# Patient Record
Sex: Male | Born: 1955
Health system: Southern US, Community
[De-identification: ages and names within clinical notes are randomized; demographics above are authoritative.]

## PROBLEM LIST (undated history)

## (undated) DIAGNOSIS — D573 Sickle-cell trait: Secondary | ICD-10-CM

## (undated) DIAGNOSIS — E7800 Pure hypercholesterolemia, unspecified: Secondary | ICD-10-CM

## (undated) DIAGNOSIS — C760 Malignant neoplasm of head, face and neck: Secondary | ICD-10-CM

## (undated) DIAGNOSIS — M21169 Varus deformity, not elsewhere classified, unspecified knee: Secondary | ICD-10-CM

## (undated) DIAGNOSIS — M1710 Unilateral primary osteoarthritis, unspecified knee: Secondary | ICD-10-CM

## (undated) DIAGNOSIS — Z720 Tobacco use: Secondary | ICD-10-CM

## (undated) DIAGNOSIS — I10 Essential (primary) hypertension: Secondary | ICD-10-CM

## (undated) DIAGNOSIS — M179 Osteoarthritis of knee, unspecified: Secondary | ICD-10-CM

## (undated) DIAGNOSIS — M1990 Unspecified osteoarthritis, unspecified site: Secondary | ICD-10-CM

## (undated) DIAGNOSIS — K625 Hemorrhage of anus and rectum: Secondary | ICD-10-CM

## (undated) DIAGNOSIS — Z923 Personal history of irradiation: Secondary | ICD-10-CM

## (undated) DIAGNOSIS — T7840XA Allergy, unspecified, initial encounter: Secondary | ICD-10-CM

## (undated) DIAGNOSIS — J449 Chronic obstructive pulmonary disease, unspecified: Secondary | ICD-10-CM

## (undated) DIAGNOSIS — K635 Polyp of colon: Secondary | ICD-10-CM

## (undated) DIAGNOSIS — K219 Gastro-esophageal reflux disease without esophagitis: Secondary | ICD-10-CM

## (undated) DIAGNOSIS — K621 Rectal polyp: Secondary | ICD-10-CM

## (undated) DIAGNOSIS — R05 Cough: Secondary | ICD-10-CM

## (undated) DIAGNOSIS — J329 Chronic sinusitis, unspecified: Secondary | ICD-10-CM

## (undated) DIAGNOSIS — R053 Chronic cough: Secondary | ICD-10-CM

## (undated) HISTORY — DX: Rectal polyp: K62.1

## (undated) HISTORY — DX: Cough: R05

## (undated) HISTORY — DX: Gastro-esophageal reflux disease without esophagitis: K21.9

## (undated) HISTORY — DX: Unilateral primary osteoarthritis, unspecified knee: M17.10

## (undated) HISTORY — DX: Polyp of colon: K63.5

## (undated) HISTORY — DX: Chronic obstructive pulmonary disease, unspecified: J44.9

## (undated) HISTORY — PX: COLONOSCOPY W/ BIOPSIES AND POLYPECTOMY: SHX1376

## (undated) HISTORY — DX: Allergy, unspecified, initial encounter: T78.40XA

## (undated) HISTORY — DX: Varus deformity, not elsewhere classified, unspecified knee: M21.169

## (undated) HISTORY — DX: Unspecified osteoarthritis, unspecified site: M19.90

## (undated) HISTORY — DX: Hemorrhage of anus and rectum: K62.5

## (undated) HISTORY — DX: Osteoarthritis of knee, unspecified: M17.9

## (undated) HISTORY — DX: Chronic cough: R05.3

## (undated) HISTORY — PX: MULTIPLE TOOTH EXTRACTIONS: SHX2053

## (undated) HISTORY — DX: Essential (primary) hypertension: I10

## (undated) HISTORY — DX: Tobacco use: Z72.0

---

## 2003-02-08 ENCOUNTER — Emergency Department (HOSPITAL_COMMUNITY): Admission: EM | Admit: 2003-02-08 | Discharge: 2003-02-08 | Payer: Self-pay | Admitting: Emergency Medicine

## 2003-03-31 ENCOUNTER — Ambulatory Visit (HOSPITAL_COMMUNITY): Admission: RE | Admit: 2003-03-31 | Discharge: 2003-03-31 | Payer: Self-pay | Admitting: Internal Medicine

## 2003-03-31 ENCOUNTER — Encounter: Admission: RE | Admit: 2003-03-31 | Discharge: 2003-03-31 | Payer: Self-pay | Admitting: Internal Medicine

## 2003-04-14 ENCOUNTER — Encounter: Admission: RE | Admit: 2003-04-14 | Discharge: 2003-04-14 | Payer: Self-pay | Admitting: Internal Medicine

## 2003-06-12 ENCOUNTER — Encounter: Admission: RE | Admit: 2003-06-12 | Discharge: 2003-06-12 | Payer: Self-pay | Admitting: Internal Medicine

## 2003-06-12 ENCOUNTER — Ambulatory Visit (HOSPITAL_COMMUNITY): Admission: RE | Admit: 2003-06-12 | Discharge: 2003-06-12 | Payer: Self-pay | Admitting: Internal Medicine

## 2003-06-19 ENCOUNTER — Encounter: Admission: RE | Admit: 2003-06-19 | Discharge: 2003-06-19 | Payer: Self-pay | Admitting: Internal Medicine

## 2005-06-23 ENCOUNTER — Emergency Department (HOSPITAL_COMMUNITY): Admission: EM | Admit: 2005-06-23 | Discharge: 2005-06-23 | Payer: Self-pay | Admitting: Emergency Medicine

## 2005-08-08 ENCOUNTER — Ambulatory Visit: Payer: Self-pay | Admitting: Internal Medicine

## 2005-08-14 ENCOUNTER — Ambulatory Visit: Admission: RE | Admit: 2005-08-14 | Discharge: 2005-08-14 | Payer: Self-pay | Admitting: Hospitalist

## 2005-08-14 ENCOUNTER — Ambulatory Visit: Payer: Self-pay | Admitting: Internal Medicine

## 2005-08-28 ENCOUNTER — Ambulatory Visit: Payer: Self-pay | Admitting: Internal Medicine

## 2005-12-28 LAB — CONVERTED CEMR LAB
HDL: 27 mg/dL
LDL Cholesterol: 124 mg/dL

## 2006-01-11 DIAGNOSIS — K219 Gastro-esophageal reflux disease without esophagitis: Secondary | ICD-10-CM

## 2006-01-11 DIAGNOSIS — IMO0002 Reserved for concepts with insufficient information to code with codable children: Secondary | ICD-10-CM | POA: Insufficient documentation

## 2006-01-11 DIAGNOSIS — M1710 Unilateral primary osteoarthritis, unspecified knee: Secondary | ICD-10-CM

## 2006-01-11 DIAGNOSIS — I10 Essential (primary) hypertension: Secondary | ICD-10-CM | POA: Insufficient documentation

## 2006-01-12 ENCOUNTER — Ambulatory Visit: Payer: Self-pay | Admitting: Internal Medicine

## 2006-01-19 DIAGNOSIS — J309 Allergic rhinitis, unspecified: Secondary | ICD-10-CM

## 2006-01-19 DIAGNOSIS — J4489 Other specified chronic obstructive pulmonary disease: Secondary | ICD-10-CM | POA: Insufficient documentation

## 2006-01-19 DIAGNOSIS — J449 Chronic obstructive pulmonary disease, unspecified: Secondary | ICD-10-CM

## 2006-01-19 DIAGNOSIS — K625 Hemorrhage of anus and rectum: Secondary | ICD-10-CM

## 2006-02-01 LAB — HM COLONOSCOPY

## 2006-03-14 ENCOUNTER — Encounter (INDEPENDENT_AMBULATORY_CARE_PROVIDER_SITE_OTHER): Payer: Self-pay | Admitting: Internal Medicine

## 2006-03-14 ENCOUNTER — Ambulatory Visit: Payer: Self-pay | Admitting: Internal Medicine

## 2006-03-14 DIAGNOSIS — K2990 Gastroduodenitis, unspecified, without bleeding: Secondary | ICD-10-CM

## 2006-03-14 DIAGNOSIS — D126 Benign neoplasm of colon, unspecified: Secondary | ICD-10-CM

## 2006-03-14 DIAGNOSIS — K2970 Gastritis, unspecified, without bleeding: Secondary | ICD-10-CM | POA: Insufficient documentation

## 2006-03-14 DIAGNOSIS — R0789 Other chest pain: Secondary | ICD-10-CM

## 2006-03-14 DIAGNOSIS — K089 Disorder of teeth and supporting structures, unspecified: Secondary | ICD-10-CM | POA: Insufficient documentation

## 2006-03-14 LAB — CONVERTED CEMR LAB
ALT: 12 units/L (ref 0–53)
AST: 17 units/L (ref 0–37)
Albumin: 4.7 g/dL (ref 3.5–5.2)
Alkaline Phosphatase: 67 units/L (ref 39–117)
BUN: 9 mg/dL (ref 6–23)
CO2: 25 meq/L (ref 19–32)
Calcium: 9.5 mg/dL (ref 8.4–10.5)
Chloride: 101 meq/L (ref 96–112)
Creatinine, Ser: 0.89 mg/dL (ref 0.40–1.50)
Glucose, Bld: 89 mg/dL (ref 70–99)
Lipase: 23 units/L (ref 0–75)
Potassium: 3.6 meq/L (ref 3.5–5.3)
Sodium: 141 meq/L (ref 135–145)
Total Bilirubin: 0.7 mg/dL (ref 0.3–1.2)
Total Protein: 7.8 g/dL (ref 6.0–8.3)

## 2006-03-21 ENCOUNTER — Ambulatory Visit: Payer: Self-pay | Admitting: Internal Medicine

## 2006-03-21 ENCOUNTER — Encounter (INDEPENDENT_AMBULATORY_CARE_PROVIDER_SITE_OTHER): Payer: Self-pay | Admitting: *Deleted

## 2006-03-21 LAB — CONVERTED CEMR LAB
BUN: 9 mg/dL (ref 6–23)
CO2: 26 meq/L (ref 19–32)
Calcium: 9.4 mg/dL (ref 8.4–10.5)
Chloride: 99 meq/L (ref 96–112)
Creatinine, Ser: 0.84 mg/dL (ref 0.40–1.50)
Glucose, Bld: 107 mg/dL — ABNORMAL HIGH (ref 70–99)
Potassium: 3.8 meq/L (ref 3.5–5.3)
Sodium: 140 meq/L (ref 135–145)

## 2006-03-27 ENCOUNTER — Encounter (INDEPENDENT_AMBULATORY_CARE_PROVIDER_SITE_OTHER): Payer: Self-pay | Admitting: Internal Medicine

## 2006-05-07 ENCOUNTER — Ambulatory Visit: Payer: Self-pay | Admitting: Internal Medicine

## 2006-05-07 DIAGNOSIS — F17200 Nicotine dependence, unspecified, uncomplicated: Secondary | ICD-10-CM | POA: Insufficient documentation

## 2006-07-31 ENCOUNTER — Encounter (INDEPENDENT_AMBULATORY_CARE_PROVIDER_SITE_OTHER): Payer: Self-pay | Admitting: Internal Medicine

## 2006-08-27 ENCOUNTER — Ambulatory Visit: Payer: Self-pay | Admitting: Hospitalist

## 2006-08-27 ENCOUNTER — Encounter (INDEPENDENT_AMBULATORY_CARE_PROVIDER_SITE_OTHER): Payer: Self-pay | Admitting: Internal Medicine

## 2006-08-27 LAB — CONVERTED CEMR LAB
BUN: 10 mg/dL (ref 6–23)
CO2: 24 meq/L (ref 19–32)
Calcium: 9.7 mg/dL (ref 8.4–10.5)
Chloride: 102 meq/L (ref 96–112)
Creatinine, Ser: 0.91 mg/dL (ref 0.40–1.50)
Glucose, Bld: 85 mg/dL (ref 70–99)
Potassium: 4.2 meq/L (ref 3.5–5.3)
Sodium: 139 meq/L (ref 135–145)

## 2006-11-29 ENCOUNTER — Ambulatory Visit: Payer: Self-pay | Admitting: Hospitalist

## 2006-11-29 ENCOUNTER — Encounter (INDEPENDENT_AMBULATORY_CARE_PROVIDER_SITE_OTHER): Payer: Self-pay | Admitting: Internal Medicine

## 2006-12-03 LAB — CONVERTED CEMR LAB
BUN: 7 mg/dL (ref 6–23)
CO2: 23 meq/L (ref 19–32)
Calcium: 9.2 mg/dL (ref 8.4–10.5)
Chloride: 106 meq/L (ref 96–112)
Creatinine, Ser: 0.83 mg/dL (ref 0.40–1.50)
Glucose, Bld: 98 mg/dL (ref 70–99)
HCT: 38.5 % — ABNORMAL LOW (ref 39.0–52.0)
Hemoglobin: 12.9 g/dL — ABNORMAL LOW (ref 13.0–17.0)
MCHC: 33.5 g/dL (ref 30.0–36.0)
MCV: 83.7 fL (ref 78.0–100.0)
Platelets: 282 10*3/uL (ref 150–400)
Potassium: 4.1 meq/L (ref 3.5–5.3)
RBC: 4.6 M/uL (ref 4.22–5.81)
RDW: 16.2 % — ABNORMAL HIGH (ref 11.5–14.0)
Sodium: 142 meq/L (ref 135–145)
WBC: 5.2 10*3/uL (ref 4.0–10.5)

## 2006-12-21 ENCOUNTER — Encounter (INDEPENDENT_AMBULATORY_CARE_PROVIDER_SITE_OTHER): Payer: Self-pay | Admitting: Internal Medicine

## 2006-12-21 ENCOUNTER — Ambulatory Visit: Payer: Self-pay | Admitting: *Deleted

## 2006-12-21 LAB — CONVERTED CEMR LAB
BUN: 11 mg/dL (ref 6–23)
CO2: 26 meq/L (ref 19–32)
Calcium: 10.1 mg/dL (ref 8.4–10.5)
Chloride: 104 meq/L (ref 96–112)
Creatinine, Ser: 0.97 mg/dL (ref 0.40–1.50)
Glucose, Bld: 96 mg/dL (ref 70–99)
Potassium: 3.9 meq/L (ref 3.5–5.3)
Sodium: 143 meq/L (ref 135–145)

## 2007-03-15 ENCOUNTER — Ambulatory Visit: Payer: Self-pay | Admitting: Infectious Disease

## 2007-03-15 ENCOUNTER — Encounter (INDEPENDENT_AMBULATORY_CARE_PROVIDER_SITE_OTHER): Payer: Self-pay | Admitting: Internal Medicine

## 2007-03-18 LAB — CONVERTED CEMR LAB
Creatinine, Urine: 105.6 mg/dL
Microalb Creat Ratio: 4.5 mg/g (ref 0.0–30.0)
Microalb, Ur: 0.48 mg/dL (ref 0.00–1.89)

## 2007-03-19 ENCOUNTER — Telehealth: Payer: Self-pay | Admitting: Licensed Clinical Social Worker

## 2007-05-10 ENCOUNTER — Ambulatory Visit: Payer: Self-pay | Admitting: Hospitalist

## 2007-05-10 ENCOUNTER — Encounter (INDEPENDENT_AMBULATORY_CARE_PROVIDER_SITE_OTHER): Payer: Self-pay | Admitting: Internal Medicine

## 2007-05-13 ENCOUNTER — Encounter: Payer: Self-pay | Admitting: Licensed Clinical Social Worker

## 2007-05-28 ENCOUNTER — Encounter (INDEPENDENT_AMBULATORY_CARE_PROVIDER_SITE_OTHER): Payer: Self-pay | Admitting: Internal Medicine

## 2007-05-28 ENCOUNTER — Ambulatory Visit: Payer: Self-pay | Admitting: Internal Medicine

## 2007-05-28 LAB — CONVERTED CEMR LAB
BUN: 9 mg/dL (ref 6–23)
CO2: 31 meq/L (ref 19–32)
Calcium: 9.6 mg/dL (ref 8.4–10.5)
Chloride: 99 meq/L (ref 96–112)
Creatinine, Ser: 0.91 mg/dL (ref 0.40–1.50)
Glucose, Bld: 89 mg/dL (ref 70–99)
Potassium: 3.8 meq/L (ref 3.5–5.3)
Sodium: 138 meq/L (ref 135–145)

## 2007-06-20 ENCOUNTER — Ambulatory Visit: Payer: Self-pay | Admitting: Infectious Disease

## 2008-11-03 ENCOUNTER — Ambulatory Visit: Payer: Self-pay | Admitting: Internal Medicine

## 2008-11-03 ENCOUNTER — Encounter: Payer: Self-pay | Admitting: Internal Medicine

## 2008-11-03 DIAGNOSIS — L02828 Furuncle of other sites: Secondary | ICD-10-CM

## 2008-11-03 DIAGNOSIS — L02838 Carbuncle of other sites: Secondary | ICD-10-CM

## 2008-11-03 LAB — CONVERTED CEMR LAB
BUN: 8 mg/dL (ref 6–23)
CO2: 27 meq/L (ref 19–32)
Calcium: 9.5 mg/dL (ref 8.4–10.5)
Chloride: 100 meq/L (ref 96–112)
Cholesterol: 200 mg/dL (ref 0–200)
Creatinine, Ser: 1.07 mg/dL (ref 0.40–1.50)
Glucose, Bld: 90 mg/dL (ref 70–99)
HDL: 68 mg/dL (ref >39)
LDL Cholesterol: 116 mg/dL — ABNORMAL HIGH (ref 0–99)
Potassium: 3.6 meq/L (ref 3.5–5.3)
Sodium: 139 meq/L (ref 135–145)
Total CHOL/HDL Ratio: 2.9
Triglycerides: 81 mg/dL (ref <150)
VLDL: 16 mg/dL (ref 0–40)

## 2008-11-30 ENCOUNTER — Telehealth (INDEPENDENT_AMBULATORY_CARE_PROVIDER_SITE_OTHER): Payer: Self-pay | Admitting: *Deleted

## 2009-06-02 ENCOUNTER — Ambulatory Visit: Payer: Self-pay | Admitting: Internal Medicine

## 2009-06-02 LAB — CONVERTED CEMR LAB

## 2009-09-02 ENCOUNTER — Telehealth: Payer: Self-pay | Admitting: Internal Medicine

## 2009-12-24 ENCOUNTER — Ambulatory Visit: Payer: Self-pay | Admitting: Internal Medicine

## 2009-12-24 DIAGNOSIS — M1990 Unspecified osteoarthritis, unspecified site: Secondary | ICD-10-CM | POA: Insufficient documentation

## 2010-02-24 LAB — CONVERTED CEMR LAB
BUN: 12 mg/dL (ref 6–23)
CO2: 27 meq/L (ref 19–32)
Calcium: 9.4 mg/dL (ref 8.4–10.5)
Chloride: 99 meq/L (ref 96–112)
Cholesterol: 240 mg/dL — ABNORMAL HIGH (ref 0–200)
Creatinine, Ser: 1.12 mg/dL (ref 0.40–1.50)
Glucose, Bld: 91 mg/dL (ref 70–99)
HDL: 61 mg/dL (ref >39)
LDL Cholesterol: 147 mg/dL — ABNORMAL HIGH (ref 0–99)
Potassium: 4 meq/L (ref 3.5–5.3)
Sodium: 138 meq/L (ref 135–145)
Total CHOL/HDL Ratio: 3.9
Triglycerides: 161 mg/dL — ABNORMAL HIGH (ref <150)
VLDL: 32 mg/dL (ref 0–40)

## 2010-02-25 ENCOUNTER — Ambulatory Visit
Admission: RE | Admit: 2010-02-25 | Discharge: 2010-02-25 | Payer: Self-pay | Source: Home / Self Care | Attending: Internal Medicine | Admitting: Internal Medicine

## 2010-03-01 ENCOUNTER — Telehealth: Payer: Self-pay | Admitting: *Deleted

## 2010-03-15 NOTE — Assessment & Plan Note (Signed)
Summary: est-ck/fu/meds/cfb   Vital Signs:  Patient profile:   55 year old male Height:      63.25 inches Weight:      180.3 pounds BMI:     31.80 Temp:     97.2 degrees F oral Pulse rate:   88 / minute BP sitting:   129 / 88  (right arm)  Vitals Entered By: Filomena Jungling NT II (December 24, 2009 9:39 AM) CC: FOLLOWUPVISIT Is Patient Diabetic? No Pain Assessment Patient in pain? yes     Location: bilaterleg pain Intensity: 9-10 Type: aching Nutritional Status BMI of > 30 = obese  Have you ever been in a relationship where you felt threatened, hurt or afraid?No   Does patient need assistance? Functional Status Self care Ambulation Impaired:Risk for fall Comments WALKS WITH CANE   Primary Care Provider:  Jackson Latino MD  CC:  FOLLOWUPVISIT.  History of Present Illness: Pt is a 55 y/o with PMH of Hypertension, OA, GERD comes in for regular f/u. He has bilater knee pain, 5/10, worse in his right knee, bracing helps. But tylenol seems not very helpful. No diarrhea or dysuria, melena, hematochezia.  Current smoker, about 1 pack per week, no recent drink or drug use.   Preventive Screening-Counseling & Management  Alcohol-Tobacco     Alcohol drinks/day: <1     Alcohol type: beer     Smoking Status: current     Smoking Cessation Counseling: yes     Packs/Day: 1pk/wk     Year Started: 1976  Caffeine-Diet-Exercise     Does Patient Exercise: no  Problems Prior to Update: 1)  Carbuncle and Furuncle of Other Specified Sites  (ICD-680.8) 2)  Preventive Health Care  (ICD-V70.0) 3)  Inadequate Material Resources  (ICD-V60.2) 4)  Tobacco Abuse  (ICD-305.1) 5)  Abdominal Tenderness  (ICD-789.60) 6)  Chest Pain, Non-cardiac  (ICD-786.59) 7)  Dental Disorder  (ICD-525.9) 8)  Colonic Polyps  (ICD-211.3) 9)  Gastritis  (ICD-535.50) 10)  Rectal Bleeding  (ICD-569.3) 11)  Dysphagia, Unspecified  (ICD-787.20) 12)  COPD  (ICD-496) 13)  Allergic Rhinitis  (ICD-477.9) 14)   Degenerative Joint Disease, Knees, Bilateral  (ICD-715.96) 15)  Cough, Chronic  (ICD-786.2) 16)  Hypertension  (ICD-401.9) 17)  Gerd  (ICD-530.81)  Medications Prior to Update: 1)  Hydrochlorothiazide 25 Mg Tabs (Hydrochlorothiazide) .... Take 1 Tablet By Mouth Once A Day 2)  Omeprazole 20 Mg  Tbec (Omeprazole) .... Take 1 Tablet By Mouth Once A Day 3)  Proair Hfa 108 (90 Base) Mcg/act  Aers (Albuterol Sulfate) .... Take 1-2 Puffs Every 4-6 Hours As Needed For Shortness of Breath 4)  Claritin-D 24 Hour 10-240 Mg  Tb24 (Loratadine-Pseudoephedrine) .... Take 1 Tablet By Mouth Once A Day 5)  Lisinopril 20 Mg Tabs (Lisinopril) .... Take 1 Tablet By Mouth Once A Day  Current Medications (verified): 1)  Hydrochlorothiazide 25 Mg Tabs (Hydrochlorothiazide) .... Take 1 Tablet By Mouth Once A Day 2)  Omeprazole 20 Mg  Tbec (Omeprazole) .... Take 1 Tablet By Mouth Once A Day 3)  Proair Hfa 108 (90 Base) Mcg/act  Aers (Albuterol Sulfate) .... Take 1-2 Puffs Every 4-6 Hours As Needed For Shortness of Breath 4)  Claritin-D 24 Hour 10-240 Mg  Tb24 (Loratadine-Pseudoephedrine) .... Take 1 Tablet By Mouth Once A Day 5)  Lisinopril 20 Mg Tabs (Lisinopril) .... Take 1 Tablet By Mouth Once A Day  Allergies (verified): No Known Drug Allergies  Past History:  Past Medical History: Last updated: 03/15/2007  GERD Hypertension Chronic cough DJD- of knees bilaterally Allergic rhinitis Rectal bleeding - colonoscopy in 12/07 COPD Dysphagia -Normal EGD except for mild gastritis in 12/07 rectal polyp 12/07 with path showing it to be benign, though with a question that it was an incomplete biopsy.  Next scheduled colonoscopy 2010.  Social History: Reviewed history from 05/10/2007 and no changes required. Current Smoker - trying to quit Alcohol use-yes Drug use-no Occupation: unemployed, currently awaiting disability (3/09)  Review of Systems  The patient denies fever, chest pain, syncope, dyspnea on  exertion, peripheral edema, prolonged cough, abdominal pain, melena, and hematochezia.    Physical Exam  General:  alert, well-developed, well-nourished, well-hydrated, and overweight-appearing.   Nose:  no nasal discharge.   Mouth:  pharynx pink and moist.   Neck:  supple.   Lungs:  normal respiratory effort, normal breath sounds, no crackles, and no wheezes.   Heart:  normal rate, no murmur, and no JVD.   Abdomen:  soft, non-tender, normal bowel sounds, and no distention.   Msk:  normal ROM, no joint swelling, and no joint warmth. Both knee slighly tenderness, particularly  right knee.  Pulses:  2+ Extremities:  No edema.  Neurologic:  alert & oriented X3, cranial nerves II-XII intact, strength normal in all extremities, and gait normal.     Impression & Recommendations:  Problem # 1:  OSTEOARTHRITIS (ICD-715.90) Assessment Improved His knee pain is better with bracing and tylenol not very helpful. So will try low dose of naproxen.  Also advise exercise and heating/icy pad.   His updated medication list for this problem includes:    Naproxen 250 Mg Tab (Naproxen) .Marland Kitchen... Take one (1) tablet by mouth three (3) times a day with food as needed for your knee pain  Problem # 2:  HYPERTENSION (ICD-401.9) Assessment: Improved BP at goal and continue current meds and refill his lisinopril.  His updated medication list for this problem includes:    Hydrochlorothiazide 25 Mg Tabs (Hydrochlorothiazide) .Marland Kitchen... Take 1 tablet by mouth once a day    Lisinopril 20 Mg Tabs (Lisinopril) .Marland Kitchen... Take 1 tablet by mouth once a day  BP today: 129/88 Prior BP: 138/88 (06/02/2009)  Labs Reviewed: K+: 3.6 (11/03/2008) Creat: : 1.07 (11/03/2008)   Chol: 200 (11/03/2008)   HDL: 68 (11/03/2008)   LDL: 116 (11/03/2008)   TG: 81 (11/03/2008)  Problem # 3:  TOBACCO ABUSE (ICD-305.1) Assessment: Comment Only  Encouraged smoking cessation and discussed different methods for smoking cessation. He  would like  to cut gradually.   Problem # 4:  COPD (ICD-496) Assessment: Unchanged No SOB and use albuterol occasionally. No flare.  His updated medication list for this problem includes:    Proair Hfa 108 (90 Base) Mcg/act Aers (Albuterol sulfate) .Marland Kitchen... Take 1-2 puffs every 4-6 hours as needed for shortness of breath  Vaccines Reviewed: Flu Vax: Fluvax Non-MCR (12/21/2006)  Complete Medication List: 1)  Hydrochlorothiazide 25 Mg Tabs (Hydrochlorothiazide) .... Take 1 tablet by mouth once a day 2)  Omeprazole 20 Mg Tbec (Omeprazole) .... Take 1 tablet by mouth once a day 3)  Proair Hfa 108 (90 Base) Mcg/act Aers (Albuterol sulfate) .... Take 1-2 puffs every 4-6 hours as needed for shortness of breath 4)  Claritin-d 24 Hour 10-240 Mg Tb24 (Loratadine-pseudoephedrine) .... Take 1 tablet by mouth once a day 5)  Lisinopril 20 Mg Tabs (Lisinopril) .... Take 1 tablet by mouth once a day 6)  Naproxen 250 Mg Tab (Naproxen) .... Take one (1) tablet  by mouth three (3) times a day with food as needed for your knee pain  Other Orders: Influenza Vaccine MCR (16109)  Patient Instructions: 1)  Please schedule a follow-up appointment in 3-4 months. 2)  Tobacco is very bad for your health and your loved ones! You Should stop smoking!. 3)  Stop Smoking Tips: Choose a Quit date. Cut down before the Quit date. decide what you will do as a substitute when you feel the urge to smoke(gum,toothpick,exercise). 4)  It is important that you exercise regularly at least 20 minutes 5 times a week. If you develop chest pain, have severe difficulty breathing, or feel very tired , stop exercising immediately and seek medical attention. Prescriptions: LISINOPRIL 20 MG TABS (LISINOPRIL) Take 1 tablet by mouth once a day  #30 Tablet x 5   Entered and Authorized by:   Jackson Latino MD   Signed by:   Jackson Latino MD on 12/24/2009   Method used:   [facility] to        [facility]  [address] [phone]* (retail)       [address]       [place], [place]  [id number]       Ph: [phone] or [phone]       Fax: [phone]   RxID:   [id number] NAPROXEN 250 MG TAB (NAPROXEN) Take one (1) tablet by mouth three (3) times a day with food as needed for your knee pain  #60 x 2   Entered and Authorized by:   Jackson Latino MD   Signed by:   Jackson Latino MD on 12/24/2009   Method used:   [facility]        [facility]  [address] [phone]* (retail)       [address]       [place], [place]  [id number]       Ph: [phone] or [phone]       Fax: [phone]   RxID:   [id number]    Orders Added: 1)  Est. Patient Level III [51761] 2)  Influenza Vaccine MCR [00025]   Immunizations Administered:  Influenza Vaccine # 1:    Vaccine Type: Fluvax MCR    Site: right deltoid    Mfr: GlaxoSmithKline    Dose: 0.5 ml    Route: IM    Given by: Angelina Ok RN    Exp. Date: 08/13/2010    Lot #: [id number]    VIS given: 09/07/09 version given December 24, 2009.  Flu Vaccine Consent Questions:    Do you have a history of severe allergic reactions to this vaccine? no    Any prior history of allergic reactions to egg and/or gelatin? no    Do you have a sensitivity to the preservative Thimersol? no    Do you have a past history of Guillan-Barre Syndrome? no    Do you currently have an acute febrile illness? no    Have you ever had a severe reaction to latex? no    Vaccine information given and explained to patient? yes   Immunizations Administered:  Influenza Vaccine # 1:    Vaccine Type: Fluvax MCR    Site: right deltoid    Mfr: GlaxoSmithKline    Dose: 0.5 ml    Route: IM    Given by: Angelina Ok RN    Exp. Date: 08/13/2010    Lot #:  [id number]    VIS given: 09/07/09 version given December 24, 2009.  Prevention & Chronic Care Immunizations   Influenza vaccine: Fluvax MCR  (12/24/2009)   Influenza vaccine deferral: Not  indicated  (06/02/2009)    Tetanus booster: Not documented   Td booster deferral: Not indicated  (06/02/2009)    Pneumococcal vaccine: Not documented  Colorectal Screening   Hemoccult: Not documented   Hemoccult action/deferral: Refused  (12/24/2009)    Colonoscopy: Adenomatous Polyp  (02/01/2006)   Colonoscopy due: 02/02/2011  Other Screening   PSA: Not documented   PSA action/deferral: Discussed-PSA declined  (06/02/2009)   Smoking status: current  (12/24/2009)   Smoking cessation counseling: yes  (12/24/2009)  Lipids   Total Cholesterol: 200  (11/03/2008)   Lipid panel action/deferral: Lipid Panel ordered   LDL: 116  (11/03/2008)   LDL Direct: Not documented   HDL: 68  (11/03/2008)   Triglycerides: 81  (11/03/2008)  Hypertension   Last Blood Pressure: 129 / 88  (12/24/2009)   Serum creatinine: 1.07  (11/03/2008)   BMP action: Ordered   Serum potassium 3.6  (11/03/2008)    Hypertension flowsheet reviewed?: Yes   Progress toward BP goal: Improved  Self-Management Support :   Personal Goals (by the next clinic visit) :      Personal blood pressure goal: 140/90  (11/03/2008)   Patient will work on the following items until the next clinic visit to reach self-care goals:     Medications and monitoring: take my medicines every day  (12/24/2009)     Eating: eat more vegetables, use fresh or frozen vegetables, eat foods that are low in salt, eat baked foods instead of fried foods  (12/24/2009)     Activity: take a 30 minute walk every day  (12/24/2009)    Hypertension self-management support: Education handout  (12/24/2009)   Hypertension education handout printed   Nursing Instructions: Give Flu vaccine today

## 2010-03-15 NOTE — Progress Notes (Signed)
Summary: med refill/gp  Phone Note Refill Request Message from:  Fax from Pharmacy on September 02, 2009 10:14 AM  Refills Requested: Medication #1:  HYDROCHLOROTHIAZIDE 25 MG TABS Take 1 tablet by mouth once a day   Dosage confirmed as above?Dosage Confirmed   Last Refilled: 05/31/2009 Last appt. 06/02/09.   Method Requested: Electronic Initial call taken by: Chinita Pester RN,  September 02, 2009 10:14 AM  Follow-up for Phone Call        Rx completed in Dr. Tiajuana Amass Follow-up by: Jackson Latino MD,  September 02, 2009 6:19 PM    Prescriptions: HYDROCHLOROTHIAZIDE 25 MG TABS (HYDROCHLOROTHIAZIDE) Take 1 tablet by mouth once a day  #90 Tablet x 2   Entered and Authorized by:   Jackson Latino MD   Signed by:   Jackson Latino MD on 09/02/2009   Method used:   [facility]  [address] [phone]* (retail)       [address]       [place], [place]  [id number]       Ph: [phone] or [phone]       Fax: [phone]   RxID:   [id number]

## 2010-03-15 NOTE — Assessment & Plan Note (Signed)
Summary: out of meds(yang)/cfb   Vital Signs:  Patient profile:   55 year old Antonio [name] Height:      63.25 inches (160.66 cm) Weight:      180.4 pounds (82 kg) BMI:     31.82 Temp:     98.6 degrees F (37.00 degrees C) oral Pulse rate:   96 / minute BP sitting:   138 / 88  (right arm)  Vitals Entered By: Stanton Kidney Ditzler RN (June 02, 2009 1:32 PM) Is Patient Diabetic? No Pain Assessment Patient in pain? yes     Location: legs Intensity: 7 Type: sharp Onset of pain  long time Nutritional Status BMI of > 30 = obese Nutritional Status Detail appetite good  Have you ever been in a relationship where you felt threatened, hurt or afraid?denies   Does patient need assistance? Functional Status Self care Ambulation Impaired:Risk for fall Comments Uses a cane - wears braces on knees. Refills on meds and inhaler.   Primary Care Provider:  Jackson Latino MD   History of Present Illness: 55 yo Antonio [name] with PMH outlined below presents to [facility] for regular follow up appointment. He has no concerns at the time. No recent sicknesses or hospitalizaitons. No episodes of chest pain, SOB, palpitations. No specific abdominal or urinary concerns. No recent changes in appetite, weight, sleep patterns, mood. Needs refill on his meds.   Depression History:      The patient denies a depressed mood most of the day and a diminished interest in his usual daily activities.  The patient denies significant weight loss, significant weight gain, insomnia, hypersomnia, psychomotor agitation, psychomotor retardation, fatigue (loss of energy), feelings of worthlessness (guilt), impaired concentration (indecisiveness), and recurrent thoughts of death or suicide.        The patient denies that he feels like life is not worth living, denies that he wishes that he were dead, and denies that he has thought about ending his life.         Preventive Screening-Counseling & Management  Alcohol-Tobacco     Alcohol  drinks/day: <1     Alcohol type: beer     Smoking Status: current     Smoking Cessation Counseling: yes     Packs/Day: 1pk/wk     Year Started: 1975  Caffeine-Diet-Exercise     Does Patient Exercise: no  Problems Prior to Update: 1)  Carbuncle and Furuncle of Other Specified Sites  (ICD-680.8) 2)  Preventive Health Care  (ICD-V70.0) 3)  Inadequate Material Resources  (ICD-V60.2) 4)  Tobacco Abuse  (ICD-305.1) 5)  Abdominal Tenderness  (ICD-789.60) 6)  Chest Pain, Non-cardiac  (ICD-786.59) 7)  Dental Disorder  (ICD-525.9) 8)  Colonic Polyps  (ICD-211.3) 9)  Gastritis  (ICD-535.50) 10)  Rectal Bleeding  (ICD-569.3) 11)  Dysphagia, Unspecified  (ICD-787.20) 12)  COPD  (ICD-496) 13)  Allergic Rhinitis  (ICD-477.9) 14)  Degenerative Joint Disease, Knees, Bilateral  (ICD-715.96) 15)  Cough, Chronic  (ICD-786.2) 16)  Hypertension  (ICD-401.9) 17)  Gerd  (ICD-530.81)  Medications Prior to Update: 1)  Hydrochlorothiazide 25 Mg Tabs (Hydrochlorothiazide) .... Take 1 Tablet By Mouth Once A Day 2)  Omeprazole 20 Mg  Tbec (Omeprazole) .... Take 1 Tablet By Mouth Once A Day 3)  Tylenol  Tabs (Acetaminophen Tabs) .... Use As Directed For Knee Pain 4)  Proair Hfa 108 (90 Base) Mcg/act  Aers (Albuterol Sulfate) .... Take 1-2 Puffs Every 4-6 Hours As Needed For Shortness of Breath 5)  Claritin-D 24 Hour 10-240 Mg  Tb24 (Loratadine-Pseudoephedrine) .... Take 1 Tablet By Mouth Once A Day 6)  Lisinopril 10 Mg  Tabs (Lisinopril) .... Take 1 Tablet By Mouth Once A Day 7)  Bactrim Ds 800-160 Mg Tabs (Sulfamethoxazole-Trimethoprim) .... Take 1 Tablet By Mouth Two Times A Day For 7 Days  Current Medications (verified): 1)  Hydrochlorothiazide 25 Mg Tabs (Hydrochlorothiazide) .... Take 1 Tablet By Mouth Once A Day 2)  Omeprazole 20 Mg  Tbec (Omeprazole) .... Take 1 Tablet By Mouth Once A Day 3)  Proair Hfa 108 (90 Base) Mcg/act  Aers (Albuterol Sulfate) .... Take 1-2 Puffs Every 4-6 Hours As Needed For  Shortness of Breath 4)  Claritin-D 24 Hour 10-240 Mg  Tb24 (Loratadine-Pseudoephedrine) .... Take 1 Tablet By Mouth Once A Day 5)  Lisinopril 10 Mg  Tabs (Lisinopril) .... Take 1 Tablet By Mouth Once A Day  Allergies (verified): No Known Drug Allergies  Past History:  Past Medical History: Last updated: 03/15/2007 GERD Hypertension Chronic cough DJD- of knees bilaterally Allergic rhinitis Rectal bleeding - colonoscopy in 12/07 COPD Dysphagia -Normal EGD except for mild gastritis in 12/07 rectal polyp 12/07 with path showing it to be benign, though with a question that it was an incomplete biopsy.  Next scheduled colonoscopy 2010.  Social History: Last updated: 05/10/2007 Current Smoker - trying to quit Alcohol use-yes Drug use-no Occupation: unemployed, currently awaiting disability (3/09)  Risk Factors: Alcohol Use: <1 (06/02/2009) Exercise: no (06/02/2009)  Risk Factors: Smoking Status: current (06/02/2009) Packs/Day: 1pk/wk (06/02/2009)  Social History: Reviewed history from 05/10/2007 and no changes required. Current Smoker - trying to quit Alcohol use-yes Drug use-no Occupation: unemployed, currently awaiting disability (3/09)  Review of Systems       per HPI   Physical Exam  General:  Well-developed,well-nourished,in no acute distress; alert,appropriate and cooperative throughout examination Lungs:  Normal respiratory effort, chest expands symmetrically. Lungs are clear to auscultation, no crackles or wheezes. Heart:  Normal rate and regular rhythm. S1 and S2 normal without gallop, murmur, click, rub or other extra sounds. Abdomen:  Bowel sounds positive,abdomen soft and non-tender without masses, organomegaly or hernias noted. Neurologic:  No cranial nerve deficits noted. Station and gait are normal. Plantar reflexes are down-going bilaterally. DTRs are symmetrical throughout. Sensory, motor and coordinative functions appear intact. Psych:  Cognition and  judgment appear intact. Alert and cooperative with normal attention span and concentration. No apparent delusions, illusions, hallucinations   Impression & Recommendations:  Problem # 1:  TOBACCO ABUSE (ICD-305.1)  Encouraged smoking cessation and discussed different methods for smoking cessation.   Problem # 2:  HYPERTENSION (ICD-401.9) Still somewhat above the goal and will increase the dose on lisinopril from 10 mg to 20 mg once daily. His updated medication list for this problem includes:    Hydrochlorothiazide 25 Mg Tabs (Hydrochlorothiazide) .Marland Kitchen... Take 1 tablet by mouth once a day    Lisinopril 20 Mg Tabs (Lisinopril) .Marland Kitchen... Take 1 tablet by mouth once a day  BP today: 138/88 Prior BP: 135/78 (11/03/2008)  Labs Reviewed: K+: 3.6 (11/03/2008) Creat: : 1.07 (11/03/2008)   Chol: 200 (11/03/2008)   HDL: 68 (11/03/2008)   LDL: 116 (11/03/2008)   TG: 81 (11/03/2008)  Problem # 3:  GERD (ICD-530.81) Continue the same regimen since well controlled.  His updated medication list for this problem includes:    Omeprazole 20 Mg Tbec (Omeprazole) .Marland Kitchen... Take 1 tablet by mouth once a day  EGD: negative (01/24/2006)  Labs Reviewed: Hgb: 12.9 (11/29/2006)   Hct: 38.5 (  11/29/2006)  Problem # 4:  COPD (ICD-496) Xont inh as perscribed and follow up.  His updated medication list for this problem includes:    Proair Hfa 108 (90 Base) Mcg/act Aers (Albuterol sulfate) .Marland Kitchen... Take 1-2 puffs every 4-6 hours as needed for shortness of breath  Vaccines Reviewed: Flu Vax: Fluvax Non-MCR (12/21/2006)  Complete Medication List: 1)  Hydrochlorothiazide 25 Mg Tabs (Hydrochlorothiazide) .... Take 1 tablet by mouth once a day 2)  Omeprazole 20 Mg Tbec (Omeprazole) .... Take 1 tablet by mouth once a day 3)  Proair Hfa 108 (90 Base) Mcg/act Aers (Albuterol sulfate) .... Take 1-2 puffs every 4-6 hours as needed for shortness of breath 4)  Claritin-d 24 Hour 10-240 Mg Tb24 (Loratadine-pseudoephedrine) ....  Take 1 tablet by mouth once a day 5)  Lisinopril 20 Mg Tabs (Lisinopril) .... Take 1 tablet by mouth once a day  Patient Instructions: 1)  Please schedule a follow-up appointment in 6 months. 2)  Please check your blood pressure regularly, if it is >170 please call clinic at [phone] 3)  Cigarette smoking is estimated to be responsible for over five million premature deaths worldwide, making it the leading preventable cause of death. The most important causes of smoking-related mortality include atherosclerotic cardiovascular disease, lung cancer, and chronic obstructive pulmonary disease (COPD).  4)  Tobacco is very bad for your health and your loved ones! You Should stop smoking!. 5)  Stop Smoking Tips: Choose a Quit date. Cut down before the Quit date. decide what you will do as a substitute when you feel the urge to smoke(gum,toothpick,exercise). Prescriptions: CLARITIN-D 24 HOUR 10-240 MG  TB24 (LORATADINE-PSEUDOEPHEDRINE) Take 1 tablet by mouth once a day  #31 x 2   Entered and Authorized by:   Mliss Sax MD   Signed by:   Mliss Sax MD on 06/02/2009   Method used:   Electronically to        [facility]  [address] [phone]* (retail)       [address]       [place], [place]  [id number]       Ph: [phone] or [phone]       Fax: [phone]   RxID:   [id number] PROAIR HFA 108 (90 BASE) MCG/ACT  AERS (ALBUTEROL SULFATE) take 1-2 puffs every 4-6 hours as needed for shortness of breath  #1 inhaler x 1   Entered and Authorized by:   Mliss Sax MD   Signed by:   Mliss Sax MD on 06/02/2009   Method used:   [facility]        [facility]  [address] [phone]* (retail)       [address]       [place], [place]  [id number]       Ph: [phone] or [phone]       Fax: [phone]   RxID:   [id number] OMEPRAZOLE 20 MG  TBEC (OMEPRAZOLE) Take 1 tablet by mouth once a day  #31 x 1   Entered and Authorized by:    Mliss Sax MD   Signed by:   Mliss Sax MD on 06/02/2009   Method used:   [facility]        [facility]  [address] [phone]* (retail)       [address]       [place], [place]  [address]       Ph: [phone] or [phone]       Fax: [phone]   RxID:   [id number] HYDROCHLOROTHIAZIDE 25 MG TABS (HYDROCHLOROTHIAZIDE) Take 1 tablet by mouth once a day  #90 Tablet x 2   Entered and Authorized by:   Mliss Sax MD   Signed by:   Mliss Sax MD on 06/02/2009   Method used:   [facility]        [facility] [phone]* (retail)       [address]       [place], [place]  [address]       Ph: [phone] or [phone]       Fax: [phone]   RxID:   [id number] LISINOPRIL 20 MG TABS (LISINOPRIL) Take 1 tablet by mouth once a day  #30 x 3   Entered and Authorized by:   Mliss Sax MD   Signed by:   Mliss Sax MD on 06/02/2009   Method used:   [facility]        [facility] [address] [phone]* (retail)       [address]       [place], [place]  [id number]       Ph: [phone] or [phone]       Fax: [phone]   RxID:   [id number]    Prevention & Chronic Care Immunizations   Influenza vaccine: Fluvax Non-MCR  (12/21/2006)   Influenza vaccine deferral: Not indicated  (06/02/2009)    Tetanus booster: Not documented   Td booster deferral: Not indicated  (06/02/2009)    Pneumococcal vaccine: Not documented  Colorectal Screening   Hemoccult: Not documented   Hemoccult action/deferral: Deferred  (06/02/2009)    Colonoscopy: Adenomatous Polyp  (02/01/2006)   Colonoscopy due: 02/02/2011  Other Screening   PSA: Not documented   PSA action/deferral: Discussed-PSA declined  (06/02/2009)   Smoking status: current  (06/02/2009)   Smoking cessation counseling: yes  (06/02/2009)  Lipids   Total Cholesterol: 200  (11/03/2008)   Lipid panel  action/deferral: Lipid Panel ordered   LDL: 116  (11/03/2008)   LDL Direct: Not documented   HDL: 68  (11/03/2008)   Triglycerides: 81  (11/03/2008)  Hypertension   Last Blood Pressure: 138 / 88  (06/02/2009)   Serum creatinine: 1.07  (11/03/2008)   BMP action: Ordered   Serum potassium 3.6  (11/03/2008)    Hypertension flowsheet reviewed?: Yes   Progress toward BP goal: Unchanged  Self-Management Support :   Personal Goals (by the next clinic visit) :      Personal blood pressure goal: 140/90  (11/03/2008)   Patient will work on the following items until the next clinic visit to reach self-care goals:     Medications and monitoring: take my medicines every day  (06/02/2009)     Eating: drink diet soda or water instead of juice or soda, eat more vegetables, use fresh or frozen vegetables, eat foods that are low in salt, eat fruit for snacks and desserts  (06/02/2009)     Activity: take a 30 minute walk every day, park at the far end of the parking lot  (06/02/2009)    Hypertension self-management support: Written self-care plan, Education handout, Resources for patients handout  (06/02/2009)   Hypertension self-care plan printed.   Hypertension education handout printed  Resource handout printed.

## 2010-03-17 NOTE — Assessment & Plan Note (Signed)
Summary: PATIENT LEG HURTS/SB.   Vital Signs:  Patient profile:   55 year old male Height:      63.25 inches (160.66 cm) Weight:      181.7 pounds (81.95 kg) BMI:     31.80 Temp:     97.3 degrees F (36.28 degrees C) oral Pulse rate:   58 / minute BP sitting:   132 / 84  (right arm) Cuff size:   regular  Vitals Entered By: Theotis Barrio NT II (February 24, 2010 8:41 AM) CC: LEFT LEG PAIN FOR ABOUT 2-3 WEEKS. PAIN STARTED AT HIP.  /  NO OTHER CONCERNS AT THIS TIME. Is Patient Diabetic? No Pain Assessment Patient in pain? yes     Location: LEFT LEG Intensity:          10 Type:      HEAVY Onset of pain  ABOUT 2-3 WEEKS AGO Nutritional Status BMI of > 30 = obese  Have you ever been in a relationship where you felt threatened, hurt or afraid?No   Does patient need assistance? Functional Status Self care Ambulation Normal   Primary Care Provider:  Jackson Latino MD  CC:  LEFT LEG PAIN FOR ABOUT 2-3 WEEKS. PAIN STARTED AT HIP.  /  NO OTHER CONCERNS AT THIS TIME.Marland Kitchen  History of Present Illness: presents with left LE pain that started 2 weeks ago -"just woke up with it." Pain strated at the greater trochanter area and then expanded down lateral aspect of LE down to his ankle. 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 he denies any new trauma, no fever, chillls or waking up at night. Pain is worse while being seated and "so bad that can hardly walk." Describes as sharp, intermittent 10/10 in intensity without any weakness, paresthesias. Denies having similar Sx in the past.  Preventive Screening-Counseling & Management  Alcohol-Tobacco     Alcohol drinks/day: <1     Alcohol type: beer     Smoking Status: current     Smoking Cessation Counseling: yes     Packs/Day: 1pk/wk     Year Started: 1975  Caffeine-Diet-Exercise     Does Patient Exercise: no  Current Problems (verified): 1)  Osteoarthritis  (ICD-715.90) 2)  Carbuncle and Furuncle  of Other Specified Sites  (ICD-680.8) 3)  Preventive Health Care  (ICD-V70.0) 4)  Inadequate Material Resources  (ICD-V60.2) 5)  Tobacco Abuse  (ICD-305.1) 6)  Chest Pain, Non-cardiac  (ICD-786.59) 7)  Dental Disorder  (ICD-525.9) 8)  Colonic Polyps  (ICD-211.3) 9)  Gastritis  (ICD-535.50) 10)  Rectal Bleeding  (ICD-569.3) 11)  COPD  (ICD-496) 12)  Allergic Rhinitis  (ICD-477.9) 13)  Degenerative Joint Disease, Knees, Bilateral  (ICD-715.96) 14)  Hypertension  (ICD-401.9) 15)  Gerd  (ICD-530.81)  Current Medications (verified): 1)  Hydrochlorothiazide 25 Mg Tabs (Hydrochlorothiazide) .... Take 1 Tablet By Mouth Once A Day 2)  Omeprazole 20 Mg  Tbec (Omeprazole) .... Take 1 Tablet By Mouth Once A Day 3)  Proair Hfa 108 (90 Base) Mcg/act  Aers (Albuterol Sulfate) .... Take 1-2 Puffs Every 4-6 Hours As Needed For Shortness of Breath 4)  Claritin-D 24 Hour 10-240 Mg  Tb24 (Loratadine-Pseudoephedrine) .... Take 1 Tablet By Mouth Once A Day 5)  Lisinopril 20 Mg Tabs (Lisinopril) .... Take 1 Tablet By Mouth Once A Day 6)  Naproxen 250 Mg Tab (Naproxen) .... Take One (1) Tablet By Mouth Three (3) Times A Day With Food As Needed For Your Knee Pain  Allergies (verified): No Known Drug Allergies  Past History:  Past Medical History: Last updated: 03/15/2007 GERD Hypertension Chronic cough DJD- of knees bilaterally Allergic rhinitis Rectal bleeding - colonoscopy in 12/07 COPD Dysphagia -Normal EGD except for mild gastritis in 12/07 rectal polyp 12/07 with path showing it to be benign, though with a question that it was an incomplete biopsy.  Next scheduled colonoscopy 2010.  Social History: Last updated: 05/10/2007 Current Smoker - trying to quit Alcohol use-yes Drug use-no Occupation: unemployed, currently awaiting disability (3/09)  Risk Factors: Alcohol Use: <1 (02/24/2010) Exercise: no (02/24/2010)  Risk Factors: Smoking Status: current (02/24/2010) Packs/Day: 1pk/wk  (02/24/2010)  Physical Exam  General:  alert, well-developed, well-nourished, well-hydrated, and overweight-appearing.   Head:  normocephalic.   Eyes:  vision grossly intact, pupils equal, pupils round, and pupils reactive to light.   Mouth:  pharynx pink and moist.   Neck:  supple.   Lungs:  normal respiratory effort, normal breath sounds, no crackles, and no wheezes.   Heart:  normal rate, no murmur, and no JVD.   Abdomen:  soft, non-tender, normal bowel sounds, and no distention.   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 Point TTP at left SI joint with induces radiculopathy long lateral aspect of left LE down to his ankle. Pulses:  2+ Extremities:  No edema.  Neurologic:  alert & oriented X3, cranial nerves II-XII intact, strength normal in all extremities,gait is abnormal due to congenital deformity of both LE's. Skin:  no rashes and no suspicious lesions.   Psych:  Cognition and judgment appear intact. Alert and cooperative with normal attention span and concentration. 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 Discussed with dr. Aundria Rud. will refer to Orthopedic specilaist --mostl likely would need nerve conduction study/EMG and MRI of L-S spines. Tramadol for pain for now.will follow up on ortho recs. Orders: Orthopedic Referral (Ortho)  Problem # 2:  HYPERTENSION (ICD-401.9) Assessment: Comment Only controlled. No change in therapy. His updated medication list for this problem includes:    Hydrochlorothiazide 25 Mg Tabs (Hydrochlorothiazide) .Marland Kitchen... Take 1 tablet by mouth once a day    Lisinopril 20 Mg Tabs (Lisinopril) .Marland Kitchen... Take 1 tablet by mouth once a day  Orders: T-Basic Metabolic  Panel ([id number])  BP today: 132/84 Prior BP: 129/88 (12/24/2009)  Labs Reviewed: K+: 3.6 (11/03/2008) Creat: : 1.07 (11/03/2008)   Chol: 200 (11/03/2008)   HDL: 68 (11/03/2008)   LDL: 116 (11/03/2008)   TG: 81 (11/03/2008)  Complete Medication List: 1)  Hydrochlorothiazide 25 Mg Tabs (Hydrochlorothiazide) .... Take 1 tablet by mouth once a day 2)  Omeprazole 20 Mg Tbec (Omeprazole) .... Take 1 tablet by mouth once a day 3)  Proair Hfa 108 (90 Base) Mcg/act Aers (Albuterol sulfate) .... Take 1-2 puffs every 4-6 hours as needed for shortness of breath 4)  Claritin-d 24 Hour 10-240 Mg Tb24 (Loratadine-pseudoephedrine) .... Take 1 tablet by mouth once a day 5)  Lisinopril 20 Mg Tabs (Lisinopril) .... Take 1 tablet by mouth once a day 6)  Naproxen 250 Mg Tab (Naproxen) .... Take one (1) tablet by mouth three (3) times a day with food as needed for your knee pain 7)  Tramadol Hcl 50 Mg Tabs (Tramadol hcl) .... Take one to  two tablets by mouth q 6 hours as needed for pain  Other Orders: T-Lipid Profile ([id number])  Patient Instructions: 1)  Please, follow up with a referral to a specialist. 2)  Please, pick up your new prescription at the pharmacy. 3)  Please, call if medication is not efficient or you have any other questions. 4)  Please, folllow up with [place] after you see a specialist. Prescriptions: TRAMADOL HCL 50 MG TABS (TRAMADOL HCL) Take one to two tablets by mouth q 6 hours as needed for pain  #120 x 3   Entered and Authorized by:   Deatra Robinson MD   Signed by:   Deatra Robinson MD on 02/24/2010   Method used:   [facility]        [facility] [phone]* (retail)       [address]       [place], [place]  [address]       Ph: [phone] or [phone]       Fax: [phone]   RxID:   [id number]   Handout requested.    Orders Added: 1)  T-Lipid Profile [[id number]] 2)  T-Basic Metabolic Panel [[id number]] 3)  Est.  Patient Level III [60109] 4)  Orthopedic Referral [Ortho]   Process Orders Check Orders Results:     [facility]: Check successful Tests Sent for requisitioning (February 24, 2010 9:58 AM):     02/24/2010: [facility] -- T-Lipid Profile [id number] (signed)     02/24/2010: [facility] -- T-Basic Metabolic Panel [id number] (signed)     Prevention & Chronic Care Immunizations   Influenza vaccine: Fluvax MCR  (12/24/2009)   Influenza vaccine deferral: Not indicated  (06/02/2009)   Influenza vaccine due: 10/15/2011    Tetanus booster: Not documented   Td booster deferral: Not indicated  (06/02/2009)   Tetanus booster due: 02/25/2020    Pneumococcal vaccine: Not documented  Colorectal Screening   Hemoccult: Not documented   Hemoccult action/deferral: Refused  (12/24/2009)   Hemoccult due: 02/25/2011    Colonoscopy: Adenomatous Polyp  (02/01/2006)   Colonoscopy action/deferral: Not indicated  (02/24/2010)   Colonoscopy due: 02/02/2011  Other Screening   PSA: Not documented   PSA action/deferral: Discussed-PSA declined  (06/02/2009)   Smoking status: current  (02/24/2010)   Smoking cessation counseling: yes  (02/24/2010)  Lipids   Total Cholesterol: 200  (11/03/2008)   Lipid panel action/deferral: Lipid Panel ordered   LDL: 116  (11/03/2008)   LDL Direct: Not documented   HDL: 68  (11/03/2008)   Triglycerides: 81  (11/03/2008)   Lipid panel due: 02/25/2011  Hypertension   Last Blood Pressure: 132 / 84  (02/24/2010)   Serum creatinine: 1.07  (11/03/2008)   BMP action: Ordered   Serum potassium 3.6  (11/03/2008)   Basic metabolic panel due: 02/25/2011    Hypertension flowsheet reviewed?: Yes   Progress toward BP goal: Unchanged    Stage of readiness to change (hypertension management): Maintenance  Self-Management Support :   Personal Goals (by the next clinic visit) :      Personal blood pressure goal:  140/90  (11/03/2008)   Patient will work on the following items until the next clinic visit to reach self-care goals:     Medications and monitoring: take my medicines every day  (02/24/2010)     Eating: eat more vegetables, eat foods that are low in salt, eat baked foods instead of fried foods  (  02/24/2010)     Activity: take a 30 minute walk every day  (02/24/2010)    Hypertension self-management support: Resources for patients handout  (02/24/2010)    Self-management comments: WALK SHORT DISTANCES      Resource handout printed.   Nursing Instructions: Give tetanus booster today    Appended Document: PATIENT LEG HURTS/SB.   Immunizations Administered:  Tetanus Vaccine:    Vaccine Type: Tdap    Site: right deltoid    Mfr: [employer]oSmith[employer]    Dose: 0.5 ml    Route: IM    Given by: Angelina Ok RN    Exp. Date: 12/03/2011    Lot #: [id number]    VIS given: 01/01/08 version given February 24, 2010.

## 2010-03-17 NOTE — Progress Notes (Signed)
  Phone Note Outgoing Call   Call placed by: Theotis Barrio NT II,  March 01, 2010 1:27 PM Call placed to: Patient Details for Reason: [facility] Summary of Call: LEFT VOICE MESSAGE FOR PATIENT / [facility]- JANUARY 27, 012 @  3:00PM (2:45PM) / [address] / LOCATED ABOUT [facility].  APPT LETTER ALSO MAILED TO THE PATIENT.

## 2010-04-20 ENCOUNTER — Ambulatory Visit (INDEPENDENT_AMBULATORY_CARE_PROVIDER_SITE_OTHER): Payer: Medicare Other | Admitting: Internal Medicine

## 2010-04-20 ENCOUNTER — Encounter: Payer: Self-pay | Admitting: Internal Medicine

## 2010-04-20 DIAGNOSIS — I10 Essential (primary) hypertension: Secondary | ICD-10-CM

## 2010-04-20 DIAGNOSIS — K219 Gastro-esophageal reflux disease without esophagitis: Secondary | ICD-10-CM

## 2010-04-20 DIAGNOSIS — M1710 Unilateral primary osteoarthritis, unspecified knee: Secondary | ICD-10-CM

## 2010-04-20 MED ORDER — LISINOPRIL 20 MG PO TABS: 20.0000 mg | ORAL_TABLET | Freq: Every day | ORAL | Status: DC

## 2010-04-20 MED ORDER — HYDROCHLOROTHIAZIDE 25 MG PO TABS: 25.0000 mg | ORAL_TABLET | Freq: Every day | ORAL | Status: DC

## 2010-04-20 MED ORDER — OMEPRAZOLE 20 MG PO CPDR: 20.0000 mg | DELAYED_RELEASE_CAPSULE | Freq: Every day | ORAL | Status: DC

## 2010-04-20 NOTE — Patient Instructions (Signed)
Please try to cut your smoking further and set up a date to quit. Smoking is bad to your health.

## 2010-04-20 NOTE — Assessment & Plan Note (Signed)
He has acid reflux and has not taken PPI for 1 year. Will refill omeprazole for him.

## 2010-04-20 NOTE — Assessment & Plan Note (Signed)
Lab Results  Component Value Date   NA 138 02/24/2010   K 4.0 02/24/2010   CL 99 02/24/2010   CO2 27 02/24/2010   BUN 12 02/24/2010   CREATININE 1.12 02/24/2010    BP Readings from Last 3 Encounters:  04/20/10 124/86  02/24/10 132/84  12/24/09 129/88    Assessment:  BP well controlled and will continue current medications. Refill his meds for him.

## 2010-04-20 NOTE — Assessment & Plan Note (Signed)
Brace and tramadol , tylenol helped to control his knee pain a lot and he continued exercise and does not want to have knee replacement at this moment.

## 2010-04-20 NOTE — Progress Notes (Signed)
  Subjective:    Patient ID: Antonio [name], male    DOB: [birth year], 55 y.o.   MRN: [id number]  HPI Pt is a 55 y/o with PMH of HTN, OA, GERD comes in for regular f/u and meds refill. He still has acid reflux and has not taken omeprazole for a year. His knee pain has been well controlled with tramadol and tylenol and brace. Usually has albuterol inhaler twice  A month as needed.  No SOB, CP, abdominal pain, melena, dysuria.  Current smoker, has cut to 2 cigs per day,  no recent drink or drug use.     Review of Systems  Constitutional: Negative for fever and chills.  HENT: Negative for congestion and sneezing.   Respiratory: Negative for cough, chest tightness, wheezing and stridor.   Cardiovascular: Negative for chest pain and palpitations.  Gastrointestinal: Negative for vomiting, abdominal pain, abdominal distention and anal bleeding.  Genitourinary: Negative for dysuria.  Musculoskeletal: Positive for arthralgias.  Neurological: Negative for syncope and headaches.       Objective:   Physical Exam  Constitutional: He is oriented to person, place, and time. He appears well-developed and well-nourished.  HENT:  Head: Normocephalic and atraumatic.  Eyes: Conjunctivae and EOM are normal. Pupils are equal, round, and reactive to light.  Neck: Normal range of motion. Neck supple. No thyromegaly present.  Cardiovascular: Normal rate, regular rhythm, normal heart sounds and intact distal pulses.   Pulmonary/Chest: Effort normal and breath sounds normal. He has no wheezes. He exhibits no tenderness.  Abdominal: Soft. Bowel sounds are normal. He exhibits no distension. There is no rebound and no guarding.  Musculoskeletal: Normal range of motion. He exhibits tenderness.       Right knee: tenderness found.       Left knee: tenderness found.  Lymphadenopathy:    He has no cervical adenopathy.  Neurological: He is alert and oriented to person, place, and time. He has normal reflexes.            Assessment & Plan:

## 2010-04-21 ENCOUNTER — Other Ambulatory Visit: Payer: Self-pay | Admitting: Internal Medicine

## 2010-04-21 DIAGNOSIS — I10 Essential (primary) hypertension: Secondary | ICD-10-CM

## 2010-05-20 ENCOUNTER — Other Ambulatory Visit: Payer: Self-pay | Admitting: Internal Medicine

## 2010-07-15 ENCOUNTER — Encounter: Payer: Self-pay | Admitting: Internal Medicine

## 2010-10-20 ENCOUNTER — Other Ambulatory Visit: Payer: Self-pay | Admitting: Internal Medicine

## 2010-11-01 ENCOUNTER — Encounter: Payer: Self-pay | Admitting: Internal Medicine

## 2010-11-01 ENCOUNTER — Ambulatory Visit (INDEPENDENT_AMBULATORY_CARE_PROVIDER_SITE_OTHER): Payer: Medicare Other | Admitting: Internal Medicine

## 2010-11-01 VITALS — BP 135/85 | HR 61 | Temp 98.8°F | Ht 64.0 in | Wt 184.3 lb

## 2010-11-01 DIAGNOSIS — Z23 Encounter for immunization: Secondary | ICD-10-CM

## 2010-11-01 DIAGNOSIS — F17200 Nicotine dependence, unspecified, uncomplicated: Secondary | ICD-10-CM

## 2010-11-01 DIAGNOSIS — Z0000 Encounter for general adult medical examination without abnormal findings: Secondary | ICD-10-CM

## 2010-11-01 DIAGNOSIS — J449 Chronic obstructive pulmonary disease, unspecified: Secondary | ICD-10-CM

## 2010-11-01 DIAGNOSIS — I10 Essential (primary) hypertension: Secondary | ICD-10-CM

## 2010-11-01 MED ORDER — LISINOPRIL 20 MG PO TABS: 20.0000 mg | ORAL_TABLET | Freq: Every day | ORAL | Status: DC

## 2010-11-01 NOTE — Assessment & Plan Note (Signed)
Patient is smoking 1 pack per week, which he believes is pretty good.  He does note that he will try to quit smoking, and does not want to be referred for smoking cessation counseling at this time.  We discussed the risks of smoking and how it effects his heart.    Plan: follow up in 6-7 months.

## 2010-11-01 NOTE — Assessment & Plan Note (Signed)
BP at goal.  Patient does not have any barriers to acquiring medications.  Medications refilled for 90 day supply.  No active issue.  Plan: Continue HCTZ 25mg  daily and Lisinopril 20mg  daily.

## 2010-11-01 NOTE — Assessment & Plan Note (Signed)
Uses albuterol inhaler rarely.  No active issues.  No complaints.  Patient currently still smoker.  Plan: Albuterol as needed Pneumovax today. (no history noted in echart) Will refer to social work because he recently lost medicare D, and will need it reinstated to afford albuterol and tramadol when he needs refills of these medications.

## 2010-11-01 NOTE — Patient Instructions (Signed)
-  Try your best to quit smoking!  It's the best thing you can do for your health!  -Your cholesterol was elevated in January 2012.  At this point, let's try some lifestyle changes...try to exercise for about 30 minutes 3 times per week and change your diet!  Less desserts & salty and fried foods!    -Come see [place] again around April-May to recheck your cholesterol.  -If you have any problems before April-May 2013, feel free to call the clinic at [phone].  -You're doing great with your blood pressure! Keep it up!

## 2010-11-01 NOTE — Progress Notes (Signed)
Subjective:   Patient ID: Antonio [name] male   DOB: 1955-10-29 55 y.o.   MRN: [id number]  HPI: [name] is a 55 y.o. man with no complaints currently.  He comes to the clinic today for antihypertensive medication refills and to receive a flu vaccine.  He does note that he takes tramadol for his leg/knee pain only when he needs it (2/2 bowed legs- varus deformity), but does not require a refill for that today.  No significant issues regarding COPD - rarely uses albuterol inhaler.  Used inhaler today for the first time in weeks.  Currently denies SOB/CP/cough/sputum production.    Past Medical History  Diagnosis Date  . GERD (gastroesophageal reflux disease)   . COPD (chronic obstructive pulmonary disease)   . Hypertension   . Tobacco abuse   . Arthritis   . Colonic polyp   . Chronic cough   . 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 Allergy     allergic rhinitis  . Rectal bleeding     colonoscopy in 01/2006  . Dysphagia     normal EGD except for mild gastritis in 01/2006  . Rectal polyp     01/2006 with path showing it to be benign, though with a question that it was incomplete biopsy, next scheduled coloscopy 2010   Current Outpatient Prescriptions  Medication Sig Dispense Refill  . albuterol (PROAIR HFA) 108 (90 BASE) MCG/ACT inhaler Inhale 1-2 puffs into the lungs every 4 (four) hours as needed. Or every 6 hours as needed for shortness of breath      . hydrochlorothiazide 25 MG tablet TAKE 1 TABLET BY MOUTH ONCE A DAY  90 tablet  2  . lisinopril (PRINIVIL,ZESTRIL) 20 MG tablet Take 1 tablet (20 mg total) by mouth daily.  90 tablet  3  . naproxen (NAPROSYN) 250 MG tablet Take 250 mg by mouth 3 (three) times daily with meals. As needed for your knee pain       . omeprazole (PRILOSEC) 20 MG capsule Take 1 capsule (20 mg total) by mouth daily.  30 capsule  5  . traMADol (ULTRAM) 50 MG tablet Take 50-100 mg by mouth every 6 (six) hours as needed.  For pain       . loratadine-pseudoephedrine (CLARITIN-D 24-HOUR) 10-240 MG per 24 hr tablet Take 1 tablet by mouth daily.         Family History  Problem Relation Age of Onset  . Cancer Mother   . Hypertension Father   . Diabetes Father    History   Social History  . Marital Status: Single    Spouse Name: N/A    Number of Children: N/A  . Years of Education: N/A   Social History Main Topics  . Smoking status: Current Everyday Smoker -- 0.2 packs/day for 3 years    Types: Cigarettes  . Smokeless tobacco: None  . Alcohol Use: 2.4 oz/week    4 Glasses of wine per week  . Drug Use: No  . Sexually Active: None   Social History Narrative   Currently awaiting disability.Cell# [phone]   Review of Systems: Constitutional: Denies fever, chills, diaphoresis, appetite change and fatigue.  HEENT: Denies photophobia, eye pain, redness, hearing loss, ear pain, congestion, sore throat, rhinorrhea, sneezing, mouth sores, trouble swallowing, neck pain, neck stiffness and tinnitus.   Respiratory: Denies SOB, cough, chest tightness,  and wheezing.   Cardiovascular: Denies chest pain, palpitations and leg swelling.  Gastrointestinal: Denies  nausea, vomiting, abdominal pain, diarrhea, constipation, blood in stool and abdominal distention.  Genitourinary: Denies dysuria, urgency, frequency, hematuria, flank pain and difficulty urinating.  Musculoskeletal: Denies myalgias, back pain, joint swelling, no complaints regarding varus deformity or problem with gait (uses brace for support)  Skin: Denies pallor, rash and wound.  Neurological: Denies dizziness, seizures, syncope, weakness, light-headedness, numbness and headaches.   Objective:  Physical Exam: Filed Vitals:   11/01/10 1449  BP: 135/85  Pulse: 61  Temp: 98.8 F (37.1 C)  TempSrc: Oral  Height: 5\' 4"  (1.626 m)  Weight: 184 lb 4.8 oz (83.598 kg)   Constitutional: Vital signs reviewed.  Patient is a well-developed and well-nourished  man in no acute distress and cooperative with exam. Alert and oriented x3.  Mouth: no erythema or exudates, MMM Eyes: PERRL, EOMI, conjunctivae normal, No scleral icterus.  Neck: Supple, Trachea midline normal ROM Cardiovascular: RRR, S1 normal, S2 normal, no MRG, pulses symmetric and intact bilaterally Pulmonary/Chest: CTAB, no wheezes, rales, or rhonchi Abdominal: Soft. Non-tender, non-distended, bowel sounds are normal, no masses, organomegaly, or guarding present.  GU: no CVA tenderness Musculoskeletal: bilateral lower extremity varus deformity of the knee; No erythema or stiffness Neurological: A&O x3, Strenght is normal and symmetric bilaterally, cranial nerve II-XII are grossly intact, no focal motor deficit, sensory intact to light touch bilaterally.  Skin: Warm, dry and intact. No rash, cyanosis, or clubbing.  Psychiatric: Normal mood and affect. speech and behavior is normal. Judgment and thought content normal. Cognition and memory are normal.   Assessment & Plan:  Case and care discussed with Dr. Rogelia Boga.  Please see problem oriented charting for further details. Patient to return in 6-7 months to recheck cholesterol and follow progress of smoking cessation.

## 2010-11-21 ENCOUNTER — Other Ambulatory Visit: Payer: Self-pay | Admitting: Internal Medicine

## 2010-11-24 ENCOUNTER — Telehealth: Payer: Self-pay | Admitting: Licensed Clinical Social Worker

## 2010-11-25 ENCOUNTER — Telehealth: Payer: Self-pay | Admitting: Licensed Clinical Social Worker

## 2010-11-25 NOTE — Telephone Encounter (Signed)
Spoke w/ Dorene Sorrow this morning and gave him information to access [employer]/ [employer] so he could enroll in Medicare D program.  His income is 1,400 from Disability so he misses the cutoff for full subsidy.  In the meantime I also told him he could go to [employer] for med assist until he gets his Medicare D in place.  Sending him information in the mail for both programs.

## 2010-12-15 NOTE — Telephone Encounter (Signed)
See phone note of 10/12.

## 2010-12-23 ENCOUNTER — Other Ambulatory Visit: Payer: Self-pay | Admitting: *Deleted

## 2010-12-23 MED ORDER — TRAMADOL HCL 50 MG PO TABS: 50.0000 mg | ORAL_TABLET | Freq: Four times a day (QID) | ORAL | Status: DC | PRN

## 2011-03-28 ENCOUNTER — Other Ambulatory Visit: Payer: Self-pay | Admitting: *Deleted

## 2011-03-28 MED ORDER — TRAMADOL HCL 50 MG PO TABS: 50.0000 mg | ORAL_TABLET | Freq: Four times a day (QID) | ORAL | Status: DC | PRN

## 2011-05-24 ENCOUNTER — Other Ambulatory Visit: Payer: Self-pay | Admitting: *Deleted

## 2011-05-24 MED ORDER — HYDROCHLOROTHIAZIDE 25 MG PO TABS: 25.0000 mg | ORAL_TABLET | Freq: Every day | ORAL | Status: DC

## 2011-06-16 ENCOUNTER — Ambulatory Visit (INDEPENDENT_AMBULATORY_CARE_PROVIDER_SITE_OTHER): Payer: Medicare Other | Admitting: Internal Medicine

## 2011-06-16 ENCOUNTER — Encounter: Payer: Self-pay | Admitting: Internal Medicine

## 2011-06-16 VITALS — BP 134/74 | HR 102 | Temp 98.4°F | Ht 64.0 in | Wt 178.0 lb

## 2011-06-16 DIAGNOSIS — K219 Gastro-esophageal reflux disease without esophagitis: Secondary | ICD-10-CM

## 2011-06-16 DIAGNOSIS — M1710 Unilateral primary osteoarthritis, unspecified knee: Secondary | ICD-10-CM

## 2011-06-16 DIAGNOSIS — J309 Allergic rhinitis, unspecified: Secondary | ICD-10-CM

## 2011-06-16 DIAGNOSIS — I10 Essential (primary) hypertension: Secondary | ICD-10-CM

## 2011-06-16 DIAGNOSIS — J449 Chronic obstructive pulmonary disease, unspecified: Secondary | ICD-10-CM

## 2011-06-16 DIAGNOSIS — J4489 Other specified chronic obstructive pulmonary disease: Secondary | ICD-10-CM

## 2011-06-16 DIAGNOSIS — IMO0002 Reserved for concepts with insufficient information to code with codable children: Secondary | ICD-10-CM

## 2011-06-16 DIAGNOSIS — M21169 Varus deformity, not elsewhere classified, unspecified knee: Secondary | ICD-10-CM | POA: Insufficient documentation

## 2011-06-16 LAB — COMPREHENSIVE METABOLIC PANEL
ALT: 18 U/L (ref 0–53)
AST: 19 U/L (ref 0–37)
Albumin: 4.6 g/dL (ref 3.5–5.2)
Alkaline Phosphatase: 63 U/L (ref 39–117)
BUN: 16 mg/dL (ref 6–23)
CO2: 27 mEq/L (ref 19–32)
Calcium: 9.8 mg/dL (ref 8.4–10.5)
Chloride: 99 mEq/L (ref 96–112)
Creat: 1.07 mg/dL (ref 0.50–1.35)
Glucose, Bld: 93 mg/dL (ref 70–99)
Potassium: 4.2 mEq/L (ref 3.5–5.3)
Sodium: 138 mEq/L (ref 135–145)
Total Bilirubin: 0.5 mg/dL (ref 0.3–1.2)
Total Protein: 7.2 g/dL (ref 6.0–8.3)

## 2011-06-16 LAB — LIPID PANEL
Cholesterol: 218 mg/dL — ABNORMAL HIGH (ref 0–200)
HDL: 58 mg/dL (ref >39)
LDL Cholesterol: 137 mg/dL — ABNORMAL HIGH (ref 0–99)
Total CHOL/HDL Ratio: 3.8 Ratio
Triglycerides: 116 mg/dL (ref <150)
VLDL: 23 mg/dL (ref 0–40)

## 2011-06-16 MED ORDER — LORATADINE 10 MG PO TABS: 10.0000 mg | ORAL_TABLET | Freq: Every day | ORAL | Status: DC

## 2011-06-16 MED ORDER — HYDROCHLOROTHIAZIDE 25 MG PO TABS: 25.0000 mg | ORAL_TABLET | Freq: Every day | ORAL | Status: DC

## 2011-06-16 MED ORDER — FLUTICASONE PROPIONATE 50 MCG/ACT NA SUSP: 2.0000 | Freq: Every day | NASAL | Status: DC

## 2011-06-16 MED ORDER — TRAMADOL HCL 50 MG PO TABS: 50.0000 mg | ORAL_TABLET | Freq: Four times a day (QID) | ORAL | Status: DC | PRN

## 2011-06-16 MED ORDER — LISINOPRIL 20 MG PO TABS: 20.0000 mg | ORAL_TABLET | Freq: Every day | ORAL | Status: DC

## 2011-06-16 NOTE — Assessment & Plan Note (Addendum)
Lab Results  Component Value Date   NA 138 02/24/2010   K 4.0 02/24/2010   CL 99 02/24/2010   CO2 27 02/24/2010   BUN 12 02/24/2010   CREATININE 1.12 02/24/2010    BP Readings from Last 3 Encounters:  06/16/11 134/74  11/01/10 135/85  04/20/10 124/86    Assessment: Hypertension control:  controlled  Progress toward goals:  at goal Barriers to meeting goals:  no barriers identified  Plan: Hypertension treatment:  continue current medications, cmet today to monitor renal function Check cholesterol

## 2011-06-16 NOTE — Assessment & Plan Note (Signed)
Patient has congenital valgus deformity. Tramadol manages his pain well. He also uses bilateral braces.  Refill tramadol

## 2011-06-16 NOTE — Patient Instructions (Signed)
-  Please continue hydrochlorothiazide and lisinopril as instructed.  -Please stop taking Claritin D. Start using Flonase nasal spray daily.  If your allergies persist you may also add regular Claritin (loratadine) daily.  -Try to stop using the medication for acid reflux. If your heartburn persists he may restart it.  -Year tramadol has been refilled.

## 2011-06-16 NOTE — Assessment & Plan Note (Signed)
Trial off of Prilosec.  Patient agreeable.

## 2011-06-16 NOTE — Progress Notes (Signed)
Subjective:   Patient ID: Antonio [name] male   DOB: [birth year] 56 y.o.   MRN: [id number]  HPI: [name] is a 56 y.o. male with past medical history significant for congenital varus deformity, hypertension, tobacco abuse and GERD. He presents today for routine followup.  He currently has no new complaints. He does complain of chronic bilateral lower extremity pain which is well controlled with tramadol.  He describes pain as intermittent and sharp, with occasional shooting sensation.  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  He has been seen by orthopedics in the past.   Again as at his last visit, he has no significant issues regarding his COPD. He has not used his albuterol inhaler in about a year.  He does note recent rhinorrhea d/t seasonal allergies. He has tried using Claritin-D with minimal relief.  His father recently passed away but he feels that he is coping with this well, and does not feel any symptoms of depression.   Current Outpatient Prescriptions  Medication Sig Dispense Refill  . hydrochlorothiazide (HYDRODIURIL) 25 MG tablet Take 1 tablet (25 mg total) by mouth daily.  90 tablet  2  . lisinopril (PRINIVIL,ZESTRIL) 20 MG tablet Take 1 tablet (20 mg total) by mouth daily.  90 tablet  3  . traMADol (ULTRAM) 50 MG tablet Take 1-2 tablets (50-100 mg total) by mouth every 6 (six) hours as needed. For pain  30 tablet  6  . DISCONTD: hydrochlorothiazide (HYDRODIURIL) 25 MG tablet Take 1 tablet (25 mg total) by mouth daily.  30 tablet  2  . DISCONTD: lisinopril (PRINIVIL,ZESTRIL) 20 MG tablet Take 1 tablet (20 mg total) by mouth daily.  90 tablet  3  . albuterol (PROAIR HFA) 108 (90 BASE) MCG/ACT inhaler Inhale 1-2 puffs into the lungs every 4 (four) hours as needed. Or every 6 hours as needed for shortness of breath      . fluticasone (FLONASE) 50 MCG/ACT nasal spray Place 2 sprays into the nose daily.  16 g  3  . loratadine (CLARITIN) 10 MG tablet Take 1 tablet  (10 mg total) by mouth daily.  30 tablet  2    Review of Systems: Constitutional: Denies fever, chills, diaphoresis, appetite change and fatigue.  HEENT: Denies photophobia, eye pain, redness, hearing loss, ear pain, sore throat, sneezing, mouth sores, trouble swallowing, neck pain, neck stiffness and tinnitus.   Respiratory: Denies SOB, DOE, cough, chest tightness,  and wheezing.   Cardiovascular: Denies chest pain, palpitations and leg swelling.  Gastrointestinal: Denies nausea, vomiting, abdominal pain, diarrhea, constipation, blood in stool and abdominal distention.  Genitourinary: Denies dysuria, urgency, frequency, hematuria, flank pain and difficulty urinating.  Musculoskeletal: Denies myalgias, back pain, joint swelling; no complaints regarding varus deformity, no difficulty with gait (uses brace and walking cane) Skin: Denies pallor, rash and wound.  Neurological: Denies dizziness, seizures, syncope, weakness, light-headedness, numbness and headaches.  Psychiatric/Behavioral: Denies suicidal ideation, mood changes, confusion, nervousness, sleep disturbance and agitation  Objective:  Physical Exam: Filed Vitals:   06/16/11 1506  BP: 134/74  Pulse: 102  Temp: 98.4 F (36.9 C)  TempSrc: Oral  Height: 5\' 4"  (1.626 m)  Weight: 178 lb (80.74 kg)   Constitutional: Vital signs reviewed.  Patient is a well-developed and well-nourished man in no acute distress and cooperative with exam.  Mouth: no erythema or exudates, MMM, good dentition Eyes: PERRL, EOMI, conjunctivae normal, No scleral icterus.  Neck: No thyromegaly Cardiovascular:mild tachycardia, S1 normal, S2 normal, no MRG, pulses  symmetric and intact bilaterally Pulmonary/Chest: CTAB, no wheezes, rales, or rhonchi Abdominal: Soft. Non-tender, non-distended, bowel sounds are normal, no masses, organomegaly, or guarding present.  Musculoskeletal: b/l lower extremity varus deformity of the knee no erythema or  stiffness Hematology: no cervical, inginal, or axillary adenopathy.  Neurological: A&O x3, Strength is normal and symmetric bilaterally, cranial nerve II-XII are grossly intact, no focal motor deficit, sensory intact to light touch bilaterally.  Psychiatric: Normal mood and affect. speech and behavior is normal. Judgment and thought content normal. Cognition and memory are normal.   Assessment & Plan:   Case and care discussed with Dr. Rogelia Boga. Patient to return in 6 months for hypertension followup. Please see problem or an attorney for further detail.

## 2011-06-16 NOTE — Assessment & Plan Note (Signed)
Asked that patient discontinue Claritin-D in setting of hypertension.  Plan- Start Flonase daily, may add loratadine if allergies persist

## 2012-01-17 ENCOUNTER — Encounter: Payer: Self-pay | Admitting: Internal Medicine

## 2012-01-17 ENCOUNTER — Ambulatory Visit (INDEPENDENT_AMBULATORY_CARE_PROVIDER_SITE_OTHER): Payer: Medicare Other | Admitting: Internal Medicine

## 2012-01-17 VITALS — BP 129/81 | HR 89 | Temp 98.6°F | Ht 60.0 in | Wt 171.3 lb

## 2012-01-17 DIAGNOSIS — J309 Allergic rhinitis, unspecified: Secondary | ICD-10-CM

## 2012-01-17 DIAGNOSIS — Z131 Encounter for screening for diabetes mellitus: Secondary | ICD-10-CM

## 2012-01-17 DIAGNOSIS — F17200 Nicotine dependence, unspecified, uncomplicated: Secondary | ICD-10-CM

## 2012-01-17 DIAGNOSIS — I10 Essential (primary) hypertension: Secondary | ICD-10-CM

## 2012-01-17 LAB — CBC
HCT: 46.7 % (ref 39.0–52.0)
MCH: 30.7 pg (ref 26.0–34.0)
MCV: 87.9 fL (ref 78.0–100.0)
Platelets: 313 10*3/uL (ref 150–400)
RBC: 5.31 MIL/uL (ref 4.22–5.81)
RDW: 14.7 % (ref 11.5–15.5)
WBC: 6.9 10*3/uL (ref 4.0–10.5)

## 2012-01-17 LAB — COMPREHENSIVE METABOLIC PANEL
ALT: 18 U/L (ref 0–53)
BUN: 7 mg/dL (ref 6–23)
CO2: 30 mEq/L (ref 19–32)
Calcium: 9.5 mg/dL (ref 8.4–10.5)
Chloride: 97 mEq/L (ref 96–112)
Creat: 0.99 mg/dL (ref 0.50–1.35)
Total Bilirubin: 0.7 mg/dL (ref 0.3–1.2)

## 2012-01-17 NOTE — Patient Instructions (Signed)
Smoking Cessation Quitting smoking is important to your health and has many advantages. However, it is not always easy to quit since nicotine is a very addictive drug. Often times, people try 3 times or more before being able to quit. This document explains the best ways for you to prepare to quit smoking. Quitting takes hard work and a lot of effort, but you can do it. ADVANTAGES OF QUITTING SMOKING  You will live longer, feel better, and live better.  Your body will feel the impact of quitting smoking almost immediately.  Within 20 minutes, blood pressure decreases. Your pulse returns to its normal level.  After 8 hours, carbon monoxide levels in the blood return to normal. Your oxygen level increases.  After 24 hours, the chance of having a heart attack starts to decrease. Your breath, hair, and body stop smelling like smoke.  After 48 hours, damaged nerve endings begin to recover. Your sense of taste and smell improve.  After 72 hours, the body is virtually free of nicotine. Your bronchial tubes relax and breathing becomes easier.  After 2 to 12 weeks, lungs can hold more air. Exercise becomes easier and circulation improves.  The risk of having a heart attack, stroke, cancer, or lung disease is greatly reduced.  After 1 year, the risk of coronary heart disease is cut in half.  After 5 years, the risk of stroke falls to the same as a nonsmoker.  After 10 years, the risk of lung cancer is cut in half and the risk of other cancers decreases significantly.  After 15 years, the risk of coronary heart disease drops, usually to the level of a nonsmoker.  If you are pregnant, quitting smoking will improve your chances of having a healthy baby.  The people you live with, especially any children, will be healthier.  You will have extra money to spend on things other than cigarettes. QUESTIONS TO THINK ABOUT BEFORE ATTEMPTING TO QUIT You may want to talk about your answers with your  caregiver.  Why do you want to quit?  If you tried to quit in the past, what helped and what did not?  What will be the most difficult situations for you after you quit? How will you plan to handle them?  Who can help you through the tough times? Your family? Friends? A caregiver?  What pleasures do you get from smoking? What ways can you still get pleasure if you quit? Here are some questions to ask your caregiver:  How can you help me to be successful at quitting?  What medicine do you think would be best for me and how should I take it?  What should I do if I need more help?  What is smoking withdrawal like? How can I get information on withdrawal? GET READY  Set a quit date.  Change your environment by getting rid of all cigarettes, ashtrays, matches, and lighters in your home, car, or work. Do not let people smoke in your home.  Review your past attempts to quit. Think about what worked and what did not. GET SUPPORT AND ENCOURAGEMENT You have a better chance of being successful if you have help. You can get support in many ways.  Tell your family, friends, and co-workers that you are going to quit and need their support. Ask them not to smoke around you.  Get individual, group, or telephone counseling and support. Programs are available at local hospitals and health centers. Call your local health department for   information about programs in your area.  Spiritual beliefs and practices may help some smokers quit.  Download a "quit meter" on your computer to keep track of quit statistics, such as how long you have gone without smoking, cigarettes not smoked, and money saved.  Get a self-help book about quitting smoking and staying off of tobacco. LEARN NEW SKILLS AND BEHAVIORS  Distract yourself from urges to smoke. Talk to someone, go for a walk, or occupy your time with a task.  Change your normal routine. Take a different route to work. Drink tea instead of coffee.  Eat breakfast in a different place.  Reduce your stress. Take a hot bath, exercise, or read a book.  Plan something enjoyable to do every day. Reward yourself for not smoking.  Explore interactive web-based programs that specialize in helping you quit. GET MEDICINE AND USE IT CORRECTLY Medicines can help you stop smoking and decrease the urge to smoke. Combining medicine with the above behavioral methods and support can greatly increase your chances of successfully quitting smoking.  Nicotine replacement therapy helps deliver nicotine to your body without the negative effects and risks of smoking. Nicotine replacement therapy includes nicotine gum, lozenges, inhalers, nasal sprays, and skin patches. Some may be available over-the-counter and others require a prescription.  Antidepressant medicine helps people abstain from smoking, but how this works is unknown. This medicine is available by prescription.  Nicotinic receptor partial agonist medicine simulates the effect of nicotine in your brain. This medicine is available by prescription. Ask your caregiver for advice about which medicines to use and how to use them based on your health history. Your caregiver will tell you what side effects to look out for if you choose to be on a medicine or therapy. Carefully read the information on the package. Do not use any other product containing nicotine while using a nicotine replacement product.  RELAPSE OR DIFFICULT SITUATIONS Most relapses occur within the first 3 months after quitting. Do not be discouraged if you start smoking again. Remember, most people try several times before finally quitting. You may have symptoms of withdrawal because your body is used to nicotine. You may crave cigarettes, be irritable, feel very hungry, cough often, get headaches, or have difficulty concentrating. The withdrawal symptoms are only temporary. They are strongest when you first quit, but they will go away within  10 14 days. To reduce the chances of relapse, try to:  Avoid drinking alcohol. Drinking lowers your chances of successfully quitting.  Reduce the amount of caffeine you consume. Once you quit smoking, the amount of caffeine in your body increases and can give you symptoms, such as a rapid heartbeat, sweating, and anxiety.  Avoid smokers because they can make you want to smoke.  Do not let weight gain distract you. Many smokers will gain weight when they quit, usually less than 10 pounds. Eat a healthy diet and stay active. You can always lose the weight gained after you quit.  Find ways to improve your mood other than smoking. FOR MORE INFORMATION  www.smokefree.gov  Document Released: 01/24/2001 Document Revised: 08/01/2011 Document Reviewed: 05/11/2011 [employer] Patient Information 2013 [employer].  

## 2012-01-17 NOTE — Assessment & Plan Note (Addendum)
  Assessment:  Progress toward smoking cessation:   continues to smoke  Barriers to progress toward smoking cessation:   personal   Plan:  Instruction/counseling given:  I counseled patient on the dangers of tobacco use and advised patient to stop smoking.  Educational resources provided:  [employer]) brochure

## 2012-01-17 NOTE — Assessment & Plan Note (Addendum)
BP Readings from Last 3 Encounters:  01/17/12 129/81  06/16/11 134/74  11/01/10 135/85    Sodium  Date Value Range Status  01/17/2012 138  135 - 145 mEq/L Final     Potassium  Date Value Range Status  01/17/2012 4.3  3.5 - 5.3 mEq/L Final     Creat  Date Value Range Status  01/17/2012 0.99  0.50 - 1.35 mg/dL Final    Assessment:  Blood pressure control:  controlled  Progress toward BP goal:   at goal  Plan:  Medications:  continue current medications  Educational resources provided: brochure  Self management tools provided:    Framingham risk score is 16% (<20%), so will recheck lipid panel in 1 year and re-evaluate.

## 2012-01-17 NOTE — Progress Notes (Signed)
Subjective:   Patient ID: Antonio [name] male   DOB: 1955-08-23 56 y.o.   MRN: [id number]  HPI: Antonio [name] is a 56 y.o. male with past medical history significant for congenital varus deformity, hypertension, tobacco abuse and GERD. He presents today for routine followup. He again currently has no new complaints. He reports that his allergies have improved since taking flonase and loratadine.  He notes persisten chronic bilateral lower extremity pain which is well controlled with tramadol (1-2 tabs/day)  Since our last visit, he has tried to lose weight.  He stopped eating a lot a night, eating sausage/eggs/grits, all in moderation, not a lot of bread  No polyuria/dipsia/phagia.  Past Medical History  Diagnosis Date  . GERD (gastroesophageal reflux disease)   . COPD (chronic obstructive pulmonary disease)   . Hypertension   . Tobacco abuse   . Arthritis   . Colonic polyp   . Chronic cough   . 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 Allergy     allergic rhinitis  . Rectal bleeding     colonoscopy in 01/2006  . Dysphagia     normal EGD except for mild gastritis in 01/2006  . Rectal polyp     01/2006 with path showing it to be benign, though with a question that it was incomplete biopsy, next scheduled coloscopy 2010  . Varus deformity of knee     bilateral, congenital   Current Outpatient Prescriptions  Medication Sig Dispense Refill  . albuterol (PROAIR HFA) 108 (90 BASE) MCG/ACT inhaler Inhale 1-2 puffs into the lungs every 4 (four) hours as needed. Or every 6 hours as needed for shortness of breath      . fluticasone (FLONASE) 50 MCG/ACT nasal spray Place 2 sprays into the nose daily.  16 g  3  . hydrochlorothiazide (HYDRODIURIL) 25 MG tablet Take 1 tablet (25 mg total) by mouth daily.  90 tablet  2  . lisinopril (PRINIVIL,ZESTRIL) 20 MG tablet Take 1 tablet (20 mg total) by mouth daily.  90 tablet  3  . loratadine (CLARITIN) 10 MG tablet Take  1 tablet (10 mg total) by mouth daily.  30 tablet  2  . traMADol (ULTRAM) 50 MG tablet Take 1-2 tablets (50-100 mg total) by mouth every 6 (six) hours as needed. For pain  30 tablet  6   Family History  Problem Relation Age of Onset  . Cancer Mother   . Hypertension Father   . Diabetes Father    History   Social History  . Marital Status: Single    Spouse Name: N/A    Number of Children: N/A  . Years of Education: N/A   Social History Main Topics  . Smoking status: Current Every Day Smoker -- 0.2 packs/day for 3 years    Types: Cigarettes  . Smokeless tobacco: None  . Alcohol Use: 2.4 oz/week    4 Glasses of wine per week  . Drug Use: No  . Sexually Active: None   Other Topics Concern  . None   Social History Narrative   Currently awaiting disability.Cell# [phone]   Review of Systems: Constitutional: Denies fever, chills, diaphoresis, appetite change and fatigue.  HEENT: Denies photophobia, eye pain, redness, hearing loss, ear pain, congestion, sore throat, rhinorrhea, sneezing, mouth sores, trouble swallowing, neck pain, neck stiffness and tinnitus.   Respiratory: Denies SOB, DOE, cough, chest tightness,  and wheezing.   Cardiovascular: Denies chest pain, palpitations and leg swelling.  Gastrointestinal: Denies nausea, vomiting, abdominal pain, diarrhea, constipation, blood in stool and abdominal distention.  Genitourinary: Denies dysuria, urgency, frequency, hematuria, flank pain and difficulty urinating.  Musculoskeletal: Denies myalgias, back pain, joint swelling, arthralgias and gait problem.  Skin: Denies pallor, rash and wound.  Neurological: Denies dizziness, seizures, syncope, weakness, light-headedness, numbness and headaches.  Psychiatric/Behavioral: Denies suicidal ideation, mood changes, confusion, nervousness, sleep disturbance and agitation  Objective:  Physical Exam: Filed Vitals:   01/17/12 1314  BP: 129/81  Pulse: 89  Temp: 98.6 F (37 C)   TempSrc: Oral  Height: 5' (1.524 m)  Weight: 171 lb 4.8 oz (77.701 kg)  SpO2: 97%  PF: 4 L/min   Constitutional: Vital signs reviewed.  Patient is a well-developed and well-nourished man in no acute distress and cooperative with exam.  Head: Normocephalic and atraumatic Mouth: no erythema or exudates, MMM Eyes: PERRL, EOMI, conjunctivae normal, No scleral icterus.  Cardiovascular: RRR, S1 normal, S2 normal, no MRG, pulses symmetric and intact bilaterally Pulmonary/Chest: CTAB, no wheezes, rales, or rhonchi Abdominal: Soft. Non-tender, non-distended, bowel sounds are normal, no masses, organomegaly, or guarding present.  Musculoskeletal: b/l lower extremity varus deformity of the knee no erythema or stiffness Neurological: A&O x3 Skin: Warm, dry and intact. No rash, cyanosis, or clubbing.  Psychiatric: Normal mood and affect. speech and behavior is normal. Judgment and thought content normal. Cognition and memory are normal.   Assessment & Plan:  Patient to return in 12 months for routine follow up, sooner if needed. Case and care discussed with Dr. Meredith Pel. Please see problem oriented charting for further details.

## 2012-01-18 NOTE — Assessment & Plan Note (Signed)
Continue loratadine and flonase.

## 2012-02-16 ENCOUNTER — Other Ambulatory Visit: Payer: Self-pay | Admitting: Internal Medicine

## 2012-04-17 ENCOUNTER — Other Ambulatory Visit: Payer: Self-pay | Admitting: Internal Medicine

## 2012-05-21 ENCOUNTER — Other Ambulatory Visit: Payer: Self-pay | Admitting: Internal Medicine

## 2012-06-19 ENCOUNTER — Other Ambulatory Visit: Payer: Self-pay | Admitting: Internal Medicine

## 2012-06-25 ENCOUNTER — Other Ambulatory Visit: Payer: Self-pay | Admitting: *Deleted

## 2012-06-25 MED ORDER — HYDROCHLOROTHIAZIDE 25 MG PO TABS: ORAL_TABLET | ORAL | Status: DC

## 2012-06-25 NOTE — Telephone Encounter (Signed)
Pt called and informed of refill. 

## 2012-07-19 ENCOUNTER — Other Ambulatory Visit: Payer: Self-pay | Admitting: Internal Medicine

## 2012-08-09 ENCOUNTER — Telehealth: Payer: Self-pay | Admitting: Licensed Clinical Social Worker

## 2012-08-09 ENCOUNTER — Encounter: Payer: Self-pay | Admitting: Licensed Clinical Social Worker

## 2012-08-09 NOTE — Telephone Encounter (Signed)
CSW received request for [facility] service for Antonio [name].  CSW placed call to Antonio [name] to determine needs and eligibility.  Pt states he was aware of request, but does not have Medicaid and does not want to pay privately.  CSW informed Antonio [name] is the only payor source at this time that covers PCS.  Pt is aware he is ineligible under Medicare.  Pt denies add'l needs at this time.

## 2012-10-16 ENCOUNTER — Encounter: Payer: Self-pay | Admitting: Internal Medicine

## 2012-10-18 ENCOUNTER — Other Ambulatory Visit: Payer: Self-pay | Admitting: *Deleted

## 2012-10-18 MED ORDER — HYDROCHLOROTHIAZIDE 25 MG PO TABS: ORAL_TABLET | ORAL | Status: DC

## 2012-10-18 MED ORDER — TRAMADOL HCL 50 MG PO TABS: ORAL_TABLET | ORAL | Status: DC

## 2012-10-22 NOTE — Telephone Encounter (Signed)
Tramadol rx called to [facility]; pt informed.

## 2013-01-21 ENCOUNTER — Other Ambulatory Visit: Payer: Self-pay | Admitting: *Deleted

## 2013-01-21 MED ORDER — LISINOPRIL 20 MG PO TABS: ORAL_TABLET | ORAL | Status: DC

## 2013-02-04 ENCOUNTER — Encounter: Payer: Self-pay | Admitting: Internal Medicine

## 2013-02-04 ENCOUNTER — Encounter: Payer: Self-pay | Admitting: Licensed Clinical Social Worker

## 2013-02-04 ENCOUNTER — Ambulatory Visit (INDEPENDENT_AMBULATORY_CARE_PROVIDER_SITE_OTHER): Payer: Medicare Other | Admitting: Internal Medicine

## 2013-02-04 VITALS — BP 139/87 | HR 74 | Temp 97.4°F | Ht 63.0 in | Wt 179.8 lb

## 2013-02-04 DIAGNOSIS — Z23 Encounter for immunization: Secondary | ICD-10-CM

## 2013-02-04 DIAGNOSIS — F17200 Nicotine dependence, unspecified, uncomplicated: Secondary | ICD-10-CM

## 2013-02-04 DIAGNOSIS — D126 Benign neoplasm of colon, unspecified: Secondary | ICD-10-CM

## 2013-02-04 DIAGNOSIS — I10 Essential (primary) hypertension: Secondary | ICD-10-CM

## 2013-02-04 DIAGNOSIS — E785 Hyperlipidemia, unspecified: Secondary | ICD-10-CM

## 2013-02-04 DIAGNOSIS — Q682 Congenital deformity of knee: Secondary | ICD-10-CM

## 2013-02-04 DIAGNOSIS — M21169 Varus deformity, not elsewhere classified, unspecified knee: Secondary | ICD-10-CM

## 2013-02-04 LAB — LIPID PANEL
HDL: 61 mg/dL (ref >39)
LDL Cholesterol: 155 mg/dL — ABNORMAL HIGH (ref 0–99)
Total CHOL/HDL Ratio: 4 Ratio
VLDL: 27 mg/dL (ref 0–40)

## 2013-02-04 LAB — COMPREHENSIVE METABOLIC PANEL
ALT: 22 U/L (ref 0–53)
Alkaline Phosphatase: 69 U/L (ref 39–117)
Sodium: 137 mEq/L (ref 135–145)
Total Bilirubin: 0.6 mg/dL (ref 0.3–1.2)
Total Protein: 7.3 g/dL (ref 6.0–8.3)

## 2013-02-04 MED ORDER — HYDROCHLOROTHIAZIDE 25 MG PO TABS: ORAL_TABLET | ORAL | Status: DC

## 2013-02-04 MED ORDER — LORATADINE 10 MG PO TABS: ORAL_TABLET | ORAL | Status: DC

## 2013-02-04 MED ORDER — LISINOPRIL 20 MG PO TABS: ORAL_TABLET | ORAL | Status: DC

## 2013-02-04 NOTE — Assessment & Plan Note (Signed)
Pain well managed with 1-2 tramadol/day; compliant with knee braces
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
## 2013-02-04 NOTE — Assessment & Plan Note (Signed)
  Assessment: Progress toward smoking cessation:  smoking the same amount Barriers to progress toward smoking cessation:  lack of understanding of harms of tobacco;lack of motivation to quit  Plan: Instruction/counseling given:  I counseled patient on the dangers of tobacco use, advised patient to stop smoking, and reviewed strategies to maximize success. Educational resources provided:  [facility] (1-800-QUIT-NOW) brochure Medications to assist with smoking cessation:  None Patient agreed to the following self-care plans for smoking cessation: go to the [employer] (www.quitlinenc.com);call [facility] (1-800-QUIT-NOW)

## 2013-02-04 NOTE — Patient Instructions (Signed)
General Instructions:  -Keep up the great work with your blood pressure medications, all your refills should be 3 months at a time now  -Dr. Luan Moore nurse will call you to schedule you colonoscopy  -You will receive your flu shot today  -If you have reflux type symptoms (that funny cough feeling when you lay down), you may try tums  -We are going to check your cholesterol, kidney and liver function today  Please be sure to bring all of your medications with you to every visit.  Should you have any new or worsening symptoms, please be sure to call the clinic at [phone].   Treatment Goals:  Goals (1 Years of Data) as of 02/04/13   None      Progress Toward Treatment Goals:  Treatment Goal 02/04/2013  Blood pressure at goal  Stop smoking smoking the same amount    Self Care Goals & Plans:  Self Care Goal 02/04/2013  Manage my medications take my medicines as prescribed; bring my medications to every visit; refill my medications on time  Monitor my health -  Eat healthy foods eat foods that are low in salt  Stop smoking go to the [employer] ([employer]); call [facility] (1-800-QUIT-NOW)

## 2013-02-04 NOTE — Assessment & Plan Note (Signed)
BP Readings from Last 3 Encounters:  02/04/13 139/87  01/17/12 129/81  06/16/11 134/74    Lab Results  Component Value Date   NA 138 01/17/2012   K 4.3 01/17/2012   CREATININE 0.99 01/17/2012    Assessment: Blood pressure control: controlled Progress toward BP goal:  at goal  Plan: Medications:  continue current medications - hctz 25, lisinopril 20 (3 month refills filled today) Other plans: cmet today

## 2013-02-04 NOTE — Progress Notes (Signed)
Subjective:   Patient ID: Antonio [name] male   DOB: [birth year] 57 y.o.   MRN: [id number]  HPI: Antonio [name] is a 57 y.o. man with history of congenital varus deformity, hypertension, tobacco abuse and GERD. He presents today for routine followup. He again currently has no new complaints. He reports that his allergies have improved since taking loratadine, not taking flonase. He notes persisten chronic bilateral lower extremity pain which is well controlled with tramadol (1-2 tabs/day)  Due for flu shot- will give today.  Walks to the store once in a while, 30 min there and back.  Review of Systems: Constitutional: Denies fever, chills, diaphoresis, appetite change and fatigue.  HEENT: Denies photophobia, eye pain, redness, hearing loss, ear pain, congestion, sore throat, rhinorrhea, sneezing, mouth sores, trouble swallowing, neck pain, neck stiffness and tinnitus.  Respiratory: Denies SOB, DOE, chest tightness, and wheezing. occ cough with clear sputum Cardiovascular: Denies chest pain, palpitations and leg swelling.  Gastrointestinal: Denies nausea, vomiting, abdominal pain, diarrhea,blood in stool and abdominal distention. Occ constipation Genitourinary: Denies dysuria, urgency, frequency, hematuria, flank pain and difficulty urinating.  Skin: Denies pallor, rash and wound.  Neurological: Denies dizziness, seizures, syncope, weakness, lightheadedness, numbness and headaches.    Past Medical History  Diagnosis Date  . GERD (gastroesophageal reflux disease)   . COPD (chronic obstructive pulmonary disease)   . Hypertension   . Tobacco abuse   . Arthritis   . Colonic polyp   . Chronic cough   . 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 Allergy     allergic rhinitis  . Rectal bleeding     colonoscopy in 01/2006  . Dysphagia     normal EGD except for mild gastritis in 01/2006  . Rectal polyp     01/2006 with path showing it to be benign, though with  a question that it was incomplete biopsy, next scheduled coloscopy 2010  . Varus deformity of knee     bilateral, congenital   Current Outpatient Prescriptions  Medication Sig Dispense Refill  . albuterol (PROAIR HFA) 108 (90 BASE) MCG/ACT inhaler Inhale 1-2 puffs into the lungs every 4 (four) hours as needed. Or every 6 hours as needed for shortness of breath      . fluticasone (FLONASE) 50 MCG/ACT nasal spray Place 2 sprays into the nose daily.  16 g  3  . hydrochlorothiazide (HYDRODIURIL) 25 MG tablet TAKE ONE TABLET BY MOUTH EVERY DAY  30 tablet  3  . lisinopril (PRINIVIL,ZESTRIL) 20 MG tablet TAKE ONE TABLET BY MOUTH EVERY DAY  90 tablet  3  . loratadine (CLARITIN) 10 MG tablet TAKE ONE TABLET BY MOUTH EVERY DAY  30 tablet  5  . traMADol (ULTRAM) 50 MG tablet TAKE ONE TO TWO TABLETS BY MOUTH EVERY 6 HOURS AS NEEDED FOR PAIN  30 tablet  5   No current facility-administered medications for this visit.   Family History  Problem Relation Age of Onset  . Cancer Mother   . Hypertension Father   . Diabetes Father    History   Social History  . Marital Status: Single    Spouse Name: N/A    Number of Children: N/A  . Years of Education: N/A   Social History Main Topics  . Smoking status: Current Every Day Smoker -- 0.20 packs/day for 3 years    Types: Cigarettes  . Smokeless tobacco: None  . Alcohol Use: 2.4 oz/week    4  Glasses of wine per week  . Drug Use: No  . Sexual Activity: None   Other Topics Concern  . None   Social History Narrative   Currently awaiting disability.   Cell# [phone]    Objective:  Physical Exam: Filed Vitals:   02/04/13 1033  BP: 139/87  Pulse: 74  Temp: 97.4 F (36.3 C)  TempSrc: Oral  Height: 5\' 3"  (1.6 m)  Weight: 179 lb 12.8 oz (81.557 kg)  SpO2: 100%   Constitutional: Vital signs reviewed. Patient is a well-developed and well-nourished man in no acute distress and cooperative with exam.  Head: Normocephalic and atraumatic  Mouth:  no erythema or exudates, MMM, top & bottom dentures in place Eyes: PERRL, EOMI, conjunctivae normal, No scleral icterus.  Cardiovascular: RRR, S1 normal, S2 normal, no MRG, pulses symmetric and intact bilaterally  Pulmonary/Chest: CTAB, no wheezes, rales, or rhonchi  Abdominal: Soft. Non-tender, non-distended, bowel sounds are normal, no masses, organomegaly, or guarding present.  Musculoskeletal: b/l lower extremity varus deformity of the knee no erythema or stiffness, b/l knee braces in place Neurological: A&O x3  Skin: Warm, dry and intact. No rash, cyanosis, or clubbing.  Psychiatric: Normal mood and affect. speech and behavior is normal. Judgment and thought content normal. Cognition and memory are normal.    Assessment & Plan:  Case and care discussed with Dr. Kem Kays.  Please see problem oriented charting for further details. Patient to return in 1 year for routine f/u

## 2013-02-04 NOTE — Assessment & Plan Note (Signed)
Colonoscopy in 2007 with tubular adenoma. Repeat was suggested for 5 years. Dr. Luan Moore office contacted for referral - RN to call pt with appt

## 2013-02-10 NOTE — Progress Notes (Signed)
Case discussed with Dr. Sharda at time of visit.  We reviewed the resident's history and exam and pertinent patient test results.  I agree with the assessment, diagnosis, and plan of care documented in the resident's note. 

## 2013-02-17 ENCOUNTER — Telehealth: Payer: Self-pay | Admitting: *Deleted

## 2013-02-17 ENCOUNTER — Encounter: Payer: Self-pay | Admitting: *Deleted

## 2013-02-17 NOTE — Telephone Encounter (Signed)
SPOKE WITH OFFICE REGARDING A GI FOLLOW UP APPOINTMENT. OFFICE STATES THAT THEY HAVE BEEN TRYING TO CONTACT PATIENT SINCE DEC. 2014. ALSO TRIED TO CONTACT HIM AGAIN TODAY, AGAIN WITH SUCCESS.  AFTER SPEAKING WITH THE OFFICE , I TRIED TO CONTACT PATIENT. NUMBERS NO GOOD / CELL PHONE VOICE MAIL NOT SET UP .  LETTER MAILED TO THE PATIENT. /  NOTE: PATIENT CALLED WHILE WRITING THIS LETTER. PATIENT SAYS TO STILL MAIL THE LETTER.   Aleea Hendry NTII   2:09PM  [phone]

## 2013-02-20 DIAGNOSIS — E785 Hyperlipidemia, unspecified: Secondary | ICD-10-CM | POA: Insufficient documentation

## 2013-02-20 MED ORDER — PRAVASTATIN SODIUM 40 MG PO TABS: 40.0000 mg | ORAL_TABLET | Freq: Every evening | ORAL | Status: DC

## 2013-02-20 NOTE — Assessment & Plan Note (Signed)
Patient's total cholesterol is high, with a 10 year risk of 19%. With LDL of 155 and 2+ risk factors (tobacco, HTN, age), will start therapy with pravastatin 40mg  qHS.  Discussed with patient on 02/20/13 at 11am. LFTs wnl. Educated regarding monitoring for GI sx and myalgias.  Repeat lipid panel in 3 months.

## 2013-02-20 NOTE — Addendum Note (Signed)
Addended by: Othella Boyer on: 02/20/2013 11:17 AM   Modules accepted: Orders

## 2013-04-25 ENCOUNTER — Other Ambulatory Visit: Payer: Self-pay | Admitting: *Deleted

## 2013-04-25 MED ORDER — TRAMADOL HCL 50 MG PO TABS: ORAL_TABLET | ORAL | Status: DC

## 2013-04-25 NOTE — Telephone Encounter (Signed)
Rx called in 

## 2013-04-25 NOTE — Telephone Encounter (Signed)
Pt is out of meds

## 2013-05-28 ENCOUNTER — Telehealth: Payer: Self-pay | Admitting: *Deleted

## 2013-05-28 NOTE — Telephone Encounter (Signed)
Per chilon, pt called c/o no response to requests for refills of tramadol. Reviewed records, 3/13 tramadol #30 w/ 5 refills, checked w/ [facility], pharmacist states that the script was called in but had no refills, gave her the script #30 w/ 3 refills, and 30 must last 1 month

## 2013-06-02 ENCOUNTER — Other Ambulatory Visit (INDEPENDENT_AMBULATORY_CARE_PROVIDER_SITE_OTHER): Payer: Medicare Other

## 2013-06-02 DIAGNOSIS — E785 Hyperlipidemia, unspecified: Secondary | ICD-10-CM

## 2013-06-02 LAB — LIPID PANEL
Cholesterol: 173 mg/dL (ref 0–200)
HDL: 55 mg/dL (ref >39)
LDL Cholesterol: 96 mg/dL (ref 0–99)
Total CHOL/HDL Ratio: 3.1 Ratio
Triglycerides: 109 mg/dL (ref <150)
VLDL: 22 mg/dL (ref 0–40)

## 2013-06-18 ENCOUNTER — Encounter: Payer: Self-pay | Admitting: Internal Medicine

## 2013-09-26 ENCOUNTER — Other Ambulatory Visit: Payer: Self-pay | Admitting: *Deleted

## 2013-09-26 MED ORDER — TRAMADOL HCL 50 MG PO TABS: ORAL_TABLET | ORAL | Status: DC

## 2013-09-26 NOTE — Telephone Encounter (Signed)
Rx called in 

## 2013-10-24 ENCOUNTER — Other Ambulatory Visit: Payer: Self-pay | Admitting: Internal Medicine

## 2013-10-24 NOTE — Telephone Encounter (Signed)
Rx called in 

## 2014-01-19 ENCOUNTER — Encounter: Payer: Self-pay | Admitting: *Deleted

## 2014-02-19 ENCOUNTER — Other Ambulatory Visit: Payer: Self-pay | Admitting: *Deleted

## 2014-02-19 DIAGNOSIS — I10 Essential (primary) hypertension: Secondary | ICD-10-CM

## 2014-02-19 MED ORDER — HYDROCHLOROTHIAZIDE 25 MG PO TABS: ORAL_TABLET | ORAL | Status: DC

## 2014-02-19 MED ORDER — LISINOPRIL 20 MG PO TABS: ORAL_TABLET | ORAL | Status: DC

## 2014-03-25 ENCOUNTER — Encounter: Payer: Medicare Other | Admitting: Internal Medicine

## 2014-03-30 ENCOUNTER — Encounter: Payer: Medicare Other | Admitting: Internal Medicine

## 2014-04-14 ENCOUNTER — Encounter: Payer: Self-pay | Admitting: Internal Medicine

## 2014-04-14 ENCOUNTER — Ambulatory Visit (INDEPENDENT_AMBULATORY_CARE_PROVIDER_SITE_OTHER): Payer: Medicare Other | Admitting: Internal Medicine

## 2014-04-14 VITALS — BP 141/82 | HR 81 | Temp 98.0°F | Ht 63.5 in | Wt 173.7 lb

## 2014-04-14 DIAGNOSIS — F17210 Nicotine dependence, cigarettes, uncomplicated: Secondary | ICD-10-CM

## 2014-04-14 DIAGNOSIS — J302 Other seasonal allergic rhinitis: Secondary | ICD-10-CM

## 2014-04-14 DIAGNOSIS — F17200 Nicotine dependence, unspecified, uncomplicated: Secondary | ICD-10-CM

## 2014-04-14 DIAGNOSIS — I10 Essential (primary) hypertension: Secondary | ICD-10-CM

## 2014-04-14 MED ORDER — HYDROCHLOROTHIAZIDE 25 MG PO TABS: ORAL_TABLET | ORAL | Status: DC

## 2014-04-14 MED ORDER — LORATADINE 10 MG PO TABS: ORAL_TABLET | ORAL | Status: DC

## 2014-04-14 NOTE — Assessment & Plan Note (Signed)
  Assessment: Progress toward smoking cessation:  smoking the same amount Barriers to progress toward smoking cessation:    Comments: He says he smokes 2-3 cigarettes a day. He says he does not want to discuss smoking cessation; talking about quitting makes him want to smoke more  Plan: Instruction/counseling given:  I counseled patient on the dangers of tobacco use, advised patient to stop smoking, and reviewed strategies to maximize success. Educational resources provided:  [employer] account manager) brochure Self management tools provided:    Medications to assist with smoking cessation:  None Patient agreed to the following self-care plans for smoking cessation:    Other plans: cont to monitor

## 2014-04-14 NOTE — Assessment & Plan Note (Signed)
BP Readings from Last 3 Encounters:  04/14/14 158/77  02/04/13 139/87  01/17/12 129/81    Lab Results  Component Value Date   NA 137 02/04/2013   K 4.1 02/04/2013   CREATININE 0.86 02/04/2013    Assessment: Blood pressure control: mildly elevated Progress toward BP goal:  deteriorated Comments: BP 158/77 then 141/82 on repeat. This is on HCTZ 25 mg daily and lisinopril 20 mg daily. He says he would not want to make any changes or go up on his medicines.  Plan: Medications:  continue current medications Educational resources provided: brochure, handout, video Self management tools provided:   Other plans: He will RTC in 6 months for re-evaluation

## 2014-04-14 NOTE — Patient Instructions (Signed)
It was a pleasure to see you today. We made no changes to your medicines. Please return to clinic or seek medical attention if you have any new or worsening chest pain, trouble breathing, or other worrisome medical condition. We look forward to seeing you again in 6 months.  Lottie Mussel, MD  General Instructions:   Please try to bring all your medicines next time. This will help [place] keep you safe from mistakes.   Progress Toward Treatment Goals:  Treatment Goal 04/14/2014  Blood pressure deteriorated  Stop smoking smoking the same amount    Self Care Goals & Plans:  Self Care Goal 04/14/2014  Manage my medications take my medicines as prescribed; bring my medications to every visit; refill my medications on time; follow the sick day instructions if I am sick  Monitor my health -  Eat healthy foods eat more vegetables; eat fruit for snacks and desserts; eat baked foods instead of fried foods; eat foods that are low in salt; eat smaller portions  Be physically active find an activity I enjoy  Stop smoking -    No flowsheet data found.   Care Management & Community Referrals:  Referral 02/04/2013  Referrals made to community resources transportation

## 2014-04-14 NOTE — Progress Notes (Signed)
   Subjective:    Patient ID: Antonio [name], male    DOB: 27-Jun-1955, 59 y.o.   MRN: [id number]  HPI  Mr Laskowski is a 59 year old man with congenital varus deformity, HTN, COPD, GERD, tobacco abuse here for routine check-up. Please see problem-based charting assessment and plan note for further details of medical issues addressed at today's visit.   Review of Systems  Constitutional: Negative for fever, chills and diaphoresis.  Respiratory: Negative for cough and shortness of breath.   Cardiovascular: Negative for chest pain.  Gastrointestinal: Negative for nausea, vomiting, abdominal pain, diarrhea and constipation.  Neurological: Negative for weakness, light-headedness, numbness and headaches.       Objective:   Physical Exam  Constitutional: He is oriented to person, place, and time. He appears well-developed and well-nourished. No distress.  HENT:  Head: Normocephalic and atraumatic.  Mouth/Throat: Oropharynx is clear and moist.  Eyes: EOM are normal. Pupils are equal, round, and reactive to light.  Cardiovascular: Normal rate, regular rhythm, normal heart sounds and intact distal pulses.  Exam reveals no gallop and no friction rub.   No murmur heard. Pulmonary/Chest: Effort normal and breath sounds normal. No respiratory distress. He has no wheezes.  Abdominal: Soft. Bowel sounds are normal. He exhibits no distension. There is no tenderness.  Musculoskeletal:  Varus deformity in LE evident on standing  Neurological: He is alert and oriented to person, place, and time.  Skin: He is not diaphoretic.  Vitals reviewed.         Assessment & Plan:

## 2014-04-14 NOTE — Progress Notes (Signed)
Internal Medicine Clinic Attending  Case discussed with Dr. Rothman at the time of the visit.  We reviewed the resident's history and exam and pertinent patient test results.  I agree with the assessment, diagnosis, and plan of care documented in the resident's note. 

## 2014-04-29 ENCOUNTER — Other Ambulatory Visit: Payer: Self-pay | Admitting: *Deleted

## 2014-04-30 MED ORDER — TRAMADOL HCL 50 MG PO TABS: 50.0000 mg | ORAL_TABLET | Freq: Four times a day (QID) | ORAL | Status: DC | PRN

## 2014-04-30 NOTE — Telephone Encounter (Signed)
Rx called in to pharmacy. 

## 2014-06-10 ENCOUNTER — Encounter: Payer: Self-pay | Admitting: *Deleted

## 2014-06-11 ENCOUNTER — Other Ambulatory Visit: Payer: Self-pay | Admitting: Internal Medicine

## 2014-08-19 ENCOUNTER — Encounter: Payer: Self-pay | Admitting: Internal Medicine

## 2014-08-19 ENCOUNTER — Ambulatory Visit (INDEPENDENT_AMBULATORY_CARE_PROVIDER_SITE_OTHER): Payer: Medicare Other | Admitting: Internal Medicine

## 2014-08-19 VITALS — BP 130/70 | HR 80 | Temp 98.0°F | Ht 63.5 in | Wt 174.7 lb

## 2014-08-19 DIAGNOSIS — F17200 Nicotine dependence, unspecified, uncomplicated: Secondary | ICD-10-CM

## 2014-08-19 DIAGNOSIS — F17210 Nicotine dependence, cigarettes, uncomplicated: Secondary | ICD-10-CM

## 2014-08-19 DIAGNOSIS — M170 Bilateral primary osteoarthritis of knee: Secondary | ICD-10-CM | POA: Diagnosis not present

## 2014-08-19 DIAGNOSIS — E785 Hyperlipidemia, unspecified: Secondary | ICD-10-CM | POA: Diagnosis present

## 2014-08-19 DIAGNOSIS — I10 Essential (primary) hypertension: Secondary | ICD-10-CM | POA: Diagnosis not present

## 2014-08-19 NOTE — Assessment & Plan Note (Signed)
He says that the Velcro straps on his knee braces are wearing out and he needs replacements. Told him to speak with the store he got the braces from and to give them our number and let us know what needs to be ordered and replaced.

## 2014-08-19 NOTE — Progress Notes (Signed)
Internal Medicine Clinic Attending  I saw and evaluated the patient.  I personally confirmed the key portions of the history and exam documented by Dr. Maryellen Pile and I reviewed pertinent patient test results.  The assessment, diagnosis, and plan were formulated together and I agree with the documentation in the resident's note.  59 year old male with hypertension to follow up with new PCP. He has no complaints. We will repeat his blood pressure measurement to reassure. We reviewed his health maintenance and medication history. We will do blood work for lipid levels and reassess need for statin. Madilyn Fireman MD MPH 08/19/2014 2:14 PM

## 2014-08-19 NOTE — Assessment & Plan Note (Signed)
BP Readings from Last 3 Encounters:  08/19/14 130/70  04/14/14 141/82  02/04/13 139/87    Lab Results  Component Value Date   NA 137 02/04/2013   K 4.1 02/04/2013   CREATININE 0.86 02/04/2013    Assessment: Blood pressure control:  good control Progress toward BP goal:   at goal Comments: BP 149/74 initially then 130/70 after sitting and resting for several minutes. He does not check his BP at home. This is on HCTZ 25 mg daily and lisinopril 20 mg daily. He is at goal and will continue these medications.   Plan: Medications:  continue current medications Educational resources provided:   Self management tools provided:   Other plans: He will RTC in 3 months for re-evaluation

## 2014-08-19 NOTE — Assessment & Plan Note (Addendum)
Patient was started on pravastatin 40 mg qHS in January of 2015 for high total cholesterol and elevated LDL. He says he was told to continue the medication for 1 year and then stop. Repeat Lipid panel on 06/02/13 showed improvement in total cholesterol and LDL. There is no documentation indicating that Antonio [name] was told to discontinue taking pravastatin. Ordered a lipid panel today to assess his need to be restarted on a statin.  Lipid panel unremarkable. No need to re-start statin at this time.

## 2014-08-19 NOTE — Progress Notes (Signed)
Patient ID: Antonio [name], male   DOB: 1955/08/25, 59 y.o.   MRN: [id number]   Subjective:   Patient ID: Antonio [name] male   DOB: [birth year] 59 y.o.   MRN: [id number]  HPI: Antonio [name] is a 59 y.o. man with congenital varus deformity, HTN, COPD with tobacco use here today for routine check-up with new PCP. He has no complaints. Please see problem-based charting assessment and plan note for further details of medical issues addressed at today's visit.    Past Medical History  Diagnosis Date  . GERD (gastroesophageal reflux disease)   . COPD (chronic obstructive pulmonary disease)   . Hypertension   . Tobacco abuse   . Arthritis   . Colonic polyp   . Chronic cough   . 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 Allergy     allergic rhinitis  . Rectal bleeding     colonoscopy in 01/2006  . Dysphagia     normal EGD except for mild gastritis in 01/2006  . Rectal polyp     01/2006 with path showing it to be benign, though with a question that it was incomplete biopsy, next scheduled coloscopy 2010  . Varus deformity of knee     bilateral, congenital   Current Outpatient Prescriptions  Medication Sig Dispense Refill  . hydrochlorothiazide (HYDRODIURIL) 25 MG tablet TAKE ONE TABLET BY MOUTH EVERY DAY 90 tablet 1  . lisinopril (PRINIVIL,ZESTRIL) 20 MG tablet TAKE ONE TABLET BY MOUTH ONCE DAILY 30 tablet 2  . loratadine (CLARITIN) 10 MG tablet TAKE ONE TABLET BY MOUTH EVERY DAY 90 tablet 1  . pravastatin (PRAVACHOL) 40 MG tablet Take 1 tablet (40 mg total) by mouth every evening. 30 tablet 11  . traMADol (ULTRAM) 50 MG tablet Take 1-2 tablets (50-100 mg total) by mouth every 6 (six) hours as needed. for pain 120 tablet 2   No current facility-administered medications for this visit.   Family History  Problem Relation Age of Onset  . Cancer Mother   . Hypertension Father   . Diabetes Father    History   Social History  . Marital Status: Single   Spouse Name: N/A  . Number of Children: N/A  . Years of Education: N/A   Social History Main Topics  . Smoking status: Current Every Day Smoker -- 0.20 packs/day for 3 years    Types: Cigarettes  . Smokeless tobacco: Not on file  . Alcohol Use: 2.4 oz/week    4 Glasses of wine per week  . Drug Use: No  . Sexual Activity: Not on file   Other Topics Concern  . None   Social History Narrative   Currently awaiting disability.   Cell# [phone]   Review of Systems: Review of Systems  Constitutional: Negative for fever and chills.  Eyes: Negative for blurred vision.  Respiratory: Negative for cough, shortness of breath and wheezing.   Cardiovascular: Negative for chest pain.  Gastrointestinal: Negative for nausea, vomiting, diarrhea and constipation.  Neurological: Negative for headaches.   Objective:  Physical Exam: Filed Vitals:   08/19/14 1334 08/19/14 1417  BP: 149/74 130/70  Pulse: 85 80  Temp: 98 F (36.7 C)   TempSrc: Oral   Height: 5' 3.5" (1.613 m)   Weight: 79.243 kg (174 lb 11.2 oz)   SpO2: 100%    General: Well-developed, well nourished black male in no acute distress Cardiac: RRR, no rubs, murmurs or gallops Pulm: clear to auscultation bilaterally,  moving normal volumes of air Abd: soft, nontender, nondistended, BS present Ext: warm and well perfused, no pedal edema Neuro: alert and oriented X3 MSK: Varus deformity in bilateral LE   Assessment & Plan:  Please refer to problem based charting.

## 2014-08-19 NOTE — Patient Instructions (Addendum)
Thank you for coming in today Mr. Cid.  Please continue to take your medications and remember to bring them with you to your next visit. We will get in touch with you with the result of your Cholesterol test and let you know if you will need to go back on a Cholesterol medication. If you get in touch with where you got your knee brace and have them contact us to let us know what needs to be replaced, we would be happy to take care of that for you.   It was a pleasure meeting you today.

## 2014-08-19 NOTE — Assessment & Plan Note (Signed)
  Assessment: Progress toward smoking cessation:   smoking less Barriers to progress toward smoking cessation:   personal Comments: He says that he smokes 1-2 cigarettes a day and is quitting at his own pace. He is not interested in discussing quitting or any help quitting.  Plan: Instruction/counseling given:  I counseled patient on the dangers of tobacco use, advised patient to stop smoking, and reviewed strategies to maximize success. Educational resources provided:    Self management tools provided:    Medications to assist with smoking cessation:  None Patient agreed to the following self-care plans for smoking cessation:    Other plans: continue to monitor

## 2014-08-20 LAB — LIPID PANEL
CHOLESTEROL: 183 mg/dL (ref 0–200)
HDL: 58 mg/dL (ref >=40)
LDL Cholesterol: 109 mg/dL — ABNORMAL HIGH (ref 0–99)
TRIGLYCERIDES: 79 mg/dL (ref <150)
Total CHOL/HDL Ratio: 3.2 Ratio
VLDL: 16 mg/dL (ref 0–40)

## 2014-09-14 ENCOUNTER — Other Ambulatory Visit: Payer: Self-pay | Admitting: Internal Medicine

## 2014-09-14 NOTE — Telephone Encounter (Signed)
Pls sch appt in Oct Dr [name]

## 2014-10-02 ENCOUNTER — Encounter: Payer: Self-pay | Admitting: *Deleted

## 2014-10-14 ENCOUNTER — Other Ambulatory Visit: Payer: Self-pay | Admitting: Internal Medicine

## 2014-11-17 ENCOUNTER — Encounter: Payer: Medicare Other | Admitting: Internal Medicine

## 2014-11-23 ENCOUNTER — Other Ambulatory Visit: Payer: Self-pay | Admitting: Internal Medicine

## 2014-11-24 ENCOUNTER — Encounter: Payer: Self-pay | Admitting: Internal Medicine

## 2014-11-24 ENCOUNTER — Ambulatory Visit (INDEPENDENT_AMBULATORY_CARE_PROVIDER_SITE_OTHER): Payer: Medicare Other | Admitting: Internal Medicine

## 2014-11-24 VITALS — BP 136/81 | HR 91 | Temp 98.3°F | Ht 63.5 in | Wt 177.3 lb

## 2014-11-24 DIAGNOSIS — F17200 Nicotine dependence, unspecified, uncomplicated: Secondary | ICD-10-CM

## 2014-11-24 DIAGNOSIS — I10 Essential (primary) hypertension: Secondary | ICD-10-CM

## 2014-11-24 DIAGNOSIS — M179 Osteoarthritis of knee, unspecified: Secondary | ICD-10-CM

## 2014-11-24 DIAGNOSIS — Z23 Encounter for immunization: Secondary | ICD-10-CM | POA: Diagnosis not present

## 2014-11-24 DIAGNOSIS — Z0000 Encounter for general adult medical examination without abnormal findings: Secondary | ICD-10-CM | POA: Diagnosis not present

## 2014-11-24 DIAGNOSIS — E785 Hyperlipidemia, unspecified: Secondary | ICD-10-CM | POA: Diagnosis present

## 2014-11-24 DIAGNOSIS — M1710 Unilateral primary osteoarthritis, unspecified knee: Secondary | ICD-10-CM

## 2014-11-24 DIAGNOSIS — IMO0002 Reserved for concepts with insufficient information to code with codable children: Secondary | ICD-10-CM

## 2014-11-24 MED ORDER — TRAMADOL HCL 50 MG PO TABS: 50.0000 mg | ORAL_TABLET | Freq: Four times a day (QID) | ORAL | Status: DC | PRN

## 2014-11-24 MED ORDER — PRAVASTATIN SODIUM 40 MG PO TABS: 40.0000 mg | ORAL_TABLET | Freq: Every evening | ORAL | Status: DC

## 2014-11-24 NOTE — Patient Instructions (Signed)
Thank you for coming in today  -I am starting you back on the Pravastatin today. I have sent the prescription into your Pharmacy.  -We will check some blood work today -Return to clinic in 3 months

## 2014-11-24 NOTE — Progress Notes (Signed)
Patient ID: Antonio [name], male   DOB: 17-Jun-1955, 59 y.o.   MRN: [id number]   Subjective:   Patient ID: Antonio [name] male   DOB: Mar 19, 1955 59 y.o.   MRN: [id number]  HPI: [name] is a 59 y.o. male with a PMH listed below here today for follow up of his HTN and HLD.   For details of today's visit and the status of his chronic medical problems please refer to assessment and plan.   Past Medical History  Diagnosis Date  . GERD (gastroesophageal reflux disease)   . COPD (chronic obstructive pulmonary disease) ([place])   . Hypertension   . Tobacco abuse   . Arthritis   . Colonic polyp   . Chronic cough   . 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 Allergy     allergic rhinitis  . Rectal bleeding     colonoscopy in 01/2006  . Dysphagia     normal EGD except for mild gastritis in 01/2006  . Rectal polyp     01/2006 with path showing it to be benign, though with a question that it was incomplete biopsy, next scheduled coloscopy 2010  . Varus deformity of knee     bilateral, congenital   Current Outpatient Prescriptions  Medication Sig Dispense Refill  . hydrochlorothiazide (HYDRODIURIL) 25 MG tablet TAKE ONE TABLET BY MOUTH ONCE DAILY 90 tablet 0  . lisinopril (PRINIVIL,ZESTRIL) 20 MG tablet TAKE ONE TABLET BY MOUTH ONCE DAILY 30 tablet 11  . loratadine (CLARITIN) 10 MG tablet TAKE ONE TABLET BY MOUTH ONCE DAILY 90 tablet 0  . pravastatin (PRAVACHOL) 40 MG tablet Take 1 tablet (40 mg total) by mouth every evening. 90 tablet 3  . traMADol (ULTRAM) 50 MG tablet Take 1-2 tablets (50-100 mg total) by mouth every 6 (six) hours as needed. for pain 120 tablet 2   No current facility-administered medications for this visit.   Family History  Problem Relation Age of Onset  . Cancer Mother   . Hypertension Father   . Diabetes Father    Social History   Social History  . Marital Status: Single    Spouse Name: N/A  . Number of Children: N/A  .  Years of Education: N/A   Social History Main Topics  . Smoking status: Current Every Day Smoker -- 0.20 packs/day for 3 years    Types: Cigarettes  . Smokeless tobacco: None  . Alcohol Use: 2.4 oz/week    4 Glasses of wine per week  . Drug Use: No  . Sexual Activity: Not Asked   Other Topics Concern  . None   Social History Narrative   Currently awaiting disability.   Cell# [phone]   Review of Systems: Review of Systems  Constitutional: Negative for fever and chills.  Respiratory: Negative for shortness of breath.   Cardiovascular: Negative for chest pain.  Gastrointestinal: Negative for nausea, vomiting, abdominal pain and diarrhea.  Genitourinary: Negative for dysuria.  Neurological: Negative for dizziness and headaches.   Objective:  Physical Exam: Filed Vitals:   11/24/14 1344  BP: 136/81  Pulse: 91  Temp: 98.3 F (36.8 C)  TempSrc: Oral  Height: 5' 3.5" (1.613 m)  Weight: 177 lb 4.8 oz (80.423 kg)  SpO2: 100%   PHYSICAL EXAM General: Well-developed, well nourished black male in no acute distress Cardiac: RRR, no rubs, murmurs or gallops Pulm: clear to auscultation bilaterally, moving normal volumes of air Abd: soft, nontender, nondistended, BS present  Ext: warm and well perfused, no pedal edema Neuro: alert and oriented X3 MSK: Varus deformity in bilateral LE  Assessment & Plan:   Case discussed with Dr.Narendra. Please refer to Problem based carting for further details of today's visit.

## 2014-11-24 NOTE — Assessment & Plan Note (Addendum)
ASCVD 10 year risk 19.7% with a lifetime risk of 69%. Recommend moderate to high intensity statin.  Will start pravastatin 40 mg daily today.  LFTs RTC in 3 months for recheck

## 2014-11-25 ENCOUNTER — Other Ambulatory Visit: Payer: Self-pay | Admitting: Internal Medicine

## 2014-11-25 LAB — HEPATIC FUNCTION PANEL
ALBUMIN: 4.8 g/dL (ref 3.5–5.5)
ALK PHOS: 69 IU/L (ref 39–117)
ALT: 22 IU/L (ref 0–44)
AST: 24 IU/L (ref 0–40)
BILIRUBIN TOTAL: 0.5 mg/dL (ref 0.0–1.2)
BILIRUBIN, DIRECT: 0.14 mg/dL (ref 0.00–0.40)
TOTAL PROTEIN: 7.2 g/dL (ref 6.0–8.5)

## 2014-11-25 NOTE — Telephone Encounter (Signed)
Pt states meds are not at the pharmacy, please call pt back.

## 2014-11-25 NOTE — Telephone Encounter (Signed)
rx called into pharmacy

## 2014-11-25 NOTE — Telephone Encounter (Signed)
Pravastatin was ready for pick up, called tramadol in to [facility]

## 2014-12-01 DIAGNOSIS — Z7189 Other specified counseling: Secondary | ICD-10-CM | POA: Insufficient documentation

## 2014-12-01 NOTE — Assessment & Plan Note (Signed)
Patient received flu vaccine today. 

## 2014-12-01 NOTE — Assessment & Plan Note (Signed)
  Assessment: Progress toward smoking cessation:   none Barriers to progress toward smoking cessation:    motivation Comments: Patient reports he is smoking 2-3 cigarettes a day. Continues to report he can quit on his own and is not interested in any help quitting.   Plan: Instruction/counseling given:  I counseled patient on the dangers of tobacco use, advised patient to stop smoking, and reviewed strategies to maximize success. Educational resources provided:    Self management tools provided:    Medications to assist with smoking cessation:  None Patient agreed to the following self-care plans for smoking cessation:    Other plans: Continue to encourage quitting

## 2014-12-01 NOTE — Assessment & Plan Note (Signed)
Patient reports pain in well controlled on tramadol which improves his mobility and quality of life. Will refill tramadol prescription today.

## 2014-12-01 NOTE — Assessment & Plan Note (Signed)
BP Readings from Last 3 Encounters:  11/24/14 136/81  08/19/14 130/70  04/14/14 141/82    Lab Results  Component Value Date   NA 137 02/04/2013   K 4.1 02/04/2013   CREATININE 0.86 02/04/2013    Assessment: Blood pressure control:  good control Progress toward BP goal:   at goal Comments: Patient is currently on hctz 25 mg daily and lisinopril 20 mg daily.  Plan: Medications:  continue current medications Educational resources provided:   Self management tools provided:   Other plans: RTC in 3 months

## 2014-12-02 NOTE — Progress Notes (Signed)
Internal Medicine Clinic Attending  I saw and evaluated the patient.  I personally confirmed the key portions of the history and exam documented by Dr. Boswell and I reviewed pertinent patient test results.  The assessment, diagnosis, and plan were formulated together and I agree with the documentation in the resident's note. 

## 2015-01-13 ENCOUNTER — Other Ambulatory Visit: Payer: Self-pay | Admitting: Internal Medicine

## 2015-02-24 ENCOUNTER — Ambulatory Visit (INDEPENDENT_AMBULATORY_CARE_PROVIDER_SITE_OTHER): Payer: Medicare Other | Admitting: Internal Medicine

## 2015-02-24 ENCOUNTER — Encounter: Payer: Self-pay | Admitting: Internal Medicine

## 2015-02-24 VITALS — BP 145/75 | HR 90 | Temp 98.5°F | Ht 63.5 in | Wt 179.8 lb

## 2015-02-24 DIAGNOSIS — Z0000 Encounter for general adult medical examination without abnormal findings: Secondary | ICD-10-CM

## 2015-02-24 DIAGNOSIS — I10 Essential (primary) hypertension: Secondary | ICD-10-CM

## 2015-02-24 DIAGNOSIS — M25552 Pain in left hip: Secondary | ICD-10-CM

## 2015-02-24 DIAGNOSIS — E785 Hyperlipidemia, unspecified: Secondary | ICD-10-CM | POA: Diagnosis not present

## 2015-02-24 DIAGNOSIS — M5430 Sciatica, unspecified side: Secondary | ICD-10-CM

## 2015-02-24 MED ORDER — GABAPENTIN 300 MG PO CAPS: 300.0000 mg | ORAL_CAPSULE | Freq: Three times a day (TID) | ORAL | Status: DC

## 2015-02-24 NOTE — Patient Instructions (Signed)
Thank you for coming in today  -I am starting you on a new medication called gabapentin today. You can take it up to 3 times a day. If you have any problems please let us know.  -I am checking some blood work today, I will call you if there are any problems -I would like to see you back in 6 months for follow up

## 2015-02-25 LAB — LIPID PANEL
CHOLESTEROL TOTAL: 161 mg/dL (ref 100–199)
Chol/HDL Ratio: 2.7 ratio units (ref 0.0–5.0)
HDL: 60 mg/dL (ref >39)
LDL CALC: 84 mg/dL (ref 0–99)
TRIGLYCERIDES: 85 mg/dL (ref 0–149)
VLDL Cholesterol Cal: 17 mg/dL (ref 5–40)

## 2015-02-25 LAB — HIV ANTIBODY (ROUTINE TESTING W REFLEX): HIV Screen 4th Generation wRfx: NONREACTIVE

## 2015-02-25 LAB — HEPATITIS C ANTIBODY: Hep C Virus Ab: <0.1 s/co ratio (ref 0.0–0.9)

## 2015-02-25 NOTE — Assessment & Plan Note (Signed)
BP Readings from Last 3 Encounters:  02/24/15 145/75  11/24/14 136/81  08/19/14 130/70    Lab Results  Component Value Date   NA 137 02/04/2013   K 4.1 02/04/2013   CREATININE 0.86 02/04/2013   Assessment: Patient's blood pressure mildly elevated today at 145/75. He is currently on HCTZ 25 mg daily and Lisinopril 20 mg daily. Reports compliance with his medications. Pain reports significant pain in his left leg today that may be contributing to the mild elevation.   Plan: Will continue current medications today and continue to monitor.

## 2015-02-25 NOTE — Progress Notes (Signed)
Patient ID: Antonio [name], male   DOB: 23-Jan-1956, 60 y.o.   MRN: [id number]   Subjective:   Patient ID: Antonio [name] male   DOB: May 16, 1955 60 y.o.   MRN: [id number]  HPI: [name] is a 60 y.o. male with a past medical history listed below here today for follow up of his HTN and HLD.   For details of today's visit and the status of his chronic medical issues please refer to the assessment and plan.  Past Medical History  Diagnosis Date  . GERD (gastroesophageal reflux disease)   . COPD (chronic obstructive pulmonary disease) ([place])   . Hypertension   . Tobacco abuse   . Arthritis   . Colonic polyp   . Chronic cough   . 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 Allergy     allergic rhinitis  . Rectal bleeding     colonoscopy in 01/2006  . Dysphagia     normal EGD except for mild gastritis in 01/2006  . Rectal polyp     01/2006 with path showing it to be benign, though with a question that it was incomplete biopsy, next scheduled coloscopy 2010  . Varus deformity of knee     bilateral, congenital   Current Outpatient Prescriptions  Medication Sig Dispense Refill  . EQ LORATADINE 10 MG tablet TAKE ONE TABLET BY MOUTH ONCE DAILY 90 tablet 0  . gabapentin (NEURONTIN) 300 MG capsule Take 1 capsule (300 mg total) by mouth 3 (three) times daily. 90 capsule 0  . hydrochlorothiazide (HYDRODIURIL) 25 MG tablet TAKE ONE TABLET BY MOUTH ONCE DAILY 90 tablet 0  . lisinopril (PRINIVIL,ZESTRIL) 20 MG tablet TAKE ONE TABLET BY MOUTH ONCE DAILY 30 tablet 11  . pravastatin (PRAVACHOL) 40 MG tablet Take 1 tablet (40 mg total) by mouth every evening. 90 tablet 3  . traMADol (ULTRAM) 50 MG tablet Take 1-2 tablets (50-100 mg total) by mouth every 6 (six) hours as needed. for pain 120 tablet 2   No current facility-administered medications for this visit.   Family History  Problem Relation Age of Onset  . Cancer Mother   . Hypertension Father   . Diabetes  Father    Social History   Social History  . Marital Status: Single    Spouse Name: N/A  . Number of Children: N/A  . Years of Education: N/A   Social History Main Topics  . Smoking status: Current Every Day Smoker -- 0.20 packs/day for 3 years    Types: Cigarettes  . Smokeless tobacco: None  . Alcohol Use: 2.4 oz/week    4 Glasses of wine per week  . Drug Use: No  . Sexual Activity: Not Asked   Other Topics Concern  . None   Social History Narrative   Currently awaiting disability.   Cell# [phone]   Review of Systems: Review of Systems  Eyes: Negative for blurred vision.  Gastrointestinal: Negative for nausea, vomiting and diarrhea.  Musculoskeletal: Negative for myalgias and falls.  Neurological: Negative for headaches.   Objective:  Physical Exam: Filed Vitals:   02/24/15 1419  BP: 145/75  Pulse: 90  Temp: 98.5 F (36.9 C)  TempSrc: Oral  Height: 5' 3.5" (1.613 m)  Weight: 179 lb 12.8 oz (81.557 kg)  SpO2: 98%   PHYSICAL EXAM General: Well-developed, well nourished black male in no acute distress Cardiac: RRR, no rubs, murmurs or gallops Pulm: clear to auscultation bilaterally, moving normal volumes of  air Abd: soft, nontender, nondistended, BS present MSK: Varus deformity in bilateral LE  Assessment & Plan:   Case discussed with Dr. Eppie [name]. Please refer to Problem based charting for further details of today's visit.

## 2015-02-25 NOTE — Assessment & Plan Note (Signed)
Patient reports compliance with the Pravastatin started at last visit. Currently on Pravastatin 40 mg daily. LDL previously 183, reduction to 161 on lipid panel today. Not at 30-50% reduction goal for moderate intensity statin. Denies any side effects, no myalgias or diarrhea.   Plan  Continue Pravastatin 40 mg daily Lipid panel today with 12% reduction in LDL

## 2015-02-25 NOTE — Assessment & Plan Note (Signed)
Patient reports left hip pain that radiates down his left leg. Denies any recent falls or trauma. Reports the pain as sharp, originating in his left hip with occasional sharp stabbing pain down his left leg. Reports this has been on ongoing issue for the past several years but has gotten worse over the last several weeks. He reports sitting on hard chairs worsens his pain. He has tried tramadol (on for osteoarthritic pain) with no relief. Denies any alarm symptoms, no paresthesias, weakness, loss of bowel or bladder function. No point tenderness on exam. Reports he has been told in the past that he has a pinched nerve in his lumbar area. No imaging in our records.   Plan  Likely sciatic pain in nature. Given his significant varus deformity it is likely he has some degree of narrowing in his lumbar region. Will give Gabapentin 300 mg tid today. Consider imaging/nerve conduction study in the future if no improvement in symptoms.

## 2015-02-25 NOTE — Assessment & Plan Note (Signed)
HCV negative HIV non-reactive

## 2015-03-02 NOTE — Progress Notes (Signed)
Case discussed with Dr. Boswell at the time of the visit. We reviewed the resident's history and exam and pertinent patient test results. I agree with the assessment, diagnosis, and plan of care documented in the resident's note. 

## 2015-03-27 ENCOUNTER — Other Ambulatory Visit: Payer: Self-pay | Admitting: Internal Medicine

## 2015-03-29 ENCOUNTER — Telehealth: Payer: Self-pay | Admitting: Internal Medicine

## 2015-03-29 NOTE — Telephone Encounter (Signed)
THE PAIN ISN'T BETTER AFTER TAKING GABAPENTIN, 300 MG

## 2015-04-02 NOTE — Telephone Encounter (Signed)
Advise patient to increase gabapentin to 600 mg TID. Move up appointment with Dr [name] or another resident to be seen in 2 weeks.

## 2015-04-05 NOTE — Telephone Encounter (Signed)
Have tried to call pt twice, 1 time fri and today lm for rtc

## 2015-04-06 NOTE — Telephone Encounter (Signed)
Phone has now been disconnected

## 2015-04-09 ENCOUNTER — Other Ambulatory Visit: Payer: Self-pay | Admitting: Internal Medicine

## 2015-04-19 ENCOUNTER — Other Ambulatory Visit: Payer: Self-pay | Admitting: Internal Medicine

## 2015-04-19 MED ORDER — TRAMADOL HCL 50 MG PO TABS: 50.0000 mg | ORAL_TABLET | Freq: Four times a day (QID) | ORAL | Status: DC | PRN

## 2015-04-19 NOTE — Telephone Encounter (Signed)
Please have patient schedule an appointment to address gabapentin and pain. Thanks.

## 2015-04-19 NOTE — Telephone Encounter (Signed)
Last visit 1/11 Last filled at pharm 1/16 Last uds ?

## 2015-04-19 NOTE — Telephone Encounter (Signed)
Patient requesting a  Refill on Tramadol.

## 2015-04-20 NOTE — Telephone Encounter (Signed)
Called to pharm, sent charsetta a note for appt

## 2015-04-27 NOTE — Telephone Encounter (Signed)
Called patient this am to inform him that he needs to be seen before July 2017 to address his pain.  No answer, but message was left asking to please give me a call back.  Dr. [name] has no available appt slots so pt is scheduled to see Dr. Ronnald Ramp on 05-13-15 @ 1:15 pm.    I will also send him an appt card in the mail.

## 2015-05-13 ENCOUNTER — Encounter: Payer: Self-pay | Admitting: Internal Medicine

## 2015-05-13 ENCOUNTER — Ambulatory Visit (INDEPENDENT_AMBULATORY_CARE_PROVIDER_SITE_OTHER): Payer: Medicare Other | Admitting: Internal Medicine

## 2015-05-13 VITALS — BP 137/66 | HR 75 | Temp 98.0°F | Ht 63.5 in | Wt 175.6 lb

## 2015-05-13 DIAGNOSIS — M5430 Sciatica, unspecified side: Secondary | ICD-10-CM

## 2015-05-13 DIAGNOSIS — M5432 Sciatica, left side: Secondary | ICD-10-CM | POA: Diagnosis not present

## 2015-05-13 MED ORDER — CYCLOBENZAPRINE HCL 10 MG PO TABS: 10.0000 mg | ORAL_TABLET | Freq: Every day | ORAL | Status: DC

## 2015-05-13 MED ORDER — GABAPENTIN 300 MG PO CAPS: 300.0000 mg | ORAL_CAPSULE | Freq: Three times a day (TID) | ORAL | Status: DC

## 2015-05-13 NOTE — Patient Instructions (Signed)
1. Please make a follow up appointment for 3 months unless you feel like you need to come back sooner.   2. Please take all medications as previously prescribed with the following changes:  Continue Neurontin 300 mg three times daily  Use Flexeril 10 mg once at bed time ONLY AS NEEDED for muscle spasm type pain.   3. If you have worsening of your symptoms or new symptoms arise, please call the clinic ([phone]), or go to the ER immediately if symptoms are severe.  Sciatica Sciatica is pain, weakness, numbness, or tingling along the path of the sciatic nerve. The nerve starts in the lower back and runs down the back of each leg. The nerve controls the muscles in the lower leg and in the back of the knee, while also providing sensation to the back of the thigh, lower leg, and the sole of your foot. Sciatica is a symptom of another medical condition. For instance, nerve damage or certain conditions, such as a herniated disk or bone spur on the spine, pinch or put pressure on the sciatic nerve. This causes the pain, weakness, or other sensations normally associated with sciatica. Generally, sciatica only affects one side of the body. CAUSES   Herniated or slipped disc.  Degenerative disk disease.  A pain disorder involving the narrow muscle in the buttocks (piriformis syndrome).  Pelvic injury or fracture.  Pregnancy.  Tumor (rare). SYMPTOMS  Symptoms can vary from mild to very severe. The symptoms usually travel from the low back to the buttocks and down the back of the leg. Symptoms can include:  Mild tingling or dull aches in the lower back, leg, or hip.  Numbness in the back of the calf or sole of the foot.  Burning sensations in the lower back, leg, or hip.  Sharp pains in the lower back, leg, or hip.  Leg weakness.  Severe back pain inhibiting movement. These symptoms may get worse with coughing, sneezing, laughing, or prolonged sitting or standing. Also, being overweight may  worsen symptoms. DIAGNOSIS  Your caregiver will perform a physical exam to look for common symptoms of sciatica. He or she may ask you to do certain movements or activities that would trigger sciatic nerve pain. Other tests may be performed to find the cause of the sciatica. These may include:  Blood tests.  X-rays.  Imaging tests, such as an MRI or CT scan. TREATMENT  Treatment is directed at the cause of the sciatic pain. Sometimes, treatment is not necessary and the pain and discomfort goes away on its own. If treatment is needed, your caregiver may suggest:  Over-the-counter medicines to relieve pain.  Prescription medicines, such as anti-inflammatory medicine, muscle relaxants, or narcotics.  Applying heat or ice to the painful area.  Steroid injections to lessen pain, irritation, and inflammation around the nerve.  Reducing activity during periods of pain.  Exercising and stretching to strengthen your abdomen and improve flexibility of your spine. Your caregiver may suggest losing weight if the extra weight makes the back pain worse.  Physical therapy.  Surgery to eliminate what is pressing or pinching the nerve, such as a bone spur or part of a herniated disk. HOME CARE INSTRUCTIONS   Only take over-the-counter or prescription medicines for pain or discomfort as directed by your caregiver.  Apply ice to the affected area for 20 minutes, 3-4 times a day for the first 48-72 hours. Then try heat in the same way.  Exercise, stretch, or perform your usual  activities if these do not aggravate your pain.  Attend physical therapy sessions as directed by your caregiver.  Keep all follow-up appointments as directed by your caregiver.  Do not wear high heels or shoes that do not provide proper support.  Check your mattress to see if it is too soft. A firm mattress may lessen your pain and discomfort. SEEK IMMEDIATE MEDICAL CARE IF:   You lose control of your bowel or bladder  (incontinence).  You have increasing weakness in the lower back, pelvis, buttocks, or legs.  You have redness or swelling of your back.  You have a burning sensation when you urinate.  You have pain that gets worse when you lie down or awakens you at night.  Your pain is worse than you have experienced in the past.  Your pain is lasting longer than 4 weeks.  You are suddenly losing weight without reason. MAKE SURE YOU:  Understand these instructions.  Will watch your condition.  Will get help right away if you are not doing well or get worse.   This information is not intended to replace advice given to you by your health care provider. Make sure you discuss any questions you have with your health care provider.   Document Released: 01/24/2001 Document Revised: 10/21/2014 Document Reviewed: 06/11/2011 Elsevier Interactive Patient Education [employer].

## 2015-05-13 NOTE — Progress Notes (Signed)
Subjective:   Patient ID: Antonio [name] male   DOB: 07/06/1955 60 y.o.   MRN: [id number]  HPI: [name] is a 60 y.o. male w/ PMHx of HTN, COPD, GERD, DJD, and tobacco abuse, presents to the clinic today for a follow-up visit regarding left leg sciatica. Patient was seen a few months ago for this and was treated with Neurontin 300 ng tid for the pain and he says this helped some, but did not get rid of the pain completely. He did not take this medication for any longer than one month because he thought he was only supposed to do a month trial. Today, he appears quite comfortable, says his pain is manageable. Still describes a burning and shooting pain from the pelvis and hip, down the side of his leg. Sometimes he feels like there is a tightness in the left thigh as well. Feels that this pain sometimes bothers him at night. No red flag symptoms such as saddle anesthesia, incontinence, or weakness in the LE's. Patient has bilateral knee OA and varus deformities in both legs, walks with a cane. Takes Tramadol for his knee pain. Says this also helps for the left leg.    Past Medical History  Diagnosis Date  . GERD (gastroesophageal reflux disease)   . COPD (chronic obstructive pulmonary disease) ([place])   . Hypertension   . Tobacco abuse   . Arthritis   . Colonic polyp   . Chronic cough   . 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 Allergy     allergic rhinitis  . Rectal bleeding     colonoscopy in 01/2006  . Dysphagia     normal EGD except for mild gastritis in 01/2006  . Rectal polyp     01/2006 with path showing it to be benign, though with a question that it was incomplete biopsy, next scheduled coloscopy 2010  . Varus deformity of knee     bilateral, congenital   Current Outpatient Prescriptions  Medication Sig Dispense Refill  . gabapentin (NEURONTIN) 300 MG capsule TAKE ONE CAPSULE BY MOUTH THREE TIMES DAILY 90 capsule 3  . hydrochlorothiazide  (HYDRODIURIL) 25 MG tablet TAKE ONE TABLET BY MOUTH ONCE DAILY 90 tablet 0  . lisinopril (PRINIVIL,ZESTRIL) 20 MG tablet TAKE ONE TABLET BY MOUTH ONCE DAILY 30 tablet 11  . loratadine (CLARITIN) 10 MG tablet TAKE ONE TABLET BY MOUTH ONCE DAILY 90 tablet 0  . pravastatin (PRAVACHOL) 40 MG tablet Take 1 tablet (40 mg total) by mouth every evening. 90 tablet 3  . traMADol (ULTRAM) 50 MG tablet Take 1-2 tablets (50-100 mg total) by mouth every 6 (six) hours as needed. for pain 120 tablet 0   No current facility-administered medications for this visit.    Review of Systems: General: Denies fever, chills, diaphoresis, appetite change and fatigue.  Respiratory: Denies SOB, DOE, cough, and wheezing.   Cardiovascular: Denies chest pain and palpitations.  Gastrointestinal: Denies nausea, vomiting, abdominal pain, and diarrhea.  Genitourinary: Denies dysuria, increased frequency, and flank pain. Endocrine: Denies hot or cold intolerance, polyuria, and polydipsia. Musculoskeletal: Positive for left leg pain. Denies back pain, joint swelling, and gait problem.  Skin: Denies pallor, rash and wounds.  Neurological: Denies dizziness, seizures, syncope, weakness, lightheadedness, numbness and headaches.  Psychiatric/Behavioral: Denies mood changes, and sleep disturbances.  Objective:   Physical Exam: Filed Vitals:   05/13/15 1337  BP: 137/66  Pulse: 75  Temp: 98 F (36.7 C)  TempSrc: Oral  Height: 5' 3.5" (1.613 m)  Weight: 175 lb 9.6 oz (79.652 kg)  SpO2: 100%    General: AA male, alert, cooperative, NAD. HEENT: PERRL, EOMI. Moist mucus membranes Neck: Full range of motion without pain, supple, no lymphadenopathy or carotid bruits Lungs: Clear to ascultation bilaterally, normal work of respiration, no wheezes, rales, rhonchi Heart: RRR, no murmurs, gallops, or rubs Abdomen: Soft, non-tender, non-distended, BS + Extremities: No cyanosis, clubbing, or edema. Tenderness to palpation over left  gluteal region. Mildly positive straight leg raise on the left. No limitations with ROM.  Neurologic: Alert & oriented X3, cranial nerves II-XII intact, strength grossly intact, sensation intact to light touch   Assessment & Plan:   Please see problem based assessment and plan.

## 2015-05-14 NOTE — Assessment & Plan Note (Signed)
Still with the same type of pain, says the neurontin helped somewhat when he was using it. Stopped because he thought he was only supposed to try it for 1 month. No red flag symptoms. Also describes some muscle spasm type pain in the left thigh. Exam with mildly positive straight leg raise on the left. Point tenderness over the left gluteal region.  -Restart Neurontin 300 mg tid. May need an increased dose -Start Flexeril 10 mg qhs for muscle spasm -RTC in 3 months. If continued discomfort, may benefit from referral for injection

## 2015-05-18 NOTE — Progress Notes (Signed)
Internal Medicine Clinic Attending  Case discussed with Dr. Jones at the time of the visit.  We reviewed the resident's history and exam and pertinent patient test results.  I agree with the assessment, diagnosis, and plan of care documented in the resident's note.  

## 2015-06-14 ENCOUNTER — Other Ambulatory Visit: Payer: Self-pay | Admitting: Internal Medicine

## 2015-06-14 NOTE — Telephone Encounter (Signed)
Last appointment 05/13/2015.

## 2015-06-15 NOTE — Telephone Encounter (Signed)
Attempted to contact patient, but no answer.

## 2015-06-16 ENCOUNTER — Other Ambulatory Visit: Payer: Self-pay | Admitting: Internal Medicine

## 2015-06-16 NOTE — Telephone Encounter (Signed)
Called into [facility] on [address].

## 2015-06-16 NOTE — Telephone Encounter (Signed)
Attempted to contact patient, no answer.

## 2015-07-15 ENCOUNTER — Other Ambulatory Visit: Payer: Self-pay | Admitting: Internal Medicine

## 2015-07-19 NOTE — Telephone Encounter (Signed)
Pt requesting hydrochlorothiazide and loratadine to be filled @ [facility].

## 2015-07-20 NOTE — Telephone Encounter (Signed)
Patient upset that his medication has not been filled. Says he is out of his blood pressure medicine. Thanks!

## 2015-07-20 NOTE — Telephone Encounter (Signed)
We apologize for the delay. Please let Antonio [name] know that I have refilled both meds.

## 2015-07-20 NOTE — Telephone Encounter (Signed)
Called patient to notify prescriptions were sent.

## 2015-08-13 ENCOUNTER — Other Ambulatory Visit: Payer: Self-pay

## 2015-08-13 ENCOUNTER — Other Ambulatory Visit: Payer: Self-pay | Admitting: *Deleted

## 2015-08-13 NOTE — Telephone Encounter (Signed)
Pt states tramadol is not at the pharmacy. Please call pt back.

## 2015-08-14 MED ORDER — TRAMADOL HCL 50 MG PO TABS: 50.0000 mg | ORAL_TABLET | Freq: Four times a day (QID) | ORAL | Status: DC | PRN

## 2015-08-16 NOTE — Telephone Encounter (Signed)
Called to pharm 

## 2015-08-16 NOTE — Telephone Encounter (Signed)
Clarified with patient that the Tramadol was sent to the pharmacy and should be ready for pick up

## 2015-08-25 ENCOUNTER — Encounter: Payer: Self-pay | Admitting: Internal Medicine

## 2015-08-25 ENCOUNTER — Ambulatory Visit (INDEPENDENT_AMBULATORY_CARE_PROVIDER_SITE_OTHER): Payer: Medicare Other | Admitting: Internal Medicine

## 2015-08-25 VITALS — BP 127/71 | HR 89 | Temp 98.1°F | Ht 64.2 in | Wt 175.5 lb

## 2015-08-25 DIAGNOSIS — M5432 Sciatica, left side: Secondary | ICD-10-CM | POA: Diagnosis not present

## 2015-08-25 DIAGNOSIS — F17200 Nicotine dependence, unspecified, uncomplicated: Secondary | ICD-10-CM

## 2015-08-25 DIAGNOSIS — M170 Bilateral primary osteoarthritis of knee: Secondary | ICD-10-CM

## 2015-08-25 DIAGNOSIS — IMO0002 Reserved for concepts with insufficient information to code with codable children: Secondary | ICD-10-CM

## 2015-08-25 DIAGNOSIS — Z79891 Long term (current) use of opiate analgesic: Secondary | ICD-10-CM | POA: Diagnosis not present

## 2015-08-25 DIAGNOSIS — I10 Essential (primary) hypertension: Secondary | ICD-10-CM | POA: Diagnosis not present

## 2015-08-25 DIAGNOSIS — M5430 Sciatica, unspecified side: Secondary | ICD-10-CM

## 2015-08-25 DIAGNOSIS — M1710 Unilateral primary osteoarthritis, unspecified knee: Secondary | ICD-10-CM

## 2015-08-25 DIAGNOSIS — Z79899 Other long term (current) drug therapy: Secondary | ICD-10-CM

## 2015-08-25 DIAGNOSIS — F17210 Nicotine dependence, cigarettes, uncomplicated: Secondary | ICD-10-CM | POA: Diagnosis not present

## 2015-08-25 DIAGNOSIS — F1410 Cocaine abuse, uncomplicated: Secondary | ICD-10-CM

## 2015-08-25 MED ORDER — CYCLOBENZAPRINE HCL 10 MG PO TABS: 10.0000 mg | ORAL_TABLET | Freq: Every day | ORAL | Status: DC

## 2015-08-25 MED ORDER — GABAPENTIN 300 MG PO CAPS: 300.0000 mg | ORAL_CAPSULE | Freq: Three times a day (TID) | ORAL | Status: DC

## 2015-08-25 NOTE — Assessment & Plan Note (Signed)
  Assessment: Continues to smoke 2-3 cigarettes a day. No desire to quit at this time.  Plan: Continue to encourage cessation.

## 2015-08-25 NOTE — Assessment & Plan Note (Addendum)
Patient currently on tramadol for chronic knee pain. Pain well controlled. Last took a pill yesterday.   -Check UDS today -Continue Tramadol 50 mg q6hr prn  ADDENDUM UDS positive for cocaine. Will need to address at next visit and prior to any opiate refills.

## 2015-08-25 NOTE — Assessment & Plan Note (Signed)
BP Readings from Last 3 Encounters:  08/25/15 127/71  05/13/15 137/66  02/24/15 145/75    Lab Results  Component Value Date   NA 137 02/04/2013   K 4.1 02/04/2013   CREATININE 0.86 02/04/2013    Assessment: BP is 127/71 today. Denies any headaches or vision changes. Reports compliance with Lisinopril 20 mg daily and HCTZ 25 mg daily. Does not check his BP at home.   Plan: Continue current medications Check BMET today

## 2015-08-25 NOTE — Patient Instructions (Signed)
Antonio [name],   Thank you for coming in today.  Your blood pressure looks great today, great job. I am checking some routine blood work on you today, I will call if you there are any problems.  I would like to see you back in 6 months for follow up.

## 2015-08-25 NOTE — Progress Notes (Signed)
Internal Medicine Clinic Attending  Case discussed with Dr. Boswell at the time of the visit.  We reviewed the resident's history and exam and pertinent patient test results.  I agree with the assessment, diagnosis, and plan of care documented in the resident's note.  

## 2015-08-25 NOTE — Progress Notes (Signed)
   CC: Medication refills/HTN/Sciatic Pain  HPI:  [name] is a 60 y.o. male with a past medical history listed below here today for follow up of his HTN and sciatic pain.   For details of today's visit and the status of his chronic medical issues please refer to the assessment and plan.   Past Medical History  Diagnosis Date  . GERD (gastroesophageal reflux disease)   . COPD (chronic obstructive pulmonary disease) ([place])   . Hypertension   . Tobacco abuse   . Arthritis   . Colonic polyp   . Chronic cough   . 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 Allergy     allergic rhinitis  . Rectal bleeding     colonoscopy in 01/2006  . Dysphagia     normal EGD except for mild gastritis in 01/2006  . Rectal polyp     01/2006 with path showing it to be benign, though with a question that it was incomplete biopsy, next scheduled coloscopy 2010  . Varus deformity of knee     bilateral, congenital    Review of Systems:  Review of Systems  Eyes: Negative for blurred vision and double vision.  Respiratory: Negative for shortness of breath.   Musculoskeletal: Positive for joint pain. Negative for falls.  Neurological: Negative for dizziness and headaches.   Physical Exam:  Filed Vitals:   08/25/15 1416  BP: 127/71  Pulse: 89  Temp: 98.1 F (36.7 C)  TempSrc: Oral  Height: 5' 4.2" (1.631 m)  Weight: 175 lb 8 oz (79.606 kg)  SpO2: 100%   General: AA male, alert, cooperative, NAD. Lungs: Clear to ascultation bilaterally, normal work of respiration, no wheezes, rales, rhonchi Heart: RRR, no murmurs, gallops, or rubs Abdomen: Soft, non-tender, non-distended, BS + Extremities: No cyanosis, clubbing, or edema. Tenderness to palpation over left gluteal region. No limitations with ROM.  Neurologic: Alert & oriented X3, cranial nerves II-XII intact, strength grossly intact, sensation intact to light touch  Assessment & Plan:   See encounters tab for  problem based medical decision making.  Patient discussed with Dr. Angelia Mould

## 2015-08-25 NOTE — Assessment & Plan Note (Signed)
Patient reports pain is much improved now that he is back on the Neurontin. Still has occasional shooting pain (unable to describe quality) down his lateral left leg. No red flag symptoms. Reports using the Flexeril 2-3 times a week.   -Continue flexeril 10 mg qhs for muscle spasm -Continue gabapentin 300 mg tid  -BMET today

## 2015-08-26 LAB — BMP8+ANION GAP
ANION GAP: 17 mmol/L (ref 10.0–18.0)
BUN/Creatinine Ratio: 7 — ABNORMAL LOW (ref 9–20)
BUN: 7 mg/dL (ref 6–24)
CALCIUM: 9.5 mg/dL (ref 8.7–10.2)
CO2: 26 mmol/L (ref 18–29)
CREATININE: 1.02 mg/dL (ref 0.76–1.27)
Chloride: 98 mmol/L (ref 96–106)
GFR, EST AFRICAN AMERICAN: 93 mL/min/{1.73_m2} (ref >59)
GFR, EST NON AFRICAN AMERICAN: 80 mL/min/{1.73_m2} (ref >59)
Glucose: 95 mg/dL (ref 65–99)
Potassium: 3.9 mmol/L (ref 3.5–5.2)
Sodium: 141 mmol/L (ref 134–144)

## 2015-09-03 LAB — TOXASSURE SELECT,+ANTIDEPR,UR: PDF: 0

## 2015-09-13 ENCOUNTER — Other Ambulatory Visit: Payer: Self-pay | Admitting: Internal Medicine

## 2015-09-17 ENCOUNTER — Other Ambulatory Visit: Payer: Self-pay | Admitting: *Deleted

## 2015-09-17 DIAGNOSIS — E785 Hyperlipidemia, unspecified: Secondary | ICD-10-CM

## 2015-09-17 MED ORDER — PRAVASTATIN SODIUM 40 MG PO TABS: 40.0000 mg | ORAL_TABLET | Freq: Every evening | ORAL | 3 refills | Status: DC

## 2016-01-11 ENCOUNTER — Other Ambulatory Visit: Payer: Self-pay | Admitting: Internal Medicine

## 2016-02-08 ENCOUNTER — Other Ambulatory Visit: Payer: Self-pay | Admitting: Internal Medicine

## 2016-02-10 ENCOUNTER — Telehealth: Payer: Self-pay

## 2016-02-10 ENCOUNTER — Other Ambulatory Visit: Payer: Self-pay | Admitting: *Deleted

## 2016-02-10 NOTE — Telephone Encounter (Signed)
traMADol (ULTRAM) 50 MG tablet, refill request @ [facility] on [address]. Please needs to speak with a nurse.

## 2016-02-10 NOTE — Telephone Encounter (Signed)
Spoke w/ pt and sent request to pcp

## 2016-02-13 NOTE — Telephone Encounter (Signed)
Patient had inappropriate UDS at last visit. Needs an appointment.

## 2016-02-15 NOTE — Telephone Encounter (Signed)
Call from pt checking on the status of his tramadol refill-pt informed that refill has been denied at this time and he will need to see MD.  Pt states he already has an appt scheduled for 02/23/16 with pcp-would like to know if he can get enough to last to appt. Will send to pcp at pt's request, please advise.Regenia Skeeter, Darlene Cassady1/2/201810:23 AM

## 2016-02-15 NOTE — Telephone Encounter (Signed)
He needs to be seen first. I am in acute care clinic this month and he can be seen by me then if needed.

## 2016-02-16 NOTE — Telephone Encounter (Signed)
Per dr [name] called Antonio [name] and offered an appt for tomorrow in [facility], informing him that dr boswell will be unable to refill tramadol until he sees Antonio [name], Antonio [name] states he will wait until his appt next week even though he is in pain, again he is offered an appt and refused once again. Call ended

## 2016-02-22 NOTE — Progress Notes (Signed)
CC: OA  HPI:  Antonio [name] is a 61 y.o. male with a past medical history listed below here today for follow up of his leg pain.   HTN - BP today is 138/68. Currently on HCTZ 25 mg daily and Lisinopril 20 mg daily. Reports compliance. Denies any headaches, vision changes, chest pain, palpations, nausea/vomiting, dizziness/lightheadedness. Last BMET 08/2015 with no abnormalities.   Sciatic Leg Pain - Currently on gabapentin 300 mg tid and flexeril 10 mg qhs for muscle spasms. Well controlled on the current medications.  Osteoarthritis - Has been on Tramadol 50 mg q6hr prn I the past for chronic knee pain. Reports pain well controlled on regimen. At previous visit 08/2015, UDS was positive for cocaine. Review of [place] narcotic database shows he received Tramadol 50 mg #120 on 8/29 and again on 10/27. Requested refill last week and told that he would be need to be seen in clinic first. He declined appointment with me last week and said he would wait until today to be seen. Appears that he is going through 120 pills in 2 months, roughly 2 pill a day. Today he reports that he does take occasional cocaine. Says that he does not have the money for it but will use it if his friends have it an offer it. Only takes it when his pain is uncontrolled. Says that he takes 2 Tramadol a day, one in the am and one in the pm. Says that it helps with the pain. Reports never taking more than 2 pills in a day. Taking it scheduled instead of prn. States he ran out of his Rx at the end of December. Reports using cocaine a week ago.  HLD - Currently on Pravastin 40 mg daily. Only with 12% reduction in LDL noted after starting therapy in 11/2014.    Smoking - Reports smoking 2-3 cigarettes a day. Reports trying to quit but has not had any change in his smoking since last visit. Stopping on his own. No desire for help quitting. Spent 4 minutes discussing smoking cessation.  HCM - Has not had a flu shot this year. Will give  today.  Due for Colonoscopy in 2020.  Past Medical History:  Diagnosis Date  . Allergy    allergic rhinitis  . Arthritis   . Chronic cough   . Colonic polyp   . COPD (chronic obstructive pulmonary disease) ([place])   . 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 Dysphagia    normal EGD except for mild gastritis in 01/2006  . GERD (gastroesophageal reflux disease)   . Hypertension   . Rectal bleeding    colonoscopy in 01/2006  . Rectal polyp    01/2006 with path showing it to be benign, though with a question that it was incomplete biopsy, next scheduled coloscopy 2010  . Tobacco abuse   . Varus deformity of knee    bilateral, congenital    Review of Systems:   Negative except as noted in HPI  Physical Exam:  Vitals:   02/23/16 1322  BP: 138/68  Pulse: 89  Temp: 97.9 F (36.6 C)  TempSrc: Oral  SpO2: 100%  Weight: 177 lb 9.6 oz (80.6 kg)  Height: 5' 4.2" (1.631 m)   General: AA male, alert, cooperative, NAD. Lungs: Clear to ascultation bilaterally, normal work of respiration, no wheezes, rales, rhonchi Heart: RRR, no murmurs, gallops, or rubs Abdomen: Soft, non-tender, non-distended, BS + Extremities: No cyanosis, clubbing, or edema. No  limitations with ROM.   Assessment & Plan:   See Encounters Tab for problem based charting.  Patient discussed with Dr. [name]

## 2016-02-23 ENCOUNTER — Ambulatory Visit (INDEPENDENT_AMBULATORY_CARE_PROVIDER_SITE_OTHER): Payer: Medicare Other | Admitting: Internal Medicine

## 2016-02-23 ENCOUNTER — Encounter: Payer: Self-pay | Admitting: Internal Medicine

## 2016-02-23 VITALS — BP 138/68 | HR 89 | Temp 97.9°F | Ht 64.2 in | Wt 177.6 lb

## 2016-02-23 DIAGNOSIS — IMO0002 Reserved for concepts with insufficient information to code with codable children: Secondary | ICD-10-CM

## 2016-02-23 DIAGNOSIS — I10 Essential (primary) hypertension: Secondary | ICD-10-CM | POA: Diagnosis not present

## 2016-02-23 DIAGNOSIS — M5430 Sciatica, unspecified side: Secondary | ICD-10-CM | POA: Diagnosis not present

## 2016-02-23 DIAGNOSIS — M1710 Unilateral primary osteoarthritis, unspecified knee: Secondary | ICD-10-CM

## 2016-02-23 DIAGNOSIS — M170 Bilateral primary osteoarthritis of knee: Secondary | ICD-10-CM | POA: Diagnosis not present

## 2016-02-23 DIAGNOSIS — E785 Hyperlipidemia, unspecified: Secondary | ICD-10-CM | POA: Diagnosis not present

## 2016-02-23 DIAGNOSIS — F17200 Nicotine dependence, unspecified, uncomplicated: Secondary | ICD-10-CM

## 2016-02-23 DIAGNOSIS — F17210 Nicotine dependence, cigarettes, uncomplicated: Secondary | ICD-10-CM

## 2016-02-23 DIAGNOSIS — Z79899 Other long term (current) drug therapy: Secondary | ICD-10-CM

## 2016-02-23 DIAGNOSIS — Z79891 Long term (current) use of opiate analgesic: Secondary | ICD-10-CM

## 2016-02-23 DIAGNOSIS — Z0000 Encounter for general adult medical examination without abnormal findings: Secondary | ICD-10-CM

## 2016-02-23 DIAGNOSIS — M62838 Other muscle spasm: Secondary | ICD-10-CM

## 2016-02-23 MED ORDER — DICLOFENAC SODIUM 1 % TD GEL: 2.0000 g | Freq: Four times a day (QID) | TRANSDERMAL | Status: DC

## 2016-02-23 NOTE — Patient Instructions (Addendum)
Antonio [name],  I will be unable to provided you with the Tramadol prescription. I am going to give you a prescription for Voltaren gel today. You can apply it to your knees four times a day.   I would like to see you back in a month for follow up.

## 2016-02-24 LAB — LIPID PANEL
CHOL/HDL RATIO: 2.8 ratio (ref 0.0–5.0)
Cholesterol, Total: 181 mg/dL (ref 100–199)
HDL: 65 mg/dL (ref >39)
LDL Calculated: 94 mg/dL (ref 0–99)
Triglycerides: 111 mg/dL (ref 0–149)
VLDL CHOLESTEROL CAL: 22 mg/dL (ref 5–40)

## 2016-02-25 ENCOUNTER — Encounter: Payer: Self-pay | Admitting: Internal Medicine

## 2016-02-25 NOTE — Assessment & Plan Note (Signed)
Currently on Pravastin 40 mg daily. Only with 12% reduction in LDL noted after starting therapy in 11/2014.   A/P Will recheck today. Consider high intensity statin if no further improvement.  ASCVD 13.6%.  No further improvement in LDL on pravastatin, will increase to high intensity statin at next visit.

## 2016-02-25 NOTE — Assessment & Plan Note (Signed)
Reports smoking 2-3 cigarettes a day. Reports trying to quit but has not had any change in his smoking since last visit. Stopping on his own. No desire for help quitting. Spent 4 minutes discussing smoking cessation.

## 2016-02-25 NOTE — Assessment & Plan Note (Signed)
Has been on Tramadol 50 mg q6hr prn I the past for chronic knee pain. Reports pain well controlled on regimen. At previous visit 08/2015, UDS was positive for cocaine. Review of [place] narcotic database shows he received Tramadol 50 mg #120 on 8/29 and again on 10/27. Requested refill last week and told that he would be need to be seen in clinic first. He declined appointment with me last week and said he would wait until today to be seen. Appears that he is going through 120 pills in 2 months, roughly 2 pill a day. Today he reports that he does take occasional cocaine. Says that he does not have the money for it but will use it if his friends have it an offer it. Only takes it when his pain is uncontrolled. Says that he takes 2 Tramadol a day, one in the am and one in the pm. Says that it helps with the pain. Reports never taking more than 2 pills in a day. Taking it scheduled instead of prn. States he ran out of his Rx at the end of December. Reports using cocaine a week ago.  Plan UDS today Discussed that I did not feel that Tramadol was appropriate for his knee pain at this time - he became visibly upset and commented on it several times Will try voltaren gel prn

## 2016-02-25 NOTE — Assessment & Plan Note (Signed)
BP Readings from Last 3 Encounters:  02/23/16 138/68  08/25/15 127/71  05/13/15 137/66    Lab Results  Component Value Date   NA 141 08/25/2015   K 3.9 08/25/2015   CREATININE 1.02 08/25/2015    Assessment: BP today is 138/68. Currently on HCTZ 25 mg daily and Lisinopril 20 mg daily. Reports compliance. Denies any headaches, vision changes, chest pain, palpations, nausea/vomiting, dizziness/lightheadedness. Last BMET 08/2015 with no abnormalities.   Plan: Continue current medications

## 2016-02-25 NOTE — Assessment & Plan Note (Signed)
Has not had a flu shot this year. Will give today.  Due for Colonoscopy in 2020.

## 2016-02-25 NOTE — Assessment & Plan Note (Signed)
Currently on gabapentin 300 mg tid and flexeril 10 mg qhs for muscle spasms. Well controlled on the current medications.  Plan Continue current medications

## 2016-02-29 NOTE — Progress Notes (Signed)
Internal Medicine Clinic Attending  Case discussed with Dr. Boswell at the time of the visit.  We reviewed the resident's history and exam and pertinent patient test results.  I agree with the assessment, diagnosis, and plan of care documented in the resident's note.  

## 2016-03-01 LAB — TOXASSURE SELECT,+ANTIDEPR,UR

## 2016-03-21 ENCOUNTER — Encounter: Payer: Self-pay | Admitting: Internal Medicine

## 2016-03-21 ENCOUNTER — Ambulatory Visit (INDEPENDENT_AMBULATORY_CARE_PROVIDER_SITE_OTHER): Payer: Medicare Other | Admitting: Internal Medicine

## 2016-03-21 VITALS — BP 141/74 | HR 93 | Temp 98.2°F | Ht 64.2 in | Wt 178.4 lb

## 2016-03-21 DIAGNOSIS — E785 Hyperlipidemia, unspecified: Secondary | ICD-10-CM

## 2016-03-21 DIAGNOSIS — M21161 Varus deformity, not elsewhere classified, right knee: Secondary | ICD-10-CM

## 2016-03-21 DIAGNOSIS — M21162 Varus deformity, not elsewhere classified, left knee: Secondary | ICD-10-CM

## 2016-03-21 DIAGNOSIS — J309 Allergic rhinitis, unspecified: Secondary | ICD-10-CM

## 2016-03-21 DIAGNOSIS — IMO0002 Reserved for concepts with insufficient information to code with codable children: Secondary | ICD-10-CM

## 2016-03-21 DIAGNOSIS — F1411 Cocaine abuse, in remission: Secondary | ICD-10-CM

## 2016-03-21 DIAGNOSIS — M5430 Sciatica, unspecified side: Secondary | ICD-10-CM | POA: Diagnosis not present

## 2016-03-21 DIAGNOSIS — Z79899 Other long term (current) drug therapy: Secondary | ICD-10-CM

## 2016-03-21 DIAGNOSIS — Z79891 Long term (current) use of opiate analgesic: Secondary | ICD-10-CM | POA: Diagnosis not present

## 2016-03-21 DIAGNOSIS — I10 Essential (primary) hypertension: Secondary | ICD-10-CM

## 2016-03-21 DIAGNOSIS — M21169 Varus deformity, not elsewhere classified, unspecified knee: Secondary | ICD-10-CM

## 2016-03-21 DIAGNOSIS — M1710 Unilateral primary osteoarthritis, unspecified knee: Secondary | ICD-10-CM

## 2016-03-21 DIAGNOSIS — Z23 Encounter for immunization: Secondary | ICD-10-CM

## 2016-03-21 DIAGNOSIS — Z0000 Encounter for general adult medical examination without abnormal findings: Secondary | ICD-10-CM

## 2016-03-21 MED ORDER — DICLOFENAC SODIUM 1 % TD GEL: 2.0000 g | Freq: Four times a day (QID) | TRANSDERMAL | 2 refills | Status: DC

## 2016-03-21 MED ORDER — ATORVASTATIN CALCIUM 40 MG PO TABS: 40.0000 mg | ORAL_TABLET | Freq: Every day | ORAL | 2 refills | Status: DC

## 2016-03-21 MED ORDER — GABAPENTIN 300 MG PO CAPS: 300.0000 mg | ORAL_CAPSULE | Freq: Three times a day (TID) | ORAL | 3 refills | Status: DC

## 2016-03-21 NOTE — Progress Notes (Signed)
   CC: Knee pain  HPI:  Mr.Bearett Veronica is a 61 y.o. male with a past medical history listed below here today for follow up of his chronic pain.   For details of today's visit and the status of his chronic medical issues please refer to the assessment and plan.   Past Medical History:  Diagnosis Date  . Allergy    allergic rhinitis  . Arthritis   . Chronic cough   . Colonic polyp   . COPD (chronic obstructive pulmonary disease) ([place])   . 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 Dysphagia    normal EGD except for mild gastritis in 01/2006  . GERD (gastroesophageal reflux disease)   . Hypertension   . Rectal bleeding    colonoscopy in 01/2006  . Rectal polyp    01/2006 with path showing it to be benign, though with a question that it was incomplete biopsy, next scheduled coloscopy 2010  . Tobacco abuse   . Varus deformity of knee    bilateral, congenital    Review of Systems:   See HPI  Physical Exam:  Vitals:   03/21/16 1454  BP: (!) 141/74  Pulse: 93  Temp: 98.2 F (36.8 C)  TempSrc: Oral  SpO2: 99%  Weight: 178 lb 6.4 oz (80.9 kg)  Height: 5' 4.2" (1.631 m)   Physical Exam  Constitutional: He is well-developed, well-nourished, and in no distress. No distress.  HENT:  Head: Normocephalic and atraumatic.  Cardiovascular: Normal rate and regular rhythm.   Pulmonary/Chest: Effort normal and breath sounds normal.  Musculoskeletal:  Varus deformity in bilateral LE with braces in place     Assessment & Plan:   See Encounters Tab for problem based charting.  Patient discussed with Dr. [name]

## 2016-03-21 NOTE — Patient Instructions (Addendum)
Antonio [name],   I am sending in a prescription for the Voltaren gel again today. You can apply it to your knees four times a day as needed for pain. You can also take Tylenol as needed for pain but do not take more than 4000 mg (4 grams) in a day as more than this can damage your liver.   I would like to get you set up with the physical therapists to work with them and try to help your pain.  Your cholesterol is still elevated so I am going to stop your pravastatin and start you on a higher potency cholesterol medication called atorvastatin (lipitor) 40 mg daily.  I would like to see you back in 6-8 weeks for re-check.

## 2016-03-21 NOTE — Assessment & Plan Note (Signed)
Currently on pravastatin 40 mg daily. No improvement in LDL on moderate intensity statin.  Assessment: Uncontrolled HLD  Plan: Will d/c pravastatin and change to atorvastatin 40 mg daily Recheck lipid panel in 3 months

## 2016-03-22 NOTE — Assessment & Plan Note (Signed)
Refill gabapentin today

## 2016-03-22 NOTE — Assessment & Plan Note (Signed)
Flu shot today 

## 2016-03-22 NOTE — Assessment & Plan Note (Signed)
BP Readings from Last 3 Encounters:  03/21/16 (!) 141/74  02/23/16 138/68  08/25/15 127/71    Lab Results  Component Value Date   NA 141 08/25/2015   K 3.9 08/25/2015   CREATININE 1.02 08/25/2015   BP mildly elevated at 141/74 today. Currently on HCTZ 25 mg daily and Lisinopril 10 mg daily. Denies any symptoms today.   Assessment: Mildy worsened HTN  Plan: Continue current medications

## 2016-03-22 NOTE — Assessment & Plan Note (Signed)
Currently on loratadine and flonase. Symptoms well controlled. Request refill on loratadine.   Assessment: allergic rhinitis  Plan: Continue current medications

## 2016-03-22 NOTE — Assessment & Plan Note (Signed)
Reports he has continued pain in his bilateral knees with his R>L. Has not been taking anything currently for the pain. Did not fill prescription for Tramadol. Reports he has never tried any conservative measures. UDS at last visit was still positive for cocaine. Reports he has not used cocaine since our last visit.   Assessment: Chronic osteoarthritis  Plan:  UDS today Re-send Rx for Voltaren gel  Refer to PT  Tylenol prn

## 2016-03-22 NOTE — Assessment & Plan Note (Signed)
Referral to PT today

## 2016-03-27 LAB — TOXASSURE SELECT,+ANTIDEPR,UR

## 2016-03-29 NOTE — Progress Notes (Signed)
Internal Medicine Clinic Attending  Case discussed with Dr. Boswell at the time of the visit.  We reviewed the resident's history and exam and pertinent patient test results.  I agree with the assessment, diagnosis, and plan of care documented in the resident's note.  

## 2016-03-31 ENCOUNTER — Telehealth: Payer: Self-pay | Admitting: *Deleted

## 2016-03-31 NOTE — Telephone Encounter (Signed)
Prior Authorization information was sent to CoverMyMeds.  PA obtained 03/31/2016 thru 02/12/2017.  Suzie Portela Erling Conte was informed.  Sander Nephew, RN 03/31/2016 10:57 AM.

## 2016-04-05 ENCOUNTER — Encounter: Payer: Self-pay | Admitting: Physical Therapy

## 2016-04-05 ENCOUNTER — Ambulatory Visit: Payer: Medicare Other | Attending: Internal Medicine | Admitting: Physical Therapy

## 2016-04-05 DIAGNOSIS — G8929 Other chronic pain: Secondary | ICD-10-CM

## 2016-04-05 DIAGNOSIS — M25562 Pain in left knee: Secondary | ICD-10-CM | POA: Insufficient documentation

## 2016-04-05 DIAGNOSIS — R262 Difficulty in walking, not elsewhere classified: Secondary | ICD-10-CM | POA: Diagnosis not present

## 2016-04-05 DIAGNOSIS — M25561 Pain in right knee: Secondary | ICD-10-CM | POA: Diagnosis not present

## 2016-04-05 NOTE — Therapy (Signed)
[facility] [place], [place], [address] Phone: [phone]   Fax:  [phone]  Physical Therapy Evaluation  Patient Details  Name: Antonio [name] MRN: [id number] Date of Birth: Jan 04, 1956 Referring Provider: Maryellen Pile  Encounter Date: 04/05/2016      PT End of Session - 04/05/16 1343    Visit Number 1   Date for PT Re-Evaluation 06/03/16   PT Start Time 1314   PT Stop Time 1405   PT Time Calculation (min) 51 min   Activity Tolerance Patient tolerated treatment well   Behavior During Therapy [facility] for tasks assessed/performed      Past Medical History:  Diagnosis Date  . Allergy    allergic rhinitis  . Arthritis   . Chronic cough   . Colonic polyp   . COPD (chronic obstructive pulmonary disease) ([place])   . 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 Dysphagia    normal EGD except for mild gastritis in 01/2006  . GERD (gastroesophageal reflux disease)   . Hypertension   . Rectal bleeding    colonoscopy in 01/2006  . Rectal polyp    01/2006 with path showing it to be benign, though with a question that it was incomplete biopsy, next scheduled coloscopy 2010  . Tobacco abuse   . Varus deformity of knee    bilateral, congenital    History reviewed. No pertinent surgical history.  There were no vitals filed for this visit.       Subjective Assessment - 04/05/16 1318    Subjective Patient reports that he has had bilateral knee pain due to significant varus deformities of the knees over many years.  He reports that he was forced to retire in 2004 and was put on disability.  He reports pain and difficulty walking over the last few years is getting worse.  He has braces for both knees that he reports helps but feels like they are about 60 years old.   Limitations Standing;Walking   Patient Stated Goals have less pain and be able to walk better   Currently in Pain? Yes   Pain Score 5    Pain Location Knee   Pain Orientation Right;Left   Pain Descriptors / Indicators Sharp   Pain Type Chronic pain   Pain Onset More than a month ago   Pain Frequency Constant   Aggravating Factors  worse with walking and standing pain can be up to 8-9/10   Pain Relieving Factors get off the feet the pain can be 0/10   Effect of Pain on Daily Activities hard to walk and stand            [facility] PT Assessment - 04/05/16 0001      Assessment   Medical Diagnosis OA bilateral knees   Referring Provider Maryellen Pile   Onset Date/Surgical Date 01/04/16   Prior Therapy no     Precautions   Precautions None     Balance Screen   Has the patient fallen in the past 6 months No   Has the patient had a decrease in activity level because of a fear of falling?  No   Is the patient reluctant to leave their home because of a fear of falling?  No     Home Environment   Additional Comments single level, reports he does some yardwork     Prior Function   Level of Independence Independent with  community mobility with device   Vocation On disability   Leisure no exercies     Posture/Postural Control   Posture Comments very significant varus of the knees, when he has the brace on the knees it really controls the varus but it still bows, he reports that he could not put on the left brace due to pain in the left hip and buttock, the braces are about 60 years old     ROM / Strength   AROM / PROM / Strength AROM;Strength     AROM   Overall AROM Comments AROM of the knees 0-95 degrees flexion with some mild pain     Strength   Overall Strength Comments 4+/5 with some mild pain     Palpation   Palpation comment he is non tender, there is crepitus palpable with AROM     Ambulation/Gait   Gait Comments gait is with a stick in the right hand, has brace on the right knee today, slow with significant varus deformity of the knees     Standardized Balance Assessment   Standardized  Balance Assessment Timed Up and Go Test     Timed Up and Go Test   Normal TUG (seconds) 16                   [facility] Adult PT Treatment/Exercise - 04/05/16 0001      Modalities   Modalities Electrical Stimulation;Moist Heat     Moist Heat Therapy   Number Minutes Moist Heat 15 Minutes   Moist Heat Location Hip     Electrical Stimulation   Electrical Stimulation Location left buttock and hip area   Electrical Stimulation Action IFC   Electrical Stimulation Parameters sitting   Electrical Stimulation Goals Pain                PT Education - 04/05/16 1343    Education provided Yes   Education Details piriformis stretch, VMO QS and SAQ   Person(s) Educated Patient   Methods Explanation;Demonstration;Verbal cues;Handout   Comprehension Verbalized understanding;Returned demonstration          PT Short Term Goals - 04/05/16 1349      PT SHORT TERM GOAL #1   Title make referral for orthotist   Time 2   Period Weeks   Status Achieved           PT Long Term Goals - 04/05/16 1349      PT LONG TERM GOAL #1   Title report pain in the hip and leg decreased 25%   Time 6   Period Weeks   Status New               Plan - 04/05/16 1344    Clinical Impression Statement Patient presents with bilateral knee pain, he has some left hip, back and buttock pain coming from the back reports a pinched nerve.  The knees have a very severe varus deformity with any weightbearing, he has braces that help but his current braces are about 60 years old.  He does not have access to gym equipment.     Rehab Potential Fair   PT Frequency 1x / week   PT Duration 6 weeks   PT Treatment/Interventions ADLs/Self Care Home Management;Cryotherapy;Electrical Stimulation;Iontophoresis '4mg'$ /ml Dexamethasone;Moist Heat;Ultrasound;Therapeutic activities;Therapeutic exercise;Balance training;Patient/family education;Manual techniques   PT Next Visit Plan I feel that the best option  is for him to get new braces to help with varus prevention since his current braces are 10+  years old.  I feel that we coulde help the pain from the pinched nerve as well asn help with some general fitness   Consulted and Agree with Plan of Care Patient      Patient will benefit from skilled therapeutic intervention in order to improve the following deficits and impairments:  Abnormal gait, Decreased range of motion, Difficulty walking, Impaired flexibility, Postural dysfunction, Pain  Visit Diagnosis: Chronic pain of left knee - Plan: PT plan of care cert/re-cert  Chronic pain of right knee - Plan: PT plan of care cert/re-cert  Difficulty in walking, not elsewhere classified - Plan: PT plan of care cert/re-cert      G-Codes - 22/41/14 1350    Functional Assessment Tool Used (Outpatient Only) PT discretion   Functional Limitation Mobility: Walking and moving around   Mobility: Walking and Moving Around Current Status (Y4314) At least 40 percent but less than 60 percent impaired, limited or restricted   Mobility: Walking and Moving Around Goal Status (C7670) At least 40 percent but less than 60 percent impaired, limited or restricted       Problem List Patient Active Problem List   Diagnosis Date Noted  . Sciatic leg pain 02/24/2015  . Preventative health care 12/01/2014  . Hyperlipidemia 02/20/2013  . Varus deformity of knee 06/16/2011  . TOBACCO ABUSE 05/07/2006  . COLONIC POLYPS 03/14/2006  . Allergic rhinitis 01/19/2006  . COPD 01/19/2006  . Essential hypertension 01/11/2006  . Osteoarthrosis involving lower leg 01/11/2006    Sumner Boast., PT 04/05/2016, 1:53 PM  [place] [address] [place], [place], [address] Phone: [phone]   Fax:  [phone]  Name: Antonio [name] MRN: [id number] Date of Birth: 12-17-1955

## 2016-04-06 ENCOUNTER — Telehealth: Payer: Self-pay | Admitting: Physical Therapy

## 2016-04-06 NOTE — Telephone Encounter (Signed)
Left message to call to schedule PT eval on 2/7 & 03/27/16. No return call

## 2016-05-15 ENCOUNTER — Other Ambulatory Visit: Payer: Self-pay | Admitting: Internal Medicine

## 2016-05-17 ENCOUNTER — Observation Stay (HOSPITAL_COMMUNITY): Payer: Medicare Other

## 2016-05-17 ENCOUNTER — Encounter (HOSPITAL_COMMUNITY): Payer: Self-pay | Admitting: General Practice

## 2016-05-17 ENCOUNTER — Encounter: Payer: Self-pay | Admitting: Internal Medicine

## 2016-05-17 ENCOUNTER — Ambulatory Visit (INDEPENDENT_AMBULATORY_CARE_PROVIDER_SITE_OTHER): Payer: Medicare Other | Admitting: Internal Medicine

## 2016-05-17 ENCOUNTER — Inpatient Hospital Stay (HOSPITAL_COMMUNITY)
Admission: AD | Admit: 2016-05-17 | Discharge: 2016-05-19 | DRG: 147 | Disposition: A | Discharge status: Home / Self Care | Payer: Medicare Other | Source: Ambulatory Visit | Attending: Student in an Organized Health Care Education/Training Program | Admitting: Student in an Organized Health Care Education/Training Program

## 2016-05-17 ENCOUNTER — Encounter (INDEPENDENT_AMBULATORY_CARE_PROVIDER_SITE_OTHER): Payer: Self-pay

## 2016-05-17 DIAGNOSIS — Q741 Congenital malformation of knee: Secondary | ICD-10-CM

## 2016-05-17 DIAGNOSIS — I10 Essential (primary) hypertension: Secondary | ICD-10-CM | POA: Diagnosis not present

## 2016-05-17 DIAGNOSIS — R1310 Dysphagia, unspecified: Secondary | ICD-10-CM | POA: Diagnosis not present

## 2016-05-17 DIAGNOSIS — M79606 Pain in leg, unspecified: Secondary | ICD-10-CM | POA: Diagnosis not present

## 2016-05-17 DIAGNOSIS — F17200 Nicotine dependence, unspecified, uncomplicated: Secondary | ICD-10-CM | POA: Diagnosis present

## 2016-05-17 DIAGNOSIS — C770 Secondary and unspecified malignant neoplasm of lymph nodes of head, face and neck: Secondary | ICD-10-CM | POA: Diagnosis present

## 2016-05-17 DIAGNOSIS — G8929 Other chronic pain: Secondary | ICD-10-CM | POA: Diagnosis present

## 2016-05-17 DIAGNOSIS — D573 Sickle-cell trait: Secondary | ICD-10-CM | POA: Diagnosis present

## 2016-05-17 DIAGNOSIS — F17210 Nicotine dependence, cigarettes, uncomplicated: Secondary | ICD-10-CM | POA: Diagnosis not present

## 2016-05-17 DIAGNOSIS — C329 Malignant neoplasm of larynx, unspecified: Secondary | ICD-10-CM

## 2016-05-17 DIAGNOSIS — Z8249 Family history of ischemic heart disease and other diseases of the circulatory system: Secondary | ICD-10-CM

## 2016-05-17 DIAGNOSIS — E785 Hyperlipidemia, unspecified: Secondary | ICD-10-CM | POA: Diagnosis present

## 2016-05-17 DIAGNOSIS — J449 Chronic obstructive pulmonary disease, unspecified: Secondary | ICD-10-CM | POA: Diagnosis present

## 2016-05-17 DIAGNOSIS — E7800 Pure hypercholesterolemia, unspecified: Secondary | ICD-10-CM | POA: Diagnosis not present

## 2016-05-17 DIAGNOSIS — R000 Tachycardia, unspecified: Secondary | ICD-10-CM | POA: Diagnosis not present

## 2016-05-17 DIAGNOSIS — C321 Malignant neoplasm of supraglottis: Secondary | ICD-10-CM | POA: Diagnosis not present

## 2016-05-17 DIAGNOSIS — F1010 Alcohol abuse, uncomplicated: Secondary | ICD-10-CM | POA: Diagnosis not present

## 2016-05-17 DIAGNOSIS — R220 Localized swelling, mass and lump, head: Secondary | ICD-10-CM

## 2016-05-17 DIAGNOSIS — R221 Localized swelling, mass and lump, neck: Secondary | ICD-10-CM | POA: Diagnosis present

## 2016-05-17 DIAGNOSIS — R591 Generalized enlarged lymph nodes: Secondary | ICD-10-CM | POA: Diagnosis not present

## 2016-05-17 HISTORY — DX: Malignant neoplasm of head, face and neck: C76.0

## 2016-05-17 HISTORY — DX: Sickle-cell trait: D57.3

## 2016-05-17 HISTORY — DX: Pure hypercholesterolemia, unspecified: E78.00

## 2016-05-17 HISTORY — DX: Chronic sinusitis, unspecified: J32.9

## 2016-05-17 LAB — COMPREHENSIVE METABOLIC PANEL
ALBUMIN: 4.4 g/dL (ref 3.5–5.0)
ALT: 16 U/L — AB (ref 17–63)
AST: 26 U/L (ref 15–41)
Alkaline Phosphatase: 72 U/L (ref 38–126)
Anion gap: 10 (ref 5–15)
BUN: 11 mg/dL (ref 6–20)
CHLORIDE: 96 mmol/L — AB (ref 101–111)
CO2: 29 mmol/L (ref 22–32)
CREATININE: 1.06 mg/dL (ref 0.61–1.24)
Calcium: 9.6 mg/dL (ref 8.9–10.3)
GFR calc Af Amer: >60 mL/min (ref >60)
GFR calc non Af Amer: >60 mL/min (ref >60)
GLUCOSE: 94 mg/dL (ref 65–99)
POTASSIUM: 3.7 mmol/L (ref 3.5–5.1)
Sodium: 135 mmol/L (ref 135–145)
Total Bilirubin: 1 mg/dL (ref 0.3–1.2)
Total Protein: 7.5 g/dL (ref 6.5–8.1)

## 2016-05-17 LAB — CBC
HEMATOCRIT: 41.2 % (ref 39.0–52.0)
Hemoglobin: 14.2 g/dL (ref 13.0–17.0)
MCH: 28.3 pg (ref 26.0–34.0)
MCHC: 34.5 g/dL (ref 30.0–36.0)
MCV: 82.2 fL (ref 78.0–100.0)
PLATELETS: 378 10*3/uL (ref 150–400)
RBC: 5.01 MIL/uL (ref 4.22–5.81)
RDW: 14.5 % (ref 11.5–15.5)
WBC: 7.7 10*3/uL (ref 4.0–10.5)

## 2016-05-17 MED ORDER — SODIUM CHLORIDE 0.9% FLUSH
3.0000 mL | Freq: Two times a day (BID) | INTRAVENOUS | Status: DC
  Administered 2016-05-17 – 2016-05-19 (×3): 3 mL via INTRAVENOUS

## 2016-05-17 MED ORDER — OXYMETAZOLINE HCL 0.05 % NA SOLN
1.0000 | Freq: Once | NASAL | Status: AC | PRN
  Administered 2016-05-19: 1 via NASAL
  Filled 2016-05-17: qty 15

## 2016-05-17 MED ORDER — LORATADINE 10 MG PO TABS
10.0000 mg | ORAL_TABLET | Freq: Every day | ORAL | Status: DC
Start: 2016-05-18 — End: 2016-05-19
  Administered 2016-05-18 – 2016-05-19 (×2): 10 mg via ORAL
  Filled 2016-05-17 (×2): qty 1

## 2016-05-17 MED ORDER — CLINDAMYCIN PHOSPHATE 600 MG/50ML IV SOLN
600.0000 mg | Freq: Three times a day (TID) | INTRAVENOUS | Status: DC
  Administered 2016-05-17: 600 mg via INTRAVENOUS
  Filled 2016-05-17 (×3): qty 50

## 2016-05-17 MED ORDER — ATORVASTATIN CALCIUM 40 MG PO TABS
40.0000 mg | ORAL_TABLET | Freq: Every day | ORAL | Status: DC
  Administered 2016-05-18 – 2016-05-19 (×2): 40 mg via ORAL
  Filled 2016-05-17 (×2): qty 1

## 2016-05-17 MED ORDER — ACETAMINOPHEN 325 MG PO TABS
650.0000 mg | ORAL_TABLET | Freq: Four times a day (QID) | ORAL | Status: DC | PRN
  Administered 2016-05-17: 650 mg via ORAL
  Filled 2016-05-17: qty 2

## 2016-05-17 MED ORDER — LISINOPRIL 20 MG PO TABS
20.0000 mg | ORAL_TABLET | Freq: Every day | ORAL | Status: DC
  Administered 2016-05-17 – 2016-05-19 (×3): 20 mg via ORAL
  Filled 2016-05-17 (×3): qty 1

## 2016-05-17 MED ORDER — SENNOSIDES-DOCUSATE SODIUM 8.6-50 MG PO TABS: 1.0000 | ORAL_TABLET | Freq: Every evening | ORAL | Status: DC | PRN

## 2016-05-17 MED ORDER — SILVER NITRATE-POT NITRATE 75-25 % EX MISC
1.0000 | Freq: Once | CUTANEOUS | Status: DC | PRN
  Filled 2016-05-17 (×2): qty 1

## 2016-05-17 MED ORDER — ACETAMINOPHEN 650 MG RE SUPP: 650.0000 mg | Freq: Four times a day (QID) | RECTAL | Status: DC | PRN

## 2016-05-17 MED ORDER — TRIPLE ANTIBIOTIC 3.5-400-5000 EX OINT
1.0000 "application " | TOPICAL_OINTMENT | Freq: Once | CUTANEOUS | Status: DC | PRN
  Filled 2016-05-17: qty 1

## 2016-05-17 MED ORDER — LIDOCAINE-EPINEPHRINE (PF) 1 %-1:200000 IJ SOLN
0.0000 mL | Freq: Once | INTRAMUSCULAR | Status: DC | PRN
  Filled 2016-05-17: qty 30

## 2016-05-17 MED ORDER — LIDOCAINE HCL 4 % EX SOLN
0.0000 mL | Freq: Once | CUTANEOUS | Status: DC | PRN
  Filled 2016-05-17: qty 50

## 2016-05-17 MED ORDER — LIDOCAINE HCL 2 % EX GEL
1.0000 "application " | Freq: Once | CUTANEOUS | Status: DC | PRN
  Filled 2016-05-17: qty 5

## 2016-05-17 MED ORDER — HYDROCHLOROTHIAZIDE 25 MG PO TABS
25.0000 mg | ORAL_TABLET | Freq: Every day | ORAL | Status: DC
  Administered 2016-05-18 – 2016-05-19 (×2): 25 mg via ORAL
  Filled 2016-05-17 (×2): qty 1

## 2016-05-17 MED ORDER — ENOXAPARIN SODIUM 40 MG/0.4ML ~~LOC~~ SOLN
40.0000 mg | SUBCUTANEOUS | Status: DC
  Administered 2016-05-17 – 2016-05-18 (×2): 40 mg via SUBCUTANEOUS
  Filled 2016-05-17 (×2): qty 0.4

## 2016-05-17 MED ORDER — ONDANSETRON HCL 4 MG PO TABS: 4.0000 mg | ORAL_TABLET | Freq: Four times a day (QID) | ORAL | Status: DC | PRN

## 2016-05-17 MED ORDER — ONDANSETRON HCL 4 MG/2ML IJ SOLN: 4.0000 mg | Freq: Four times a day (QID) | INTRAMUSCULAR | Status: DC | PRN

## 2016-05-17 MED ORDER — GABAPENTIN 300 MG PO CAPS
300.0000 mg | ORAL_CAPSULE | Freq: Three times a day (TID) | ORAL | Status: DC
  Administered 2016-05-17 – 2016-05-19 (×5): 300 mg via ORAL
  Filled 2016-05-17 (×6): qty 1

## 2016-05-17 MED ORDER — SODIUM CHLORIDE 0.9 % IV SOLN
INTRAVENOUS | Status: AC
  Administered 2016-05-17: 22:00:00 via INTRAVENOUS

## 2016-05-17 MED ORDER — IOPAMIDOL (ISOVUE-300) INJECTION 61%
INTRAVENOUS | Status: AC
  Administered 2016-05-17: 75 mL
  Filled 2016-05-17: qty 75

## 2016-05-17 NOTE — Consult Note (Signed)
Wilber, Fini 61 y.o., male [id number]     Chief Complaint: LEFT neck swelling  HPI: 61 yo bm,  Gradually progressive LEFT neck "tingling", swelling, and pain.  Sl pain with swallowing.  No breathing issues.  No voice change.  No hx cancer.  WBC 7.7K.  Smokes 1-2 cigs/day.  1 case beer/week.  CT neck shows a probable primary lesion LEFT BOT/lateral pharyngeal wall/supraglottic larynx with multiple matted LEFT neck nodes and necrosis.    PMH: Past Medical History:  Diagnosis Date  . Allergy    allergic rhinitis  . Arthritis    "qwhere" (05/17/2016)  . Chronic cough   . Chronic sinus infection   . Colonic polyp   . COPD (chronic obstructive pulmonary disease) ([place])   . 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 Dysphagia    normal EGD except for mild gastritis in 01/2006  . GERD (gastroesophageal reflux disease)   . High cholesterol   . Hypertension   . Rectal bleeding    colonoscopy in 01/2006  . Rectal polyp    01/2006 with path showing it to be benign, though with a question that it was incomplete biopsy, next scheduled coloscopy 2010  . Sickle cell trait ([place])   . Tobacco abuse   . Varus deformity of knee    bilateral, congenital    Surg Hx: Past Surgical History:  Procedure Laterality Date  . COLONOSCOPY W/ BIOPSIES AND POLYPECTOMY  X 2  . MULTIPLE TOOTH EXTRACTIONS  ~ 2008   "took them all out"    FHx:   Family History  Problem Relation Age of Onset  . Cancer Mother   . Hypertension Father   . Diabetes Father    SocHx:  reports that he has been smoking Cigarettes.  He has a 5.16 pack-year smoking history. He has never used smokeless tobacco. He reports that he drinks about 14.4 oz of alcohol per week . He reports that he uses drugs, including "Crack" cocaine and Marijuana.  ALLERGIES: No Known Allergies  Medications Prior to Admission  Medication Sig Dispense Refill  . atorvastatin (LIPITOR) 40 MG tablet Take 1 tablet (40 mg total) by  mouth daily. 30 tablet 2  . cyclobenzaprine (FLEXERIL) 10 MG tablet Take 1 tablet (10 mg total) by mouth at bedtime. 30 tablet 1  . gabapentin (NEURONTIN) 300 MG capsule Take 1 capsule (300 mg total) by mouth 3 (three) times daily. 90 capsule 3  . hydrochlorothiazide (HYDRODIURIL) 25 MG tablet TAKE ONE TABLET BY MOUTH ONCE DAILY 90 tablet 3  . lisinopril (PRINIVIL,ZESTRIL) 20 MG tablet TAKE ONE TABLET BY MOUTH ONCE DAILY 90 tablet 2  . loratadine (CLARITIN) 10 MG tablet TAKE ONE TABLET BY MOUTH ONCE DAILY 90 tablet 3  . diclofenac sodium (VOLTAREN) 1 % GEL Apply 2 g topically 4 (four) times daily. (Patient not taking: Reported on 05/17/2016) 1 Tube 2    Results for orders placed or performed during the hospital encounter of 05/17/16 (from the past 48 hour(s))  Comprehensive metabolic panel     Status: Abnormal   Collection Time: 05/17/16  4:55 PM  Result Value Ref Range   Sodium 135 135 - 145 mmol/L   Potassium 3.7 3.5 - 5.1 mmol/L   Chloride 96 (L) 101 - 111 mmol/L   CO2 29 22 - 32 mmol/L   Glucose, Bld 94 65 - 99 mg/dL   BUN 11 6 - 20 mg/dL   Creatinine, Ser 1.06 0.61 -  1.24 mg/dL   Calcium 9.6 8.9 - 10.3 mg/dL   Total Protein 7.5 6.5 - 8.1 g/dL   Albumin 4.4 3.5 - 5.0 g/dL   AST 26 15 - 41 U/L   ALT 16 (L) 17 - 63 U/L   Alkaline Phosphatase 72 38 - 126 U/L   Total Bilirubin 1.0 0.3 - 1.2 mg/dL   GFR calc non Af Amer >60 >60 mL/min   GFR calc Af Amer >60 >60 mL/min    Comment: (NOTE) The eGFR has been calculated using the CKD EPI equation. This calculation has not been validated in all clinical situations. eGFR's persistently <60 mL/min signify possible Chronic Kidney Disease.    Anion gap 10 5 - 15  CBC     Status: None   Collection Time: 05/17/16  4:55 PM  Result Value Ref Range   WBC 7.7 4.0 - 10.5 K/uL   RBC 5.01 4.22 - 5.81 MIL/uL   Hemoglobin 14.2 13.0 - 17.0 g/dL   HCT 41.2 39.0 - 52.0 %   MCV 82.2 78.0 - 100.0 fL   MCH 28.3 26.0 - 34.0 pg   MCHC 34.5 30.0 - 36.0  g/dL   RDW 14.5 11.5 - 15.5 %   Platelets 378 150 - 400 K/uL   Ct Soft Tissue Neck W Contrast  Addendum Date: 05/17/2016   ADDENDUM REPORT: 05/17/2016 20:56 ADDENDUM: Acute findings discussed with and reconfirmed by Dr.Ryla Cauthon, ENT on 03/22/621 at 8:54 pm. Acute findings discussed with and reconfirmed by Dr.Shorr on 05/17/2016 at 8:56 pm. Electronically Signed   By: Elon Alas M.D.   On: 05/17/2016 20:56   Result Date: 05/17/2016 CLINICAL DATA:  Facial tingling and LEFT facial swelling since March 13th. Worsening pain. History of chronic sinus infection, tobacco abuse. EXAM: CT NECK WITH CONTRAST TECHNIQUE: Multidetector CT imaging of the neck was performed using the standard protocol following the bolus administration of intravenous contrast. CONTRAST:  75 cc ISOVUE-300 IOPAMIDOL (ISOVUE-300) INJECTION 61% COMPARISON:  CT HEAD February 08, 2003 FINDINGS: PHARYNX AND LARYNX: LEFT hypopharyngeal/ piriform sinus mucosal to submucosal 19 x 19 mm mass invading the LEFT supraglottic fat, LEFT glossopharyngeal fold, LEFT aryepiglottic fold. Moderately narrowed supraglottic airway. No definite strap muscle or thyroid cartilage invasion. Moderately narrowed hypopharynx. SALIVARY GLANDS: Normal. THYROID: Normal. LYMPH NODES: LEFT level IIa 2 level 3 4.7 x 4.6 x 7.6 cm (transverse by AP view 8 cc) nodal conglomeration with central necrosis. Additional small necrotic satellite lymph nodes. Hazy density along the inferior margin of the nodal conglomeration concerning for subcapsular spread of disease. 13 mm round LEFT level level 4 lymph node. VASCULAR: Mild calcific atherosclerosis. Moderate calcific atherosclerosis of the carotid bifurcations. LIMITED INTRACRANIAL: Normal. VISUALIZED ORBITS: Normal. MASTOIDS AND VISUALIZED PARANASAL SINUSES: Well-aerated. SKELETON: Nonacute.  Patient is edentulous. UPPER CHEST: Lung apices are clear. No superior mediastinal lymphadenopathy. OTHER: None. 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 LEFT neck lymphadenopathy including LEFT level IIa to level 3 4.7 x 4.6 x 7.6 cm necrotic nodal conglomeration with suspected extracapsular spread of disease. Moderately narrowed hypopharynx. Electronically Signed: By: Elon Alas M.D. On: 05/17/2016 20:43    ROS:  No SOB.  No weight loss.  Blood pressure (!) 158/75, pulse 97, temperature 99.1 F (37.3 C), temperature source Oral, resp. rate 18, height _0  (1.6 m), weight 78.5 kg (173 lb), SpO2 95 %.  PHYSICAL EXAM: Overall appearance:  Healthy and energetic. Head:  NCAT Ears:  clear Nose:clear Oral Cavity:  Edentulous.   Oral Pharynx/Hypopharynx/Larynx:  OP clear. Neuro:  Grossly intact Neck:  Large firm tender LEFT level II,III,IV mass.  With informed consent, using 5 ml 2% visous xylocaine in both sides of the nose, the flexible laryngoscope was introduced.  NP clear.  OP fleshy with prominent lingual tonsils.  HP/Larynx with thickening of the epiglottis and aryepiglottic fold on the LEFT.  No gross tumor erosion.  Vocal cords mobile.  Airway mildly compromised.    On palpation, firm mass either tonsil/lateral pharyngeal wall/ BOT.    Studies Reviewed:  CT neck with contrast.    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  Plan:  For Ultrasound guided FNA tomorrow for cytology and hopefully P16 testing.  When we have a positive biopsy, then will order a PET scan and get him set up with Medical Oncology and Radiation Oncology.  I will order a Panorex while he is here.  Discussed with pt and family.    [name] 09/16/4619, 9:04 PM

## 2016-05-17 NOTE — Assessment & Plan Note (Signed)
[name] reports that 3-4 weeks ago he started having left ear pain and tingling. Shortly thereafter he developed swelling and pain along his left jaw. The swelling caused him to have issues with swallowing. Reports pain with swallowing both liquids and solids. Report difficulty swallowing solids only reporting sensation of food getting stuck in his throat. Denies any fevers or chills. Reports pain with opening and closing his jaw. Denies any shortness of breath or difficulty breathing.   On exam, his left jaw is warm to touch with hard nodule underneath the left jaw line. Tenderness to palpation. No auricular pain. No fluctuance appreciated. Left ear normal with the exception of some external ear canal erythema.   Assessment: Left sided facial edema concerning for infection  Plan: Concern for parotitis vs cellulitis vs abscess. Unclear source of infection but possible tracking from ear canal though ears normal except for some external canal erythema. Discussed with inpatient team who will admit for observation. Will need facial CT and will start empiric IV clindamycin to cover floral anaerobes.

## 2016-05-17 NOTE — Progress Notes (Signed)
   CC: Facial swelling  HPI:  [name] is a 61 y.o. male with a past medical history listed below here today with complaints of facial swelling.   For details of today's visit and the status of his chronic medical issues please refer to the assessment and plan.  Past Medical History:  Diagnosis Date  . Allergy    allergic rhinitis  . Arthritis   . Chronic cough   . Colonic polyp   . COPD (chronic obstructive pulmonary disease) ([place])   . 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 Dysphagia    normal EGD except for mild gastritis in 01/2006  . GERD (gastroesophageal reflux disease)   . Hypertension   . Rectal bleeding    colonoscopy in 01/2006  . Rectal polyp    01/2006 with path showing it to be benign, though with a question that it was incomplete biopsy, next scheduled coloscopy 2010  . Tobacco abuse   . Varus deformity of knee    bilateral, congenital    Review of Systems:   See HPI  Physical Exam:  Vitals:   05/17/16 1404  BP: (!) 146/69  Pulse: (!) 102  Temp: 98.5 F (36.9 C)  TempSrc: Oral  SpO2: 95%  Weight: 173 lb (78.5 kg)  Height: 5' 3.5" (1.613 m)   Physical Exam  Constitutional: He is well-developed, well-nourished, and in no distress. No distress.  HENT:  Right Ear: Tympanic membrane, external ear and ear canal normal.  Left Ear: Tympanic membrane and ear canal normal.  Left external ear canal with erythema. Significant left facial edema along jaw line. Large immobile firm nodule underneath left jaw. Tender to palpation. Warm to palpation.   Cardiovascular: Regular rhythm and normal heart sounds.  Tachycardia present.   Pulmonary/Chest: Effort normal and breath sounds normal. No respiratory distress. He has no wheezes.  Vitals reviewed.   Assessment & Plan:   See Encounters Tab for problem based charting.  Patient seen with Dr. [name]

## 2016-05-17 NOTE — Progress Notes (Signed)
Received patient as direct admit to room 6N02.  Called physician for orders.

## 2016-05-17 NOTE — H&P (Signed)
Date: 05/17/2016               Patient Name:  Antonio [name] MRN: [id number]  DOB: 1955-10-21 Age / Sex: 61 y.o., male   PCP: Maryellen Pile, MD         Medical Service: Internal Medicine Teaching Service         Attending Physician: Dr. Lalla Brothers    First Contact: Dr. Asencion Partridge Pager: [phone]  Second Contact: Dr. Burgess Estelle Pager: [phone]       After Hours (After 5p/  First Contact Pager: [phone]  weekends / holidays): Second Contact Pager: [phone]   Chief Complaint: Face swelling  History of Present Illness: Antonio [name] is a 61 y.o. gentleman with PMH chronic sinus infections, COPD, osteoarthritis, HTN, GERD, tobacco and alcohol abuse who presents for left sided face and neck swelling. He reports he awoke about 3-4 weeks ago and noticed pain and tingling around his left ear that was mild. Since then he has developed progressive painful swelling below his left ear that is now extending below his left jaw line and down his neck. He feels like the swelling "is draining down" his neck, although he denies noticing any visible drainage. He reports odynophagia and occasional dysphagia of solid foods, and feels like food gets stuck in his throat. He reports a mild productive cough after eating and drinking. He has pain with jaw and neck movement. He had one episodes of pain around his left eye that "felt like a migraine" a few days ago that resolved. Otherwise he denies vision changes, eye pain, otalgia, hearing loss, dental problems (edentulous since '08), trauma, fevers, chills, dyspnea, or headache. He reports he has lost a few pounds due to difficulty with po intake.  He was directly admitted from the IM clinic for emergent evaluation.   Meds:  Current Meds  Medication Sig  . atorvastatin (LIPITOR) 40 MG tablet Take 1 tablet (40 mg total) by mouth daily.  . cyclobenzaprine (FLEXERIL) 10 MG tablet Take 1 tablet (10 mg total) by mouth at bedtime.  . gabapentin  (NEURONTIN) 300 MG capsule Take 1 capsule (300 mg total) by mouth 3 (three) times daily.  . hydrochlorothiazide (HYDRODIURIL) 25 MG tablet TAKE ONE TABLET BY MOUTH ONCE DAILY  . lisinopril (PRINIVIL,ZESTRIL) 20 MG tablet TAKE ONE TABLET BY MOUTH ONCE DAILY  . loratadine (CLARITIN) 10 MG tablet TAKE ONE TABLET BY MOUTH ONCE DAILY   Allergies: Allergies as of 05/17/2016  . (No Known Allergies)   Past Medical History:  Diagnosis Date  . Allergy    allergic rhinitis  . Arthritis   . Chronic cough   . Colonic polyp   . COPD (chronic obstructive pulmonary disease) ([place])   . 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 Dysphagia    normal EGD except for mild gastritis in 01/2006  . GERD (gastroesophageal reflux disease)   . Hypertension   . Rectal bleeding    colonoscopy in 01/2006  . Rectal polyp    01/2006 with path showing it to be benign, though with a question that it was incomplete biopsy, next scheduled coloscopy 2010  . Tobacco abuse   . Varus deformity of knee    bilateral, congenital    Family History:  Family History  Problem Relation Age of Onset  . Cancer Mother   . Hypertension Father   . Diabetes Father   . Diabetes Sister   . CAD Sister  Social History:  Social History   Social History  . Marital status: Single    Spouse name: N/A  . Number of children: N/A  . Years of education: N/A   Occupational History  . Not on file.   Social History Main Topics  . Smoking status: Current Every Day Smoker    Packs/day: 0.20    Years: 3.00    Types: Cigarettes  . Smokeless tobacco: Never Used     Comment: 2-3 per day  . Alcohol use 2.4 oz/week    4 Glasses of wine per week  . Drug use: No  . Sexual activity: Not on file   Other Topics Concern  . Not on file   Social History Narrative   Currently awaiting disability.   Cell# [phone]    Review of Systems: A complete ROS was negative except as per HPI.   Physical Exam: Blood  pressure (!) 158/75, pulse 97, temperature 99.1 F (37.3 C), temperature source Oral, resp. rate 18, height '5\' 3"'$  (1.6 m), weight 173 lb (78.5 kg), SpO2 95 %.  General appearance: Well-developed, well-nourished African American man sitting comfortably in bed, in no distress, pleasant and conversational HENT: Visible facial asymmetry with L facial swelling mass extending from below his ear around his jaw and half way down his neck, tense, immobile, warm, tender to palpation, oropharynx clear but hard to visualize due to soft palate, edentulous with dentures, sinuses nontender, minimal trismus, no palpable distinct lymph nodes, ear canals clear and symmetric Eyes: PERRL, non-icteric Cardiovascular: Regular rate and rhythm, no murmurs, rubs, gallops Respiratory: Clear to auscultation bilaterally, normal work of breathing Abdomen: BS+, soft, non-tender, non-distended Extremities: Normal bulk, no edema, 2+ peripheral pulses Skin: Warm, dry, intact Neuro: Alert and oriented, cranial nerves grossly intact Psych: Positive affect, clear speech, thoughts linear and goal-directed  Ct Soft Tissue Neck W Contrast Result Date: 05/17/2016  IMPRESSION: 19 x 19 mm 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 LEFT neck lymphadenopathy including LEFT level IIa to level 3 4.7 x 4.6 x 7.6 cm necrotic nodal conglomeration with suspected extracapsular spread of disease. Moderately narrowed hypopharynx. Electronically Signed: By: Elon Alas M.D. On: 05/17/2016 20:43     Assessment & Plan by Problem: Antonio [name] is a 61 yo man with PMH chronic sinus infections, COPD, tobacco and alcohol abuse presenting with progressive left neck mass.  Active Problems:   Mass of left side of neck  Painful neck mass, left, progressive growth and pain over 3-4 weeks, he is a smoker/drinker, no fevers or drainage, initial concern for malignancy vs  infection. He has been afebrile, HDS, with no sign of airway compromise. Initially started on IV clindamycin given concern for maxillofacial infection from oral source. However CT neck revealed invasive left supraglottic laryngeal mass consistent with primary SCC with left neck lymphadenopathy including large necrotic nodal conglomeration with suspected extracapsular spread. -- Obtain CBC, CMP -- ENT consulted, appreciate recs -- Scheduled for US-guided biopsy tomorrow with cytology and HPV testing -- Pending biopsy results will proceed with PET, medonc, radonc -- IV Clinda Q8h stopped given imaging findings -- IV fluids tonight -- Tylenol PRN for pain  Hypertension -- continue home Lisinopril 20 mg QD and HCTZ 25 mg QD  HLD -- continue home Lipitor 40 mg QD  Chronic leg pain -- continue home Gabapentin 300 mg TID  FEN/GI: Regular diet, NPO at MN, replete electrolytes as needed  DVT ppx: Lovenox  Code status: Full code  Dispo: Admit patient to Observation with expected length of stay less than 2 midnights.  Signed: Asencion Partridge, MD 05/17/2016, 4:16 PM  Pager: [phone]

## 2016-05-18 ENCOUNTER — Observation Stay (HOSPITAL_COMMUNITY): Payer: Medicare Other

## 2016-05-18 ENCOUNTER — Encounter (HOSPITAL_COMMUNITY): Payer: Self-pay | Admitting: Internal Medicine

## 2016-05-18 DIAGNOSIS — I10 Essential (primary) hypertension: Secondary | ICD-10-CM

## 2016-05-18 DIAGNOSIS — J449 Chronic obstructive pulmonary disease, unspecified: Secondary | ICD-10-CM | POA: Diagnosis not present

## 2016-05-18 DIAGNOSIS — R590 Localized enlarged lymph nodes: Secondary | ICD-10-CM

## 2016-05-18 DIAGNOSIS — R1310 Dysphagia, unspecified: Secondary | ICD-10-CM | POA: Diagnosis not present

## 2016-05-18 DIAGNOSIS — C770 Secondary and unspecified malignant neoplasm of lymph nodes of head, face and neck: Secondary | ICD-10-CM | POA: Diagnosis not present

## 2016-05-18 DIAGNOSIS — G8929 Other chronic pain: Secondary | ICD-10-CM | POA: Diagnosis not present

## 2016-05-18 DIAGNOSIS — F1010 Alcohol abuse, uncomplicated: Secondary | ICD-10-CM | POA: Diagnosis not present

## 2016-05-18 DIAGNOSIS — D573 Sickle-cell trait: Secondary | ICD-10-CM | POA: Diagnosis not present

## 2016-05-18 DIAGNOSIS — Q741 Congenital malformation of knee: Secondary | ICD-10-CM | POA: Diagnosis not present

## 2016-05-18 DIAGNOSIS — C760 Malignant neoplasm of head, face and neck: Secondary | ICD-10-CM

## 2016-05-18 DIAGNOSIS — F17210 Nicotine dependence, cigarettes, uncomplicated: Secondary | ICD-10-CM | POA: Diagnosis not present

## 2016-05-18 DIAGNOSIS — E7800 Pure hypercholesterolemia, unspecified: Secondary | ICD-10-CM | POA: Diagnosis not present

## 2016-05-18 DIAGNOSIS — C329 Malignant neoplasm of larynx, unspecified: Secondary | ICD-10-CM | POA: Diagnosis not present

## 2016-05-18 DIAGNOSIS — M79606 Pain in leg, unspecified: Secondary | ICD-10-CM

## 2016-05-18 DIAGNOSIS — Z79899 Other long term (current) drug therapy: Secondary | ICD-10-CM

## 2016-05-18 DIAGNOSIS — E785 Hyperlipidemia, unspecified: Secondary | ICD-10-CM | POA: Diagnosis not present

## 2016-05-18 DIAGNOSIS — R220 Localized swelling, mass and lump, head: Secondary | ICD-10-CM | POA: Diagnosis present

## 2016-05-18 DIAGNOSIS — Z9889 Other specified postprocedural states: Secondary | ICD-10-CM

## 2016-05-18 DIAGNOSIS — C321 Malignant neoplasm of supraglottis: Secondary | ICD-10-CM | POA: Diagnosis present

## 2016-05-18 DIAGNOSIS — J387 Other diseases of larynx: Secondary | ICD-10-CM

## 2016-05-18 DIAGNOSIS — Z8249 Family history of ischemic heart disease and other diseases of the circulatory system: Secondary | ICD-10-CM | POA: Diagnosis not present

## 2016-05-18 HISTORY — DX: Malignant neoplasm of head, face and neck: C76.0

## 2016-05-18 LAB — BASIC METABOLIC PANEL
ANION GAP: 10 (ref 5–15)
BUN: 11 mg/dL (ref 6–20)
CALCIUM: 9 mg/dL (ref 8.9–10.3)
CO2: 27 mmol/L (ref 22–32)
Chloride: 98 mmol/L — ABNORMAL LOW (ref 101–111)
Creatinine, Ser: 0.93 mg/dL (ref 0.61–1.24)
GFR calc Af Amer: >60 mL/min (ref >60)
GLUCOSE: 85 mg/dL (ref 65–99)
POTASSIUM: 3.9 mmol/L (ref 3.5–5.1)
Sodium: 135 mmol/L (ref 135–145)

## 2016-05-18 LAB — CBC
HEMATOCRIT: 39.1 % (ref 39.0–52.0)
Hemoglobin: 13.3 g/dL (ref 13.0–17.0)
MCH: 28.1 pg (ref 26.0–34.0)
MCHC: 34 g/dL (ref 30.0–36.0)
MCV: 82.5 fL (ref 78.0–100.0)
PLATELETS: 349 10*3/uL (ref 150–400)
RBC: 4.74 MIL/uL (ref 4.22–5.81)
RDW: 14.5 % (ref 11.5–15.5)
WBC: 7.2 10*3/uL (ref 4.0–10.5)

## 2016-05-18 LAB — PROTIME-INR
INR: 1.1
PROTHROMBIN TIME: 14.2 s (ref 11.4–15.2)

## 2016-05-18 LAB — GLUCOSE, CAPILLARY: GLUCOSE-CAPILLARY: 89 mg/dL (ref 65–99)

## 2016-05-18 LAB — HIV ANTIBODY (ROUTINE TESTING W REFLEX): HIV SCREEN 4TH GENERATION: NONREACTIVE

## 2016-05-18 LAB — APTT: APTT: 35 s (ref 24–36)

## 2016-05-18 MED ORDER — LIDOCAINE HCL 1 % IJ SOLN
INTRAMUSCULAR | Status: AC
  Filled 2016-05-18: qty 20

## 2016-05-18 MED ORDER — MIDAZOLAM HCL 2 MG/2ML IJ SOLN
INTRAMUSCULAR | Status: AC | PRN
  Administered 2016-05-18 (×2): 1 mg via INTRAVENOUS

## 2016-05-18 MED ORDER — FENTANYL CITRATE (PF) 100 MCG/2ML IJ SOLN
INTRAMUSCULAR | Status: AC
  Filled 2016-05-18: qty 2

## 2016-05-18 MED ORDER — MIDAZOLAM HCL 2 MG/2ML IJ SOLN
INTRAMUSCULAR | Status: AC
  Filled 2016-05-18: qty 2

## 2016-05-18 MED ORDER — SODIUM CHLORIDE 0.9 % IV SOLN
INTRAVENOUS | Status: AC | PRN
  Administered 2016-05-18: 10 mL/h via INTRAVENOUS

## 2016-05-18 MED ORDER — FENTANYL CITRATE (PF) 100 MCG/2ML IJ SOLN
INTRAMUSCULAR | Status: AC | PRN
  Administered 2016-05-18: 50 ug via INTRAVENOUS
  Administered 2016-05-18: 25 ug via INTRAVENOUS

## 2016-05-18 NOTE — Consult Note (Signed)
Chief Complaint: Patient was seen in consultation today for left neck mass  Referring Physician(s): Dr. [name]  Supervising Physician: Arne Cleveland  Patient Status: [facility] - In-pt  History of Present Illness: Antonio [name] is a 61 y.o. male with past medical history of COPD, DJD, GERD, HTN, and tobacco abuse who presented to [facility] with swollen left neck.   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  LEFT neck lymphadenopathy including LEFT level IIa to level 3 4.7 x 4.6 x 7.6 cm necrotic nodal conglomeration with suspected extracapsular spread of disease.  Moderately narrowed hypopharynx.  IR consulted for left neck mass biopsy at the request of [name].   Patient has been NPO.   Past Medical History:  Diagnosis Date  . Allergy    allergic rhinitis  . Arthritis    "qwhere" (05/17/2016)  . Chronic cough   . Chronic sinus infection   . Colonic polyp   . COPD (chronic obstructive pulmonary disease) ([place])   . 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 Dysphagia    normal EGD except for mild gastritis in 01/2006  . GERD (gastroesophageal reflux disease)   . High cholesterol   . Hypertension   . Rectal bleeding    colonoscopy in 01/2006  . Rectal polyp    01/2006 with path showing it to be benign, though with a question that it was incomplete biopsy, next scheduled coloscopy 2010  . Sickle cell trait ([place])   . Tobacco abuse   . Varus deformity of knee    bilateral, congenital    Past Surgical History:  Procedure Laterality Date  . COLONOSCOPY W/ BIOPSIES AND POLYPECTOMY  X 2  . MULTIPLE TOOTH EXTRACTIONS  ~ 2008   "took them all out"    Allergies: Patient has no known allergies.  Medications: Prior to Admission medications   Medication Sig Start Date End Date Taking? Authorizing Provider  atorvastatin (LIPITOR)  40 MG tablet Take 1 tablet (40 mg total) by mouth daily. 03/21/16 03/21/17 Yes Maryellen Pile, MD  cyclobenzaprine (FLEXERIL) 10 MG tablet Take 1 tablet (10 mg total) by mouth at bedtime. 08/25/15  Yes Maryellen Pile, MD  gabapentin (NEURONTIN) 300 MG capsule Take 1 capsule (300 mg total) by mouth 3 (three) times daily. 03/21/16  Yes Maryellen Pile, MD  hydrochlorothiazide (HYDRODIURIL) 25 MG tablet TAKE ONE TABLET BY MOUTH ONCE DAILY 07/20/15  Yes Axel Filler, MD  lisinopril (PRINIVIL,ZESTRIL) 20 MG tablet TAKE ONE TABLET BY MOUTH ONCE DAILY 05/16/16  Yes Maryellen Pile, MD  loratadine (CLARITIN) 10 MG tablet TAKE ONE TABLET BY MOUTH ONCE DAILY 07/20/15  Yes Axel Filler, MD  diclofenac sodium (VOLTAREN) 1 % GEL Apply 2 g topically 4 (four) times daily. Patient not taking: Reported on 05/17/2016 03/21/16   Maryellen Pile, MD     Family History  Problem Relation Age of Onset  . Cancer Mother   . Hypertension Father   . Diabetes Father   . Diabetes Sister   . CAD Sister     Social History   Social History  . Marital status: Single    Spouse name: N/A  . Number of children: N/A  . Years of education: N/A   Social History Main Topics  . Smoking status: Current Some Day Smoker    Packs/day: 0.12    Years: 43.00  Types: Cigarettes  . Smokeless tobacco: Never Used  . Alcohol use 14.4 oz/week    24 Cans of beer per week  . Drug use: Yes    Types: "Crack" cocaine, Marijuana     Comment: 05/17/2016 "quit marijuana in the 1970s til recently; started smoking to kill pain in my legs/knees; use crack very rarely"  . Sexual activity: Not Currently   Other Topics Concern  . None   Social History Narrative   Currently awaiting disability.   Cell# [phone]    Review of Systems  Constitutional: Negative for fatigue and fever.  HENT: Positive for facial swelling (left side of neck).   Respiratory: Negative for cough and shortness of breath.   Psychiatric/Behavioral: Negative for  behavioral problems and confusion.    Vital Signs: BP 108/74 (BP Location: Right Arm)   Pulse 78   Temp 98.1 F (36.7 C) (Oral)   Resp 18   Ht '5\' 3"'$  (1.6 m)   Wt 173 lb (78.5 kg)   SpO2 97%   BMI 30.65 kg/m   Physical Exam  Constitutional: He is oriented to person, place, and time. He appears well-developed.  Neck: Normal range of motion.  Cardiovascular: Normal rate, regular rhythm and normal heart sounds.   Pulmonary/Chest: Effort normal and breath sounds normal. No respiratory distress.  Lymphadenopathy:    He has cervical adenopathy (palpable mass on left).  Neurological: He is alert and oriented to person, place, and time.  Skin: Skin is warm and dry.  Psychiatric: He has a normal mood and affect. His behavior is normal. Judgment and thought content normal.  Nursing note and vitals reviewed.   Mallampati Score:  MD Evaluation Airway: WNL Heart: WNL Abdomen: WNL Chest/ Lungs: WNL ASA  Classification: 3 Mallampati/Airway Score: Two  Imaging: Ct Soft Tissue Neck W Contrast  Addendum Date: 05/17/2016   ADDENDUM REPORT: 05/17/2016 20:56 ADDENDUM: Acute findings discussed with and reconfirmed by Dr.Wolicki, ENT on 08/19/8240 at 8:54 pm. Acute findings discussed with and reconfirmed by Dr.Shorr on 05/17/2016 at 8:56 pm. Electronically Signed   By: Elon Alas M.D.   On: 05/17/2016 20:56   Result Date: 05/17/2016 CLINICAL DATA:  Facial tingling and LEFT facial swelling since March 13th. Worsening pain. History of chronic sinus infection, tobacco abuse. EXAM: CT NECK WITH CONTRAST TECHNIQUE: Multidetector CT imaging of the neck was performed using the standard protocol following the bolus administration of intravenous contrast. CONTRAST:  75 cc ISOVUE-300 IOPAMIDOL (ISOVUE-300) INJECTION 61% COMPARISON:  CT HEAD February 08, 2003 FINDINGS: PHARYNX AND LARYNX: LEFT hypopharyngeal/ piriform sinus mucosal to submucosal 19 x 19 mm mass invading the LEFT supraglottic fat, LEFT  glossopharyngeal fold, LEFT aryepiglottic fold. Moderately narrowed supraglottic airway. No definite strap muscle or thyroid cartilage invasion. Moderately narrowed hypopharynx. SALIVARY GLANDS: Normal. THYROID: Normal. LYMPH NODES: LEFT level IIa 2 level 3 4.7 x 4.6 x 7.6 cm (transverse by AP view 8 cc) nodal conglomeration with central necrosis. Additional small necrotic satellite lymph nodes. Hazy density along the inferior margin of the nodal conglomeration concerning for subcapsular spread of disease. 13 mm round LEFT level level 4 lymph node. VASCULAR: Mild calcific atherosclerosis. Moderate calcific atherosclerosis of the carotid bifurcations. LIMITED INTRACRANIAL: Normal. VISUALIZED ORBITS: Normal. MASTOIDS AND VISUALIZED PARANASAL SINUSES: Well-aerated. SKELETON: Nonacute.  Patient is edentulous. UPPER CHEST: Lung apices are clear. No superior mediastinal lymphadenopathy. OTHER: None. 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 LEFT neck lymphadenopathy  including LEFT level IIa to level 3 4.7 x 4.6 x 7.6 cm necrotic nodal conglomeration with suspected extracapsular spread of disease. Moderately narrowed hypopharynx. Electronically Signed: By: Elon Alas M.D. On: 05/17/2016 20:43    Labs:  CBC:  Recent Labs  05/17/16 1655 05/18/16 0359  WBC 7.7 7.2  HGB 14.2 13.3  HCT 41.2 39.1  PLT 378 349    COAGS:  Recent Labs  05/18/16 0642  INR 1.10  APTT 35    BMP:  Recent Labs  08/25/15 1445 05/17/16 1655 05/18/16 0359  NA 141 135 135  K 3.9 3.7 3.9  CL 98 96* 98*  CO2 '26 29 27  '$ GLUCOSE 95 94 85  BUN '7 11 11  '$ CALCIUM 9.5 9.6 9.0  CREATININE 1.02 1.06 0.93  GFRNONAA 80 >60 >60  GFRAA 93 >60 >60    LIVER FUNCTION TESTS:  Recent Labs  05/17/16 1655  BILITOT 1.0  AST 26  ALT 16*  ALKPHOS 72  PROT 7.5  ALBUMIN 4.4    TUMOR MARKERS: No results for input(s):  AFPTM, CEA, CA199, CHROMGRNA in the last 8760 hours.  Assessment and Plan: Antonio [name] is a 61 y.o. male with past medical history of COPD, DJD, GERD, HTN, and tobacco abuse who presented to [facility] with swollen left neck. IR consulted for lymph node/mass biopsy at the request of [name]. Case reviewed by Dr. Vernard Gambles who approves patient for procedure.  Hopeful for today if schedule allows.  Patient is NPO.  Thank you for this interesting consult.  I greatly enjoyed meeting Antonio [name] and look forward to participating in their care.  A copy of this report was sent to the requesting provider on this date.  Electronically Signed: Docia Barrier 05/18/2016, 9:59 AM   I spent a total of 40 Minutes    in face to face in clinical consultation, greater than 50% of which was counseling/coordinating care for left neck mass, enlarged lymph node

## 2016-05-18 NOTE — Progress Notes (Signed)
Internal Medicine Attending:   I saw and examined the patient. I reviewed the resident's note and I agree with the resident's findings and plan as documented in the resident's note.  61 year old man admitted with a new 2 x 2 cm laryngeal mass on the left side most likely representing a new malignancy given his risk factors of tobacco and alcohol use. He underwent an ultrasound-guided core biopsy of associated cervical lymphadenopathy today, had no post procedure bleeding. On CT scan he did have mild compression of his airway and does still have choking sensation when lying flat. For this we will keep him in house tonight and follow up with ENT to see if he needs any advanced treatment to be safe for discharge.

## 2016-05-18 NOTE — Procedures (Signed)
[place] core bx L cervical LAN  No complication No blood loss. See complete dictation in [facility].

## 2016-05-18 NOTE — Progress Notes (Signed)
Briefly, given CT images I went to check in on the patient overnight to ensure that he was not having difficulty breathing. When I went to see him he was sleeping comfortably. No reports from nursing overnight that he was having difficulty breathing.

## 2016-05-18 NOTE — Progress Notes (Signed)
Subjective: [name] has no complaints this morning, reports he has no issues with breathing and was able to eat and drink late last night. Seen by ENT and planned for biopsy of his neck mass today. We discussed with him the likely diagnosis of cancer and he seemed somewhat concerned, asked a few questions about next steps, and voiced understanding.   Objective: Vital signs in last 24 hours: Vitals:   05/17/16 1546 05/17/16 2125 05/18/16 0451  BP: (!) 158/75 (!) 146/75 108/74  Pulse: 97 87 78  Resp: '18 18 18  '$ Temp: 99.1 F (37.3 C) 98.7 F (37.1 C) 98.1 F (36.7 C)  TempSrc: Oral Oral Oral  SpO2: 95% 98% 97%  Weight: 173 lb (78.5 kg)    Height: '5\' 3"'$  (1.6 m)      Intake/Output Summary (Last 24 hours) at 05/18/16 1032 Last data filed at 05/18/16 0452  Gross per 24 hour  Intake                0 ml  Output              675 ml  Net             -675 ml    Physical Exam General appearance: WDWN man resting comfortably in bed, in no distress HENT: Firm, immobile mass palpable extending below left ear to mid neck, tender to palpation, oropharynx clear Cardiovascular: Regular rate and rhythm, no murmurs, rubs, gallops Respiratory/Chest: Clear to ausculation bilaterally, normal work of breathing Abdomen: Soft, non-tender, non-distended Extremities: No edema, warm and well-perfused   Labs / Imaging / Procedures: CBC Latest Ref Rng & Units 05/18/2016 05/17/2016 01/17/2012  WBC 4.0 - 10.5 K/uL 7.2 7.7 6.9  Hemoglobin 13.0 - 17.0 g/dL 13.3 14.2 16.3  Hematocrit 39.0 - 52.0 % 39.1 41.2 46.7  Platelets 150 - 400 K/uL 349 378 313   BMP Latest Ref Rng & Units 05/18/2016 05/17/2016 08/25/2015  Glucose 65 - 99 mg/dL 85 94 95  BUN 6 - 20 mg/dL '11 11 7  '$ Creatinine 0.61 - 1.24 mg/dL 0.93 1.06 1.02  BUN/Creat Ratio 9 - 20 - - 7(L)  Sodium 135 - 145 mmol/L 135 135 141  Potassium 3.5 - 5.1 mmol/L 3.9 3.7 3.9  Chloride 101 - 111 mmol/L 98(L) 96(L) 98  CO2 22 - 32 mmol/L '27 29 26  '$ Calcium 8.9  - 10.3 mg/dL 9.0 9.6 9.5   Ct Soft Tissue Neck W Contrast  Addendum Date: 05/17/2016   ADDENDUM REPORT: 05/17/2016 20:56 ADDENDUM: Acute findings discussed with and reconfirmed by Dr.Wolicki, ENT on 03/20/971 at 8:54 pm. Acute findings discussed with and reconfirmed by Dr.Shorr on 05/17/2016 at 8:56 pm. Electronically Signed   By: Elon Alas M.D.   On: 05/17/2016 20:56   Result Date: 05/17/2016 CLINICAL DATA:  Facial tingling and LEFT facial swelling since March 13th. Worsening pain. History of chronic sinus infection, tobacco abuse. EXAM: CT NECK WITH CONTRAST TECHNIQUE: Multidetector CT imaging of the neck was performed using the standard protocol following the bolus administration of intravenous contrast. CONTRAST:  75 cc ISOVUE-300 IOPAMIDOL (ISOVUE-300) INJECTION 61% COMPARISON:  CT HEAD February 08, 2003 FINDINGS: PHARYNX AND LARYNX: LEFT hypopharyngeal/ piriform sinus mucosal to submucosal 19 x 19 mm mass invading the LEFT supraglottic fat, LEFT glossopharyngeal fold, LEFT aryepiglottic fold. Moderately narrowed supraglottic airway. No definite strap muscle or thyroid cartilage invasion. Moderately narrowed hypopharynx. SALIVARY GLANDS: Normal. THYROID: Normal. LYMPH NODES: LEFT level IIa 2 level 3 4.7  x 4.6 x 7.6 cm (transverse by AP view 8 cc) nodal conglomeration with central necrosis. Additional small necrotic satellite lymph nodes. Hazy density along the inferior margin of the nodal conglomeration concerning for subcapsular spread of disease. 13 mm round LEFT level level 4 lymph node. VASCULAR: Mild calcific atherosclerosis. Moderate calcific atherosclerosis of the carotid bifurcations. LIMITED INTRACRANIAL: Normal. VISUALIZED ORBITS: Normal. MASTOIDS AND VISUALIZED PARANASAL SINUSES: Well-aerated. SKELETON: Nonacute.  Patient is edentulous. UPPER CHEST: Lung apices are clear. No superior mediastinal lymphadenopathy. OTHER: None. IMPRESSION: 19 x 19 mm locally invasive LEFT supraglottic laryngeal  mass consistent with primary head and neck cancer/squamous cell carcinoma. LEFT neck lymphadenopathy including LEFT level IIa to level 3 4.7 x 4.6 x 7.6 cm necrotic nodal conglomeration with suspected extracapsular spread of disease. Moderately narrowed hypopharynx. Electronically Signed: By: Elon Alas M.D. On: 05/17/2016 20:43    Assessment/Plan: 61 yr old man admitted for left sided neck mass, found to be likely malignant who underwent biopsy today.    Active Problems:   Mass of left side of neck  2x2 Left laryngeal mass: Likely malignancy. Patient had a CT done which showed 19x19 mm locally invasive left supraglottic laryngeal mass  And left neck lymphadenopathy. ENT was consulted and did laryngoscopy which showed a mildly compressed airway but no gross tumor erosion. Patient underwent a core needle biopsy today. Today he denies any dysphagia.   -ENT consulted and appreciate recs -follow up biopsy results -Patient will need follow up with Medical oncology and radiation oncology depending on the results and outpt PET scan.   HTN: stable -lisinopril daily -hctz daily  Chronic leg pain -gabapentin daily  HLD -lipitor   FEN: regular diet DVT ppx: lovenox Code: full   Dispo: Anticipated discharge in approximately 1 day(s).   LOS: 0 days   Burgess Estelle, MD 05/18/2016, 10:32 AM

## 2016-05-19 ENCOUNTER — Telehealth: Payer: Self-pay

## 2016-05-19 DIAGNOSIS — F1010 Alcohol abuse, uncomplicated: Secondary | ICD-10-CM

## 2016-05-19 DIAGNOSIS — C770 Secondary and unspecified malignant neoplasm of lymph nodes of head, face and neck: Secondary | ICD-10-CM | POA: Diagnosis not present

## 2016-05-19 DIAGNOSIS — F17210 Nicotine dependence, cigarettes, uncomplicated: Secondary | ICD-10-CM

## 2016-05-19 DIAGNOSIS — C329 Malignant neoplasm of larynx, unspecified: Secondary | ICD-10-CM

## 2016-05-19 DIAGNOSIS — C321 Malignant neoplasm of supraglottis: Secondary | ICD-10-CM | POA: Diagnosis not present

## 2016-05-19 NOTE — Addendum Note (Signed)
Addended by: Aldine Contes on: 05/19/2016 11:23 AM   Modules accepted: Level of Service

## 2016-05-19 NOTE — Discharge Summary (Signed)
Name: Antonio [name] MRN: [id number] DOB: 1955/03/24 61 y.o. PCP: Maryellen Pile, MD  Date of Admission: 05/17/2016  3:44 PM Date of Discharge: 05/19/2016 Attending Physician: Axel Filler, MD  Discharge Diagnosis: 1. Squamous cell carcinoma of the larynx 2. Tobacco and alcohol abuse  Principal Problem:   Squamous cell carcinoma of larynx (HCC) Active Problems:   TOBACCO ABUSE   Essential hypertension   Alcohol abuse   Discharge Medications: Allergies as of 05/19/2016   No Known Allergies     Medication List    TAKE these medications   atorvastatin 40 MG tablet Commonly known as:  LIPITOR Take 1 tablet (40 mg total) by mouth daily.   cyclobenzaprine 10 MG tablet Commonly known as:  FLEXERIL Take 1 tablet (10 mg total) by mouth at bedtime.   diclofenac sodium 1 % Gel Commonly known as:  VOLTAREN Apply 2 g topically 4 (four) times daily.   gabapentin 300 MG capsule Commonly known as:  NEURONTIN Take 1 capsule (300 mg total) by mouth 3 (three) times daily.   hydrochlorothiazide 25 MG tablet Commonly known as:  HYDRODIURIL TAKE ONE TABLET BY MOUTH ONCE DAILY   lisinopril 20 MG tablet Commonly known as:  PRINIVIL,ZESTRIL TAKE ONE TABLET BY MOUTH ONCE DAILY   loratadine 10 MG tablet Commonly known as:  CLARITIN TAKE ONE TABLET BY MOUTH ONCE DAILY       Disposition and follow-up:   [name] was discharged from [facility] in Good condition.  At the hospital follow up visit please address:  1.  Squamous cell carcinoma of the larynx - assess for mass growth and any new speech, swallowing, or respiratory compromise, ensure follow up with medonc, radonc, ENT  Tobacco and alcohol abuse - assess for any continued tobacco or alcohol use  2.  Labs / imaging needed at time of follow-up: PET staging scan  3.  Pending labs/ test needing follow-up: p16 / HPV status of his biopsy  Follow-up Appointments: Follow-up Information    [place]. Go on 06/02/2016.   Why:  At 9:45 AM Contact information: [address] [place] [phone]       [name], MD. Schedule an appointment as soon as possible for a visit.   Specialty:  Otolaryngology Contact information: [address] [place] [address] [phone]           Hospital Course by problem list: Principal Problem:   Squamous cell carcinoma of larynx (HCC) Active Problems:   TOBACCO ABUSE   Essential hypertension   Alcohol abuse   1.  Squamous cell carcinoma of the larynx  Mr. Feutz is a 61 y.o. man with PMH chronic sinus infections, COPD, osteoarthritis, HTN, GERD, tobacco and alcohol abuse who presented to IM clinic on 4/4 for painful growing mass on the left side of his face and neck. He had first noticed pain around this area 3 weeks prior that had progressed since then. He also reported dysphagia to some foods, odynophagia, and chocking sensation when laying flat on his back, resolving with change in position. On exam a firm tender immobile mass was palpated behind his left jaw. He was directly admitted for emergent evaluation and ENT was constulted. CT neck with contrast on 4/4 revealed locally invasive left supraglottic laryngeal mass consistent with primary head and neck SCCa and extensive left neck lymphadenopathy, suspected extracapsular spread, and moderately narrowed hypopharynx. On 4/5 IR took him for ultrasound  guided lymph node biopsy which resulted by 4/6 and demonstrated squamous cell carcinoma and evidence of tumor invading skeletal muscle. He remained stable without issues eating, drinking, walking, breathing throughout his hospital stay and was stable for discharge on 4/6 with med onc and IM clinic follow up.  Tobacco and alcohol abuse - on admission reported he has smoked a quarter pack of cigarettes daily for over 40 years, no chewing tobacco use, and drinks about  24 beers weekly. We extensively counseled cessation of these behaviors.  Discharge Vitals:   BP 112/76 (BP Location: Right Arm)   Pulse 84   Temp 98.8 F (37.1 C) (Oral)   Resp 18   Ht '5\' 3"'$  (1.6 m)   Wt 173 lb (78.5 kg)   SpO2 97%   BMI 30.65 kg/m   Pertinent Labs, Studies, and Procedures:   4/5 Surgical pathology   Dg Orthopantogram  Result Date: 05/18/2016 CLINICAL DATA:  Reason for exam: laryngeal cancer EXAM: ORTHOPANTOGRAM/PANORAMIC COMPARISON:  None. FINDINGS: There is a single mostly unerupted left mandibular third molar. Patient is otherwise edentulous. No fractures. No osteoblastic or osteolytic lesions. Temporomandibular joints are normally aligned. IMPRESSION: 1. No evidence of mandibular metastatic disease or fracture. Electronically Signed   By: Lajean Manes M.D.   On: 05/18/2016 21:43   Ct Soft Tissue Neck W Contrast  Addendum Date: 05/17/2016   ADDENDUM REPORT: 05/17/2016 20:56 ADDENDUM: Acute findings discussed with and reconfirmed by Dr.Wolicki, ENT on 02/16/863 at 8:54 pm. Acute findings discussed with and reconfirmed by Dr.Shorr on 05/17/2016 at 8:56 pm. Electronically Signed   By: Elon Alas M.D.   On: 05/17/2016 20:56   Result Date: 05/17/2016 CLINICAL DATA:  Facial tingling and LEFT facial swelling since March 13th. Worsening pain. History of chronic sinus infection, tobacco abuse. EXAM: CT NECK WITH CONTRAST TECHNIQUE: Multidetector CT imaging of the neck was performed using the standard protocol following the bolus administration of intravenous contrast. CONTRAST:  75 cc ISOVUE-300 IOPAMIDOL (ISOVUE-300) INJECTION 61% COMPARISON:  CT HEAD February 08, 2003 FINDINGS: PHARYNX AND LARYNX: LEFT hypopharyngeal/ piriform sinus mucosal to submucosal 19 x 19 mm mass invading the LEFT supraglottic fat, LEFT glossopharyngeal fold, LEFT aryepiglottic fold. Moderately narrowed supraglottic airway. No definite strap muscle or thyroid cartilage invasion. Moderately narrowed  hypopharynx. SALIVARY GLANDS: Normal. THYROID: Normal. LYMPH NODES: LEFT level IIa 2 level 3 4.7 x 4.6 x 7.6 cm (transverse by AP view 8 cc) nodal conglomeration with central necrosis. Additional small necrotic satellite lymph nodes. Hazy density along the inferior margin of the nodal conglomeration concerning for subcapsular spread of disease. 13 mm round LEFT level level 4 lymph node. VASCULAR: Mild calcific atherosclerosis. Moderate calcific atherosclerosis of the carotid bifurcations. LIMITED INTRACRANIAL: Normal. VISUALIZED ORBITS: Normal. MASTOIDS AND VISUALIZED PARANASAL SINUSES: Well-aerated. SKELETON: Nonacute.  Patient is edentulous. UPPER CHEST: Lung apices are clear. No superior mediastinal lymphadenopathy. OTHER: None. 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 LEFT neck lymphadenopathy including LEFT level IIa to level 3 4.7 x 4.6 x 7.6 cm necrotic nodal conglomeration with suspected extracapsular spread of disease. Moderately narrowed hypopharynx. Electronically Signed: By: Elon Alas M.D. On: 05/17/2016 20:43   US Biopsy  Result Date: 05/18/2016 CLINICAL DATA:  Supraglottic neoplasm. Confluent left cervical adenopathy. EXAM: ULTRASOUND GUIDED CORE BIOPSY OF LEFT CERVICAL ADENOPATHY MEDICATIONS: Intravenous Fentanyl and Versed were administered as conscious sedation during continuous monitoring of the patient's level of consciousness and physiological /  cardiorespiratory status by the radiology RN, with a total moderate sedation time of 10 minutes. PROCEDURE: The procedure, risks, benefits, and alternatives were explained to the patient. Questions regarding the procedure were encouraged and answered. The patient understands and consents to the procedure. Survey ultrasound of the left cervical region performed. Adenopathy was localized and an appropriate skin entry site was determined and  marked. The operative field was prepped with chlorhexidine in a sterile fashion, and a sterile drape was applied covering the operative field. A sterile gown and sterile gloves were used for the procedure. Local anesthesia was provided with 1% Lidocaine. Under real-time ultrasound guidance, a 17 gauge trocar needle was advanced to the margin of the lesion. Once needle tip position was confirmed, coaxial 18-gauge core biopsy samples were obtained, submitted in saline to surgical pathology. The guide needle was removed. Postprocedure scans show no hemorrhage or other apparent complication. The patient tolerated the procedure well. COMPLICATIONS: None. FINDINGS: Confluent left cervical adenopathy again localized. Representative core biopsy samples obtained as above. IMPRESSION: 1. Technically successful ultrasound-guided core biopsy of left cervical adenopathy. Electronically Signed   By: Lucrezia Europe M.D.   On: 05/18/2016 14:28    Discharge Instructions: Discharge Instructions    Diet - low sodium heart healthy    Complete by:  As directed    Discharge instructions    Complete by:  As directed    Mr. Klees unfortunately we have found that you have cancer that has spread from your throat into the nodes of your neck. Schedulers from medical and radiation oncology should call you within a few days to schedule appointments to get this treated. You should also follow up with [name], your ENT (ear nose and throat) doctor.   We have schedule a follow up visit on 4/20 at 9:45 with [place] in the internal medicine clinic to make sure you are doing well.  Please stop smoking and stop drinking alcohol regularlly!! This will make your cancer harder to treat and puts you at risk for developing more cancer.  If you develop difficulty breathing please visit the ER.   Increase activity slowly    Complete by:  As directed       Signed: Asencion Partridge, MD 05/20/2016, 2:02 PM   Pager: [phone]

## 2016-05-19 NOTE — Progress Notes (Signed)
Internal Medicine Clinic Attending  I saw and evaluated the patient.  I personally confirmed the key portions of the history and exam documented by Dr. [name] and I reviewed pertinent patient test results.  The assessment, diagnosis, and plan were formulated together and I agree with the documentation in the resident's note.  On exam patient noted to have a left sided cervical mass below his jaw line which was hard on palpation and immobile in addition to facial swelling and there was a concern for facial cellulitis as well as possible malignancy given exam findings and history of tobacco use. Patient was admitted to the hospital for imaging for further clarification of diagnosis and abx

## 2016-05-19 NOTE — Progress Notes (Signed)
Subjective: Antonio [name] remains asymptomatic this morning. He is eating, drinking, ambulating, and breathing well. He has minimal pain in his left neck, mildly worsened with movement. He experiences some choking sensation when lays flat on his back, but sleeps fine on his side. He is agreeable to return home today.  Pt understands to follow up with ENT next week for further management.   Objective: Vital signs in last 24 hours: Vitals:   05/18/16 1240 05/18/16 1254 05/18/16 2048 05/19/16 0413  BP: 112/72 116/64 121/72 112/76  Pulse: 79 70 89 84  Resp: '14 16 18 18  '$ Temp:  98.2 F (36.8 C) 99.9 F (37.7 C) 98.8 F (37.1 C)  TempSrc:  Oral Oral Oral  SpO2: 99% 94% 95% 97%  Weight:      Height:        Intake/Output Summary (Last 24 hours) at 05/19/16 0732 Last data filed at 05/19/16 0600  Gross per 24 hour  Intake              960 ml  Output              475 ml  Net              485 ml    Physical Exam General appearance: WDWN man resting comfortably in bed, in no distress HENT: Firm, immobile mass palpable extending below left ear to mid neck, tender to palpation, oropharynx clear Cardiovascular: Regular rate and rhythm, no murmurs, rubs, gallops Respiratory/Chest: Clear to ausculation bilaterally, normal work of breathing Abdomen: Soft, non-tender, non-distended Extremities: No edema, warm and well-perfused   Labs / Imaging / Procedures: CBC Latest Ref Rng & Units 05/18/2016 05/17/2016 01/17/2012  WBC 4.0 - 10.5 K/uL 7.2 7.7 6.9  Hemoglobin 13.0 - 17.0 g/dL 13.3 14.2 16.3  Hematocrit 39.0 - 52.0 % 39.1 41.2 46.7  Platelets 150 - 400 K/uL 349 378 313   BMP Latest Ref Rng & Units 05/18/2016 05/17/2016 08/25/2015  Glucose 65 - 99 mg/dL 85 94 95  BUN 6 - 20 mg/dL '11 11 7  '$ Creatinine 0.61 - 1.24 mg/dL 0.93 1.06 1.02  BUN/Creat Ratio 9 - 20 - - 7(L)  Sodium 135 - 145 mmol/L 135 135 141  Potassium 3.5 - 5.1 mmol/L 3.9 3.7 3.9  Chloride 101 - 111 mmol/L 98(L) 96(L) 98  CO2 22  - 32 mmol/L '27 29 26  '$ Calcium 8.9 - 10.3 mg/dL 9.0 9.6 9.5    Assessment/Plan: 61 yr old man admitted for left sided neck mass, found to be likely malignant who underwent biopsy today.    Principal Problem:   Head and neck malignancy ([place]) Active Problems:   TOBACCO ABUSE   Essential hypertension   Mass of left side of neck  Head and neck malignancy, likely SCC. CT revealed invasive left laryngeal mass and extensive ipsilateral LN involvement. ENT was consulted and did laryngoscopy which showed a mildly compressed airway but no gross tumor erosion. Underwent US-guided LN biopsy on 4/5.   -Pt is ok to discharge home per ENT. Will need follow up with ENT. They plan on discussing at tumor board next week and will need panendoscopy under anesthaesia for better staging.   Patient will need medical and radiation oncology, outpatient PET depending on biopsy.   HTN: -- continue home Lisinopril and HCTZ  Chronic leg pain -- continue home Gabapentin  HLD -- Lipitor  FEN: regular diet DVT ppx: lovenox Code: full   Dispo: Anticipated discharge today.  LOS: 1 day   Burgess Estelle, MD 05/19/2016, 7:32 AM

## 2016-05-19 NOTE — Telephone Encounter (Signed)
Hospital TOC per Dr Wynetta Emery, appt 06/02/2016.

## 2016-05-19 NOTE — Progress Notes (Signed)
05/19/2016 9:49 AM  [name] [id number]  Hops Day 2    Temp:  [98.2 F (36.8 C)-99.9 F (37.7 C)] 98.8 F (37.1 C) (04/06 0413) Pulse Rate:  [70-89] 84 (04/06 0413) Resp:  [14-19] 18 (04/06 0413) BP: (112-131)/(64-76) 112/76 (04/06 0413) SpO2:  [92 %-100 %] 97 % (04/06 0413),     Intake/Output Summary (Last 24 hours) at 05/19/16 0949 Last data filed at 05/19/16 0600  Gross per 24 hour  Intake              960 ml  Output              275 ml  Net              685 ml    No results found for this or any previous visit (from the past 24 hour(s)).   Panorex shows unerupted tooth #17.  SUBJECTIVE:  No change in symptoms.  Breathing and eating/drinking well.  OBJECTIVE:  Firm LEFT neck mass  IMPRESSION:  Likely laryngeal cancer.  Biopsy of neck mass done yesterday.  PLAN:  Needs OP PET/CT scan.  Needs Med Onc and Rad Onc appointments.  Will discuss at Tumor Board next Wed.  May need panendoscopy under anesthesia for better staging.   Antonio [name]

## 2016-05-19 NOTE — Progress Notes (Signed)
Patient discharged to home with instructions. 

## 2016-05-20 DIAGNOSIS — F1010 Alcohol abuse, uncomplicated: Secondary | ICD-10-CM

## 2016-05-22 ENCOUNTER — Encounter: Payer: Self-pay | Admitting: Hematology and Oncology

## 2016-05-22 ENCOUNTER — Telehealth: Payer: Self-pay | Admitting: Hematology and Oncology

## 2016-05-22 NOTE — Telephone Encounter (Signed)
Tc to the pt to schedule an appt. Appt has been scheduled for the pt to see [name] on 4/18 at 11am. Aware to arrive 30 minutes early. Letter mailed.

## 2016-05-31 ENCOUNTER — Ambulatory Visit (HOSPITAL_BASED_OUTPATIENT_CLINIC_OR_DEPARTMENT_OTHER): Payer: Medicare Other | Admitting: Hematology and Oncology

## 2016-05-31 ENCOUNTER — Encounter (HOSPITAL_COMMUNITY): Payer: Self-pay | Admitting: Dentistry

## 2016-05-31 ENCOUNTER — Encounter: Payer: Self-pay | Admitting: Hematology and Oncology

## 2016-05-31 ENCOUNTER — Encounter: Payer: Self-pay | Admitting: *Deleted

## 2016-05-31 ENCOUNTER — Ambulatory Visit (HOSPITAL_COMMUNITY): Payer: Self-pay | Admitting: Dentistry

## 2016-05-31 VITALS — BP 126/86 | HR 103 | Temp 99.5°F | Resp 18 | Ht 63.0 in | Wt 168.0 lb

## 2016-05-31 VITALS — BP 120/61 | HR 90 | Temp 98.3°F

## 2016-05-31 DIAGNOSIS — Z800 Family history of malignant neoplasm of digestive organs: Secondary | ICD-10-CM

## 2016-05-31 DIAGNOSIS — K08409 Partial loss of teeth, unspecified cause, unspecified class: Secondary | ICD-10-CM

## 2016-05-31 DIAGNOSIS — R634 Abnormal weight loss: Secondary | ICD-10-CM

## 2016-05-31 DIAGNOSIS — G893 Neoplasm related pain (acute) (chronic): Secondary | ICD-10-CM

## 2016-05-31 DIAGNOSIS — F17200 Nicotine dependence, unspecified, uncomplicated: Secondary | ICD-10-CM

## 2016-05-31 DIAGNOSIS — K011 Impacted teeth: Secondary | ICD-10-CM

## 2016-05-31 DIAGNOSIS — C321 Malignant neoplasm of supraglottis: Secondary | ICD-10-CM | POA: Diagnosis not present

## 2016-05-31 DIAGNOSIS — Z01818 Encounter for other preprocedural examination: Secondary | ICD-10-CM

## 2016-05-31 DIAGNOSIS — K0820 Unspecified atrophy of edentulous alveolar ridge: Secondary | ICD-10-CM

## 2016-05-31 DIAGNOSIS — C329 Malignant neoplasm of larynx, unspecified: Secondary | ICD-10-CM

## 2016-05-31 DIAGNOSIS — R471 Dysarthria and anarthria: Secondary | ICD-10-CM

## 2016-05-31 DIAGNOSIS — F1010 Alcohol abuse, uncomplicated: Secondary | ICD-10-CM

## 2016-05-31 DIAGNOSIS — Z720 Tobacco use: Secondary | ICD-10-CM

## 2016-05-31 DIAGNOSIS — K08109 Complete loss of teeth, unspecified cause, unspecified class: Secondary | ICD-10-CM

## 2016-05-31 DIAGNOSIS — Z972 Presence of dental prosthetic device (complete) (partial): Secondary | ICD-10-CM

## 2016-05-31 NOTE — Assessment & Plan Note (Signed)
We have extensive discussion at the ENT tumor board today He would need PET CT scan for staging, multidisciplinary clinic involving referral to radiation oncologist, speech and language therapist, social worker, dietitian and others He has appointment to see dentist today for final recommendation regarding extraction of her remaining tooth I warned him the need for upfront placement of feeding tube and port, pending result from PET CT scan I plan to see him back in roughly 2 weeks from now after imaging study is available to discuss final treatment recommendation

## 2016-05-31 NOTE — Assessment & Plan Note (Signed)
I spent some time counseling the patient the importance of tobacco cessation. We discussed common strategies including nicotine patches, Tobacco Quit-line, and other nicotine replacement products to assist in hiseffort to quit  he appears motivated to quit on his own  

## 2016-05-31 NOTE — Assessment & Plan Note (Signed)
He has pain likely due to cancer He is on gabapentin for now Continue the same and OTC analgesics until we have further information

## 2016-05-31 NOTE — Patient Instructions (Signed)

## 2016-05-31 NOTE — Assessment & Plan Note (Signed)
Due to anorexia from cancer Recommend increase oral intake as tolerated and will refer him to see dietitian

## 2016-05-31 NOTE — Assessment & Plan Note (Signed)
This is likely due to extensive involvement of the cancer I recommend speech and language assessment

## 2016-05-31 NOTE — Progress Notes (Signed)
DENTAL CONSULTATION  Date of Consultation:  05/31/2016 Patient Name:   Antonio [name] Date of Birth:   07/15/1955 Medical Record Number: [id number]  VITALS: BP 120/61 (BP Location: Left Arm)   Pulse 90   Temp 98.3 F (36.8 C) (Oral)   CHIEF COMPLAINT: Patient referred by Dr. Alvy [name] and Dr. Erik [name] for a dental consultation.  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  Patient with anticipated chemoradiation therapy. Patient is now seen as part of a medically necessary pre-chemoradiation therapy dental protocol examination.  The patient currently denies acute toothaches, swellings, or abscesses. Patient had multiple teeth extracted in 2008 with subsequent fabrication of upper and lower complete dentures. This was with Dr. Regino [name] by patient report. Patient denies having any problems with his dentures. Patient indicates that the dentures "fit good". Patient is not seen for regular denture recall. Patient is aware that he has tooth #17 remaining as an impacted wisdom tooth. Patient indicates he is able to feel part of the tooth in his mouth.  Patient denies having any problems with swelling or infection in this area, however. The patient denies having dental phobia.   PROBLEM LIST: Patient Active Problem List   Diagnosis Date Noted  . Alcohol abuse 05/20/2016  . 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 Left facial swelling 05/17/2016  . Sciatic leg pain 02/24/2015  . Preventative health care 12/01/2014  . Hyperlipidemia 02/20/2013  . Varus deformity of knee 06/16/2011  . TOBACCO ABUSE 05/07/2006  . COLONIC POLYPS 03/14/2006  . Allergic rhinitis 01/19/2006  . COPD 01/19/2006  . Essential hypertension 01/11/2006  . Osteoarthrosis involving lower leg 01/11/2006    PMH: Past Medical History:  Diagnosis Date  . Allergy    allergic rhinitis  .  Arthritis    "qwhere" (05/17/2016)  . Chronic cough   . Chronic sinus infection   . Colonic polyp   . COPD (chronic obstructive pulmonary disease) ([place])   . 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 Dysphagia    normal EGD except for mild gastritis in 01/2006  . GERD (gastroesophageal reflux disease)   . Head and neck malignancy ([place]) 05/18/2016  . High cholesterol   . Hypertension   . Rectal bleeding    colonoscopy in 01/2006  . Rectal polyp    01/2006 with path showing it to be benign, though with a question that it was incomplete biopsy, next scheduled coloscopy 2010  . Sickle cell trait ([place])   . Tobacco abuse   . Varus deformity of knee    bilateral, congenital    PSH: Past Surgical History:  Procedure Laterality Date  . COLONOSCOPY W/ BIOPSIES AND POLYPECTOMY  X 2  . MULTIPLE TOOTH EXTRACTIONS  ~ 2008   "took them all out"    ALLERGIES: No Known Allergies  MEDICATIONS: Current Outpatient Prescriptions  Medication Sig Dispense Refill  . atorvastatin (LIPITOR) 40 MG tablet Take 1 tablet (40 mg total) by mouth daily. 30 tablet 2  . cyclobenzaprine (FLEXERIL) 10 MG tablet Take 1 tablet (10 mg total) by mouth at bedtime. 30 tablet 1  . gabapentin (NEURONTIN) 300 MG capsule Take 1 capsule (300 mg total) by mouth 3 (three) times daily. 90 capsule 3  . hydrochlorothiazide (HYDRODIURIL) 25 MG tablet TAKE ONE TABLET BY MOUTH ONCE DAILY 90 tablet 3  . lisinopril (PRINIVIL,ZESTRIL) 20 MG tablet  TAKE ONE TABLET BY MOUTH ONCE DAILY 90 tablet 2  . loratadine (CLARITIN) 10 MG tablet TAKE ONE TABLET BY MOUTH ONCE DAILY 90 tablet 3  . diclofenac sodium (VOLTAREN) 1 % GEL Apply 2 g topically 4 (four) times daily. (Patient not taking: Reported on 05/17/2016) 1 Tube 2   No current facility-administered medications for this visit.     LABS: Lab Results  Component Value Date   WBC 7.2 05/18/2016   HGB 13.3 05/18/2016   HCT 39.1 05/18/2016   MCV 82.5 05/18/2016    PLT 349 05/18/2016      Component Value Date/Time   NA 135 05/18/2016 0359   NA 141 08/25/2015 1445   K 3.9 05/18/2016 0359   CL 98 (L) 05/18/2016 0359   CO2 27 05/18/2016 0359   GLUCOSE 85 05/18/2016 0359   BUN 11 05/18/2016 0359   BUN 7 08/25/2015 1445   CREATININE 0.93 05/18/2016 0359   CREATININE 0.86 02/04/2013 1129   CALCIUM 9.0 05/18/2016 0359   GFRNONAA >60 05/18/2016 0359   GFRAA >60 05/18/2016 0359   Lab Results  Component Value Date   INR 1.10 05/18/2016   No results found for: PTT  SOCIAL HISTORY: Social History   Social History  . Marital status: Single    Spouse name: N/A  . Number of children: N/A  . Years of education: N/A   Occupational History  . Not on file.   Social History Main Topics  . Smoking status: Current Some Day Smoker    Packs/day: 0.12    Years: 43.00    Types: Cigarettes  . Smokeless tobacco: Never Used  . Alcohol use 14.4 oz/week    24 Cans of beer per week  . Drug use: Yes    Types: "Crack" cocaine, Marijuana     Comment: 05/17/2016 "quit marijuana in the 1970s til recently; started smoking to kill pain in my legs/knees; use crack very rarely"  . Sexual activity: Not Currently   Other Topics Concern  . Not on file   Social History Narrative   Currently awaiting disability.   Cell# [phone]    FAMILY HISTORY: Family History  Problem Relation Age of Onset  . Cancer Mother     gastric ca  . Hypertension Father   . Diabetes Father   . Diabetes Sister   . CAD Sister     REVIEW OF SYSTEMS: Reviewed With the patient as per history of present illness. Psych: Patient denies having dental phobia.  DENTAL HISTORY: CHIEF COMPLAINT: Patient referred by Dr. Alvy [name] and Dr. Erik [name] for a dental consultation.  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  Patient with anticipated chemoradiation therapy.  Patient is now seen as part of a medically necessary pre-chemoradiation therapy dental protocol examination.  The patient currently denies acute toothaches, swellings, or abscesses. Patient had multiple teeth extracted in 2008 with subsequent fabrication of upper and lower complete dentures. This was with Dr. Regino [name] by patient report. Patient denies having any problems with his dentures. Patient indicates that the dentures "fit good". Patient is not seen for regular denture recall. Patient is aware that he has tooth #17 remaining as an impacted wisdom tooth. Patient indicates he is able to feel part of the tooth in his mouth.  Patient denies having any problems with swelling or infection in this area, however. The patient denies having dental phobia.  DENTAL  EXAMINATION: GENERAL: The patient is a well-developed, well-nourished male in no acute distress. HEAD AND NECK: The patient has left neck lymphadenopathy. The patient denies acute TMJ symptoms. INTRAORAL EXAM: Patient has normal saliva. There is atrophy of the edentulous alveolar ridges. The patient has the cusp tips of tooth numbers 17 that are clinically noted in the oral cavity. No obvious purulence or infection is noted around this impacted tooth. DENTITION: Patient has 1 remaining tooth numbers 17 that is a full bony impaction but part of the cusp tips are clinically noted upon exam in the oral cavity.  PROSTHODONTIC:  The patient has upper and lower complete dentures. The dentures are stable and retentive. The denture teeth of the upper lower complete dentures are worn. The patient denies having any problems with his dentures at this time. The patient ideally could be evaluated for a new set of upper lower complete dentures. OCCLUSION: The occlusion of the dentures is less than ideal but acceptable by patient report. The patient end to end occlusion of tooth numbers 23 through 27 with tooth numbers 6 through 10.   RADIOGRAPHIC  INTERPRETATION: An orthopantogram was taken on 05/18/2016 at [facility]. The patient is edentulous with the exception of full bony impacted #17. There is atrophy of the edentulous alveolar ridges. The bilateral maxillary sinuses are noted.  ASSESSMENTS: 1. 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  2. Pre-chemoradiation therapy dental protocol 3. The patient is missing all teeth with the exception of an impacted third molar #17 with the cusp tips exposed into the oral cavity. 4. Upper and lower complete dentures that are less than ideal but acceptable to the patient at this time.   PLAN/RECOMMENDATIONS: 1. I discussed the risks, benefits, and complications of various treatment options with the patient in relationship to his medical and dental conditions, anticipated chemoradiation therapy and chemoradiation therapy side effects to include xerostomia, radiation caries, trismus, mucositis, taste changes, gum and jawbone changes, and risk for infection and osteoradionecrosis. We discussed various treatment options to include no treatment, referral to an oral surgeon for evaluation for extraction of tooth number 17, and Follow up with Dentist of his choice for evaluation for new upper and lower complete dentures after chemoradiation therapy has been completed. The patient currently wishes to proceed with referral to Dr. Frederik Schmidt, oral surgeon, for evaluation for extraction of tooth #17. This consultation has been scheduled for Thursday, 06/01/2016 at 2:15 PM. Patient may need three weeks of healing befor start of chemoradiation therapy due to complexity of the extraction.   2. Discussion of findings with medical team and coordination of future medical and dental care as needed.  I spent in excess of  90 minutes during the conduct of this consultation and >50% of this time involved direct face-to-face encounter for counseling and/or coordination of the  patient's care.    Lenn Cal, DDS

## 2016-05-31 NOTE — Progress Notes (Signed)
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 male is here because of newly diagnosed squamous cell carcinoma According to the patient, the first initial presentation was due to pain on the left side of the ear with sensation of mild sore throat and mild choking.   Family has noted changes in the quality of his voice and abnormal weight loss over the last 6 weeks he denies any hearing deficit, difficulties with chewing food or painful swallowing. I review his records extensively and summarized as follows:   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[place])   05/17/2016 Imaging    Ct neck: 19 x 19 mm 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 LEFT neck lymphadenopathy including LEFT level IIa to level 3 4.7 x 4.6 x 7.6 cm necrotic nodal conglomeration with suspected extracapsular spread of disease. Moderately narrowed hypopharynx.       05/18/2016 Procedure    Technically successful ultrasound-guided core biopsy of left cervical adenopathy.       05/18/2016 Pathology Results    Lymph node, needle/core biopsy, L cerv LAN - SQUAMOUS CELL CARCINOMA, SEE COMMENT. Microscopic Comment Residual lymph node is not seen. There is tumor invading skeletal muscle. p16 weakly positive      The patient is currently not working. He does not drive. He relies on family members for transportation He has extensive smoking and drinking history for the last 40 years  MEDICAL HISTORY:  Past Medical History:  Diagnosis Date  . Allergy    allergic rhinitis  . Arthritis    "qwhere" (05/17/2016)  . Chronic cough   . Chronic sinus infection   . Colonic polyp   . COPD (chronic obstructive pulmonary  disease) ([place])   . 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 Dysphagia    normal EGD except for mild gastritis in 01/2006  . GERD (gastroesophageal reflux disease)   . Head and neck malignancy ([place]) 05/18/2016  . High cholesterol   . Hypertension   . Rectal bleeding    colonoscopy in 01/2006  . Rectal polyp    01/2006 with path showing it to be benign, though with a question that it was incomplete biopsy, next scheduled coloscopy 2010  . Sickle cell trait ([place])   . Tobacco abuse   . Varus deformity of knee    bilateral, congenital    SURGICAL HISTORY: Past Surgical History:  Procedure Laterality Date  . COLONOSCOPY W/ BIOPSIES AND POLYPECTOMY  X 2  . MULTIPLE TOOTH EXTRACTIONS  ~ 2008   "took them all out"    SOCIAL HISTORY: Social History   Social History  . Marital status: Single    Spouse name: N/A  . Number of children: N/A  . Years of education: N/A   Occupational History  . Not on file.   Social History Main Topics  . Smoking status: Current Some Day Smoker    Packs/day: 0.12    Years: 43.00    Types: Cigarettes  . Smokeless tobacco: Never Used  . Alcohol use 14.4 oz/week    24 Cans of beer per week  . Drug use: Yes    Types: "Crack" cocaine, Marijuana     Comment: 05/17/2016 "quit marijuana in the 1970s til  recently; started smoking to kill pain in my legs/knees; use crack very rarely"  . Sexual activity: Not Currently   Other Topics Concern  . Not on file   Social History Narrative   Currently awaiting disability.   Cell# [phone]    FAMILY HISTORY: Family History  Problem Relation Age of Onset  . Cancer Mother     gastric ca  . Hypertension Father   . Diabetes Father   . Diabetes Sister   . CAD Sister     ALLERGIES:  has No Known Allergies.  MEDICATIONS:  Current Outpatient Prescriptions  Medication Sig Dispense Refill  . atorvastatin (LIPITOR) 40 MG tablet Take 1 tablet (40 mg total) by mouth daily. 30 tablet 2  .  cyclobenzaprine (FLEXERIL) 10 MG tablet Take 1 tablet (10 mg total) by mouth at bedtime. 30 tablet 1  . gabapentin (NEURONTIN) 300 MG capsule Take 1 capsule (300 mg total) by mouth 3 (three) times daily. 90 capsule 3  . hydrochlorothiazide (HYDRODIURIL) 25 MG tablet TAKE ONE TABLET BY MOUTH ONCE DAILY 90 tablet 3  . lisinopril (PRINIVIL,ZESTRIL) 20 MG tablet TAKE ONE TABLET BY MOUTH ONCE DAILY 90 tablet 2  . loratadine (CLARITIN) 10 MG tablet TAKE ONE TABLET BY MOUTH ONCE DAILY 90 tablet 3  . diclofenac sodium (VOLTAREN) 1 % GEL Apply 2 g topically 4 (four) times daily. (Patient not taking: Reported on 05/17/2016) 1 Tube 2   No current facility-administered medications for this visit.     REVIEW OF SYSTEMS:   Constitutional: Denies fevers, chills or abnormal night sweats Eyes: Denies blurriness of vision, double vision or watery eyes Respiratory: Denies cough, dyspnea or wheezes Cardiovascular: Denies palpitation, chest discomfort or lower extremity swelling Gastrointestinal:  Denies nausea, heartburn or change in bowel habits Skin: Denies abnormal skin rashes Lymphatics: Denies new lymphadenopathy or easy bruising Neurological:Denies numbness, tingling or new weaknesses Behavioral/Psych: Mood is stable, no new changes  All other systems were reviewed with the patient and are negative.  PHYSICAL EXAMINATION: ECOG PERFORMANCE STATUS: 1 - Symptomatic but completely ambulatory  Vitals:   05/31/16 1036  BP: 126/86  Pulse: (!) 103  Resp: 18  Temp: 99.5 F (37.5 C)   Filed Weights   05/31/16 1036  Weight: 168 lb (76.2 kg)    GENERAL:alert, no distress and comfortable SKIN: skin color, texture, turgor are normal, no rashes or significant lesions EYES: normal, conjunctiva are pink and non-injected, sclera clear OROPHARYNX:no exudate, no erythema and lips, buccal mucosa, and tongue normal  NECK: supple, thyroid normal size, non-tender, without nodularity LYMPH: Large palpable  lymphadenopathy on the left side of his neck LUNGS: clear to auscultation and percussion with normal breathing effort HEART: regular rate & rhythm and no murmurs and no lower extremity edema ABDOMEN:abdomen soft, non-tender and normal bowel sounds Musculoskeletal:no cyanosis of digits and no clubbing  PSYCH: alert & oriented x 3 with mild dysarthria NEURO: no focal motor/sensory deficits  LABORATORY DATA:  I have reviewed the data as listed Lab Results  Component Value Date   WBC 7.2 05/18/2016   HGB 13.3 05/18/2016   HCT 39.1 05/18/2016   MCV 82.5 05/18/2016   PLT 349 05/18/2016   Lab Results  Component Value Date   NA 135 05/18/2016   K 3.9 05/18/2016   CL 98 (L) 05/18/2016   CO2 27 05/18/2016    RADIOGRAPHIC STUDIES: I reviewed the imaging study with the patient and family I have personally reviewed the radiological images as listed and agreed  with the findings in the report. Dg Orthopantogram  Result Date: 05/18/2016 CLINICAL DATA:  Reason for exam: laryngeal cancer EXAM: ORTHOPANTOGRAM/PANORAMIC COMPARISON:  None. FINDINGS: There is a single mostly unerupted left mandibular third molar. Patient is otherwise edentulous. No fractures. No osteoblastic or osteolytic lesions. Temporomandibular joints are normally aligned. IMPRESSION: 1. No evidence of mandibular metastatic disease or fracture. Electronically Signed   By: Lajean Manes M.D.   On: 05/18/2016 21:43   Ct Soft Tissue Neck W Contrast  Addendum Date: 05/17/2016   ADDENDUM REPORT: 05/17/2016 20:56 ADDENDUM: Acute findings discussed with and reconfirmed by Dr.Wolicki, ENT on 09/14/4479 at 8:54 pm. Acute findings discussed with and reconfirmed by Dr.Shorr on 05/17/2016 at 8:56 pm. Electronically Signed   By: Elon Alas M.D.   On: 05/17/2016 20:56   Result Date: 05/17/2016 CLINICAL DATA:  Facial tingling and LEFT facial swelling since March 13th. Worsening pain. History of chronic sinus infection, tobacco abuse. EXAM: CT  NECK WITH CONTRAST TECHNIQUE: Multidetector CT imaging of the neck was performed using the standard protocol following the bolus administration of intravenous contrast. CONTRAST:  75 cc ISOVUE-300 IOPAMIDOL (ISOVUE-300) INJECTION 61% COMPARISON:  CT HEAD February 08, 2003 FINDINGS: PHARYNX AND LARYNX: LEFT hypopharyngeal/ piriform sinus mucosal to submucosal 19 x 19 mm mass invading the LEFT supraglottic fat, LEFT glossopharyngeal fold, LEFT aryepiglottic fold. Moderately narrowed supraglottic airway. No definite strap muscle or thyroid cartilage invasion. Moderately narrowed hypopharynx. SALIVARY GLANDS: Normal. THYROID: Normal. LYMPH NODES: LEFT level IIa 2 level 3 4.7 x 4.6 x 7.6 cm (transverse by AP view 8 cc) nodal conglomeration with central necrosis. Additional small necrotic satellite lymph nodes. Hazy density along the inferior margin of the nodal conglomeration concerning for subcapsular spread of disease. 13 mm round LEFT level level 4 lymph node. VASCULAR: Mild calcific atherosclerosis. Moderate calcific atherosclerosis of the carotid bifurcations. LIMITED INTRACRANIAL: Normal. VISUALIZED ORBITS: Normal. MASTOIDS AND VISUALIZED PARANASAL SINUSES: Well-aerated. SKELETON: Nonacute.  Patient is edentulous. UPPER CHEST: Lung apices are clear. No superior mediastinal lymphadenopathy. OTHER: None. 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 LEFT neck lymphadenopathy including LEFT level IIa to level 3 4.7 x 4.6 x 7.6 cm necrotic nodal conglomeration with suspected extracapsular spread of disease. Moderately narrowed hypopharynx. Electronically Signed: By: Elon Alas M.D. On: 05/17/2016 20:43   US Biopsy  Result Date: 05/18/2016 CLINICAL DATA:  Supraglottic neoplasm. Confluent left cervical adenopathy. EXAM: ULTRASOUND GUIDED CORE BIOPSY OF LEFT CERVICAL ADENOPATHY MEDICATIONS: Intravenous Fentanyl and  Versed were administered as conscious sedation during continuous monitoring of the patient's level of consciousness and physiological / cardiorespiratory status by the radiology RN, with a total moderate sedation time of 10 minutes. PROCEDURE: The procedure, risks, benefits, and alternatives were explained to the patient. Questions regarding the procedure were encouraged and answered. The patient understands and consents to the procedure. Survey ultrasound of the left cervical region performed. Adenopathy was localized and an appropriate skin entry site was determined and marked. The operative field was prepped with chlorhexidine in a sterile fashion, and a sterile drape was applied covering the operative field. A sterile gown and sterile gloves were used for the procedure. Local anesthesia was provided with 1% Lidocaine. Under real-time ultrasound guidance, a 17 gauge trocar needle was advanced to the margin of the lesion. Once needle tip position was confirmed, coaxial 18-gauge core biopsy samples were obtained, submitted in saline to surgical pathology. The guide needle was removed. Postprocedure  scans show no hemorrhage or other apparent complication. The patient tolerated the procedure well. COMPLICATIONS: None. FINDINGS: Confluent left cervical adenopathy again localized. Representative core biopsy samples obtained as above. IMPRESSION: 1. Technically successful ultrasound-guided core biopsy of left cervical adenopathy. Electronically Signed   By: Lucrezia Europe M.D.   On: 05/18/2016 14:28    ASSESSMENT:  Newly diagnosed squamous cell carcinoma of the Head & Neck, HPV Negative  PLAN:  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[place]) We have extensive discussion at the ENT tumor board today He would need PET CT scan for staging, multidisciplinary clinic involving referral to radiation oncologist, speech and language therapist, social worker, dietitian and others He has appointment to see dentist today for final  recommendation regarding extraction of her remaining tooth I warned him the need for upfront placement of feeding tube and port, pending result from PET CT scan I plan to see him back in roughly 2 weeks from now after imaging study is available to discuss final treatment recommendation  Alcohol abuse He has extensive history of alcohol abuse I recommend he slowly reduce alcohol intake and quit within the next 2-3 weeks  Dysarthria This is likely due to extensive involvement of the cancer I recommend speech and language assessment  TOBACCO ABUSE I spent some time counseling the patient the importance of tobacco cessation. We discussed common strategies including nicotine patches, Tobacco Quit-line, and other nicotine replacement products to assist in hiseffort to quit  he appears motivated to quit on his own   Weight loss Due to anorexia from cancer Recommend increase oral intake as tolerated and will refer him to see dietitian  Cancer associated pain He has pain likely due to cancer He is on gabapentin for now Continue the same and OTC analgesics until we have further information    Orders Placed This Encounter  Procedures  . NM PET Image Initial (PI) Skull Base To Thigh    Standing Status:   Future    Standing Expiration Date:   05/31/2017    Order Specific Question:   Reason for Exam (SYMPTOM  OR DIAGNOSIS REQUIRED)    Answer:   laryngeal ca    Order Specific Question:   Preferred imaging location?    Answer:   [facility]  . Ambulatory Referral to Speech Therapy  (specifically to Garald Balding)    Referral Priority:   Routine    Referral Type:   Speech Therapy    Referral Reason:   Specialty Services Required    Requested Specialty:   Speech Pathology    Number of Visits Requested:   1  . Amb Referral to Nutrition and Diabetic Education (specifically to Ernestene Kiel)    Referral Priority:   Routine    Referral Type:   Consultation    Referral Reason:    Specialty Services Required    Number of Visits Requested:   1  . Ambulatory Referral to Social Work    Referral Priority:   Routine    Referral Type:   Consultation    Referral Reason:   Specialty Services Required    Number of Visits Requested:   1  . Ambulatory Referral to Radiation Oncology    Referral Priority:   Routine    Referral Type:   Consultation    Referral Reason:   Specialty Services Required    Requested Specialty:   Radiation Oncology    Number of Visits Requested:   1  . Ambulatory Referral to Physical Therapy  Referral Priority:   Routine    Referral Type:   Physical Medicine    Referral Reason:   Specialty Services Required    Requested Specialty:   Physical Therapy    Number of Visits Requested:   1    All questions were answered. The patient knows to call the clinic with any problems, questions or concerns. I spent 60 minutes counseling the patient face to face. The total time spent in the appointment was 90 minutes and more than 50% was on counseling.     [name], MD 05/31/16 2:31 PM

## 2016-05-31 NOTE — Assessment & Plan Note (Signed)
He has extensive history of alcohol abuse I recommend he slowly reduce alcohol intake and quit within the next 2-3 weeks

## 2016-06-01 NOTE — Progress Notes (Signed)
Oncology Nurse Navigator Documentation  Met with Mr. Gappa during initial consult with [name].  He was accompanied by his sister and nephew. 1. Further introduced myself as his/their Navigator, explained my role as a member of the Care Team. 2. Provided New Patient Information packet:  Contact information for physician, this navigator, other members of the Care Team  Advance Directive information ([place] blue pamphlet with LCSW insert)  Fall Prevention Patient Safety Plan  Appointment [place] sheet  [place] [facility] map with highlight of [place] 3. Provided and discussed educational handouts for PEG and PAC. Provided information/discussed opportunities for smoking cessation support:    Child psychotherapist class/individual counseling at [employer] [facility] information 4. Of note, transportation a potential barrier.  I discussed SCAT as option, he agreed to consider. 5. I escorted him to [place] following this appt. 6. They verbalized understanding of information provided. I encouraged them to call with questions/concerns, they verbalized understanding.  [name], RN, BSN, [facility] Nurse [place] [facility] [place] [phone]

## 2016-06-02 ENCOUNTER — Telehealth: Payer: Self-pay | Admitting: *Deleted

## 2016-06-02 ENCOUNTER — Encounter: Payer: Self-pay | Admitting: Radiation Oncology

## 2016-06-02 ENCOUNTER — Ambulatory Visit (INDEPENDENT_AMBULATORY_CARE_PROVIDER_SITE_OTHER): Payer: Medicare Other | Admitting: Internal Medicine

## 2016-06-02 ENCOUNTER — Encounter: Payer: Self-pay | Admitting: Internal Medicine

## 2016-06-02 DIAGNOSIS — R000 Tachycardia, unspecified: Secondary | ICD-10-CM

## 2016-06-02 DIAGNOSIS — F17210 Nicotine dependence, cigarettes, uncomplicated: Secondary | ICD-10-CM

## 2016-06-02 DIAGNOSIS — C321 Malignant neoplasm of supraglottis: Secondary | ICD-10-CM | POA: Diagnosis not present

## 2016-06-02 MED ORDER — OXYCODONE-ACETAMINOPHEN 5-500 MG PO CAPS: 1.0000 | ORAL_CAPSULE | ORAL | 0 refills | Status: DC | PRN

## 2016-06-02 MED ORDER — OXYCODONE-ACETAMINOPHEN 5-325 MG PO TABS: 1.0000 | ORAL_TABLET | ORAL | 0 refills | Status: DC | PRN

## 2016-06-02 NOTE — Telephone Encounter (Signed)
Pt ask for nurse to call for prices of pain med, called his chosen [facility], 72.00 then called [facility] 43.00, also had script changed to oxy 5/325 it will be much cheaper, pt is going to [facility].

## 2016-06-02 NOTE — Progress Notes (Signed)
Medicine attending: Medical history, presenting problems, physical findings, and medications, reviewed with resident physician Dr Nathan Boswell on the day of the patient visit and I concur with his evaluation and management plan. 

## 2016-06-02 NOTE — Addendum Note (Signed)
Addended by: [place] Lions on: 06/02/2016 10:49 AM   Modules accepted: Orders

## 2016-06-02 NOTE — Assessment & Plan Note (Addendum)
Antonio [name] was seen in clinic on 05/17/16 with complaints of left sided facial swelling. Found to have a large, firm, immobile, hard mass on his left neck at that time and was admitted from clinic for further evaluation. CT of his neck showed a 2x2 cm left laryngeal mass with associated cervical lymphadenopathy. IR did 3 core needle biopsies of the cervical lymphadenopathy which showed squamous cell carcinoma with tumor invasion into the skeletal muscle. HPV negative. He has since follow up with oncology outpatient on 4/18 and was scheduled for PET CT for staging. He was also scheduled to see a dentist for tooth extraction prior to chemo-radiation and met with a dentist on 4/18. He was set up to be evaluated by an oral surgeon and is scheduled for extraction next week. Scheduled for follow up with oncology on 5/8 following his PET scan results. Patient with a history of both alcohol and tobacco abuse.   He reports that he has been quitting smoking on his own. Reports he has only smoked 2 cigarettes in the past 3-4 days. Does not wish for any help quitting and says he can do it on his own. Also reports he has been cutting back on his alcohol intake and is only drinking 1 beer every other day.   Reports significant pain with swallowing foods. No problems with liquids, though does not cold sensitivity. No dysphagia reported. He denies any difficulties breathing. Says that he has throbbing pain on left side of face that is worse after eating. Reports good appetite. No fevers, chills, nausea, vomiting, diarrhea.   Denies any depression today. Reports he has a good support network with family and friends.   Assessment: squamous cell carcinoma of the supraglottis  Plan: Follow up with oncology, rad-onc, oral surgery.  Encouraged smoking and alcohol cessation Given his cancer pain, believe it is reasonable to prescribe him oxycodone 5-325 mg q4hr prn #50 today until he is able to follow up with oncology.

## 2016-06-02 NOTE — Progress Notes (Signed)
   CC: HFU for newly diagnosed squamous cell carcinoma of the larynx  HPI:  [name] is a 61 y.o. male with a past medical history listed below here today for follow up of his recent hospitalization for neck mass found to be squamous cell carcinoma of the larynx.    For details of today's visit and the status of his chronic medical issues please refer to the assessment and plan.   Past Medical History:  Diagnosis Date  . Allergy    allergic rhinitis  . Arthritis    "qwhere" (05/17/2016)  . Chronic cough   . Chronic sinus infection   . Colonic polyp   . COPD (chronic obstructive pulmonary disease) ([place])   . 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 Dysphagia    normal EGD except for mild gastritis in 01/2006  . GERD (gastroesophageal reflux disease)   . Head and neck malignancy ([place]) 05/18/2016  . High cholesterol   . Hypertension   . Rectal bleeding    colonoscopy in 01/2006  . Rectal polyp    01/2006 with path showing it to be benign, though with a question that it was incomplete biopsy, next scheduled coloscopy 2010  . Sickle cell trait ([place])   . Tobacco abuse   . Varus deformity of knee    bilateral, congenital    Review of Systems:   See HPI  Physical Exam:  Vitals:   06/02/16 1007  BP: (!) 149/75  Pulse: (!) 107  Temp: 97 F (36.1 C)  TempSrc: Oral  SpO2: 97%  Weight: 167 lb 8 oz (76 kg)  Height: '5\' 3"'$  (1.6 m)   Physical Exam  Constitutional: He is well-developed, well-nourished, and in no distress.  Neck:  Large, firm, hard, immobile mass of left neck  Cardiovascular: Regular rhythm and normal heart sounds.  Tachycardia present.   Pulmonary/Chest: Effort normal and breath sounds normal.  Vitals reviewed.    Assessment & Plan:   See Encounters Tab for problem based charting.  Patient discussed with Dr. Beryle Beams

## 2016-06-02 NOTE — Patient Instructions (Signed)
Antonio [name],  I am going to give you some medication today for your pain. Take 1-2 pills as needed every 4 hours. Please follow up with your oncologist.

## 2016-06-04 ENCOUNTER — Telehealth: Payer: Self-pay | Admitting: *Deleted

## 2016-06-04 NOTE — Telephone Encounter (Signed)
A user error has taken place: encounter opened in error, closed for administrative reasons.

## 2016-06-06 ENCOUNTER — Telehealth: Payer: Self-pay | Admitting: Physical Therapy

## 2016-06-06 NOTE — Telephone Encounter (Signed)
Left message to call to schedule PT eval on 2/7 & 03/27/16 no return call

## 2016-06-08 ENCOUNTER — Telehealth: Payer: Self-pay | Admitting: Physical Therapy

## 2016-06-08 ENCOUNTER — Ambulatory Visit (HOSPITAL_COMMUNITY)
Admission: RE | Admit: 2016-06-08 | Discharge: 2016-06-08 | Disposition: A | Discharge status: Home / Self Care | Payer: Medicare Other | Source: Ambulatory Visit | Attending: Hematology and Oncology | Admitting: Hematology and Oncology

## 2016-06-08 DIAGNOSIS — C7989 Secondary malignant neoplasm of other specified sites: Secondary | ICD-10-CM | POA: Diagnosis not present

## 2016-06-08 DIAGNOSIS — I2510 Atherosclerotic heart disease of native coronary artery without angina pectoris: Secondary | ICD-10-CM | POA: Insufficient documentation

## 2016-06-08 DIAGNOSIS — R911 Solitary pulmonary nodule: Secondary | ICD-10-CM | POA: Diagnosis not present

## 2016-06-08 DIAGNOSIS — C329 Malignant neoplasm of larynx, unspecified: Secondary | ICD-10-CM | POA: Diagnosis not present

## 2016-06-08 DIAGNOSIS — I700 Atherosclerosis of aorta: Secondary | ICD-10-CM | POA: Insufficient documentation

## 2016-06-08 LAB — GLUCOSE, CAPILLARY: GLUCOSE-CAPILLARY: 114 mg/dL — AB (ref 65–99)

## 2016-06-08 MED ORDER — FLUDEOXYGLUCOSE F - 18 (FDG) INJECTION
8.5000 | Freq: Once | INTRAVENOUS | Status: AC | PRN
  Administered 2016-06-08: 8.5 via INTRAVENOUS

## 2016-06-08 NOTE — Telephone Encounter (Signed)
PT eval 04/05/16, did not return

## 2016-06-10 ENCOUNTER — Telehealth: Payer: Self-pay | Admitting: Hematology and Oncology

## 2016-06-10 NOTE — Telephone Encounter (Signed)
Spoke with patient re 5/2 f/u at 11:15 am - after appointment in radiation oncology.

## 2016-06-12 ENCOUNTER — Telehealth (HOSPITAL_COMMUNITY): Payer: Self-pay | Admitting: Dentistry

## 2016-06-12 NOTE — Telephone Encounter (Signed)
06/12/2016  Patient:            Antonio [name] Date of Birth:  1955/10/02 MRN:                [id number]   I contacted the patient, Antonio [name], concerning disposition of his dental extraction with oral surgeon, Dr. [name]. Patient had tooth #17 extracted on 06/06/2016. Patient denies having any problems from that dental extraction. Patient indicates that he does not have a follow-up evaluation with Dr. [name]. I then attempted to schedule patient for tomorrow at 10 AM for evaluation of healing. Patient indicated that he does not have transportation to that proposed appointment. I am unavailable for follow-up evaluation on 06/14/2016 and he is seeing Dr. Isidore Moos and [name]. I will attempt to schedule patient as time and space permits later in the week for evaluation of healing. Dr. [name]

## 2016-06-13 NOTE — Progress Notes (Signed)
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 p16 shows only focal very weak staining.  Patient presented months ago with symptoms of: Pain on the left side of the ear with sensation of mild sore throat and mild choking.     Biopsies of Left cervical lymph node revealed: squamous cell carcinoma with very weak P16 staining  Nutrition Status Yes No Comments  Weight changes? '[x]'$  '[]'$  He has lost about 6 lbs over the last month.   Swallowing concerns? '[]'$  '[x]'$  He has modified his diet because of missing teeth. He tells me that he has no difficulty swallowing  PEG? '[]'$  '[x]'$     Referrals Yes No Comments  Social Work? '[]'$  '[x]'$    Dentistry? '[x]'$  '[]'$  Tooth extraction on 06/06/16 by Dr. [name]. To see Dr. [name] for follow up soon.  Swallowing therapy? '[]'$  '[x]'$    Nutrition? '[]'$  '[]'$    Med/Onc? '[x]'$  '[]'$  [name] on 05/31/16 and again on 06/14/16   Safety Issues Yes No Comments  Prior radiation? '[]'$  '[x]'$    Pacemaker/ICD? '[]'$  '[x]'$    Possible current pregnancy? '[]'$  '[x]'$    Is the patient on methotrexate? '[]'$  '[x]'$     Tobacco/Marijuana/Snuff/ETOH use: He is a current smoker but is trying to cut back. He drinks about 6-8 cans of beer weekly. He has a history of using marijuana. He tells me the last time he used crack cocaine was several weeks ago.   Past/Anticipated interventions by otolaryngology, if any: 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. Follow up   Current Complaints / other details:   06/08/16 PET IMPRESSION: 1. 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 2. Hypermetabolic right lower lobe pulmonary nodule is favored to represent a synchronous primary bronchogenic neoplasm. Isolated pulmonary metastasis could look similar. No thoracic nodal hypermetabolism. 3. Soft tissue hypermetabolism about the right iliac bone and greater trochanter of the proximal right femur are favored to be degenerative or posttraumatic. Soft tissue metastasis felt highly unlikely. 4.  Coronary artery atherosclerosis. Aortic atherosclerosis. 5. A tiny right lower lobe pulmonary nodule is most likely a subpleural lymph node.  BP 106/76   Pulse 98   Temp 98.5 F (36.9 C)   Ht '5\' 3"'$  (1.6 m)   Wt 167 lb (75.8 kg)   SpO2 96% Comment: room air  BMI 29.58 kg/m    Wt Readings from Last 3 Encounters:  06/14/16 167 lb (75.8 kg)  06/02/16 167 lb 8 oz (76 kg)  05/31/16 168 lb (76.2 kg)

## 2016-06-14 ENCOUNTER — Telehealth: Payer: Self-pay | Admitting: Hematology and Oncology

## 2016-06-14 ENCOUNTER — Encounter: Payer: Self-pay | Admitting: *Deleted

## 2016-06-14 ENCOUNTER — Encounter: Payer: Self-pay | Admitting: Radiation Oncology

## 2016-06-14 ENCOUNTER — Ambulatory Visit (HOSPITAL_BASED_OUTPATIENT_CLINIC_OR_DEPARTMENT_OTHER): Payer: Medicare Other | Admitting: Hematology and Oncology

## 2016-06-14 ENCOUNTER — Ambulatory Visit
Admission: RE | Admit: 2016-06-14 | Discharge: 2016-06-14 | Disposition: A | Discharge status: Home / Self Care | Payer: Medicare Other | Source: Ambulatory Visit | Attending: Radiation Oncology | Admitting: Radiation Oncology

## 2016-06-14 VITALS — BP 125/68 | HR 88 | Temp 98.4°F | Resp 19 | Ht 63.0 in | Wt 167.6 lb

## 2016-06-14 VITALS — BP 106/76 | HR 98 | Temp 98.5°F | Ht 63.0 in | Wt 167.0 lb

## 2016-06-14 DIAGNOSIS — K219 Gastro-esophageal reflux disease without esophagitis: Secondary | ICD-10-CM | POA: Diagnosis not present

## 2016-06-14 DIAGNOSIS — C321 Malignant neoplasm of supraglottis: Secondary | ICD-10-CM | POA: Insufficient documentation

## 2016-06-14 DIAGNOSIS — Z720 Tobacco use: Secondary | ICD-10-CM | POA: Diagnosis not present

## 2016-06-14 DIAGNOSIS — K5900 Constipation, unspecified: Secondary | ICD-10-CM | POA: Insufficient documentation

## 2016-06-14 DIAGNOSIS — F17210 Nicotine dependence, cigarettes, uncomplicated: Secondary | ICD-10-CM | POA: Diagnosis not present

## 2016-06-14 DIAGNOSIS — F1010 Alcohol abuse, uncomplicated: Secondary | ICD-10-CM

## 2016-06-14 DIAGNOSIS — D573 Sickle-cell trait: Secondary | ICD-10-CM | POA: Diagnosis not present

## 2016-06-14 DIAGNOSIS — C3431 Malignant neoplasm of lower lobe, right bronchus or lung: Secondary | ICD-10-CM | POA: Diagnosis not present

## 2016-06-14 DIAGNOSIS — G893 Neoplasm related pain (acute) (chronic): Secondary | ICD-10-CM

## 2016-06-14 DIAGNOSIS — Z510 Encounter for antineoplastic radiation therapy: Secondary | ICD-10-CM | POA: Insufficient documentation

## 2016-06-14 DIAGNOSIS — R911 Solitary pulmonary nodule: Secondary | ICD-10-CM

## 2016-06-14 DIAGNOSIS — C770 Secondary and unspecified malignant neoplasm of lymph nodes of head, face and neck: Secondary | ICD-10-CM | POA: Diagnosis not present

## 2016-06-14 DIAGNOSIS — E7800 Pure hypercholesterolemia, unspecified: Secondary | ICD-10-CM | POA: Insufficient documentation

## 2016-06-14 DIAGNOSIS — I10 Essential (primary) hypertension: Secondary | ICD-10-CM | POA: Insufficient documentation

## 2016-06-14 DIAGNOSIS — F17200 Nicotine dependence, unspecified, uncomplicated: Secondary | ICD-10-CM

## 2016-06-14 DIAGNOSIS — R918 Other nonspecific abnormal finding of lung field: Secondary | ICD-10-CM | POA: Diagnosis not present

## 2016-06-14 MED ORDER — PROCHLORPERAZINE MALEATE 10 MG PO TABS: 10.0000 mg | ORAL_TABLET | Freq: Four times a day (QID) | ORAL | 1 refills | Status: DC | PRN

## 2016-06-14 MED ORDER — LIDOCAINE-PRILOCAINE 2.5-2.5 % EX CREA: TOPICAL_CREAM | CUTANEOUS | 3 refills | Status: AC

## 2016-06-14 MED ORDER — SENNOSIDES-DOCUSATE SODIUM 8.6-50 MG PO TABS: 1.0000 | ORAL_TABLET | Freq: Two times a day (BID) | ORAL | 3 refills | Status: AC

## 2016-06-14 MED ORDER — MORPHINE SULFATE 15 MG PO TABS: 15.0000 mg | ORAL_TABLET | Freq: Four times a day (QID) | ORAL | 0 refills | Status: DC | PRN

## 2016-06-14 MED ORDER — LARYNGOSCOPY SOLUTION RAD-ONC
15.0000 mL | Freq: Once | TOPICAL | Status: AC
  Administered 2016-06-14: 15 mL via TOPICAL
  Filled 2016-06-14: qty 15

## 2016-06-14 MED ORDER — ONDANSETRON HCL 8 MG PO TABS
8.0000 mg | ORAL_TABLET | Freq: Two times a day (BID) | ORAL | 1 refills | Status: AC | PRN
Start: 2016-06-14 — End: ?

## 2016-06-14 MED FILL — PROCHLORPERAZINE 10 MG TAB: 10 | 7 days supply | Qty: 30 | Fill #0

## 2016-06-14 NOTE — Progress Notes (Signed)
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 I would be surprised if this patient died in the next year  Intent of Therapy: Non-Curative / Palliative Intent, Discussed with Patient

## 2016-06-14 NOTE — Progress Notes (Signed)
Has armband been applied?  Yes  Does patient have an allergy to IV contrast dye?: No   Has patient ever received premedication for IV contrast dye?: N/A  Does patient take metformin?: No  If patient does take metformin when was the last dose: N/A  Date of lab work: 05/18/16 BUN: 11 CR: 0.93  IV site: Right antecubital  Has IV site been added to flowsheet? Yes  BP 122/87   Pulse 94   Temp 98.3 F (36.8 C)   Ht '5\' 3"'$  (1.6 m)   Wt 165 lb (74.8 kg)   BMI 29.23 kg/m

## 2016-06-14 NOTE — Progress Notes (Signed)
Radiation Oncology         [phone] ________________________________  Initial outpatient  Consultation  Name: Antonio [name] MRN: [id number]  Date: 06/14/2016  DOB: [birth year]  NO:[id number] [name], MD  [name], MD   REFERRING PHYSICIAN: [name], MD  DIAGNOSIS:    ICD-9-CM ICD-10-CM   1. 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 male who presented to Dr. [name], an internal medicine resident, on 05/17/16 for facial tingling and left facial swelling since March 2018. The swelling has caused difficulty with swallowing and pain. Physical exam at the time revealed erythema of the left external ear canal, significant left facial edema along jaw line, and a large immobile firm nodule underneath left jaw that is tender to palpation and warm to palpation. Dr. [name], the attending physician, additionally noted a left sided cervical mass below the patient's jaw linewhich was hard on palpation and immobile. The patient was subsequently admitted to the hospital for further workup of malignancy vs infection. The patient was started on empiric IV clindamycin and CT scan of the neck at that time showed a left level IIA to level III 4.4.7 x 4.6 x 7.6 cm nodal conglomeration with central necrosis, additional small necrotic satellite lymph nodes, a hazy density along the inferior margin of the nodal conglomeration concerning for subcapsular spread of  disease, a 1.3 cm left level IV lymph node, and a left hypopharyngeal/piriform sinus mucosal to submucosal 1.9 x 1.9 cm mass invading the left supraglottic fat, left glossopharyngeal fold, and left aryepiglottic fold.  [name], ENT, was consulted and laryngoscopy the night of 05/17/16 revealed thickening of the epiglottis and aryepiglottic fold on the left with no gross tumor erosion and a mildly compressed airway. US guided biopsy of a left cervical lymph node on 05/18/16 revealed squamous cell carcinoma with the tumor invading the skeletal muscle. p16 testing showed focal very weak staining. The patient was discharged on 05/19/16.  The patient consulted with [name] of medical oncology on 05/31/16 who discussed chemoradiation and his case was discussed in tumor board that morning. PET scan on 06/08/16 showed a supraglottic laryngeal primary with left greater than right cervical and left supraclavicular nodal metastasis and a hypermetabolic right lower lobe pulmonary nodule favored to be a second primary. His PET was reviewed by me personally showing bilateral neck adenopathy, a large tumor wrapping around his larynx, and a right lower lung nodule which is favored to be a second primary.  The patient saw Dr. [name] on 05/31/16. The patient had multiple teeth extracted in 2008 with subsequent fabrication of upper and lower complete dentures by Dr. [name]. His only tooth is #17 remaining as an impacted wisdom tooth. The patient then proceeded for removal of this tooth by Dr. [name] on 06/06/16.  The patient's sister and [name], RN, our Head and Neck Oncology Navigator were present during this encounter.  Swallowing issues, if any: He has modified his diet because  of missing teeth. He reports no difficulty swallowing at this time.  Weight Changes: The patient has lost about 6 lbs over the past month.  Pain status: Left sided neck pain to palpation.  Other symptoms: Left sided face and neck  swelling.  Tobacco history, if any: He is a current smoking and is trying to cut back. He smokes about 2-3 cigarettes a day now. He has a history of marijuana use. The last time he used crack cocaine was several weeks ago.  ETOH abuse, if any: He drinks about 6-8 cans of beer a week.  Prior cancers, if any: None  PREVIOUS RADIATION THERAPY: No  PAST MEDICAL HISTORY:  has a past medical history of Allergy; Arthritis; Chronic cough; Chronic sinus infection; Colonic polyp; COPD (chronic obstructive pulmonary disease) (HCC); DJD (degenerative joint disease) of knee; Dysphagia; GERD (gastroesophageal reflux disease); Head and neck malignancy ([place]) (05/18/2016); High cholesterol; Hypertension; Rectal bleeding; Rectal polyp; Sickle cell trait ([place]); Tobacco abuse; and Varus deformity of knee.    PAST SURGICAL HISTORY: Past Surgical History:  Procedure Laterality Date  . COLONOSCOPY W/ BIOPSIES AND POLYPECTOMY  X 2  . MULTIPLE TOOTH EXTRACTIONS  ~ 2008   "took them all out"    FAMILY HISTORY: family history includes CAD in his sister; Cancer in his mother; Diabetes in his father and sister; Hypertension in his father.  SOCIAL HISTORY:  reports that he has been smoking Cigarettes.  He has a 5.16 pack-year smoking history. He has never used smokeless tobacco. He reports that he drinks about 14.4 oz of alcohol per week . He reports that he uses drugs, including "Crack" cocaine and Marijuana.  ALLERGIES: Patient has no known allergies.  MEDICATIONS:  Current Outpatient Prescriptions  Medication Sig Dispense Refill  . atorvastatin (LIPITOR) 40 MG tablet Take 1 tablet (40 mg total) by mouth daily. 30 tablet 2  . cyclobenzaprine (FLEXERIL) 10 MG tablet Take 1 tablet (10 mg total) by mouth at bedtime. 30 tablet 1  . gabapentin (NEURONTIN) 300 MG capsule Take 1 capsule (300 mg total) by mouth 3 (three) times daily. 90 capsule 3  . hydrochlorothiazide (HYDRODIURIL) 25 MG tablet TAKE ONE TABLET BY MOUTH  ONCE DAILY 90 tablet 3  . lisinopril (PRINIVIL,ZESTRIL) 20 MG tablet TAKE ONE TABLET BY MOUTH ONCE DAILY 90 tablet 2  . loratadine (CLARITIN) 10 MG tablet TAKE ONE TABLET BY MOUTH ONCE DAILY 90 tablet 3  . diclofenac sodium (VOLTAREN) 1 % GEL Apply 2 g topically 4 (four) times daily. (Patient not taking: Reported on 05/17/2016) 1 Tube 2  . lidocaine-prilocaine (EMLA) cream Apply to affected area once 30 g 3  . morphine (MSIR) 15 MG tablet Take 1 tablet (15 mg total) by mouth every 6 (six) hours as needed for severe pain. 60 tablet 0  . ondansetron (ZOFRAN) 8 MG tablet Take 1 tablet (8 mg total) by mouth 2 (two) times daily as needed. Start on the third day after chemotherapy. 30 tablet 1  . prochlorperazine (COMPAZINE) 10 MG tablet Take 1 tablet (10 mg total) by mouth every 6 (six) hours as needed (Nausea or vomiting). 30 tablet 1  . senna-docusate (SENOKOT-S) 8.6-50 MG tablet Take 1 tablet by mouth 2 (two) times daily. 60 tablet 3   No current facility-administered medications for this encounter.     REVIEW OF SYSTEMS:  A 10+ POINT REVIEW OF SYSTEMS WAS OBTAINED including neurology, dermatology, psychiatry, cardiac, respiratory, lymph, extremities, GI, GU, Musculoskeletal, constitutional, breasts, reproductive, HEENT.  All pertinent  positives are noted in the HPI.  All others are negative.    PHYSICAL EXAM:  height is '5\' 3"'$  (1.6 m) and weight is 167 lb (75.8 kg). His temperature is 98.5 F (36.9 C). His blood pressure is 106/76 and his pulse is 98. His oxygen saturation is 96%.   General: Alert and oriented, in no acute distress HEENT: Head is normocephalic. Extraocular movements are intact. (+) Oropharynx is clear, edentulous status post extractions, appears to be healing well, mucous membranes moist, tongue deviates to the left, some atrophy of the left lateral tongue. Neck: Neck is notable for no obvious adenopathy of the right neck. (+) Left neck adenopathy palpable from level II to III,  entire mass is about 6 cm in greatest dimension, tender to palpation. Heart: Regular in rate and rhythm with no murmurs, rubs, or gallops. Chest: Clear to auscultation bilaterally, with no rhonchi, wheezes, or rales. Abdomen: Soft, nontender, nondistended, with no rigidity or guarding. Extremities: No cyanosis or edema. Lymphatics: see Neck Exam Skin: No concerning lesions. Intact over the neck. Musculoskeletal: symmetric strength and muscle tone throughout. Neurologic: Cranial nerves II through XII are grossly intact. No obvious focalities. Speech is fluent. Coordination is intact. Psychiatric: Judgment and insight are intact. Affect is appropriate.  PROCEDURE NOTE: After obtaining consent and anesthetizing the nasal cavity with topical lidocaine and phenylephrine, the flexible endoscope was introduced and passed through the nasal cavity.   Edema in his supraglottic region. The left aryepiglottic fold is swollen and the left supraglottic structures appear not to be moving properly. Airway patent. Difficult for me to see his true cords due to his anatomy, not appreciating any exophytic tumor. He does appear to have a submucosal process with tumor causing swelling, particularly in the left supraglottis.  ECOG = 2  0 - Asymptomatic (Fully active, able to carry on all predisease activities without restriction)  1 - Symptomatic but completely ambulatory (Restricted in physically strenuous activity but ambulatory and able to carry out work of a light or sedentary nature. For example, light housework, office work)  2 - Symptomatic, <50% in bed during the day (Ambulatory and capable of all self care but unable to carry out any work activities. Up and about more than 50% of waking hours)  3 - Symptomatic, >50% in bed, but not bedbound (Capable of only limited self-care, confined to bed or chair 50% or more of waking hours)  4 - Bedbound (Completely disabled. Cannot carry on any self-care. Totally  confined to bed or chair)  5 - Death   Eustace Pen MM, Creech RH, Tormey DC, et al. [id number]). "Toxicity and response criteria of the [facility]". [place] Oncol. 5 (6): 649-55   LABORATORY DATA:  Lab Results  Component Value Date   WBC 7.2 05/18/2016   HGB 13.3 05/18/2016   HCT 39.1 05/18/2016   MCV 82.5 05/18/2016   PLT 349 05/18/2016   CMP     Component Value Date/Time   NA 135 05/18/2016 0359   NA 141 08/25/2015 1445   K 3.9 05/18/2016 0359   CL 98 (L) 05/18/2016 0359   CO2 27 05/18/2016 0359   GLUCOSE 85 05/18/2016 0359   BUN 11 05/18/2016 0359   BUN 7 08/25/2015 1445   CREATININE 0.93 05/18/2016 0359   CREATININE 0.86 02/04/2013 1129   CALCIUM 9.0 05/18/2016 0359   PROT 7.5 05/17/2016 1655   PROT 7.2 11/24/2014 1432   ALBUMIN 4.4 05/17/2016 1655   ALBUMIN 4.8 11/24/2014  1432   AST 26 05/17/2016 1655   ALT 16 (L) 05/17/2016 1655   ALKPHOS 72 05/17/2016 1655   BILITOT 1.0 05/17/2016 1655   BILITOT 0.5 11/24/2014 1432   GFRNONAA >60 05/18/2016 0359   GFRAA >60 05/18/2016 0359    No results found for: TSH     RADIOGRAPHY: Dg Orthopantogram  Result Date: 05/18/2016 CLINICAL DATA:  Reason for exam: laryngeal cancer EXAM: ORTHOPANTOGRAM/PANORAMIC COMPARISON:  None. FINDINGS: There is a single mostly unerupted left mandibular third molar. Patient is otherwise edentulous. No fractures. No osteoblastic or osteolytic lesions. Temporomandibular joints are normally aligned. IMPRESSION: 1. No evidence of mandibular metastatic disease or fracture. Electronically Signed   By: Lajean Manes M.D.   On: 05/18/2016 21:43   Ct Soft Tissue Neck W Contrast  Addendum Date: 05/17/2016   ADDENDUM REPORT: 05/17/2016 20:56 ADDENDUM: Acute findings discussed with and reconfirmed by Dr.Wolicki, ENT on 04/18/91 at 8:54 pm. Acute findings discussed with and reconfirmed by Dr.Shorr on 05/17/2016 at 8:56 pm. Electronically Signed   By: Elon Alas M.D.   On: 05/17/2016  20:56   Result Date: 05/17/2016 CLINICAL DATA:  Facial tingling and LEFT facial swelling since March 13th. Worsening pain. History of chronic sinus infection, tobacco abuse. EXAM: CT NECK WITH CONTRAST TECHNIQUE: Multidetector CT imaging of the neck was performed using the standard protocol following the bolus administration of intravenous contrast. CONTRAST:  75 cc ISOVUE-300 IOPAMIDOL (ISOVUE-300) INJECTION 61% COMPARISON:  CT HEAD February 08, 2003 FINDINGS: PHARYNX AND LARYNX: LEFT hypopharyngeal/ piriform sinus mucosal to submucosal 19 x 19 mm mass invading the LEFT supraglottic fat, LEFT glossopharyngeal fold, LEFT aryepiglottic fold. Moderately narrowed supraglottic airway. No definite strap muscle or thyroid cartilage invasion. Moderately narrowed hypopharynx. SALIVARY GLANDS: Normal. THYROID: Normal. LYMPH NODES: LEFT level IIa 2 level 3 4.7 x 4.6 x 7.6 cm (transverse by AP view 8 cc) nodal conglomeration with central necrosis. Additional small necrotic satellite lymph nodes. Hazy density along the inferior margin of the nodal conglomeration concerning for subcapsular spread of disease. 13 mm round LEFT level level 4 lymph node. VASCULAR: Mild calcific atherosclerosis. Moderate calcific atherosclerosis of the carotid bifurcations. LIMITED INTRACRANIAL: Normal. VISUALIZED ORBITS: Normal. MASTOIDS AND VISUALIZED PARANASAL SINUSES: Well-aerated. SKELETON: Nonacute.  Patient is edentulous. UPPER CHEST: Lung apices are clear. No superior mediastinal lymphadenopathy. OTHER: None. 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 LEFT neck lymphadenopathy including LEFT level IIa to level 3 4.7 x 4.6 x 7.6 cm necrotic nodal conglomeration with suspected extracapsular spread of disease. Moderately narrowed hypopharynx. Electronically Signed: By: Elon Alas M.D. On: 05/17/2016 20:43   Nm Pet Image Initial (pi)  Skull Base To Thigh  Result Date: 06/08/2016 CLINICAL DATA:  Initial treatment strategy for squamous cell carcinoma of larynx. EXAM: NUCLEAR MEDICINE PET SKULL BASE TO THIGH TECHNIQUE: 114 mCi F-18 FDG was injected intravenously. Full-ring PET imaging was performed from the skull base to thigh after the radiotracer. CT data was obtained and used for attenuation correction and anatomic localization. FASTING BLOOD GLUCOSE:  Value: 8.5 mg/dl COMPARISON:  Neck CT 05/17/2016 FINDINGS: NECK Hypermetabolism corresponding to the left supraglottic laryngeal mass. This measures a S.U.V. max of 20.6, including on image 33/series 4. Probable extension across the midline, with early focus of hypermetabolism in the region of the right posterior larynx measuring a S.U.V. max of 7.1, including on approximately image 37/series 4. The dominant left level 2-3 nodal mass demonstrates hypermetabolism  and central necrosis. This measures a S.U.V. max of 16.6 on image 27/series 4. There are small but hypermetabolic right-sided level 2 nodes, including at 7 mm and a S.U.V. max of 9.5 on image 34/series 4. Left supraclavicular hypermetabolic adenopathy, with an index node measuring 9 mm and a S.U.V. max of 9.0 on image 43/series 4. Other neck findings deferred to prior diagnostic CT. Mass-effect upon the airway persists. CHEST No other thoracic nodal hypermetabolism. A spiculated right lower lobe pulmonary nodule measures 1.9 x 1.8 cm and a S.U.V. max of 8.1 on image 43/series 8. 2 mm subpleural right lower lobe nodule on image 52/series 8. Mild cardiomegaly. Anterior pericardial thickening is not hypermetabolic. Coronary artery atherosclerosis. ABDOMEN/PELVIS No abdominopelvic nodal or parenchymal hypermetabolism identified. Normal adrenal glands. A subtle pericholecystic right liver lobe 13 mm lesion is suspected on image 105/series 4. Not hypermetabolic. Abdominal aortic atherosclerosis. Mild prostatomegaly. SKELETON Focus of  hypermetabolism about the anterior right iliac bone is felt to correspond to degenerative osseous irregularity. This measures a S.U.V. max of 4.0 including on image 157/series 4. A focus of soft tissue hypermetabolism adjacent the greater trochanter of the right femur is without osseous or soft tissue correlate. Degenerate change of both glenohumeral joints. IMPRESSION: 1. 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 2. Hypermetabolic right lower lobe pulmonary nodule is favored to represent a synchronous primary bronchogenic neoplasm. Isolated pulmonary metastasis could look similar. No thoracic nodal hypermetabolism. 3. Soft tissue hypermetabolism about the right iliac bone and greater trochanter of the proximal right femur are favored to be degenerative or posttraumatic. Soft tissue metastasis felt highly unlikely. 4.  Coronary artery atherosclerosis. Aortic atherosclerosis. 5. A tiny right lower lobe pulmonary nodule is most likely a subpleural lymph node. Electronically Signed   By: Abigail Miyamoto M.D.   On: 06/08/2016 16:51   US Biopsy  Result Date: 05/18/2016 CLINICAL DATA:  Supraglottic neoplasm. Confluent left cervical adenopathy. EXAM: ULTRASOUND GUIDED CORE BIOPSY OF LEFT CERVICAL ADENOPATHY MEDICATIONS: Intravenous Fentanyl and Versed were administered as conscious sedation during continuous monitoring of the patient's level of consciousness and physiological / cardiorespiratory status by the radiology RN, with a total moderate sedation time of 10 minutes. PROCEDURE: The procedure, risks, benefits, and alternatives were explained to the patient. Questions regarding the procedure were encouraged and answered. The patient understands and consents to the procedure. Survey ultrasound of the left cervical region performed. Adenopathy was localized and an appropriate skin entry site was determined and marked. The operative field was prepped  with chlorhexidine in a sterile fashion, and a sterile drape was applied covering the operative field. A sterile gown and sterile gloves were used for the procedure. Local anesthesia was provided with 1% Lidocaine. Under real-time ultrasound guidance, a 17 gauge trocar needle was advanced to the margin of the lesion. Once needle tip position was confirmed, coaxial 18-gauge core biopsy samples were obtained, submitted in saline to surgical pathology. The guide needle was removed. Postprocedure scans show no hemorrhage or other apparent complication. The patient tolerated the procedure well. COMPLICATIONS: None. FINDINGS: Confluent left cervical adenopathy again localized. Representative core biopsy samples obtained as above. IMPRESSION: 1. Technically successful ultrasound-guided core biopsy of left cervical adenopathy. Electronically Signed   By: Lucrezia Europe M.D.   On: 05/18/2016 14:28      IMPRESSION/PLAN: 61 y.o. 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  We discussed options of comfort measures only vs radiotherapy alone vs chemotherapy alone vs ChRT.  I do recommend radiotherapy for this patient if he wants to attempt cure (he understands that cure will be difficult given the extent of his disease and possible metastasis vs second primary)  and he'll see [name] to discuss chemotherapy. He seems to favor an aggressive approach. We discussed possible port a cath and PEG placement.  We discussed the potential risks, benefits, and side effects of 7 weeks of head /neck radiotherapy. We talked in detail about acute and late effects. We discussed that some of the most bothersome acute effects may be mucositis, dysgeusia, salivary changes, skin irritation, hair loss, dehydration, weight loss and fatigue. We talked about late effects which include but are not necessarily limited to dysphagia,  hypothyroidism, nerve injury, spinal cord injury, xerostomia, trismus, and neck edema. No guarantees of treatment were given. A consent form was signed and placed in the patient's medical record. The patient is enthusiastic about proceeding with treatment. I look forward to participating in the patient's care.  We discussed SBRT to the lung over 1-2 weeks, which has risks including injury to normal tissues in the thorax.   Simulation (treatment planning) will take place  Friday May 4 at 1PM.   We also discussed that the treatment of head and neck cancer is a multidisciplinary process to maximize treatment outcomes and quality of life. For this reasons the following referrals have been or will be made:  Medical oncology to discuss chemotherapy. The patient initally consulted with [name] on 05/31/16 and he is scheduled to see her again later today.  Dentistry for dental evaluation, possible extractions in the radiation fields, and /or advice on reducing risk of cavities, osteoradionecrosis, or other oral issues. The patient initially saw Dr. [name] on 05/31/16 and had a tooth extraction by Dr. [name] on 06/06/16. He will follow up with the patient soon.  Nutritionist for nutrition support during and after treatment. The patient is scheduled to consult with Ernestene Kiel, RD on 06/20/16.  Speech language pathology for swallowing and/or speech therapy. He is scheduled to see Garald Balding, SLP, on 06/20/16.  Social work for social support. History of drug (cocaine) abuse.  I asked the patient today about tobacco use. The patient uses tobacco.  I advised the patient to quit.  Social work can help with this further, given his complex history.   Physical therapy due to risk of lymphedema in neck and deconditioning. Baseline labs including TSH.  We also discussed the patient's likely has a synchronous primary bronchogenic neoplasm in the right lower lobe noted on the PET/CT from 06/08/16. It measured 1.9 x  1.8 cm with an SUV max of 8.1. This nodule has not been biopsied.  We discussed that he also likely has stage I lung cancer given his tobacco history. We discussed SBRT to this area as above. We discussed 3- 5 treatments occurring every other day as an outpatient. We discussed that SBRT was unlikely to make his breathing any worse. It is also unlikely to make his breathing symptoms any better. The patient also signed a consent form to undergo SBRT to this RLL nodule and a copy was placed in his medical chart.  CT simulation for SBRT will occur after the more urgent simulation for his head and neck region.  __________________________________________   [name], MD  This document serves as a record of services personally performed by Judson Roch  Isidore Moos, MD. It was created on her behalf by Darcus Austin, a trained medical scribe. The creation of this record is based on the scribe's personal observations and the provider's statements to them. This document has been checked and approved by the attending provider.

## 2016-06-14 NOTE — Progress Notes (Signed)
[place] Psychosocial Distress Screening Clinical Social Work  Clinical Social Work was referred by distress screening protocol.  The patient scored a 10 on the Psychosocial Distress Thermometer which indicates severe distress. Clinical Social Worker attempted to contact patient by phone to assess for distress and other psychosocial needs. CSW left voicemail for patient to return call.  ONCBCN DISTRESS SCREENING 06/14/2016  Screening Type Initial Screening  Distress experienced in past week (1-10) 10  Practical problem type Transportation  Emotional problem type Nervousness/Anxiety;Adjusting to illness;Isolation/feeling alone;Adjusting to appearance changes  Information Concerns Type Lack of info about diagnosis;Lack of info about treatment;Lack of info about complementary therapy choices  Physical Problem type Pain;Talking;Skin dry/itchy  Physician notified of physical symptoms Yes   Lauren Alver Sorrow, MSW, LCSW, OSW-C Clinical Social Worker [place] [phone]

## 2016-06-14 NOTE — Telephone Encounter (Signed)
Gave patient AVS and calender per 5/2 los. Unable to scheduled f/u on 5/16 due to availability forDr. [name] will call and confirm with patient when appt is scheduled.

## 2016-06-15 ENCOUNTER — Encounter: Payer: Self-pay | Admitting: Hematology and Oncology

## 2016-06-15 ENCOUNTER — Telehealth (HOSPITAL_COMMUNITY): Payer: Self-pay | Admitting: Dentistry

## 2016-06-15 ENCOUNTER — Encounter: Payer: Self-pay | Admitting: *Deleted

## 2016-06-15 MED FILL — MORPHINE SULFATE IR 15 MG T: 15 | 15 days supply | Qty: 60 | Fill #0

## 2016-06-15 NOTE — Assessment & Plan Note (Signed)
I spent some time counseling the patient the importance of tobacco cessation. We discussed common strategies including nicotine patches, Tobacco Quit-line, and other nicotine replacement products to assist in hiseffort to quit  he appears motivated to quit on his own  

## 2016-06-15 NOTE — Assessment & Plan Note (Addendum)
He has extensive history of alcohol abuse I recommend he quit within the next 2 weeks before starting treatment

## 2016-06-15 NOTE — Assessment & Plan Note (Signed)
His case is reviewed at the ENT tumor board I reviewed the imaging study with the patient He has a lung nodule in the right lower lobe, indeterminate as to the cause whether this could be due to metastatic supraglottic cancer versus primary lung cancer Due to his young age, we discussed the aggressive approach of concurrent chemoradiation therapy with the remote chance that he can be cured I am not optimistic about his long-term prognosis I reinforced the importance of nicotine cessation and for him to stop drinking immediately We will arrange for port & feeding tube placement, chemo education class, blood work with plan to start his chemotherapy soon. We discussed the role of chemotherapy. The intent is for curative intent, although his likelihood of long term cure is remote.  We discussed some of the risks, benefits, side-effects of cisplatin  Some of the short term side-effects included, though not limited to, including weight loss, life threatening infections, risk of allergic reactions, need for transfusions of blood products, nausea, vomiting, change in bowel habits, loss of hair, admission to hospital for various reasons, and risks of death.   Long term side-effects are also discussed including risks of infertility, permanent damage to nerve function, hearing loss, chronic fatigue, kidney damage with possibility needing hemodialysis, and rare secondary malignancy including bone marrow disorders.  The patient is aware that the response rates discussed earlier is not guaranteed.  After a long discussion, patient made an informed decision to proceed with the prescribed plan of care.   Patient education material was dispensed.

## 2016-06-15 NOTE — Progress Notes (Signed)
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 LEFT neck lymphadenopathy including LEFT level IIa to level 3 4.7 x 4.6 x 7.6 cm necrotic nodal conglomeration with suspected extracapsular spread of disease. Moderately narrowed hypopharynx.       05/18/2016 Procedure    Technically successful ultrasound-guided core biopsy of left cervical adenopathy.       05/18/2016 Pathology Results    Lymph node, needle/core biopsy, L cerv LAN - SQUAMOUS CELL CARCINOMA, SEE COMMENT. Microscopic Comment Residual lymph node is not seen. There is tumor invading skeletal muscle. p16 weakly positive      06/06/2016 Procedure    He had dental extraction      06/08/2016 PET scan    1. 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 2. Hypermetabolic right lower lobe pulmonary nodule is favored to represent a synchronous primary bronchogenic neoplasm. Isolated pulmonary metastasis could look similar. No thoracic nodal hypermetabolism. 3. Soft tissue hypermetabolism about the right iliac bone and greater trochanter of the proximal right femur are favored to be degenerative or posttraumatic. Soft tissue metastasis felt highly unlikely. 4.  Coronary artery atherosclerosis. Aortic  atherosclerosis. 5. A tiny right lower lobe pulmonary nodule is most likely a subpleural lymph node.       INTERVAL HISTORY: Please see below for problem oriented charting. He is here by himself to discuss results of PET CT scan and chemotherapy consent. He complained of severe pain in his neck He is attempting to quit smoking and drinking  REVIEW OF SYSTEMS:   Constitutional: Denies fevers, chills or abnormal weight loss Eyes: Denies blurriness of vision Ears, nose, mouth, throat, and face: Denies mucositis or sore throat Respiratory: Denies cough, dyspnea or wheezes Cardiovascular: Denies palpitation, chest discomfort or lower extremity swelling Gastrointestinal:  Denies nausea, heartburn or change in bowel habits Skin: Denies abnormal skin rashes Lymphatics: Denies new lymphadenopathy or easy bruising Neurological:Denies numbness, tingling or new weaknesses Behavioral/Psych: Mood is stable, no new changes  All other systems were reviewed with the patient and are negative.  I have reviewed the past medical history, past surgical history, social history and family history with the patient and they are unchanged from previous note.  ALLERGIES:  has No Known Allergies.  MEDICATIONS:  Current Outpatient Prescriptions  Medication Sig Dispense Refill  . atorvastatin (LIPITOR) 40 MG tablet Take 1 tablet (40 mg total) by mouth daily. 30 tablet 2  . cyclobenzaprine (FLEXERIL) 10 MG tablet Take 1 tablet (10 mg total) by mouth at bedtime. 30 tablet 1  . gabapentin (NEURONTIN) 300 MG capsule Take 1 capsule (300 mg total) by mouth 3 (three) times daily. 90 capsule 3  . hydrochlorothiazide (HYDRODIURIL) 25 MG tablet TAKE ONE TABLET BY MOUTH ONCE DAILY 90 tablet 3  . lisinopril (PRINIVIL,ZESTRIL) 20 MG tablet TAKE ONE TABLET BY MOUTH ONCE DAILY 90 tablet  2  . loratadine (CLARITIN) 10 MG tablet TAKE ONE TABLET BY MOUTH ONCE DAILY 90 tablet 3  . diclofenac sodium (VOLTAREN) 1 % GEL Apply 2 g  topically 4 (four) times daily. (Patient not taking: Reported on 05/17/2016) 1 Tube 2  . lidocaine-prilocaine (EMLA) cream Apply to affected area once 30 g 3  . morphine (MSIR) 15 MG tablet Take 1 tablet (15 mg total) by mouth every 6 (six) hours as needed for severe pain. 60 tablet 0  . ondansetron (ZOFRAN) 8 MG tablet Take 1 tablet (8 mg total) by mouth 2 (two) times daily as needed. Start on the third day after chemotherapy. 30 tablet 1  . prochlorperazine (COMPAZINE) 10 MG tablet Take 1 tablet (10 mg total) by mouth every 6 (six) hours as needed (Nausea or vomiting). 30 tablet 1  . senna-docusate (SENOKOT-S) 8.6-50 MG tablet Take 1 tablet by mouth 2 (two) times daily. 60 tablet 3   No current facility-administered medications for this visit.     PHYSICAL EXAMINATION: ECOG PERFORMANCE STATUS: 1 - Symptomatic but completely ambulatory  Vitals:   06/14/16 1127  BP: 125/68  Pulse: 88  Resp: 19  Temp: 98.4 F (36.9 C)   Filed Weights   06/14/16 1127  Weight: 167 lb 9.6 oz (76 kg)    GENERAL:alert, no distress and comfortable SKIN: skin color, texture, turgor are normal, no rashes or significant lesions EYES: normal, Conjunctiva are pink and non-injected, sclera clear Musculoskeletal:no cyanosis of digits and no clubbing  NEURO: alert & oriented x 3 with fluent speech, no focal motor/sensory deficits  LABORATORY DATA:  I have reviewed the data as listed    Component Value Date/Time   NA 135 05/18/2016 0359   NA 141 08/25/2015 1445   K 3.9 05/18/2016 0359   CL 98 (L) 05/18/2016 0359   CO2 27 05/18/2016 0359   GLUCOSE 85 05/18/2016 0359   BUN 11 05/18/2016 0359   BUN 7 08/25/2015 1445   CREATININE 0.93 05/18/2016 0359   CREATININE 0.86 02/04/2013 1129   CALCIUM 9.0 05/18/2016 0359   PROT 7.5 05/17/2016 1655   PROT 7.2 11/24/2014 1432   ALBUMIN 4.4 05/17/2016 1655   ALBUMIN 4.8 11/24/2014 1432   AST 26 05/17/2016 1655   ALT 16 (L) 05/17/2016 1655   ALKPHOS 72 05/17/2016  1655   BILITOT 1.0 05/17/2016 1655   BILITOT 0.5 11/24/2014 1432   GFRNONAA >60 05/18/2016 0359   GFRAA >60 05/18/2016 0359    No results found for: SPEP, UPEP  Lab Results  Component Value Date   WBC 7.2 05/18/2016   HGB 13.3 05/18/2016   HCT 39.1 05/18/2016   MCV 82.5 05/18/2016   PLT 349 05/18/2016      Chemistry      Component Value Date/Time   NA 135 05/18/2016 0359   NA 141 08/25/2015 1445   K 3.9 05/18/2016 0359   CL 98 (L) 05/18/2016 0359   CO2 27 05/18/2016 0359   BUN 11 05/18/2016 0359   BUN 7 08/25/2015 1445   CREATININE 0.93 05/18/2016 0359   CREATININE 0.86 02/04/2013 1129      Component Value Date/Time   CALCIUM 9.0 05/18/2016 0359   ALKPHOS 72 05/17/2016 1655   AST 26 05/17/2016 1655   ALT 16 (L) 05/17/2016 1655   BILITOT 1.0 05/17/2016 1655   BILITOT 0.5 11/24/2014 1432       RADIOGRAPHIC STUDIES: I have personally reviewed the radiological images as listed and agreed with the  findings in the report. Dg Orthopantogram  Result Date: 05/18/2016 CLINICAL DATA:  Reason for exam: laryngeal cancer EXAM: ORTHOPANTOGRAM/PANORAMIC COMPARISON:  None. FINDINGS: There is a single mostly unerupted left mandibular third molar. Patient is otherwise edentulous. No fractures. No osteoblastic or osteolytic lesions. Temporomandibular joints are normally aligned. IMPRESSION: 1. No evidence of mandibular metastatic disease or fracture. Electronically Signed   By: Lajean Manes M.D.   On: 05/18/2016 21:43   Ct Soft Tissue Neck W Contrast  Addendum Date: 05/17/2016   ADDENDUM REPORT: 05/17/2016 20:56 ADDENDUM: Acute findings discussed with and reconfirmed by Dr.Wolicki, ENT on 06/18/2128 at 8:54 pm. Acute findings discussed with and reconfirmed by Dr.Shorr on 05/17/2016 at 8:56 pm. Electronically Signed   By: Elon Alas M.D.   On: 05/17/2016 20:56   Result Date: 05/17/2016 CLINICAL DATA:  Facial tingling and LEFT facial swelling since March 13th. Worsening pain. History  of chronic sinus infection, tobacco abuse. EXAM: CT NECK WITH CONTRAST TECHNIQUE: Multidetector CT imaging of the neck was performed using the standard protocol following the bolus administration of intravenous contrast. CONTRAST:  75 cc ISOVUE-300 IOPAMIDOL (ISOVUE-300) INJECTION 61% COMPARISON:  CT HEAD February 08, 2003 FINDINGS: PHARYNX AND LARYNX: LEFT hypopharyngeal/ piriform sinus mucosal to submucosal 19 x 19 mm mass invading the LEFT supraglottic fat, LEFT glossopharyngeal fold, LEFT aryepiglottic fold. Moderately narrowed supraglottic airway. No definite strap muscle or thyroid cartilage invasion. Moderately narrowed hypopharynx. SALIVARY GLANDS: Normal. THYROID: Normal. LYMPH NODES: LEFT level IIa 2 level 3 4.7 x 4.6 x 7.6 cm (transverse by AP view 8 cc) nodal conglomeration with central necrosis. Additional small necrotic satellite lymph nodes. Hazy density along the inferior margin of the nodal conglomeration concerning for subcapsular spread of disease. 13 mm round LEFT level level 4 lymph node. VASCULAR: Mild calcific atherosclerosis. Moderate calcific atherosclerosis of the carotid bifurcations. LIMITED INTRACRANIAL: Normal. VISUALIZED ORBITS: Normal. MASTOIDS AND VISUALIZED PARANASAL SINUSES: Well-aerated. SKELETON: Nonacute.  Patient is edentulous. UPPER CHEST: Lung apices are clear. No superior mediastinal lymphadenopathy. OTHER: None. 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 LEFT neck lymphadenopathy including LEFT level IIa to level 3 4.7 x 4.6 x 7.6 cm necrotic nodal conglomeration with suspected extracapsular spread of disease. Moderately narrowed hypopharynx. Electronically Signed: By: Elon Alas M.D. On: 05/17/2016 20:43   Nm Pet Image Initial (pi) Skull Base To Thigh  Result Date: 06/08/2016 CLINICAL DATA:  Initial treatment strategy for squamous cell carcinoma of larynx. EXAM:  NUCLEAR MEDICINE PET SKULL BASE TO THIGH TECHNIQUE: 114 mCi F-18 FDG was injected intravenously. Full-ring PET imaging was performed from the skull base to thigh after the radiotracer. CT data was obtained and used for attenuation correction and anatomic localization. FASTING BLOOD GLUCOSE:  Value: 8.5 mg/dl COMPARISON:  Neck CT 05/17/2016 FINDINGS: NECK Hypermetabolism corresponding to the left supraglottic laryngeal mass. This measures a S.U.V. max of 20.6, including on image 33/series 4. Probable extension across the midline, with early focus of hypermetabolism in the region of the right posterior larynx measuring a S.U.V. max of 7.1, including on approximately image 37/series 4. The dominant left level 2-3 nodal mass demonstrates hypermetabolism and central necrosis. This measures a S.U.V. max of 16.6 on image 27/series 4. There are small but hypermetabolic right-sided level 2 nodes, including at 7 mm and a S.U.V. max of 9.5 on image 34/series 4. Left supraclavicular hypermetabolic adenopathy, with an index node measuring 9 mm and a S.U.V. max of  9.0 on image 43/series 4. Other neck findings deferred to prior diagnostic CT. Mass-effect upon the airway persists. CHEST No other thoracic nodal hypermetabolism. A spiculated right lower lobe pulmonary nodule measures 1.9 x 1.8 cm and a S.U.V. max of 8.1 on image 43/series 8. 2 mm subpleural right lower lobe nodule on image 52/series 8. Mild cardiomegaly. Anterior pericardial thickening is not hypermetabolic. Coronary artery atherosclerosis. ABDOMEN/PELVIS No abdominopelvic nodal or parenchymal hypermetabolism identified. Normal adrenal glands. A subtle pericholecystic right liver lobe 13 mm lesion is suspected on image 105/series 4. Not hypermetabolic. Abdominal aortic atherosclerosis. Mild prostatomegaly. SKELETON Focus of hypermetabolism about the anterior right iliac bone is felt to correspond to degenerative osseous irregularity. This measures a S.U.V. max of 4.0  including on image 157/series 4. A focus of soft tissue hypermetabolism adjacent the greater trochanter of the right femur is without osseous or soft tissue correlate. Degenerate change of both glenohumeral joints. IMPRESSION: 1. 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 2. Hypermetabolic right lower lobe pulmonary nodule is favored to represent a synchronous primary bronchogenic neoplasm. Isolated pulmonary metastasis could look similar. No thoracic nodal hypermetabolism. 3. Soft tissue hypermetabolism about the right iliac bone and greater trochanter of the proximal right femur are favored to be degenerative or posttraumatic. Soft tissue metastasis felt highly unlikely. 4.  Coronary artery atherosclerosis. Aortic atherosclerosis. 5. A tiny right lower lobe pulmonary nodule is most likely a subpleural lymph node. Electronically Signed   By: Abigail Miyamoto M.D.   On: 06/08/2016 16:51   US Biopsy  Result Date: 05/18/2016 CLINICAL DATA:  Supraglottic neoplasm. Confluent left cervical adenopathy. EXAM: ULTRASOUND GUIDED CORE BIOPSY OF LEFT CERVICAL ADENOPATHY MEDICATIONS: Intravenous Fentanyl and Versed were administered as conscious sedation during continuous monitoring of the patient's level of consciousness and physiological / cardiorespiratory status by the radiology RN, with a total moderate sedation time of 10 minutes. PROCEDURE: The procedure, risks, benefits, and alternatives were explained to the patient. Questions regarding the procedure were encouraged and answered. The patient understands and consents to the procedure. Survey ultrasound of the left cervical region performed. Adenopathy was localized and an appropriate skin entry site was determined and marked. The operative field was prepped with chlorhexidine in a sterile fashion, and a sterile drape was applied covering the operative field. A sterile gown and sterile gloves were used  for the procedure. Local anesthesia was provided with 1% Lidocaine. Under real-time ultrasound guidance, a 17 gauge trocar needle was advanced to the margin of the lesion. Once needle tip position was confirmed, coaxial 18-gauge core biopsy samples were obtained, submitted in saline to surgical pathology. The guide needle was removed. Postprocedure scans show no hemorrhage or other apparent complication. The patient tolerated the procedure well. COMPLICATIONS: None. FINDINGS: Confluent left cervical adenopathy again localized. Representative core biopsy samples obtained as above. IMPRESSION: 1. Technically successful ultrasound-guided core biopsy of left cervical adenopathy. Electronically Signed   By: Lucrezia Europe M.D.   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[place]) His case is reviewed at the ENT tumor board I reviewed the imaging study with the patient He has a lung nodule in the right lower lobe, indeterminate as to the cause whether this could be due to metastatic supraglottic cancer versus primary lung cancer Due to his young age, we discussed the aggressive approach of concurrent chemoradiation therapy with the remote chance that he can be cured I am not optimistic about  his long-term prognosis I reinforced the importance of nicotine cessation and for him to stop drinking immediately We will arrange for port & feeding tube placement, chemo education class, blood work with plan to start his chemotherapy soon. We discussed the role of chemotherapy. The intent is for curative intent, although his likelihood of long term cure is remote.  We discussed some of the risks, benefits, side-effects of cisplatin  Some of the short term side-effects included, though not limited to, including weight loss, life threatening infections, risk of allergic reactions, need for transfusions of blood products, nausea, vomiting, change in bowel habits, loss of hair, admission to hospital for  various reasons, and risks of death.   Long term side-effects are also discussed including risks of infertility, permanent damage to nerve function, hearing loss, chronic fatigue, kidney damage with possibility needing hemodialysis, and rare secondary malignancy including bone marrow disorders.  The patient is aware that the response rates discussed earlier is not guaranteed.  After a long discussion, patient made an informed decision to proceed with the prescribed plan of care.   Patient education material was dispensed.    Alcohol abuse He has extensive history of alcohol abuse I recommend he quit within the next 2 weeks before starting treatment  TOBACCO ABUSE I spent some time counseling the patient the importance of tobacco cessation. We discussed common strategies including nicotine patches, Tobacco Quit-line, and other nicotine replacement products to assist in hiseffort to quit  he appears motivated to quit on his own   Cancer associated pain He has pain likely due to cancer We discussed importance of having only one doctor to prescribe all his pain medicine We discussed about the role of pain management in the oncology clinic. The prescribed pain regimen is for short term use only.  After the patient has completed the prescribed chemotherapy treatment, the pain medications will be slowly tapered off except for terminally ill patients who may have to remain on pain medications long term due to their terminal cancer.  The patient agreed to be compliant with prescribed pain regimen and promised not to share the prescribed medications. Any lost medications or missed prescription will not be refilled sooner than anticipated time when the patient's prescription is expected to run out. We also discussed narcotics refill policy in the clinic.  The patient is educated to check the pill bottles in the middle of the week and ensure there is adequate supply to last through the weekend until next  appointment or available business day.  The oncology service has a strict policy not to refill pain medications after business hours or the weekend.  If the patient is found to have violated the verbal agreement as stated no further pain medications will be prescribed in the future. I will start him on morphine sulfate and bring him back in 2 weeks to assess pain control   Orders Placed This Encounter  Procedures  . IR FLUORO GUIDE PORT INSERTION RIGHT    Standing Status:   Future    Standing Expiration Date:   08/14/2017    Order Specific Question:   Reason for Exam (SYMPTOM  OR DIAGNOSIS REQUIRED)    Answer:   need port for chemo    Order Specific Question:   Preferred Imaging Location?    Answer:   [facility]    Standing Status:   Future    Standing Expiration Date:   08/14/2017    Order  Specific Question:   Reason for Exam (SYMPTOM  OR DIAGNOSIS REQUIRED)    Answer:   need feeding tube    Order Specific Question:   Preferred Imaging Location?    Answer:   [facility]  . Magnesium    Standing Status:   Standing    Number of Occurrences:   22    Standing Expiration Date:   06/14/2017  . Comprehensive metabolic panel    Standing Status:   Standing    Number of Occurrences:   22    Standing Expiration Date:   06/14/2017  . CBC with Differential    Standing Status:   Standing    Number of Occurrences:   20    Standing Expiration Date:   06/15/2017   All questions were answered. The patient knows to call the clinic with any problems, questions or concerns. No barriers to learning was detected. I spent 40 minutes counseling the patient face to face. The total time spent in the appointment was 55 minutes and more than 50% was on counseling and review of test results     [name], MD 06/15/2016 6:40 AM

## 2016-06-15 NOTE — Assessment & Plan Note (Signed)
He has pain likely due to cancer We discussed importance of having only one doctor to prescribe all his pain medicine We discussed about the role of pain management in the oncology clinic. The prescribed pain regimen is for short term use only.  After the patient has completed the prescribed chemotherapy treatment, the pain medications will be slowly tapered off except for terminally ill patients who may have to remain on pain medications long term due to their terminal cancer.  The patient agreed to be compliant with prescribed pain regimen and promised not to share the prescribed medications. Any lost medications or missed prescription will not be refilled sooner than anticipated time when the patient's prescription is expected to run out. We also discussed narcotics refill policy in the clinic.  The patient is educated to check the pill bottles in the middle of the week and ensure there is adequate supply to last through the weekend until next appointment or available business day.  The oncology service has a strict policy not to refill pain medications after business hours or the weekend.  If the patient is found to have violated the verbal agreement as stated no further pain medications will be prescribed in the future. I will start him on morphine sulfate and bring him back in 2 weeks to assess pain control

## 2016-06-15 NOTE — Telephone Encounter (Signed)
06/15/2016  Patient:            Antonio [name] Date of Birth:  1955/09/25 MRN:                [id number]   I called patient at home and scheduled an oral examination for evaluation of healing from dental extraction with the oral surgeon. Patient scheduled for Monday, 06/19/2016 at 2 PM.  Dr. [name]

## 2016-06-16 ENCOUNTER — Ambulatory Visit
Admission: RE | Admit: 2016-06-16 | Discharge: 2016-06-16 | Disposition: A | Discharge status: Home / Self Care | Payer: Medicare Other | Source: Ambulatory Visit | Attending: Radiation Oncology | Admitting: Radiation Oncology

## 2016-06-16 ENCOUNTER — Encounter: Payer: Self-pay | Admitting: *Deleted

## 2016-06-16 ENCOUNTER — Other Ambulatory Visit: Payer: Self-pay | Admitting: Radiation Oncology

## 2016-06-16 VITALS — BP 122/87 | HR 94 | Temp 98.3°F | Ht 63.0 in | Wt 165.0 lb

## 2016-06-16 DIAGNOSIS — R634 Abnormal weight loss: Secondary | ICD-10-CM

## 2016-06-16 DIAGNOSIS — C321 Malignant neoplasm of supraglottis: Secondary | ICD-10-CM

## 2016-06-16 DIAGNOSIS — Z510 Encounter for antineoplastic radiation therapy: Secondary | ICD-10-CM | POA: Diagnosis not present

## 2016-06-16 DIAGNOSIS — K219 Gastro-esophageal reflux disease without esophagitis: Secondary | ICD-10-CM | POA: Diagnosis not present

## 2016-06-16 DIAGNOSIS — I10 Essential (primary) hypertension: Secondary | ICD-10-CM | POA: Diagnosis not present

## 2016-06-16 DIAGNOSIS — C3431 Malignant neoplasm of lower lobe, right bronchus or lung: Secondary | ICD-10-CM | POA: Diagnosis not present

## 2016-06-16 DIAGNOSIS — K5900 Constipation, unspecified: Secondary | ICD-10-CM | POA: Diagnosis not present

## 2016-06-16 DIAGNOSIS — E7800 Pure hypercholesterolemia, unspecified: Secondary | ICD-10-CM | POA: Diagnosis not present

## 2016-06-16 MED ORDER — SODIUM CHLORIDE 0.9% FLUSH
10.0000 mL | Freq: Once | INTRAVENOUS | Status: AC
Start: 2016-06-16 — End: 2016-06-16
  Administered 2016-06-16: 10 mL via INTRAVENOUS

## 2016-06-16 NOTE — Progress Notes (Addendum)
A user error has taken place: charting done on wrong day and has been corrected.

## 2016-06-16 NOTE — Progress Notes (Signed)
Oncology Nurse Navigator Documentation  Met with Antonio [name] and his sister during Established Patient appt with [name]. They voiced understanding of proposed HD Cistplatin to be given in conjunction with RT. We discussed further benefit of PAC and PEG.  They understand placement will be arranged within the next couple of weeks.  I reminded them I will provide PEG use/care instruction prior to placement, provide ongoing PEG support throughout tmt. They understand to contact me with needs/questions.  [name], RN, BSN, [place] Oncology Nurse [facility] [place] [phone]

## 2016-06-16 NOTE — Progress Notes (Signed)
Oncology Nurse Navigator Documentation  Met with Mr. Arzuaga during initial consult with Dr. Isidore Moos.  He was accompanied by his sister.  1. Provided introductory explanation of radiation treatment including SIM planning and purpose of Aquaplast head and shoulder mask, showed them example.   2. Of note:  Baseline audiology eval today following appt with ENT [name].  Further discussion re placement of PEG and PAC.  CT SIM is scheduled for this Friday.  3. I encouraged them to contact me with questions/concerns as treatments/procedures begin.  They verbalized understanding of information provided.    [name], RN, BSN, [place] [facility] Oncology Nurse [place] at [place] [phone]

## 2016-06-16 NOTE — Progress Notes (Signed)
Oncology Nurse Navigator Documentation  To provide support, encouragement and care continuity, met with Antonio [name] during CT SIM.  He was accompanied by his sister. Therapists reported he had difficulty with procedure.  He reported to me "mask was too tight, I couldn't breathe".  I explained the need for the mask to be snug, showed him his mask noting wholes large enough for breathing.  RTTs suggested he would benefit from Ativan prior to treatments, I notified Dr. Isidore Moos. I showed them his treatment area, L2, explained arrival/preparation procedures for RT. I provided him Epic calendar of upcoming appts, emphasized 5/8 8:00 arrival to Radiation Waiting after lobby registration for, [facility]. I escorted him to [employer] where he met with [name]. Because he is dependent on city bus service for upcoming radiation appts, I provided him a 31-day [place] bus pass obtained from [place]. They verbalized understanding of information provided, understand to call me with questions/needs.  [name], RN, BSN, [place] [facility] Oncology Nurse [place] at [place] [phone]

## 2016-06-16 NOTE — Progress Notes (Signed)
A user error has taken place: encounter opened in error, closed for administrative reasons.

## 2016-06-16 NOTE — Progress Notes (Signed)
Head and Neck Cancer Simulation, IMRT treatment planning, and Special treatment procedure note   Outpatient  Diagnosis:    ICD-9-CM ICD-10-CM   1. 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 An Aquaplast head and shoulder mask was custom fitted to the patient's anatomy. High-resolution CT axial imaging was obtained of the head and neck with contrast. I verified that the quality of the imaging is good for treatment planning. 1 Medically Necessary Treatment Device was fabricated and supervised by me: Aquaplast mask.  Treatment planning note I plan to treat the patient with IMRT. I plan to treat the patient's tumor and bilateral neck nodes. I plan to treat to a total dose of 70 Gray in 35 fractions. Dose calculation was ordered from dosimetry.  IMRT planning Note  IMRT is medically necessary and an important modality to deliver adequate dose to the patient's at risk tissues while sparing the patient's normal structures, including the: esophagus, parotid tissue, mandible, brain stem, spinal cord, oral cavity, brachial plexus.  This justifies the use of IMRT in the patient's treatment.   Special Treatment Procedure Note:  The patient will be receiving chemotherapy concurrently. Chemotherapy heightens the risk of side effects. I have considered this during the patient's treatment planning process and will monitor the patient accordingly for side effects on a weekly basis. Concurrent chemotherapy increases the complexity of this patient's treatment and therefore this constitutes a special treatment procedure.  -----------------------------------  [name], MD

## 2016-06-19 ENCOUNTER — Ambulatory Visit (HOSPITAL_COMMUNITY): Payer: Self-pay | Admitting: Dentistry

## 2016-06-19 ENCOUNTER — Telehealth: Payer: Self-pay | Admitting: Hematology and Oncology

## 2016-06-19 ENCOUNTER — Encounter (HOSPITAL_COMMUNITY): Payer: Self-pay | Admitting: Dentistry

## 2016-06-19 ENCOUNTER — Other Ambulatory Visit: Payer: Self-pay | Admitting: Radiation Oncology

## 2016-06-19 VITALS — BP 139/78 | HR 101 | Temp 99.0°F

## 2016-06-19 DIAGNOSIS — C329 Malignant neoplasm of larynx, unspecified: Secondary | ICD-10-CM | POA: Diagnosis not present

## 2016-06-19 DIAGNOSIS — K08109 Complete loss of teeth, unspecified cause, unspecified class: Secondary | ICD-10-CM

## 2016-06-19 DIAGNOSIS — K0820 Unspecified atrophy of edentulous alveolar ridge: Secondary | ICD-10-CM

## 2016-06-19 DIAGNOSIS — Z01818 Encounter for other preprocedural examination: Secondary | ICD-10-CM

## 2016-06-19 DIAGNOSIS — K08199 Complete loss of teeth due to other specified cause, unspecified class: Secondary | ICD-10-CM

## 2016-06-19 DIAGNOSIS — C321 Malignant neoplasm of supraglottis: Secondary | ICD-10-CM

## 2016-06-19 MED ORDER — LORAZEPAM 0.5 MG PO TABS: ORAL_TABLET | ORAL | 0 refills | Status: DC

## 2016-06-19 NOTE — Progress Notes (Signed)
PROGRESS NOTE:  06/19/2016 [name] [id number]  VITALS: BP 139/78 (BP Location: Left Arm)   Pulse (!) 101   Temp 99 F (37.2 C) (Oral)   LABS:  Lab Results  Component Value Date   WBC 7.2 05/18/2016   HGB 13.3 05/18/2016   HCT 39.1 05/18/2016   MCV 82.5 05/18/2016   PLT 349 05/18/2016   BMET    Component Value Date/Time   NA 135 05/18/2016 0359   NA 141 08/25/2015 1445   K 3.9 05/18/2016 0359   CL 98 (L) 05/18/2016 0359   CO2 27 05/18/2016 0359   GLUCOSE 85 05/18/2016 0359   BUN 11 05/18/2016 0359   BUN 7 08/25/2015 1445   CREATININE 0.93 05/18/2016 0359   CREATININE 0.86 02/04/2013 1129   CALCIUM 9.0 05/18/2016 0359   GFRNONAA >60 05/18/2016 0359   GFRAA >60 05/18/2016 0359    Lab Results  Component Value Date   INR 1.10 05/18/2016   No results found for: PTT   Wylie Coon is status post extraction tooth #17 with Dr. Frederik Schmidt, oral surgeon, on 06/06/2016. Patient now presents for evaluation of healing and suture removal as needed.  SUBJECTIVE: Patient with minimal complaints from dental extraction site. The area is still sore by patient report. Patient denies having any sutures present.  EXAM: There is no sign of infection, heme, or ooze. No sutures are noted. There is some food debris within the extraction socket. The area was irrigated with Monoject syringe with sterile saline. Radiographic interpretation: Orthopantogram was retaken. Radiolucency noted in the area of recent extraction of tooth #17.   ASSESSMENT: Loss of tooth #17 due to extraction. Patient is now edentulous. Extraction socket area numbers 17 will need to heal in by secondary intention.  PLAN: 1. Patient is to continue using salt water rinses as needed to aid healing. Recommended use of Monoject Olin Hauser is to help keep extraction socket area numbers 17 free of food debris. 2. Patient to follow-up with Dr. [name] this Wednesday, 06/21/2016 at 4:30 PM to reevaluate healing  and provide clearance to start chemoradiation therapy on 06/27/2016.   Lenn Cal, DDS

## 2016-06-19 NOTE — Telephone Encounter (Signed)
Called to confirm appt 5/16 f/u with [name] has been scheduled ,. Patient is aware of appt date and time.

## 2016-06-19 NOTE — Patient Instructions (Signed)
PLAN: 1. Patient is to continue using salt water rinses as needed to aid healing. Recommended use of Monoject Olin Hauser is to help keep extraction socket area numbers 17 free of food debris. 2. Patient to follow-up with Dr. [name] this Wednesday, 06/21/2016 at 4:30 PM to reevaluate healing and provide clearance to start chemoradiation therapy on 06/27/2016.   Lenn Cal, DDS

## 2016-06-20 ENCOUNTER — Encounter: Payer: Self-pay | Admitting: *Deleted

## 2016-06-20 ENCOUNTER — Ambulatory Visit
Admission: RE | Admit: 2016-06-20 | Discharge: 2016-06-20 | Disposition: A | Discharge status: Home / Self Care | Payer: Medicare Other | Source: Ambulatory Visit | Attending: Radiation Oncology | Admitting: Radiation Oncology

## 2016-06-20 ENCOUNTER — Ambulatory Visit: Payer: Medicare Other | Admitting: Nutrition

## 2016-06-20 ENCOUNTER — Ambulatory Visit: Payer: Medicare Other | Attending: Internal Medicine

## 2016-06-20 ENCOUNTER — Ambulatory Visit: Payer: Medicare Other | Admitting: Physical Therapy

## 2016-06-20 ENCOUNTER — Other Ambulatory Visit: Payer: Medicare Other

## 2016-06-20 VITALS — BP 146/88 | HR 97 | Temp 98.4°F | Wt 162.2 lb

## 2016-06-20 DIAGNOSIS — R471 Dysarthria and anarthria: Secondary | ICD-10-CM | POA: Insufficient documentation

## 2016-06-20 DIAGNOSIS — R293 Abnormal posture: Secondary | ICD-10-CM | POA: Diagnosis not present

## 2016-06-20 DIAGNOSIS — R2689 Other abnormalities of gait and mobility: Secondary | ICD-10-CM | POA: Insufficient documentation

## 2016-06-20 DIAGNOSIS — C321 Malignant neoplasm of supraglottis: Secondary | ICD-10-CM

## 2016-06-20 DIAGNOSIS — R29898 Other symptoms and signs involving the musculoskeletal system: Secondary | ICD-10-CM | POA: Diagnosis not present

## 2016-06-20 DIAGNOSIS — R1312 Dysphagia, oropharyngeal phase: Secondary | ICD-10-CM | POA: Diagnosis not present

## 2016-06-20 NOTE — Therapy (Signed)
[place], [place], [address] Phone: [phone]   Fax:  [phone]  Physical Therapy Evaluation  Patient Details  Name: Antonio [name] MRN: [id number] Date of Birth: [birth year] Referring Provider: Dr. [name]  Encounter Date: 06/20/2016      PT End of Session - 06/20/16 1041    Visit Number 1   Number of Visits 1   PT Start Time 0930   PT Stop Time 1000   PT Time Calculation (min) 30 min   Activity Tolerance Patient tolerated treatment well   Behavior During Therapy [facility] for tasks assessed/performed      Past Medical History:  Diagnosis Date  . Allergy    allergic rhinitis  . Arthritis    "qwhere" (05/17/2016)  . Chronic cough   . Chronic sinus infection   . Colonic polyp   . COPD (chronic obstructive pulmonary disease) ([place])   . 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 Dysphagia    normal EGD except for mild gastritis in 01/2006  . GERD (gastroesophageal reflux disease)   . Head and neck malignancy ([place]) 05/18/2016  . High cholesterol   . Hypertension   . Rectal bleeding    colonoscopy in 01/2006  . Rectal polyp    01/2006 with path showing it to be benign, though with a question that it was incomplete biopsy, next scheduled coloscopy 2010  . Sickle cell trait ([place])   . Tobacco abuse   . Varus deformity of knee    bilateral, congenital    Past Surgical History:  Procedure Laterality Date  . COLONOSCOPY W/ BIOPSIES AND POLYPECTOMY  X 2  . MULTIPLE TOOTH EXTRACTIONS  ~ 2008   "took them all out"    There were no vitals filed for this visit.       Subjective Assessment - 06/20/16 1018    Subjective Has arthritis in his knees and has braces for both knees.   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   Will have concurrent chemoradiation starting 06/27/16.  Current smoker; h/o crack cocaine, marijuana use, ETOH.   Patient Stated Goals get info from all head & neck clinic providers   Currently in Pain? 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 [name]   Onset Date/Surgical Date 05/17/16   Prior Therapy none     Precautions   Precautions Other (comment)   Precaution Comments cancer precautions; he is a bit difficult to understand     Restrictions   Weight Bearing Restrictions No     Balance Screen   Has the patient fallen in the past 6 months No   Has the patient had a decrease in activity level because of a fear of falling?  No   Is the patient reluctant to leave their home because of a fear of falling?  No  Home Environment   Living Environment Private residence   Living Arrangements Alone   Type of [place] One level     Prior Function   Level of Independence Independent with household mobility with device   Leisure no regular exercise     Cognition   Overall Cognitive Status Difficult to assess     Observation/Other Assessments   Observations swollen at left face and neck; significantly bowed legs bilat.     Functional Tests   Functional tests Sit to Stand     Sit to Stand   Comments 11 times in 30 seconds, below average for age     Posture/Postural Control   Posture/Postural Control Postural limitations   Postural Limitations Rounded Shoulders;Forward head   Posture Comments significant varus of both knees     ROM / Strength   AROM / PROM / Strength  AROM     AROM   Overall AROM Comments shoulder AROM WFL throughout bilat.   AROM Assessment Site Cervical  has pain with some movements at left neck   Cervical Flexion WFL   Cervical Extension 25% ltd.   Cervical - Right Side Bend 50% ltd.   Cervical - Left Side Bend WFL   Cervical - Right Rotation 10% ltd.   Cervical - Left Rotation 50% ltd.     Palpation   Palpation comment firm to touch at swelling at left side of face and neck     Ambulation/Gait   Ambulation/Gait Yes   Ambulation/Gait Assistance 6: Modified independent (Device/Increase time)   Assistive device Other (Comment)  brace Rt knee today; has Lt knee brace; walking stick   Stairs --  reports he avoids stairs; uses railing   Gait Comments Patient walked a few feet without the stick today; he demonstrates that his right knee gives way laterally when he doesn't have the brace on (which he doesn't on that side today)           LYMPHEDEMA/ONCOLOGY QUESTIONNAIRE - 06/20/16 1039      Type   Cancer Type Lt supraglottic squamous cell carcinoma with bilateral neck adenopathy and right lower lobe nodule seen on PET but not biopsied     Lymphedema Assessments   Lymphedema Assessments Head and Neck     Head and Neck   4 cm superior to sternal notch around neck 42.2 cm   6 cm superior to sternal notch around neck 44 cm   8 cm superior to sternal notch around neck 45.5 cm                        PT Education - 06/20/16 1040    Education provided Yes   Education Details neck ROM, posture, breathing, walking or other exercise, Cure article on staying active, lymphedema and PT info   Person(s) Educated Patient   Methods Explanation;Handout   Comprehension Verbalized understanding          PT Short Term Goals - 04/05/16 1349      PT SHORT TERM GOAL #1   Title make referral for orthotist   Time 2   Period Weeks   Status Achieved           PT Long Term Goals - 04/05/16 1349      PT LONG  TERM GOAL #1   Title report pain in the hip and leg decreased 25%   Time 6   Period Weeks   Status New  Head and Neck Clinic Goals - 06/20/16 1048      Patient will be able to verbalize understanding of a home exercise program for cervical range of motion, posture, and walking.    Status Achieved     Patient will be able to verbalize understanding of proper sitting and standing posture.    Status Achieved     Patient will be able to verbalize understanding of lymphedema risk and availability of treatment for this condition.    Status Achieved           Plan - 06/20/16 1041    Clinical Impression Statement This is a pleasant gentleman who is a little difficult to understand due to tooth extractions and is also a little difficult to "read" in regard to how much of the information he was taking in from our session today.  He has significant swelling and pain at left face and neck, significantly limited neck AROM; has bilat. knee varus and uses brace(s) and a walking stick.  He scored below average on 30 second sit to stand test. Eval is moderate complexity with comorbidities of arthritis, COPD, and others and because of personal factors including drug use; evolving as he will start treatment soon for his head & neck cancer.   Rehab Potential Good   PT Frequency One time visit   PT Treatment/Interventions Patient/family education   PT Next Visit Plan None at this time; patient may need therapy going forward should lymphedema develop with treatment.   PT Home Exercise Plan neck ROM, walking or other exercise   Consulted and Agree with Plan of Care Patient      Patient will benefit from skilled therapeutic intervention in order to improve the following deficits and impairments:  Pain, Increased edema, Decreased range of motion, Difficulty walking, Postural dysfunction  Visit Diagnosis: Abnormal posture - Plan: PT plan of care cert/re-cert  Other  symptoms and signs involving the musculoskeletal system - Plan: PT plan of care cert/re-cert  Other abnormalities of gait and mobility - Plan: PT plan of care cert/re-cert      G-Codes - 16/10/96 1048    Functional Assessment Tool Used (Outpatient Only) clinical judegement   Functional Limitation Changing and maintaining body position   Mobility: Walking and Moving Around Current Status (E4540) At least 40 percent but less than 60 percent impaired, limited or restricted   Mobility: Walking and Moving Around Goal Status [phone]) At least 40 percent but less than 60 percent impaired, limited or restricted   Changing and Maintaining Body Position Current Status (J4782) At least 40 percent but less than 60 percent impaired, limited or restricted       Problem List Patient Active Problem List   Diagnosis Date Noted  . Cancer associated pain 05/31/2016  . Weight loss 05/31/2016  . Dysarthria 05/31/2016  . Alcohol abuse 05/20/2016  . 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[place]) 05/18/2016  . Sciatic leg pain 02/24/2015  . Preventative health care 12/01/2014  . Hyperlipidemia 02/20/2013  . Varus deformity of knee 06/16/2011  . TOBACCO ABUSE 05/07/2006  . COLONIC POLYPS 03/14/2006  . Allergic rhinitis 01/19/2006  . COPD 01/19/2006  . Essential hypertension 01/11/2006  . Osteoarthrosis involving lower leg 01/11/2006    Antonio [name] 06/20/2016, 10:52 AM  [place], [place], [address] Phone: [phone]   Fax:  [phone]  Name: Antonio [name] MRN: [id number] Date of Birth: Apr 20, 1955  Serafina Royals, PT 06/20/16 10:52 AM

## 2016-06-20 NOTE — Therapy (Addendum)
[facility] [address] [place], [place], [address] Phone: [phone]   Fax:  [phone]  Speech Language Pathology Evaluation  Patient Details  Name: Antonio [name] MRN: [id number] Date of Birth: 1955/08/12 Referring Provider: [name], MD  Encounter Date: 06/20/2016      End of Session - 06/20/16 1051    Visit Number 1   Number of Visits 3   Date for SLP Re-Evaluation 08/25/16   SLP Start Time 1033   SLP Stop Time  1110   SLP Time Calculation (min) 37 min   Activity Tolerance Patient limited by lethargy      Past Medical History:  Diagnosis Date  . Allergy    allergic rhinitis  . Arthritis    "qwhere" (05/17/2016)  . Chronic cough   . Chronic sinus infection   . Colonic polyp   . COPD (chronic obstructive pulmonary disease) ([facility])   . 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 Dysphagia    normal EGD except for mild gastritis in 01/2006  . GERD (gastroesophageal reflux disease)   . Head and neck malignancy ([facility]) 05/18/2016  . High cholesterol   . Hypertension   . Rectal bleeding    colonoscopy in 01/2006  . Rectal polyp    01/2006 with path showing it to be benign, though with a question that it was incomplete biopsy, next scheduled coloscopy 2010  . Sickle cell trait ([facility])   . Tobacco abuse   . Varus deformity of knee    bilateral, congenital    Past Surgical History:  Procedure Laterality Date  . COLONOSCOPY W/ BIOPSIES AND POLYPECTOMY  X 2  . MULTIPLE TOOTH EXTRACTIONS  ~ 2008   "took them all out"    There were no vitals filed for this visit.      Subjective Assessment - 06/20/16 1023    Subjective Pt with crack cocaine, marijuana use/abuse in history, tobacco and ETOH abuse. With sister today    Patient is accompained by: Family member  sister   Currently in Pain? 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 Beginning chemorad tx 06-27-16. current smoker, crack cocaine, marijuana user, with 6-8 beers/week.       Prior Functional Status   Cognitive/Linguistic Baseline Within functional limits     Cognition   Overall Cognitive Status Difficult to assess   Difficult to assess due to Level of arousal  eyes bloodshot and pt somnolent     Auditory Comprehension   Overall Auditory Comprehension Appears within functional limits for tasks assessed     Verbal Expression   Overall Verbal Expression Appears within functional limits for tasks assessed     Oral Motor/Sensory Function   Overall Oral Motor/Sensory Function Impaired  Labial ROM Reduced left   Labial Symmetry Abnormal symmetry left   Labial Strength Reduced Left   Labial Coordination Reduced   Lingual ROM Reduced left   Lingual Symmetry Abnormal symmetry left   Lingual Strength --  difficult to assess lt side due to decr'd ROM   Lingual Sensation Within Functional Limits   Lingual Coordination Reduced   Facial ROM Within Functional Limits   Velum Impaired left     Motor Speech   Overall Motor Speech Impaired   Articulation Impaired   Level of Impairment Phrase   Intelligibility Intelligibility reduced   Phrase 75-100% accurate   Sentence 75-100% accurate   Conversation 75-100% accurate   Motor Speech Errors Consistent   Effective Techniques Slow rate      Pt currently tolerates what reportedly appears like dys I with thin liquids. Pt is  endentulous. He can produce a strong cough and throat clear.  POs: Pt ate applesauce (approx 1/2 teaspoon bites - "I gotta keep it small") and drank H2O without overt s/s aspiration during PO administration. Thyroid elevation appeared adequate, and swallows appeared timely. Pt noted to double swallow with all swallows. Pt's swallow deemed WFL at this time. Pt denies overt s/s aspiration PNA in the last 12 months.  Because data states the risk for dysphagia during and after radiation treatment is high due to undergoing radiation tx, SLP taught pt and sister about the possibility of reduced/limited ability for PO intake during rad tx. SLP encouraged pt to continue swallowing POs as far into rad tx as possible, even ingesting POs and/or completing HEP shortly after administration of pain meds.   SLP educated pt and sister re: changes to swallowing musculature after rad tx, and why adherence to dysphagia HEP provided today and PO consumption was necessary to inhibit muscular disuse atrophy and to reduce muscle fibrosis following rad tx. Pt and sister demonstrated understanding of these things to SLP, however pt was somnolent at times during ST eval today.    SLP then developed a HEP for pt and pt and sister were instructed how to perform exercises involving lingual, vocal, and pharyngeal strengthening. SLP performed each exercise and pt return demonstrated each exercise. SLP ensured pt performance was correct prior to moving on to next exercise. Pt was instructed to complete this program 2-3 times a day until 6 months after his last rad tx, then x2 a week after that.                     SLP Education - 06/20/16 1114    Education provided Yes   Education Details HEP, late effects head/neck radiation on swallowing ability   Person(s) Educated Patient  sister   Methods Explanation;Demonstration;Verbal cues;Handout   Comprehension Returned demonstration;Verbal cues required;Verbalized  understanding;Need further instruction          SLP Short Term Goals - 06/20/16 1126      SLP SHORT TERM GOAL #1   Title pt will complete HEP with min A   Time 1   Period --  visit   Status New     SLP SHORT TERM GOAL #2   Title pt will tell SLP why he is completing HEP    Time 1   Period --  visit   Status New     SLP SHORT TERM GOAL #3   Title pt will tell SLP 3 overt s/s aspiration PNA with occasional min A   Time 1   Period --  visit   Status New          SLP Long Term Goals - Jul 09, 2016 1128      SLP LONG TERM GOAL #1   Title pt will complete HEP with min A over two sessions   Time 2   Period --  visits   Status New     SLP LONG TERM GOAL #2   Title pt will tell 3 overt s/s aspiration PNA with modified independence   Time 2   Period --  visit   Status New     SLP LONG TERM GOAL #3   Title pt will name 3 food items he could consume when recovering from radiation/chemo tx   Time 2   Period --  visits   Status New          Plan - Jul 09, 2016 1115    Clinical Impression Statement Pt with baseline dysphagia suspected mostly with an oral component, but possibly a pharyngeal component as well. Based upon his history, pt with probable poor decision making prior to cancer diagnosis - SLP unsure of pt's prognosis for adherence to recommended frequency or duration of HEP. Skilled ST is necessary to assess PO safety and procedure with HEP during and after chemoradiation therapy.    Speech Therapy Frequency --  approx once every four weeks   Duration --  3 visits   Treatment/Interventions Aspiration precaution training;Pharyngeal strengthening exercises;Diet toleration management by SLP;Compensatory techniques;Internal/external aids;SLP instruction and feedback;Patient/family education;Trials of upgraded texture/liquids   Potential to Achieve Goals Fair   Potential Considerations Cooperation/participation level      Patient will benefit from skilled  therapeutic intervention in order to improve the following deficits and impairments:   Dysphagia, oropharyngeal phase - Plan: SLP plan of care cert/re-cert  Dysarthria and anarthria      G-Codes - 07-09-16 1131    Functional Assessment Tool Used NOMS   Functional Limitations Swallowing   Swallow Current Status (H8469) At least 20 percent but less than 40 percent impaired, limited or restricted   Swallow Goal Status (G2952) At least 1 percent but less than 20 percent impaired, limited or restricted      Problem List Patient Active Problem List   Diagnosis Date Noted  . Cancer associated pain 05/31/2016  . Weight loss 05/31/2016  . Dysarthria 05/31/2016  . Alcohol abuse 05/20/2016  . 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[facility]) 05/18/2016  . Sciatic leg pain 02/24/2015  . Preventative health care 12/01/2014  . Hyperlipidemia 02/20/2013  . Varus deformity of knee 06/16/2011  . TOBACCO ABUSE 05/07/2006  . COLONIC POLYPS 03/14/2006  . Allergic rhinitis 01/19/2006  . COPD 01/19/2006  . Essential hypertension 01/11/2006  . Osteoarthrosis involving lower leg 01/11/2006    [facility] ,[place], CCC-SLP  2016/07/09, 12:46 PM  [facility] [address] [place], [place], [address] Phone: [phone]   Fax:  [phone]  Name: Favor Hackler MRN: [id number] Date of Birth: 1955/04/06

## 2016-06-20 NOTE — Patient Instructions (Signed)
SWALLOWING EXERCISES Do these 6 of the 7 days per week until your last day of radiation , then 2 times per week afterwards  1. Effortful Swallows - Press your tongue against the roof of your mouth for 3 seconds, then squeeze          the muscles in your neck while you swallow your saliva or a sip of water - Repeat 10-20 times, 2-3 times a day, and use whenever you eat or drink  2. Masako Swallow - swallow with your tongue sticking out - Stick tongue out past your teeth and gently bite tongue with your teeth - Swallow, while holding your tongue with your teeth - Repeat 10-20 times, 2-3 times a day *use a wet spoon if your mouth gets dry*  3. Shaker Exercise - head lift - Lie flat on your back in your bed or on a couch without pillows - Raise your head and look at your feet - KEEP YOUR SHOULDERS DOWN - HOLD FOR 45-60 SECONDS, then lower your head back down - Repeat 3 times, 2-3 times a day  4. Mendelsohn Maneuver - "half swallow" exercise - Start to swallow, and keep your Adam's apple up by squeezing hard with the            muscles of the throat - Hold the squeeze for 5-7 seconds and then relax - Repeat 10-20 times, 2-3 times a day *use a wet spoon if your mouth gets dry*  5. Breath Hold - Say "HUH!" loudly, then hold your breath for 3 seconds at your voice box - Repeat 20 times, 2-3 times a day  6. Chin pushback - Open your mouth  - Place your fist UNDER your chin near your neck, and push back with your fist for 5 seconds - Repeat 10 times, 2-3 times a day

## 2016-06-20 NOTE — Progress Notes (Signed)
Patient was seen in [facility] Neck Clinic.  61 year old male diagnosed with Larynx cancer receiving concurrent chemo radiation therapy. He will have 35 fractions of radiation and High dose cisplatin every 21 days.  Past medical history includes osteoarthritis, COPD, dysphasia, hypercholesterolemia, hypertension, GERD, tobacco and crack.  Medications include Lipitor  Labs include were reviewed.  Height: 63 inches. Weight: 162.2 pounds on May 8. Usual body weight: 177 pounds January 2018 BMI: 28.73.  Patient will be receiving a Port-A-Cath and a feeding tube. Patient endorses weight loss and reports it was unintentional. Has a lot of questions regarding feeding tube. Patient is edentulous and has difficulty chewing and swallowing.  Nutrition diagnosis: Unintentional weight loss related to new diagnosis of larynx cancer as evidenced by 8 percent weight loss in 4 months.  Intervention: Educated patient on the importance of consuming high-calorie high-protein foods in small frequent meals and snacks. Recommended patient begin one to 2 oral nutrition supplements daily.  I provided samples. Educated patient on strategies for eating with difficulty swallowing and chewing. Fact sheets were given and questions were answered.  Teach back method used.  Contact information provided.  Monitoring, evaluation, goals: Patient will tolerate increased calories and protein to minimize weight loss throughout treatment.  Next visit: To be scheduled with radiation therapies.  **Disclaimer: This note was dictated with voice recognition software. Similar sounding words can inadvertently be transcribed and this note may contain transcription errors which may not have been corrected upon publication of note.**

## 2016-06-21 ENCOUNTER — Other Ambulatory Visit: Payer: Self-pay | Admitting: Radiology

## 2016-06-21 DIAGNOSIS — K219 Gastro-esophageal reflux disease without esophagitis: Secondary | ICD-10-CM | POA: Diagnosis not present

## 2016-06-21 DIAGNOSIS — E7800 Pure hypercholesterolemia, unspecified: Secondary | ICD-10-CM | POA: Diagnosis not present

## 2016-06-21 DIAGNOSIS — C321 Malignant neoplasm of supraglottis: Secondary | ICD-10-CM | POA: Diagnosis not present

## 2016-06-21 DIAGNOSIS — I10 Essential (primary) hypertension: Secondary | ICD-10-CM | POA: Diagnosis not present

## 2016-06-21 DIAGNOSIS — C3431 Malignant neoplasm of lower lobe, right bronchus or lung: Secondary | ICD-10-CM | POA: Diagnosis not present

## 2016-06-21 DIAGNOSIS — Z510 Encounter for antineoplastic radiation therapy: Secondary | ICD-10-CM | POA: Diagnosis not present

## 2016-06-21 DIAGNOSIS — K5900 Constipation, unspecified: Secondary | ICD-10-CM | POA: Diagnosis not present

## 2016-06-21 NOTE — Progress Notes (Signed)
Head & Neck Multidisciplinary Clinic Clinical Social Work  Clinical Social Work met with patient/family at head & neck multidisciplinary clinic to offer support and assess for psychosocial needs.  CSW spoke privately with patient, then was accompanied by patient's sister.  Antonio [name] expressed feeling overwhelmed by the amount of appointments scheduled and recently having teeth extractions.  His main concern was transportation to his appointments.  Patient reported he is able to ride the bus, however, sister stated they would help bring him to appointments.  CSW completed and turned in [employer] application and reviewed process for utilizing [employer] transportation service.  Clinical Social Work briefly discussed Clinical Social Work role and [employer] support programs/services.  Clinical Social Work encouraged patient to call with any additional questions or concerns.   Maryjean Morn, MSW, LCSW, OSW-C Clinical Social Worker [facility] [phone]

## 2016-06-22 ENCOUNTER — Telehealth: Payer: Self-pay | Admitting: *Deleted

## 2016-06-22 NOTE — Telephone Encounter (Signed)
Oncology Nurse Navigator Documentation  Received call from oral surgeon Dr. Raynelle Dick office.  Mr. Hilburn was seen by Dr. [name] this morning and is released to start radiation treatments.  Drs. Herbie Drape and [place] notified.  [name], RN, BSN, [place] Neck Oncology Nurse [place] at [place] [phone]

## 2016-06-23 ENCOUNTER — Other Ambulatory Visit: Payer: Self-pay | Admitting: Hematology and Oncology

## 2016-06-23 ENCOUNTER — Encounter (HOSPITAL_COMMUNITY): Payer: Self-pay

## 2016-06-23 ENCOUNTER — Ambulatory Visit: Payer: Medicare Other | Admitting: Radiation Oncology

## 2016-06-23 ENCOUNTER — Ambulatory Visit (HOSPITAL_COMMUNITY)
Admission: RE | Admit: 2016-06-23 | Discharge: 2016-06-23 | Disposition: A | Discharge status: Home / Self Care | Payer: Medicare Other | Source: Ambulatory Visit | Attending: Hematology and Oncology | Admitting: Hematology and Oncology

## 2016-06-23 ENCOUNTER — Encounter: Payer: Self-pay | Admitting: *Deleted

## 2016-06-23 ENCOUNTER — Inpatient Hospital Stay (HOSPITAL_COMMUNITY): Admission: RE | Admit: 2016-06-23 | Payer: Self-pay | Source: Ambulatory Visit

## 2016-06-23 ENCOUNTER — Ambulatory Visit (HOSPITAL_COMMUNITY): Payer: Medicare Other

## 2016-06-23 DIAGNOSIS — D573 Sickle-cell trait: Secondary | ICD-10-CM | POA: Insufficient documentation

## 2016-06-23 DIAGNOSIS — I10 Essential (primary) hypertension: Secondary | ICD-10-CM | POA: Diagnosis not present

## 2016-06-23 DIAGNOSIS — M1990 Unspecified osteoarthritis, unspecified site: Secondary | ICD-10-CM | POA: Diagnosis not present

## 2016-06-23 DIAGNOSIS — Z86010 Personal history of colonic polyps: Secondary | ICD-10-CM | POA: Insufficient documentation

## 2016-06-23 DIAGNOSIS — R05 Cough: Secondary | ICD-10-CM | POA: Insufficient documentation

## 2016-06-23 DIAGNOSIS — Z452 Encounter for adjustment and management of vascular access device: Secondary | ICD-10-CM | POA: Diagnosis not present

## 2016-06-23 DIAGNOSIS — Q741 Congenital malformation of knee: Secondary | ICD-10-CM | POA: Diagnosis not present

## 2016-06-23 DIAGNOSIS — J329 Chronic sinusitis, unspecified: Secondary | ICD-10-CM | POA: Diagnosis not present

## 2016-06-23 DIAGNOSIS — C321 Malignant neoplasm of supraglottis: Secondary | ICD-10-CM

## 2016-06-23 DIAGNOSIS — R1310 Dysphagia, unspecified: Secondary | ICD-10-CM | POA: Diagnosis not present

## 2016-06-23 DIAGNOSIS — F17210 Nicotine dependence, cigarettes, uncomplicated: Secondary | ICD-10-CM | POA: Diagnosis not present

## 2016-06-23 DIAGNOSIS — M170 Bilateral primary osteoarthritis of knee: Secondary | ICD-10-CM | POA: Diagnosis not present

## 2016-06-23 DIAGNOSIS — K219 Gastro-esophageal reflux disease without esophagitis: Secondary | ICD-10-CM | POA: Insufficient documentation

## 2016-06-23 DIAGNOSIS — J449 Chronic obstructive pulmonary disease, unspecified: Secondary | ICD-10-CM | POA: Insufficient documentation

## 2016-06-23 DIAGNOSIS — C329 Malignant neoplasm of larynx, unspecified: Secondary | ICD-10-CM | POA: Diagnosis not present

## 2016-06-23 DIAGNOSIS — C140 Malignant neoplasm of pharynx, unspecified: Secondary | ICD-10-CM | POA: Insufficient documentation

## 2016-06-23 DIAGNOSIS — E7800 Pure hypercholesterolemia, unspecified: Secondary | ICD-10-CM | POA: Diagnosis not present

## 2016-06-23 HISTORY — PX: IR FLUORO GUIDE PORT INSERTION RIGHT: IMG5741

## 2016-06-23 HISTORY — PX: IR US GUIDE VASC ACCESS RIGHT: IMG2390

## 2016-06-23 HISTORY — PX: IR GASTROSTOMY TUBE MOD SED: IMG625

## 2016-06-23 LAB — BASIC METABOLIC PANEL
Anion gap: 12 (ref 5–15)
BUN: 9 mg/dL (ref 6–20)
CHLORIDE: 97 mmol/L — AB (ref 101–111)
CO2: 30 mmol/L (ref 22–32)
CREATININE: 1.05 mg/dL (ref 0.61–1.24)
Calcium: 9.4 mg/dL (ref 8.9–10.3)
GFR calc Af Amer: >60 mL/min (ref >60)
GFR calc non Af Amer: >60 mL/min (ref >60)
GLUCOSE: 116 mg/dL — AB (ref 65–99)
Potassium: 3.4 mmol/L — ABNORMAL LOW (ref 3.5–5.1)
Sodium: 139 mmol/L (ref 135–145)

## 2016-06-23 LAB — CBC WITH DIFFERENTIAL/PLATELET
Basophils Absolute: 0 10*3/uL (ref 0.0–0.1)
Basophils Relative: 0 %
EOS ABS: 0.1 10*3/uL (ref 0.0–0.7)
EOS PCT: 2 %
HCT: 41.2 % (ref 39.0–52.0)
HEMOGLOBIN: 14 g/dL (ref 13.0–17.0)
LYMPHS ABS: 1.5 10*3/uL (ref 0.7–4.0)
Lymphocytes Relative: 23 %
MCH: 28.4 pg (ref 26.0–34.0)
MCHC: 34 g/dL (ref 30.0–36.0)
MCV: 83.6 fL (ref 78.0–100.0)
MONOS PCT: 13 %
Monocytes Absolute: 0.9 10*3/uL (ref 0.1–1.0)
Neutro Abs: 4.2 10*3/uL (ref 1.7–7.7)
Neutrophils Relative %: 62 %
PLATELETS: 411 10*3/uL — AB (ref 150–400)
RBC: 4.93 MIL/uL (ref 4.22–5.81)
RDW: 13.9 % (ref 11.5–15.5)
WBC: 6.7 10*3/uL (ref 4.0–10.5)

## 2016-06-23 LAB — PROTIME-INR
INR: 1.01
PROTHROMBIN TIME: 13.3 s (ref 11.4–15.2)

## 2016-06-23 MED ORDER — SODIUM CHLORIDE 0.9 % IV SOLN
INTRAVENOUS | Status: DC
  Administered 2016-06-23: 10:00:00 via INTRAVENOUS

## 2016-06-23 MED ORDER — LIDOCAINE-EPINEPHRINE (PF) 2 %-1:200000 IJ SOLN
INTRAMUSCULAR | Status: AC
  Filled 2016-06-23: qty 20

## 2016-06-23 MED ORDER — FENTANYL CITRATE (PF) 100 MCG/2ML IJ SOLN
INTRAMUSCULAR | Status: AC | PRN
  Administered 2016-06-23: 25 ug via INTRAVENOUS

## 2016-06-23 MED ORDER — IOPAMIDOL (ISOVUE-300) INJECTION 61%
INTRAVENOUS | Status: AC
  Administered 2016-06-23: 10 mL
  Filled 2016-06-23: qty 50

## 2016-06-23 MED ORDER — LIDOCAINE-EPINEPHRINE (PF) 2 %-1:200000 IJ SOLN
INTRAMUSCULAR | Status: AC | PRN
  Administered 2016-06-23: 10 mL via INTRADERMAL

## 2016-06-23 MED ORDER — MIDAZOLAM HCL 2 MG/2ML IJ SOLN
INTRAMUSCULAR | Status: AC | PRN
  Administered 2016-06-23: 1 mg via INTRAVENOUS

## 2016-06-23 MED ORDER — MIDAZOLAM HCL 2 MG/2ML IJ SOLN
INTRAMUSCULAR | Status: AC | PRN
  Administered 2016-06-23 (×2): 1 mg via INTRAVENOUS

## 2016-06-23 MED ORDER — HEPARIN SOD (PORK) LOCK FLUSH 100 UNIT/ML IV SOLN
INTRAVENOUS | Status: AC
  Administered 2016-06-23: 500 [IU]
  Filled 2016-06-23: qty 5

## 2016-06-23 MED ORDER — FENTANYL CITRATE (PF) 100 MCG/2ML IJ SOLN
INTRAMUSCULAR | Status: AC | PRN
  Administered 2016-06-23: 50 ug via INTRAVENOUS

## 2016-06-23 MED ORDER — CEFAZOLIN SODIUM-DEXTROSE 2-4 GM/100ML-% IV SOLN
INTRAVENOUS | Status: AC
  Administered 2016-06-23: 2 g via INTRAVENOUS
  Filled 2016-06-23: qty 100

## 2016-06-23 MED ORDER — GLUCAGON HCL (RDNA) 1 MG IJ SOLR
INTRAMUSCULAR | Status: AC | PRN
  Administered 2016-06-23: .5 mg via INTRAVENOUS

## 2016-06-23 MED ORDER — MIDAZOLAM HCL 2 MG/2ML IJ SOLN
INTRAMUSCULAR | Status: AC
  Filled 2016-06-23: qty 8

## 2016-06-23 MED ORDER — FENTANYL CITRATE (PF) 100 MCG/2ML IJ SOLN
INTRAMUSCULAR | Status: AC
  Filled 2016-06-23: qty 8

## 2016-06-23 MED ORDER — HYDROCODONE-ACETAMINOPHEN 5-325 MG PO TABS
1.0000 | ORAL_TABLET | ORAL | Status: AC
  Administered 2016-06-23: 1 via ORAL
  Filled 2016-06-23: qty 1

## 2016-06-23 MED ORDER — CEFAZOLIN SODIUM-DEXTROSE 2-4 GM/100ML-% IV SOLN
2.0000 g | INTRAVENOUS | Status: AC
  Administered 2016-06-23: 2 g via INTRAVENOUS

## 2016-06-23 MED ORDER — HYDROCODONE-ACETAMINOPHEN 5-325 MG PO TABS: 1.0000 | ORAL_TABLET | ORAL | 0 refills | Status: DC | PRN

## 2016-06-23 MED ORDER — IOPAMIDOL (ISOVUE-300) INJECTION 61%
50.0000 mL | Freq: Once | INTRAVENOUS | Status: DC | PRN
Start: 2016-06-23 — End: 2016-06-24

## 2016-06-23 MED ORDER — GLUCAGON HCL RDNA (DIAGNOSTIC) 1 MG IJ SOLR
INTRAMUSCULAR | Status: AC
  Filled 2016-06-23: qty 1

## 2016-06-23 NOTE — Progress Notes (Signed)
Oncology Nurse Navigator Documentation  Following [place], provided PEG Education to Antonio [name] and his sister Antonio [name]. Using  PEG teaching device   and Teach Back, provided education for PEG use and care, including: hand hygiene, gravity bolus administration of daily water flushes, nutritional supplement, fluids and medications; care of tube insertion site including daily dressing change and cleaning; S&S of infection.  They correctly verbalized dressing change and cleaning procedures, provided correct return demonstration of dressing change and gravity administration of water.  I provided written instructions for PEG flushing/dressing change in support of verbal instruction.  They understand I will be available for ongoing PEG educational support.  I described PEG starter kit that will be provided on Friday when he arrives to Radiology on Friday for PEG and PAC placement.  [name], RN, BSN, [place] Neck Oncology Nurse [place] [facility] [place] at [place] [phone]

## 2016-06-23 NOTE — Progress Notes (Signed)
Oncology Nurse Navigator Documentation  Met with Mr. Bozard during [facility].  He was accompanied by his sister.  Provided verbal and written overview of [facility], the clinicians who will be seeing him, encouraged him to ask questions during his time with them.  He was seen by Nutrition, SLP, PT, and SW.  He previously met with [facility] [facility].  Spoke with him at end of [facility], addressed questions. He understands I can be contacted with needs/concerns.  [name], RN, BSN, [place] at [place] [phone]

## 2016-06-23 NOTE — Progress Notes (Signed)
[name] here to see pt.  Went over teaching for g-tube.

## 2016-06-23 NOTE — Discharge Instructions (Signed)
Gastrostomy Tube Replacement, Care After Refer to this sheet in the next few weeks. These instructions provide you with information on caring for yourself after your procedure. Your health care provider may also give you more specific instructions. Your treatment has been planned according to current medical practices, but problems sometimes occur. Call your health care provider if you have any problems or questions after your procedure. What can I expect after the procedure? After your procedure, it is typical to have the following: Mild abdominal pain. A small amount of blood-tinged fluid leaking from the replacement site. Follow these instructions at home: You may resume your normal level of activity. You may resume your normal feedings. Care for your gastrostomy tube as you did before, or as directed by your health care provider. Contact a health care provider if: You have a fever or chills. You have redness or irritation near the insertion site. You continue to have abdominal pain or leaking around your gastrostomy tube. Get help right away if: You develop bleeding or significant discharge around the tube. You have severe abdominal pain. Your new tube does not seem to be working properly. You are unable to get feedings into the tube. Your tube comes out for any reason. This information is not intended to replace advice given to you by your health care provider. Make sure you discuss any questions you have with your health care provider. Document Released: 08/27/2013 Document Revised: 07/08/2015 Document Reviewed: 06/11/2013 Elsevier Interactive Patient Education  2017 [employer] Insertion, Care After This sheet gives you information about how to care for yourself after your procedure. Your health care provider may also give you more specific instructions. If you have problems or questions, contact your health care provider. What can I expect after the  procedure? After your procedure, it is common to have:  Discomfort at the port insertion site.  Bruising on the skin over the port. This should improve over 3-4 days. Follow these instructions at home: [facility] care   After your port is placed, you will get a manufacturer's information card. The card has information about your port. Keep this card with you at all times.  Take care of the port as told by your health care provider. Ask your health care provider if you or a family member can get training for taking care of the port at home. A home health care nurse may also take care of the port.  Make sure to remember what type of port you have. Incision care   Follow instructions from your health care provider about how to take care of your port insertion site. Make sure you:  Wash your hands with soap and water before you change your bandage (dressing). If soap and water are not available, use hand sanitizer.  Change your dressing as told by your health care provider.  Leave stitches (sutures), skin glue, or adhesive strips in place. These skin closures may need to stay in place for 2 weeks or longer. If adhesive strip edges start to loosen and curl up, you may trim the loose edges. Do not remove adhesive strips completely unless your health care provider tells you to do that.  Check your port insertion site every day for signs of infection. Check for:  More redness, swelling, or pain.  More fluid or blood.  Warmth.  Pus or a bad smell. General instructions   Do not take baths, swim, or use a hot tub until your health care provider approves.  Do not lift anything that is heavier than 10 lb (4.5 kg) for a week, or as told by your health care provider.  Ask your health care provider when it is okay to:  Return to work or school.  Resume usual physical activities or sports.  Do not drive for 24 hours if you were given a medicine to help you relax (sedative).  Take  over-the-counter and prescription medicines only as told by your health care provider.  Wear a medical alert bracelet in case of an emergency. This will tell any health care providers that you have a port.  Keep all follow-up visits as told by your health care provider. This is important. Contact a health care provider if:  You cannot flush your port with saline as directed, or you cannot draw blood from the port.  You have a fever or chills.  You have more redness, swelling, or pain around your port insertion site.  You have more fluid or blood coming from your port insertion site.  Your port insertion site feels warm to the touch.  You have pus or a bad smell coming from the port insertion site. Get help right away if:  You have chest pain or shortness of breath.  You have bleeding from your port that you cannot control. Summary  Take care of the port as told by your health care provider.  Change your dressing as told by your health care provider.  Keep all follow-up visits as told by your health care provider. This information is not intended to replace advice given to you by your health care provider. Make sure you discuss any questions you have with your health care provider. Document Released: 11/20/2012 Document Revised: 12/22/2015 Document Reviewed: 12/22/2015 Elsevier Interactive Patient Education  2017 [place]. Moderate Conscious Sedation, Adult, Care After These instructions provide you with information about caring for yourself after your procedure. Your health care provider may also give you more specific instructions. Your treatment has been planned according to current medical practices, but problems sometimes occur. Call your health care provider if you have any problems or questions after your procedure. What can I expect after the procedure? After your procedure, it is common:  To feel sleepy for several hours.  To feel clumsy and have poor balance for  several hours.  To have poor judgment for several hours.  To vomit if you eat too soon. Follow these instructions at home: For at least 24 hours after the procedure:    Do not:  Participate in activities where you could fall or become injured.  Drive.  Use heavy machinery.  Drink alcohol.  Take sleeping pills or medicines that cause drowsiness.  Make important decisions or sign legal documents.  Take care of children on your own.  Rest. Eating and drinking   Follow the diet recommended by your health care provider.  If you vomit:  Drink water, juice, or soup when you can drink without vomiting.  Make sure you have little or no nausea before eating solid foods. General instructions   Have a responsible adult stay with you until you are awake and alert.  Take over-the-counter and prescription medicines only as told by your health care provider.  If you smoke, do not smoke without supervision.  Keep all follow-up visits as told by your health care provider. This is important. Contact a health care provider if:  You keep feeling nauseous or you keep vomiting.  You feel light-headed.  You develop a  rash.  You have a fever. Get help right away if:  You have trouble breathing. This information is not intended to replace advice given to you by your health care provider. Make sure you discuss any questions you have with your health care provider. Document Released: 11/20/2012 Document Revised: 07/05/2015 Document Reviewed: 05/22/2015 Elsevier Interactive Patient Education  2017 [employer].

## 2016-06-23 NOTE — Procedures (Signed)
Laryngeal cancer  Status post right IJ single lumen power port  Tip SVC/RA junction area graft   no immediate complication  EBL 0  Full report in PACs

## 2016-06-23 NOTE — H&P (Signed)
Chief Complaint: throat cancer  Referring Physician:Dr. [name]  Supervising Physician: Daryll Brod  Patient Status: [facility] - Out-pt  HPI: Antonio [name] is a 61 y.o. male who has been diagnosed with throat cancer.  He is being followed by [name].  He is going to start chemoradiation in the near future and we have been asked to place a PAC as well as a g-tube.  He has been feeling well recently.  He is able to eat, but eats essentially full liquids, such as mashed potatoes, yogurt, smoothies, etc.  He is unable to eat things like meat or hard foods.  He otherwise denies trouble swallowing his pills except large capsules.  He presents today for the above two procedures.  Past Medical History:  Past Medical History:  Diagnosis Date  . Allergy    allergic rhinitis  . Arthritis    "qwhere" (05/17/2016)  . Chronic cough   . Chronic sinus infection   . Colonic polyp   . COPD (chronic obstructive pulmonary disease) ([place])   . 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 Dysphagia    normal EGD except for mild gastritis in 01/2006  . GERD (gastroesophageal reflux disease)   . Head and neck malignancy ([place]) 05/18/2016  . High cholesterol   . Hypertension   . Rectal bleeding    colonoscopy in 01/2006  . Rectal polyp    01/2006 with path showing it to be benign, though with a question that it was incomplete biopsy, next scheduled coloscopy 2010  . Sickle cell trait ([place])   . Tobacco abuse   . Varus deformity of knee    bilateral, congenital    Past Surgical History:  Past Surgical History:  Procedure Laterality Date  . COLONOSCOPY W/ BIOPSIES AND POLYPECTOMY  X 2  . MULTIPLE TOOTH EXTRACTIONS  ~ 2008   "took them all out"    Family History:  Family History  Problem Relation Age of Onset  . Cancer Mother        gastric ca  . Hypertension Father   . Diabetes Father   . Diabetes Sister   . CAD Sister     Social History:  reports that he has been  smoking Cigarettes.  He has a 5.16 pack-year smoking history. He has never used smokeless tobacco. He reports that he drinks about 14.4 oz of alcohol per week . He reports that he uses drugs, including "Crack" cocaine and Marijuana.  Allergies: No Known Allergies  Medications: Medications reviewed in epic  Please HPI for pertinent positives, otherwise complete 10 system ROS negative.  Mallampati Score: MD Evaluation Airway: Other (comments) Airway comments: has throat cancer on left side Heart: WNL Abdomen: WNL Chest/ Lungs: WNL ASA  Classification: 2 Mallampati/Airway Score: Two  Physical Exam: BP 127/88 (BP Location: Right Arm)   Pulse (!) 103   Temp 99 F (37.2 C) (Oral)   Resp 18   SpO2 94%  There is no height or weight on file to calculate BMI. General: pleasant, WD, WN black male who is laying in bed in NAD HEENT: head is normocephalic, atraumatic.  Sclera are noninjected.  PERRL.  Ears and nose without any masses or lesions.  Mouth is pink and moist with slurred speech secondary to tongue dysfunction.  Hard mass felt on left lateral neck Heart: regular, rate, and rhythm.  Normal s1,s2. No obvious murmurs, gallops, or rubs noted.  Palpable radial pulses bilaterally Lungs: CTAB,  no wheezes, rhonchi, or rales noted.  Respiratory effort nonlabored Abd: soft, NT, ND, +BS, no masses, hernias, or organomegaly Psych: A&Ox3 with an appropriate affect.   Labs: Results for orders placed or performed during the hospital encounter of 06/23/16 (from the past 48 hour(s))  Basic metabolic panel     Status: Abnormal   Collection Time: 06/23/16  9:54 AM  Result Value Ref Range   Sodium 139 135 - 145 mmol/L   Potassium 3.4 (L) 3.5 - 5.1 mmol/L   Chloride 97 (L) 101 - 111 mmol/L   CO2 30 22 - 32 mmol/L   Glucose, Bld 116 (H) 65 - 99 mg/dL   BUN 9 6 - 20 mg/dL   Creatinine, Ser 1.05 0.61 - 1.24 mg/dL   Calcium 9.4 8.9 - 10.3 mg/dL   GFR calc non Af Amer >60 >60 mL/min   GFR calc Af  Amer >60 >60 mL/min    Comment: (NOTE) The eGFR has been calculated using the CKD EPI equation. This calculation has not been validated in all clinical situations. eGFR's persistently <60 mL/min signify possible Chronic Kidney Disease.    Anion gap 12 5 - 15  CBC with Differential/Platelet     Status: Abnormal   Collection Time: 06/23/16  9:54 AM  Result Value Ref Range   WBC 6.7 4.0 - 10.5 K/uL   RBC 4.93 4.22 - 5.81 MIL/uL   Hemoglobin 14.0 13.0 - 17.0 g/dL   HCT 41.2 39.0 - 52.0 %   MCV 83.6 78.0 - 100.0 fL   MCH 28.4 26.0 - 34.0 pg   MCHC 34.0 30.0 - 36.0 g/dL   RDW 13.9 11.5 - 15.5 %   Platelets 411 (H) 150 - 400 K/uL   Neutrophils Relative % 62 %   Neutro Abs 4.2 1.7 - 7.7 K/uL   Lymphocytes Relative 23 %   Lymphs Abs 1.5 0.7 - 4.0 K/uL   Monocytes Relative 13 %   Monocytes Absolute 0.9 0.1 - 1.0 K/uL   Eosinophils Relative 2 %   Eosinophils Absolute 0.1 0.0 - 0.7 K/uL   Basophils Relative 0 %   Basophils Absolute 0.0 0.0 - 0.1 K/uL    Imaging: No results found.  Assessment/Plan 1. Throat cancer We will plan to proceed today with placement of a PAC as well as a g-tube.  I have discussed with the patient the possibility of a pull through vs a push technique for g-tube placement and reasoning for both.  He understands.  Labs and vitals have been reviewed. Risks and Benefits discussed with the patient including, but not limited to bleeding, infection, pneumothorax, or fibrin sheath development and need for additional procedures. All of the patient's questions were answered, patient is agreeable to proceed. Consent signed and in chart. Risks and Benefits discussed with the patient including, but not limited to the need for a barium enema during the procedure, bleeding, infection, peritonitis, or damage to adjacent structures. All of the patient's questions were answered, patient is agreeable to proceed. Consent signed and in chart.   Thank you for this interesting  consult.  I greatly enjoyed meeting Antonio [name] and look forward to participating in their care.  A copy of this report was sent to the requesting provider on this date.  Electronically Signed: Henreitta Cea 06/23/2016, 10:40 AM   I spent a total of  30 Minutes   in face to face in clinical consultation, greater than 50% of which was counseling/coordinating care for throat cancer

## 2016-06-27 ENCOUNTER — Ambulatory Visit: Payer: Medicare Other

## 2016-06-27 ENCOUNTER — Telehealth: Payer: Self-pay | Admitting: *Deleted

## 2016-06-27 ENCOUNTER — Other Ambulatory Visit: Payer: Self-pay

## 2016-06-27 NOTE — Telephone Encounter (Signed)
Oncology Nurse Navigator Documentation  Called Antonio [name] to check on missed lab and [facility] RT this afternoon.  He stated he was unaware of appts, thought he was to come tomorrow. He understands I have re-scheduled today's lab for 12:00 tomorrow which will precede remaining appts for the afternoon.  [name], RN, BSN, [place] [facility] Nurse [place] [place] [phone]

## 2016-06-28 ENCOUNTER — Encounter: Payer: Self-pay | Admitting: *Deleted

## 2016-06-28 ENCOUNTER — Ambulatory Visit
Admission: RE | Admit: 2016-06-28 | Discharge: 2016-06-28 | Disposition: A | Discharge status: Home / Self Care | Payer: Medicare Other | Source: Ambulatory Visit | Attending: Radiation Oncology | Admitting: Radiation Oncology

## 2016-06-28 ENCOUNTER — Inpatient Hospital Stay
Admission: RE | Admit: 2016-06-28 | Discharge: 2016-06-28 | Disposition: A | Discharge status: Home / Self Care | Payer: Self-pay | Source: Ambulatory Visit | Attending: Radiation Oncology | Admitting: Radiation Oncology

## 2016-06-28 ENCOUNTER — Other Ambulatory Visit (HOSPITAL_BASED_OUTPATIENT_CLINIC_OR_DEPARTMENT_OTHER): Payer: Medicare Other

## 2016-06-28 ENCOUNTER — Ambulatory Visit (HOSPITAL_BASED_OUTPATIENT_CLINIC_OR_DEPARTMENT_OTHER): Payer: Medicare Other | Admitting: Hematology and Oncology

## 2016-06-28 DIAGNOSIS — F17200 Nicotine dependence, unspecified, uncomplicated: Secondary | ICD-10-CM

## 2016-06-28 DIAGNOSIS — C3431 Malignant neoplasm of lower lobe, right bronchus or lung: Secondary | ICD-10-CM | POA: Insufficient documentation

## 2016-06-28 DIAGNOSIS — R634 Abnormal weight loss: Secondary | ICD-10-CM

## 2016-06-28 DIAGNOSIS — C321 Malignant neoplasm of supraglottis: Secondary | ICD-10-CM

## 2016-06-28 DIAGNOSIS — G893 Neoplasm related pain (acute) (chronic): Secondary | ICD-10-CM

## 2016-06-28 DIAGNOSIS — E7800 Pure hypercholesterolemia, unspecified: Secondary | ICD-10-CM | POA: Diagnosis not present

## 2016-06-28 DIAGNOSIS — Z720 Tobacco use: Secondary | ICD-10-CM | POA: Diagnosis not present

## 2016-06-28 DIAGNOSIS — Z510 Encounter for antineoplastic radiation therapy: Secondary | ICD-10-CM | POA: Diagnosis not present

## 2016-06-28 DIAGNOSIS — K5900 Constipation, unspecified: Secondary | ICD-10-CM | POA: Diagnosis not present

## 2016-06-28 DIAGNOSIS — I10 Essential (primary) hypertension: Secondary | ICD-10-CM | POA: Diagnosis not present

## 2016-06-28 DIAGNOSIS — K219 Gastro-esophageal reflux disease without esophagitis: Secondary | ICD-10-CM | POA: Diagnosis not present

## 2016-06-28 LAB — CBC WITH DIFFERENTIAL/PLATELET
BASO%: 0.7 % (ref 0.0–2.0)
BASOS ABS: 0.1 10*3/uL (ref 0.0–0.1)
EOS%: 2.3 % (ref 0.0–7.0)
Eosinophils Absolute: 0.2 10*3/uL (ref 0.0–0.5)
HEMATOCRIT: 42.5 % (ref 38.4–49.9)
HEMOGLOBIN: 14.2 g/dL (ref 13.0–17.1)
LYMPH%: 14.8 % (ref 14.0–49.0)
MCH: 27.7 pg (ref 27.2–33.4)
MCHC: 33.3 g/dL (ref 32.0–36.0)
MCV: 83.2 fL (ref 79.3–98.0)
MONO#: 0.9 10*3/uL (ref 0.1–0.9)
MONO%: 12.2 % (ref 0.0–14.0)
NEUT#: 5.3 10*3/uL (ref 1.5–6.5)
NEUT%: 70 % (ref 39.0–75.0)
PLATELETS: 391 10*3/uL (ref 140–400)
RBC: 5.11 10*6/uL (ref 4.20–5.82)
RDW: 14.4 % (ref 11.0–14.6)
WBC: 7.5 10*3/uL (ref 4.0–10.3)
lymph#: 1.1 10*3/uL (ref 0.9–3.3)

## 2016-06-28 LAB — COMPREHENSIVE METABOLIC PANEL
ALT: 24 U/L (ref 0–55)
AST: 23 U/L (ref 5–34)
Albumin: 3.8 g/dL (ref 3.5–5.0)
Alkaline Phosphatase: 77 U/L (ref 40–150)
Anion Gap: 9 mEq/L (ref 3–11)
BUN: 6.3 mg/dL — AB (ref 7.0–26.0)
CALCIUM: 10.4 mg/dL (ref 8.4–10.4)
CHLORIDE: 96 meq/L — AB (ref 98–109)
CO2: 32 mEq/L — ABNORMAL HIGH (ref 22–29)
Creatinine: 1 mg/dL (ref 0.7–1.3)
EGFR: >90 mL/min/{1.73_m2} (ref >90)
GLUCOSE: 117 mg/dL (ref 70–140)
POTASSIUM: 3.6 meq/L (ref 3.5–5.1)
SODIUM: 138 meq/L (ref 136–145)
Total Bilirubin: 0.67 mg/dL (ref 0.20–1.20)
Total Protein: 7.7 g/dL (ref 6.4–8.3)

## 2016-06-28 LAB — MAGNESIUM: MAGNESIUM: 2.4 mg/dL (ref 1.5–2.5)

## 2016-06-28 MED ORDER — MORPHINE SULFATE 15 MG PO TABS: 15.0000 mg | ORAL_TABLET | Freq: Four times a day (QID) | ORAL | 0 refills | Status: DC | PRN

## 2016-06-28 MED ORDER — BIAFINE EX EMUL
CUTANEOUS | Status: DC | PRN
Start: 2016-06-28 — End: 2016-07-05
  Administered 2016-06-28: 16:00:00 via TOPICAL

## 2016-06-28 MED FILL — LORazepam 0.5 MG TABS: 0.5 | 35 days supply | Qty: 35 | Fill #0

## 2016-06-28 MED FILL — MORPHINE SULFATE IR 15 MG T: 15 | 15 days supply | Qty: 60 | Fill #0

## 2016-06-28 NOTE — Progress Notes (Signed)

## 2016-06-28 NOTE — Progress Notes (Signed)
  Radiation Oncology         [phone]) [phone] ________________________________  Name: Antonio [name] MRN: [id number]  Date: 06/28/2016  DOB: 06-05-1955  4DCT COMPLEX SIMULATION / TREATMENT PLANNING NOTE / SPECIAL TREATMENT PROCEDURE  Outpatient    ICD-9-CM ICD-10-CM   1. Malignant neoplasm of lower lobe of right lung ([place]) 162.5 C34.31     The patient was positioned on the CT simulator in a complex treatment device custom fitted to their body: A body fix blue bag with vacuum compression. The patient's head was in an Accuform.  The patient's arms were over their head. An abdominal compression device (pillow/plastic wrap/vacuum) was snugly fitted to decrease the patient's intrathoracic movements.  RESPIRATORY MOTION MANAGEMENT SIMULATION  NARRATIVE:  In order to account for effect of respiratory motion on target structures and other organs in the planning and delivery of radiotherapy, this patient underwent respiratory motion management simulation.  To accomplish this, when the patient was brought to the CT simulation planning suite, 4D respiratory motion management CT images were obtained.  The CT images were loaded into the planning software.  Then, using a variety of tools including Cine, MIP, and standard views, the target volume and planning target volumes (PTV) were delineated.  Avoidance structures were contoured.  Treatment planning then occurred.  Dose volume histograms will be generated and reviewed for each of the requested structure.  I contoured the patient's ITV and increased ITV with tight margins for the PTV. I will prescribe 60 Gy in 5 fractions of 12 Gy per fraction every other day to the right lower lung tumor. I requested a DVH of the patient's lungs, target volumes, esophagus, heart, spinal cord and airways for 3D conformal planning.  Cone beam CT scans will be performed prior to each fraction to allow close PTV margins and sparing of normal tissues from high doses.  SPECIAL  TREATMENT PROCEDURE NOTE:   This constitutes a special treatment procedure due to the ablative dose delivered and the technical nature of treatment.  This highly technical modality of treatment ensures that the ablative dose is centered on the patient's tumor while sparing normal tissues from excessive dose and risk of detrimental effects. -----------------------------------  [name], MD

## 2016-06-28 NOTE — Progress Notes (Addendum)
A user error has taken place: charting intended for 5/11, has been corrected.

## 2016-06-29 ENCOUNTER — Encounter: Payer: Self-pay | Admitting: Hematology and Oncology

## 2016-06-29 ENCOUNTER — Ambulatory Visit
Admission: RE | Admit: 2016-06-29 | Discharge: 2016-06-29 | Disposition: A | Discharge status: Home / Self Care | Payer: Medicare Other | Source: Ambulatory Visit | Attending: Radiation Oncology | Admitting: Radiation Oncology

## 2016-06-29 DIAGNOSIS — E7800 Pure hypercholesterolemia, unspecified: Secondary | ICD-10-CM | POA: Diagnosis not present

## 2016-06-29 DIAGNOSIS — C321 Malignant neoplasm of supraglottis: Secondary | ICD-10-CM | POA: Diagnosis not present

## 2016-06-29 DIAGNOSIS — C3431 Malignant neoplasm of lower lobe, right bronchus or lung: Secondary | ICD-10-CM | POA: Diagnosis not present

## 2016-06-29 DIAGNOSIS — Z510 Encounter for antineoplastic radiation therapy: Secondary | ICD-10-CM | POA: Diagnosis not present

## 2016-06-29 DIAGNOSIS — I10 Essential (primary) hypertension: Secondary | ICD-10-CM | POA: Diagnosis not present

## 2016-06-29 DIAGNOSIS — K5900 Constipation, unspecified: Secondary | ICD-10-CM | POA: Diagnosis not present

## 2016-06-29 DIAGNOSIS — K219 Gastro-esophageal reflux disease without esophagitis: Secondary | ICD-10-CM | POA: Diagnosis not present

## 2016-06-29 NOTE — Assessment & Plan Note (Signed)
Due to anorexia from cancer Recommend increase oral intake as tolerated and will refer him to see dietitian

## 2016-06-29 NOTE — Progress Notes (Signed)
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 LEFT neck lymphadenopathy including LEFT level IIa to level 3 4.7 x 4.6 x 7.6 cm necrotic nodal conglomeration with suspected extracapsular spread of disease. Moderately narrowed hypopharynx.       05/18/2016 Procedure    Technically successful ultrasound-guided core biopsy of left cervical adenopathy.       05/18/2016 Pathology Results    Lymph node, needle/core biopsy, L cerv LAN - SQUAMOUS CELL CARCINOMA, SEE COMMENT. Microscopic Comment Residual lymph node is not seen. There is tumor invading skeletal muscle. p16 weakly positive      06/06/2016 Procedure    He had dental extraction      06/08/2016 PET scan    1. 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 2. Hypermetabolic right lower lobe pulmonary nodule is favored to represent a synchronous primary bronchogenic neoplasm. Isolated pulmonary metastasis could look similar. No thoracic nodal hypermetabolism. 3. Soft tissue hypermetabolism about the right iliac bone and greater trochanter of the proximal right femur are favored to be degenerative or posttraumatic. Soft tissue metastasis felt highly unlikely. 4.  Coronary artery atherosclerosis.  Aortic atherosclerosis. 5. A tiny right lower lobe pulmonary nodule is most likely a subpleural lymph node.      06/15/2016 Procedure    He has near normal baseline hearing test      06/23/2016 Procedure    Ultrasound and fluoroscopically guided right internal jugular single lumen power port catheter insertion. Tip in the SVC/RA junction. Catheter ready for use      06/23/2016 Procedure    Fluoroscopic insertion of a 20-French "pull-through" gastrostomy.       INTERVAL HISTORY: Please see below for problem oriented charting. He returns with his sister prior to initiation of chemotherapy. He had minimum pain from recent procedures. He has quit alcohol intake. He is attempting to quit smoking, down to 1 cigarettes per day. He has lost some weight due to recent fasting states. The pain on the left side of his neck is much improved with morphine sulfate for pain control.  REVIEW OF SYSTEMS:   Constitutional: Denies fevers, chills  Eyes: Denies blurriness of vision Ears, nose, mouth, throat, and face: Denies mucositis or sore throat Respiratory: Denies cough, dyspnea or wheezes Cardiovascular: Denies palpitation, chest discomfort or lower extremity swelling Gastrointestinal:  Denies nausea, heartburn or change in bowel habits Skin: Denies abnormal skin rashes Lymphatics: Denies new lymphadenopathy or easy bruising Neurological:Denies numbness, tingling or new weaknesses Behavioral/Psych: Mood is stable, no new changes  All other systems were reviewed with the patient and are negative.  I have reviewed the past medical history, past surgical history, social history and family history with the patient and they are unchanged from previous note.  ALLERGIES:  has No Known Allergies.  MEDICATIONS:  Current Outpatient Prescriptions  Medication Sig Dispense Refill  .  atorvastatin (LIPITOR) 40 MG tablet Take 1 tablet (40 mg total) by mouth daily. 30 tablet 2  . cyclobenzaprine (FLEXERIL)  10 MG tablet Take 1 tablet (10 mg total) by mouth at bedtime. 30 tablet 1  . gabapentin (NEURONTIN) 300 MG capsule Take 1 capsule (300 mg total) by mouth 3 (three) times daily. 90 capsule 3  . loratadine (CLARITIN) 10 MG tablet TAKE ONE TABLET BY MOUTH ONCE DAILY 90 tablet 3  . LORazepam (ATIVAN) 0.5 MG tablet Take one tablet 20-30 minutes before radiotherapy PRN anxiety/ claustrophobia. 35 tablet 0  . morphine (MSIR) 15 MG tablet Take 1 tablet (15 mg total) by mouth every 6 (six) hours as needed for severe pain. 60 tablet 0  . diclofenac sodium (VOLTAREN) 1 % GEL Apply 2 g topically 4 (four) times daily. (Patient not taking: Reported on 05/17/2016) 1 Tube 2  . emollient (BIAFINE) cream Apply topically as needed.    . lidocaine-prilocaine (EMLA) cream Apply to affected area once (Patient not taking: Reported on 06/28/2016) 30 g 3  . ondansetron (ZOFRAN) 8 MG tablet Take 1 tablet (8 mg total) by mouth 2 (two) times daily as needed. Start on the third day after chemotherapy. (Patient not taking: Reported on 06/28/2016) 30 tablet 1  . prochlorperazine (COMPAZINE) 10 MG tablet Take 1 tablet (10 mg total) by mouth every 6 (six) hours as needed (Nausea or vomiting). (Patient not taking: Reported on 06/28/2016) 30 tablet 1  . senna-docusate (SENOKOT-S) 8.6-50 MG tablet Take 1 tablet by mouth 2 (two) times daily. (Patient not taking: Reported on 06/28/2016) 60 tablet 3   No current facility-administered medications for this visit.    Facility-Administered Medications Ordered in Other Visits  Medication Dose Route Frequency Provider Last Rate Last Dose  . topical emolient (BIAFINE) emulsion   Topical PRN [name], MD        PHYSICAL EXAMINATION: ECOG PERFORMANCE STATUS: 1 - Symptomatic but completely ambulatory  Vitals:   06/28/16 1218  BP: 133/76  Pulse: 93  Resp: 18  Temp: 98.9 F (37.2 C)   Filed Weights   06/28/16 1218  Weight: 161 lb 9.6 oz (73.3 kg)    GENERAL:alert, no distress and  comfortable SKIN: skin color, texture, turgor are normal, no rashes or significant lesions EYES: normal, Conjunctiva are pink and non-injected, sclera clear OROPHARYNX:no exudate, no erythema and lips, buccal mucosa, and tongue normal  NECK: supple, thyroid normal size, non-tender, without nodularity LYMPH: Persistent large neck lymphadenopathy on the left side LUNGS: clear to auscultation and percussion with normal breathing effort HEART: regular rate & rhythm and no murmurs and no lower extremity edema ABDOMEN:abdomen soft, non-tender and normal bowel sounds Musculoskeletal:no cyanosis of digits and no clubbing  NEURO: alert & oriented x 3 with fluent speech, no focal motor/sensory deficits  LABORATORY DATA:  I have reviewed the data as listed    Component Value Date/Time   NA 138 06/28/2016 1204   K 3.6 06/28/2016 1204   CL 97 (L) 06/23/2016 0954   CO2 32 (H) 06/28/2016 1204   GLUCOSE 117 06/28/2016 1204   BUN 6.3 (L) 06/28/2016 1204   CREATININE 1.0 06/28/2016 1204   CALCIUM 10.4 06/28/2016 1204   PROT 7.7 06/28/2016 1204   ALBUMIN 3.8 06/28/2016 1204   AST 23 06/28/2016 1204   ALT 24 06/28/2016 1204   ALKPHOS 77 06/28/2016 1204   BILITOT 0.67 06/28/2016 1204   GFRNONAA >60 06/23/2016 0954   GFRAA >60 06/23/2016 0954    No  results found for: SPEP, UPEP  Lab Results  Component Value Date   WBC 7.5 06/28/2016   NEUTROABS 5.3 06/28/2016   HGB 14.2 06/28/2016   HCT 42.5 06/28/2016   MCV 83.2 06/28/2016   PLT 391 06/28/2016      Chemistry      Component Value Date/Time   NA 138 06/28/2016 1204   K 3.6 06/28/2016 1204   CL 97 (L) 06/23/2016 0954   CO2 32 (H) 06/28/2016 1204   BUN 6.3 (L) 06/28/2016 1204   CREATININE 1.0 06/28/2016 1204      Component Value Date/Time   CALCIUM 10.4 06/28/2016 1204   ALKPHOS 77 06/28/2016 1204   AST 23 06/28/2016 1204   ALT 24 06/28/2016 1204   BILITOT 0.67 06/28/2016 1204       RADIOGRAPHIC STUDIES: I have personally  reviewed the radiological images as listed and agreed with the findings in the report. Ir Gastrostomy Tube Mod Sed  Result Date: 06/23/2016 INDICATION: Laryngeal cancer EXAM: TWENTY FRENCH PULL-THROUGH GASTROSTOMY Date:  5/11/20185/12/2016 12:17 pm Radiologist:  M. Daryll Brod, MD Guidance:  Fluoroscopic MEDICATIONS: Ancef 2 g; Antibiotics were administered within 1 hour of the procedure. Glucagon 0.5 mg IV ANESTHESIA/SEDATION: Versed 1.0 mg IV; Fentanyl 25 mcg IV Moderate Sedation Time:  10 minutes The patient was continuously monitored during the procedure by the interventional radiology nurse under my direct supervision. CONTRAST:  10 cc Isovue-300 - administered into the gastric lumen. FLUOROSCOPY TIME:  Fluoroscopy Time: 2 minutes 48 seconds (56 mGy). COMPLICATIONS: None immediate. PROCEDURE: Informed consent was obtained from the patient following explanation of the procedure, risks, benefits and alternatives. The patient understands, agrees and consents for the procedure. All questions were addressed. A time out was performed. Maximal barrier sterile technique utilized including caps, mask, sterile gowns, sterile gloves, large sterile drape, hand hygiene, and betadine prep. The left upper quadrant was sterilely prepped and draped. An oral gastric catheter was inserted into the stomach under fluoroscopy. The existing nasogastric feeding tube was removed. Air was injected into the stomach for insufflation and visualization under fluoroscopy. The air distended stomach was confirmed beneath the anterior abdominal wall in the frontal and lateral projections. Under sterile conditions and local anesthesia, a 36 gauge trocar needle was utilized to access the stomach percutaneously beneath the left subcostal margin. Needle position was confirmed within the stomach under biplane fluoroscopy. Contrast injection confirmed position also. A single T tack was deployed for gastropexy. Over an Amplatz guide wire, a  9-French sheath was inserted into the stomach. A snare device was utilized to capture the oral gastric catheter. The snare device was pulled retrograde from the stomach up the esophagus and out the oropharynx. The 20-French pull-through gastrostomy was connected to the snare device and pulled antegrade through the oropharynx down the esophagus into the stomach and then through the percutaneous tract external to the patient. The gastrostomy was assembled externally. Contrast injection confirms position in the stomach. Images were obtained for documentation. The patient tolerated procedure well. No immediate complication. IMPRESSION: Fluoroscopic insertion of a 20-French "pull-through" gastrostomy. Electronically Signed   By: Jerilynn Mages.  Shick M.D.   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 EXAM: NUCLEAR MEDICINE PET SKULL BASE TO THIGH TECHNIQUE: 114 mCi F-18 FDG was injected intravenously. Full-ring PET imaging was performed from the skull base to thigh after the radiotracer. CT data  was obtained and used for attenuation correction and anatomic localization. FASTING BLOOD GLUCOSE:  Value: 8.5 mg/dl COMPARISON:  Neck CT 05/17/2016 FINDINGS: NECK Hypermetabolism corresponding to the left supraglottic laryngeal mass. This measures a S.U.V. max of 20.6, including on image 33/series 4. Probable extension across the midline, with early focus of hypermetabolism in the region of the right posterior larynx measuring a S.U.V. max of 7.1, including on approximately image 37/series 4. The dominant left level 2-3 nodal mass demonstrates hypermetabolism and central necrosis. This measures a S.U.V. max of 16.6 on image 27/series 4. There are small but hypermetabolic right-sided level 2 nodes, including at 7 mm and a S.U.V. max of 9.5 on image 34/series 4. Left supraclavicular hypermetabolic adenopathy,  with an index node measuring 9 mm and a S.U.V. max of 9.0 on image 43/series 4. Other neck findings deferred to prior diagnostic CT. Mass-effect upon the airway persists. CHEST No other thoracic nodal hypermetabolism. A spiculated right lower lobe pulmonary nodule measures 1.9 x 1.8 cm and a S.U.V. max of 8.1 on image 43/series 8. 2 mm subpleural right lower lobe nodule on image 52/series 8. Mild cardiomegaly. Anterior pericardial thickening is not hypermetabolic. Coronary artery atherosclerosis. ABDOMEN/PELVIS No abdominopelvic nodal or parenchymal hypermetabolism identified. Normal adrenal glands. A subtle pericholecystic right liver lobe 13 mm lesion is suspected on image 105/series 4. Not hypermetabolic. Abdominal aortic atherosclerosis. Mild prostatomegaly. SKELETON Focus of hypermetabolism about the anterior right iliac bone is felt to correspond to degenerative osseous irregularity. This measures a S.U.V. max of 4.0 including on image 157/series 4. A focus of soft tissue hypermetabolism adjacent the greater trochanter of the right femur is without osseous or soft tissue correlate. Degenerate change of both glenohumeral joints. IMPRESSION: 1. 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 2. Hypermetabolic right lower lobe pulmonary nodule is favored to represent a synchronous primary bronchogenic neoplasm. Isolated pulmonary metastasis could look similar. No thoracic nodal hypermetabolism. 3. Soft tissue hypermetabolism about the right iliac bone and greater trochanter of the proximal right femur are favored to be degenerative or posttraumatic. Soft tissue metastasis felt highly unlikely. 4.  Coronary artery atherosclerosis. Aortic atherosclerosis. 5. A tiny right lower lobe pulmonary nodule is most likely a subpleural lymph node. Electronically Signed   By: Abigail Miyamoto M.D.   On: 06/08/2016 16:51   Ir US Guide Vasc Access Right  Result  Date: 06/23/2016 CLINICAL DATA:  Laryngeal cancer EXAM: RIGHT INTERNAL JUGULAR SINGLE LUMEN POWER PORT CATHETER INSERTION Date:  5/11/20185/12/2016 12:14 pm Radiologist:  M. Daryll Brod, MD Guidance:  Ultrasound and fluoroscopic MEDICATIONS: Ancef 2 g; The antibiotic was administered within an appropriate time interval prior to skin puncture. ANESTHESIA/SEDATION: Versed 2.0 mg IV; Fentanyl 50 mcg IV; Moderate Sedation Time:  25 minutes The patient was continuously monitored during the procedure by the interventional radiology nurse under my direct supervision. FLUOROSCOPY TIME:  36 seconds (6 mGy) COMPLICATIONS: None immediate. CONTRAST:  None. PROCEDURE: Informed consent was obtained from the patient following explanation of the procedure, risks, benefits and alternatives. The patient understands, agrees and consents for the procedure. All questions were addressed. A time out was performed. Maximal barrier sterile technique utilized including caps, mask, sterile gowns, sterile gloves, large sterile drape, hand hygiene, and 2% chlorhexidine scrub. Under sterile conditions and local anesthesia, right internal jugular micropuncture venous access was performed. Access was performed with ultrasound. Images were obtained for documentation. A guide wire was inserted followed by a transitional dilator. This  allowed insertion of a guide wire and catheter into the IVC. Measurements were obtained from the SVC / RA junction back to the right IJ venotomy site. In the right infraclavicular chest, a subcutaneous pocket was created over the second anterior rib. This was done under sterile conditions and local anesthesia. 1% lidocaine with epinephrine was utilized for this. A 2.5 cm incision was made in the skin. Blunt dissection was performed to create a subcutaneous pocket over the right pectoralis major muscle. The pocket was flushed with saline vigorously. There was adequate hemostasis. The port catheter was assembled and  checked for leakage. The port catheter was secured in the pocket with two retention sutures. The tubing was tunneled subcutaneously to the right venotomy site and inserted into the SVC/RA junction through a valved peel-away sheath. Position was confirmed with fluoroscopy. Images were obtained for documentation. The patient tolerated the procedure well. No immediate complications. Incisions were closed in a two layer fashion with 4 - 0 Vicryl suture. Dermabond was applied to the skin. The port catheter was accessed, blood was aspirated followed by saline and heparin flushes. Needle was removed. A dry sterile dressing was applied. IMPRESSION: Ultrasound and fluoroscopically guided right internal jugular single lumen power port catheter insertion. Tip in the SVC/RA junction. Catheter ready for use. Electronically Signed   By: Jerilynn Mages.  Shick M.D.   On: 06/23/2016 12:25   Ir Fluoro Guide Port Insertion Right  Result Date: 06/23/2016 CLINICAL DATA:  Laryngeal cancer EXAM: RIGHT INTERNAL JUGULAR SINGLE LUMEN POWER PORT CATHETER INSERTION Date:  5/11/20185/12/2016 12:14 pm Radiologist:  M. Daryll Brod, MD Guidance:  Ultrasound and fluoroscopic MEDICATIONS: Ancef 2 g; The antibiotic was administered within an appropriate time interval prior to skin puncture. ANESTHESIA/SEDATION: Versed 2.0 mg IV; Fentanyl 50 mcg IV; Moderate Sedation Time:  25 minutes The patient was continuously monitored during the procedure by the interventional radiology nurse under my direct supervision. FLUOROSCOPY TIME:  36 seconds (6 mGy) COMPLICATIONS: None immediate. CONTRAST:  None. PROCEDURE: Informed consent was obtained from the patient following explanation of the procedure, risks, benefits and alternatives. The patient understands, agrees and consents for the procedure. All questions were addressed. A time out was performed. Maximal barrier sterile technique utilized including caps, mask, sterile gowns, sterile gloves, large sterile drape,  hand hygiene, and 2% chlorhexidine scrub. Under sterile conditions and local anesthesia, right internal jugular micropuncture venous access was performed. Access was performed with ultrasound. Images were obtained for documentation. A guide wire was inserted followed by a transitional dilator. This allowed insertion of a guide wire and catheter into the IVC. Measurements were obtained from the SVC / RA junction back to the right IJ venotomy site. In the right infraclavicular chest, a subcutaneous pocket was created over the second anterior rib. This was done under sterile conditions and local anesthesia. 1% lidocaine with epinephrine was utilized for this. A 2.5 cm incision was made in the skin. Blunt dissection was performed to create a subcutaneous pocket over the right pectoralis major muscle. The pocket was flushed with saline vigorously. There was adequate hemostasis. The port catheter was assembled and checked for leakage. The port catheter was secured in the pocket with two retention sutures. The tubing was tunneled subcutaneously to the right venotomy site and inserted into the SVC/RA junction through a valved peel-away sheath. Position was confirmed with fluoroscopy. Images were obtained for documentation. The patient tolerated the procedure well. No immediate complications. Incisions were closed in a two layer fashion with 4 - 0  Vicryl suture. Dermabond was applied to the skin. The port catheter was accessed, blood was aspirated followed by saline and heparin flushes. Needle was removed. A dry sterile dressing was applied. IMPRESSION: Ultrasound and fluoroscopically guided right internal jugular single lumen power port catheter insertion. Tip in the SVC/RA junction. Catheter ready for use. Electronically Signed   By: Jerilynn Mages.  Shick M.D.   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 The intent is of curative intent.  We discussed some of  the risks, benefits, side-effects of cisplatin  Some of the short term side-effects included, though not limited to, including weight loss, life threatening infections, risk of allergic reactions, need for transfusions of blood products, nausea, vomiting, change in bowel habits, loss of hair, admission to hospital for various reasons, and risks of death.   Long term side-effects are also discussed including risks of infertility, permanent damage to nerve function, hearing loss, chronic fatigue, kidney damage with possibility needing hemodialysis, and rare secondary malignancy including bone marrow disorders.  The patient is aware that the response rates discussed earlier is not guaranteed.  After a long discussion, patient made an informed decision to proceed with the prescribed plan of care.   Patient education material was dispensed. He will get weekly blood draw and I will see him on a weekly basis for supportive care   Cancer associated pain We discussed about the role of pain management in the oncology clinic. The prescribed pain regimen is for short term use only.  After the patient has completed the prescribed chemotherapy treatment, the pain medications will be slowly tapered off except for terminally ill patients who may have to remain on pain medications long term due to their terminal cancer.  The patient agreed to be compliant with prescribed pain regimen and promised not to share the prescribed medications. Any lost medications or missed prescription will not be refilled sooner than anticipated time when the patient's prescription is expected to run out. We also discussed narcotics refill policy in the clinic.  The patient is educated to check the pill bottles in the middle of the week and ensure there is adequate supply to last through the weekend until next appointment or available business day.  The oncology service has a strict policy not to refill pain medications after business hours  or the weekend.  If the patient is found to have violated the verbal agreement as stated no further pain medications will be prescribed in the future. I refill his prescription morphine sulfate.   TOBACCO ABUSE I spent some time counseling the patient the importance of tobacco cessation. We discussed common strategies including nicotine patches, Tobacco Quit-line, and other nicotine replacement products to assist in hiseffort to quit  he appears motivated to quit on his own He is down to 1 cigarettes per day.  Weight loss Due to anorexia from cancer Recommend increase oral intake as tolerated and will refer him to see dietitian   No orders of the defined types were placed in this encounter.  All questions were answered. The patient knows to call the clinic with any problems, questions or concerns. No barriers to learning was detected. I spent 30 minutes counseling the patient face to face. The total time spent in the appointment was 40 minutes and more than 50% was on counseling and review of test results     [name], MD 06/29/2016 7:01 AM

## 2016-06-29 NOTE — Assessment & Plan Note (Signed)
We discussed about the role of pain management in the oncology clinic. The prescribed pain regimen is for short term use only.  After the patient has completed the prescribed chemotherapy treatment, the pain medications will be slowly tapered off except for terminally ill patients who may have to remain on pain medications long term due to their terminal cancer.  The patient agreed to be compliant with prescribed pain regimen and promised not to share the prescribed medications. Any lost medications or missed prescription will not be refilled sooner than anticipated time when the patient's prescription is expected to run out. We also discussed narcotics refill policy in the clinic.  The patient is educated to check the pill bottles in the middle of the week and ensure there is adequate supply to last through the weekend until next appointment or available business day.  The oncology service has a strict policy not to refill pain medications after business hours or the weekend.  If the patient is found to have violated the verbal agreement as stated no further pain medications will be prescribed in the future. I refill his prescription morphine sulfate.

## 2016-06-29 NOTE — Assessment & Plan Note (Signed)
We discussed the role of chemotherapy. The intent is of curative intent.  We discussed some of the risks, benefits, side-effects of cisplatin  Some of the short term side-effects included, though not limited to, including weight loss, life threatening infections, risk of allergic reactions, need for transfusions of blood products, nausea, vomiting, change in bowel habits, loss of hair, admission to hospital for various reasons, and risks of death.   Long term side-effects are also discussed including risks of infertility, permanent damage to nerve function, hearing loss, chronic fatigue, kidney damage with possibility needing hemodialysis, and rare secondary malignancy including bone marrow disorders.  The patient is aware that the response rates discussed earlier is not guaranteed.  After a long discussion, patient made an informed decision to proceed with the prescribed plan of care.   Patient education material was dispensed. He will get weekly blood draw and I will see him on a weekly basis for supportive care

## 2016-06-29 NOTE — Assessment & Plan Note (Signed)
I spent some time counseling the patient the importance of tobacco cessation. We discussed common strategies including nicotine patches, Tobacco Quit-line, and other nicotine replacement products to assist in hiseffort to quit  he appears motivated to quit on his own He is down to 1 cigarettes per day.

## 2016-06-30 ENCOUNTER — Ambulatory Visit (HOSPITAL_BASED_OUTPATIENT_CLINIC_OR_DEPARTMENT_OTHER): Payer: Medicare Other

## 2016-06-30 ENCOUNTER — Other Ambulatory Visit: Payer: Self-pay | Admitting: *Deleted

## 2016-06-30 ENCOUNTER — Ambulatory Visit
Admission: RE | Admit: 2016-06-30 | Discharge: 2016-06-30 | Disposition: A | Discharge status: Home / Self Care | Payer: Medicare Other | Source: Ambulatory Visit | Attending: Radiation Oncology | Admitting: Radiation Oncology

## 2016-06-30 VITALS — BP 148/89 | HR 98 | Temp 98.8°F | Resp 18

## 2016-06-30 DIAGNOSIS — C3431 Malignant neoplasm of lower lobe, right bronchus or lung: Secondary | ICD-10-CM | POA: Diagnosis not present

## 2016-06-30 DIAGNOSIS — K219 Gastro-esophageal reflux disease without esophagitis: Secondary | ICD-10-CM | POA: Diagnosis not present

## 2016-06-30 DIAGNOSIS — Z5111 Encounter for antineoplastic chemotherapy: Secondary | ICD-10-CM

## 2016-06-30 DIAGNOSIS — K5900 Constipation, unspecified: Secondary | ICD-10-CM | POA: Diagnosis not present

## 2016-06-30 DIAGNOSIS — Z510 Encounter for antineoplastic radiation therapy: Secondary | ICD-10-CM | POA: Diagnosis not present

## 2016-06-30 DIAGNOSIS — E7800 Pure hypercholesterolemia, unspecified: Secondary | ICD-10-CM | POA: Diagnosis not present

## 2016-06-30 DIAGNOSIS — C321 Malignant neoplasm of supraglottis: Secondary | ICD-10-CM

## 2016-06-30 DIAGNOSIS — I10 Essential (primary) hypertension: Secondary | ICD-10-CM | POA: Diagnosis not present

## 2016-06-30 MED ORDER — MANNITOL 25 % IV SOLN
Freq: Once | INTRAVENOUS | Status: AC
  Administered 2016-06-30: 11:00:00 via INTRAVENOUS
  Filled 2016-06-30: qty 10

## 2016-06-30 MED ORDER — PALONOSETRON HCL INJECTION 0.25 MG/5ML
0.2500 mg | Freq: Once | INTRAVENOUS | Status: AC
  Administered 2016-06-30: 0.25 mg via INTRAVENOUS

## 2016-06-30 MED ORDER — SODIUM CHLORIDE 0.9 % IV SOLN
Freq: Once | INTRAVENOUS | Status: AC
  Administered 2016-06-30: 13:00:00 via INTRAVENOUS
  Filled 2016-06-30: qty 5

## 2016-06-30 MED ORDER — PALONOSETRON HCL INJECTION 0.25 MG/5ML
INTRAVENOUS | Status: AC
  Filled 2016-06-30: qty 5

## 2016-06-30 MED ORDER — SODIUM CHLORIDE 0.9 % IV SOLN
100.0000 mg/m2 | Freq: Once | INTRAVENOUS | Status: AC
  Administered 2016-06-30: 184 mg via INTRAVENOUS
  Filled 2016-06-30: qty 184

## 2016-06-30 MED ORDER — SODIUM CHLORIDE 0.9% FLUSH
10.0000 mL | INTRAVENOUS | Status: DC | PRN
  Administered 2016-06-30: 10 mL
  Filled 2016-06-30: qty 10

## 2016-06-30 MED ORDER — HEPARIN SOD (PORK) LOCK FLUSH 100 UNIT/ML IV SOLN
500.0000 [IU] | Freq: Once | INTRAVENOUS | Status: AC | PRN
  Administered 2016-06-30: 500 [IU]
  Filled 2016-06-30: qty 5

## 2016-06-30 NOTE — Patient Instructions (Addendum)
[facility] Discharge Instructions for Patients Receiving Chemotherapy  Today you received the following chemotherapy agent: Cisplatin.  To help prevent nausea and vomiting after your treatment, we encourage you to take your nausea medication as prescribed.   If you develop nausea and vomiting that is not controlled by your nausea medication, call the clinic.   BELOW ARE SYMPTOMS THAT SHOULD BE REPORTED IMMEDIATELY:  *FEVER GREATER THAN 100.5 F  *CHILLS WITH OR WITHOUT FEVER  NAUSEA AND VOMITING THAT IS NOT CONTROLLED WITH YOUR NAUSEA MEDICATION  *UNUSUAL SHORTNESS OF BREATH  *UNUSUAL BRUISING OR BLEEDING  TENDERNESS IN MOUTH AND THROAT WITH OR WITHOUT PRESENCE OF ULCERS  *URINARY PROBLEMS  *BOWEL PROBLEMS  UNUSUAL RASH Items with * indicate a potential emergency and should be followed up as soon as possible.  Feel free to call the clinic you have any questions or concerns. The clinic phone number is [phone].  Please show the [facility] at check-in to the Emergency Department and triage nurse.  Cisplatin injection What is this medicine? CISPLATIN (SIS pla tin) is a chemotherapy drug. It targets fast dividing cells, like cancer cells, and causes these cells to die. This medicine is used to treat many types of cancer like bladder, ovarian, and testicular cancers. This medicine may be used for other purposes; ask your health care provider or pharmacist if you have questions. COMMON BRAND NAME(S): Platinol, Platinol -AQ What should I tell my health care provider before I take this medicine? They need to know if you have any of these conditions: -blood disorders -hearing problems -kidney disease -recent or ongoing radiation therapy -an unusual or allergic reaction to cisplatin, carboplatin, other chemotherapy, other medicines, foods, dyes, or preservatives -pregnant or trying to get pregnant -breast-feeding How should I use this medicine? This  drug is given as an infusion into a vein. It is administered in a hospital or clinic by a specially trained health care professional. Talk to your pediatrician regarding the use of this medicine in children. Special care may be needed. Overdosage: If you think you have taken too much of this medicine contact a poison control center or emergency room at once. NOTE: This medicine is only for you. Do not share this medicine with others. What if I miss a dose? It is important not to miss a dose. Call your doctor or health care professional if you are unable to keep an appointment. What may interact with this medicine? -dofetilide -foscarnet -medicines for seizures -medicines to increase blood counts like filgrastim, pegfilgrastim, sargramostim -probenecid -pyridoxine used with altretamine -rituximab -some antibiotics like amikacin, gentamicin, neomycin, polymyxin B, streptomycin, tobramycin -sulfinpyrazone -vaccines -zalcitabine Talk to your doctor or health care professional before taking any of these medicines: -acetaminophen -aspirin -ibuprofen -ketoprofen -naproxen This list may not describe all possible interactions. Give your health care provider a list of all the medicines, herbs, non-prescription drugs, or dietary supplements you use. Also tell them if you smoke, drink alcohol, or use illegal drugs. Some items may interact with your medicine. What should I watch for while using this medicine? Your condition will be monitored carefully while you are receiving this medicine. You will need important blood work done while you are taking this medicine. This drug may make you feel generally unwell. This is not uncommon, as chemotherapy can affect healthy cells as well as cancer cells. Report any side effects. Continue your course of treatment even though you feel ill unless your doctor tells you to stop. In  some cases, you may be given additional medicines to help with side effects. Follow  all directions for their use. Call your doctor or health care professional for advice if you get a fever, chills or sore throat, or other symptoms of a cold or flu. Do not treat yourself. This drug decreases your body's ability to fight infections. Try to avoid being around people who are sick. This medicine may increase your risk to bruise or bleed. Call your doctor or health care professional if you notice any unusual bleeding. Be careful brushing and flossing your teeth or using a toothpick because you may get an infection or bleed more easily. If you have any dental work done, tell your dentist you are receiving this medicine. Avoid taking products that contain aspirin, acetaminophen, ibuprofen, naproxen, or ketoprofen unless instructed by your doctor. These medicines may hide a fever. Do not become pregnant while taking this medicine. Women should inform their doctor if they wish to become pregnant or think they might be pregnant. There is a potential for serious side effects to an unborn child. Talk to your health care professional or pharmacist for more information. Do not breast-feed an infant while taking this medicine. Drink fluids as directed while you are taking this medicine. This will help protect your kidneys. Call your doctor or health care professional if you get diarrhea. Do not treat yourself. What side effects may I notice from receiving this medicine? Side effects that you should report to your doctor or health care professional as soon as possible: -allergic reactions like skin rash, itching or hives, swelling of the face, lips, or tongue -signs of infection - fever or chills, cough, sore throat, pain or difficulty passing urine -signs of decreased platelets or bleeding - bruising, pinpoint red spots on the skin, black, tarry stools, nosebleeds -signs of decreased red blood cells - unusually weak or tired, fainting spells, lightheadedness -breathing problems -changes in  hearing -gout pain -low blood counts - This drug may decrease the number of white blood cells, red blood cells and platelets. You may be at increased risk for infections and bleeding. -nausea and vomiting -pain, swelling, redness or irritation at the injection site -pain, tingling, numbness in the hands or feet -problems with balance, movement -trouble passing urine or change in the amount of urine Side effects that usually do not require medical attention (report to your doctor or health care professional if they continue or are bothersome): -changes in vision -loss of appetite -metallic taste in the mouth or changes in taste This list may not describe all possible side effects. Call your doctor for medical advice about side effects. You may report side effects to FDA at 1-800-FDA-1088. Where should I keep my medicine? This drug is given in a hospital or clinic and will not be stored at home. NOTE: This sheet is a summary. It may not cover all possible information. If you have questions about this medicine, talk to your doctor, pharmacist, or health care provider.  2018 Elsevier/Gold Standard (2007-05-07 14:40:54)

## 2016-06-30 NOTE — Progress Notes (Signed)
Oncology Nurse Navigator Documentation  Met with Antonio [name] following New Start RT to provide PEG care and reinforce previous PEG education. He indicated he had not changed surgical dressing s/p 5/11 placement, has not been conducting daily flushes. Changed dressing:  Upon removal, dressing with minimal discharge, trace dried blood.  Minimal amount of odorless whitish discharge and scant clotted blood under retention ring.   I cleaned insertion site with saline, no erythema at insertion.  Placed split gauze under retention ring. On inspection of tube:  Evidence of beige-colored substance above and below clamp which was not completely engaged.  Antonio [name] correctly conducted gravity 60 cc water flush, tube content cleared.  I reinforced proper clamping technique. At completion of session, Antonio [name] expressed confidence he can conduct future daily dressing changes and water flushes. Of further note, he stated he tolerated first RT without difficulty, "it wasn't that bad". He understands to contact me with questions/needs.  [name], RN, BSN, [place] Oncology Nurse [facility] [place] [phone]

## 2016-06-30 NOTE — Progress Notes (Signed)
Oncology Nurse Navigator Documentation  Met Mr. Aristide Waggle Short Stay, delivered/explained PEG care kit (split gauze, syringes, tape, mesh briefs, saline) and case of supplement. He voiced understanding of supplies, knows I will provide ongoing PEG support.  [name], RN, BSN, [place] [facility] Nurse [facility] [facility] [place] [phone]

## 2016-07-01 ENCOUNTER — Ambulatory Visit (HOSPITAL_BASED_OUTPATIENT_CLINIC_OR_DEPARTMENT_OTHER): Payer: Medicare Other

## 2016-07-01 DIAGNOSIS — C321 Malignant neoplasm of supraglottis: Secondary | ICD-10-CM | POA: Diagnosis not present

## 2016-07-01 MED ORDER — SODIUM CHLORIDE 0.9 % IV SOLN
Freq: Once | INTRAVENOUS | Status: AC
  Administered 2016-07-01: 10:00:00 via INTRAVENOUS

## 2016-07-01 MED ORDER — SODIUM CHLORIDE 0.9 % IJ SOLN
10.0000 mL | Freq: Once | INTRAMUSCULAR | Status: AC
  Administered 2016-07-01: 10 mL
  Filled 2016-07-01: qty 10

## 2016-07-01 MED ORDER — HEPARIN SOD (PORK) LOCK FLUSH 100 UNIT/ML IV SOLN
500.0000 [IU] | Freq: Once | INTRAVENOUS | Status: AC
  Administered 2016-07-01: 500 [IU]
  Filled 2016-07-01: qty 5

## 2016-07-01 NOTE — Patient Instructions (Signed)
Dehydration, Adult Dehydration is when there is not enough fluid or water in your body. This happens when you lose more fluids than you take in. Dehydration can range from mild to very bad. It should be treated right away to keep it from getting very bad. Symptoms of mild dehydration may include:   Thirst.  Dry lips.  Slightly dry mouth.  Dry, warm skin.  Dizziness. Symptoms of moderate dehydration may include:   Very dry mouth.  Muscle cramps.  Dark pee (urine). Pee may be the color of tea.  Your body making less pee.  Your eyes making fewer tears.  Heartbeat that is uneven or faster than normal (palpitations).  Headache.  Light-headedness, especially when you stand up from sitting.  Fainting (syncope). Symptoms of very bad dehydration may include:   Changes in skin, such as:  Cold and clammy skin.  Blotchy (mottled) or pale skin.  Skin that does not quickly return to normal after being lightly pinched and let go (poor skin turgor).  Changes in body fluids, such as:  Feeling very thirsty.  Your eyes making fewer tears.  Not sweating when body temperature is high, such as in hot weather.  Your body making very little pee.  Changes in vital signs, such as:  Weak pulse.  Pulse that is more than 100 beats a minute when you are sitting still.  Fast breathing.  Low blood pressure.  Other changes, such as:  Sunken eyes.  Cold hands and feet.  Confusion.  Lack of energy (lethargy).  Trouble waking up from sleep.  Short-term weight loss.  Unconsciousness. Follow these instructions at home:  If told by your doctor, drink an ORS:  Make an ORS by using instructions on the package.  Start by drinking small amounts, about  cup (120 mL) every 5-10 minutes.  Slowly drink more until you have had the amount that your doctor said to have.  Drink enough clear fluid to keep your pee clear or pale yellow. If you were told to drink an ORS, finish the  ORS first, then start slowly drinking clear fluids. Drink fluids such as:  Water. Do not drink only water by itself. Doing that can make the salt (sodium) level in your body get too low (hyponatremia).  Ice chips.  Fruit juice that you have added water to (diluted).  Low-calorie sports drinks.  Avoid:  Alcohol.  Drinks that have a lot of sugar. These include high-calorie sports drinks, fruit juice that does not have water added, and soda.  Caffeine.  Foods that are greasy or have a lot of fat or sugar.  Take over-the-counter and prescription medicines only as told by your doctor.  Do not take salt tablets. Doing that can make the salt level in your body get too high (hypernatremia).  Eat foods that have minerals (electrolytes). Examples include bananas, oranges, potatoes, tomatoes, and spinach.  Keep all follow-up visits as told by your doctor. This is important. Contact a doctor if:  You have belly (abdominal) pain that:  Gets worse.  Stays in one area (localizes).  You have a rash.  You have a stiff neck.  You get angry or annoyed more easily than normal (irritability).  You are more sleepy than normal.  You have a harder time waking up than normal.  You feel:  Weak.  Dizzy.  Very thirsty.  You have peed (urinated) only a small amount of very dark pee during 6-8 hours. Get help right away if:  You   have symptoms of very bad dehydration.  You cannot drink fluids without throwing up (vomiting).  Your symptoms get worse with treatment.  You have a fever.  You have a very bad headache.  You are throwing up or having watery poop (diarrhea) and it:  Gets worse.  Does not go away.  You have blood or something green (bile) in your throw-up.  You have blood in your poop (stool). This may cause poop to look black and tarry.  You have not peed in 6-8 hours.  You pass out (faint).  Your heart rate when you are sitting still is more than 100 beats a  minute.  You have trouble breathing. This information is not intended to replace advice given to you by your health care provider. Make sure you discuss any questions you have with your health care provider. Document Released: 11/26/2008 Document Revised: 08/20/2015 Document Reviewed: 03/26/2015 Elsevier Interactive Patient Education  2017 Elsevier Inc.  

## 2016-07-03 ENCOUNTER — Ambulatory Visit
Admission: RE | Admit: 2016-07-03 | Discharge: 2016-07-03 | Disposition: A | Discharge status: Home / Self Care | Payer: Medicare Other | Source: Ambulatory Visit | Attending: Radiation Oncology | Admitting: Radiation Oncology

## 2016-07-03 VITALS — BP 145/90 | HR 88 | Temp 98.6°F | Resp 20 | Wt 157.2 lb

## 2016-07-03 DIAGNOSIS — C321 Malignant neoplasm of supraglottis: Secondary | ICD-10-CM | POA: Diagnosis not present

## 2016-07-03 DIAGNOSIS — Z510 Encounter for antineoplastic radiation therapy: Secondary | ICD-10-CM | POA: Diagnosis not present

## 2016-07-03 DIAGNOSIS — K5900 Constipation, unspecified: Secondary | ICD-10-CM | POA: Diagnosis not present

## 2016-07-03 DIAGNOSIS — C3431 Malignant neoplasm of lower lobe, right bronchus or lung: Secondary | ICD-10-CM | POA: Diagnosis not present

## 2016-07-03 DIAGNOSIS — I10 Essential (primary) hypertension: Secondary | ICD-10-CM | POA: Diagnosis not present

## 2016-07-03 DIAGNOSIS — E7800 Pure hypercholesterolemia, unspecified: Secondary | ICD-10-CM | POA: Diagnosis not present

## 2016-07-03 DIAGNOSIS — K219 Gastro-esophageal reflux disease without esophagitis: Secondary | ICD-10-CM | POA: Diagnosis not present

## 2016-07-03 MED ORDER — LIDOCAINE VISCOUS 2 % MT SOLN: OROMUCOSAL | 5 refills | Status: DC

## 2016-07-03 NOTE — Progress Notes (Signed)
Antonio [name] completed 5th fraction of radiation to larynx.  Patient complains of pain 9 out of 10 to left side of face.  Patient reports taking morphine sulfate for pain control.  Unable to tell what other medications he is taking.  Patient is getting IV hydration in infusion room.  Patient states he has an appetite but he has no taste and some times has difficulty swallowing.  Patient is not using peg tube.  Patient denies having a cough.  Patient has a fullness with swallowing.  Patient sips water constantly equaling about 8 glasses and eats broth and mashed potatoes, was able to take broccoli yesterday.  Not taking any protein supplements.  Vitals:   07/03/16 1550  BP: (!) 145/90  Pulse: 88  Resp: 20  Temp: 98.6 F (37 C)  TempSrc: Oral  SpO2: 98%  Weight: 157 lb 3.2 oz (71.3 kg)    Wt Readings from Last 3 Encounters:  07/03/16 157 lb 3.2 oz (71.3 kg)  06/28/16 161 lb 9.6 oz (73.3 kg)  06/23/16 162 lb 3.2 oz (73.6 kg)

## 2016-07-03 NOTE — Progress Notes (Addendum)
   Weekly Management Note:  Outpatient    ICD-9-CM ICD-10-CM   1. Malignant neoplasm of lower lobe of right lung (HCC) 162.5 C34.31 lidocaine (XYLOCAINE) 2 % solution  2. 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  CBCT/MVCT images/Port film x-rays were reviewed.  The chart was checked. Pain in face helped by morphine, nausea after last chemo infusion.  Physical Findings:  Wt Readings from Last 3 Encounters:  07/03/16 157 lb 3.2 oz (71.3 kg)  06/28/16 161 lb 9.6 oz (73.3 kg)  06/23/16 162 lb 3.2 oz (73.6 kg)    weight is 157 lb 3.2 oz (71.3 kg). His oral temperature is 98.6 F (37 C). His blood pressure is 145/90 (abnormal) and his pulse is 88. His respiration is 20 and oxygen saturation is 98%.  NAD, no mucositis in oral cavity/throat.  CBC    Component Value Date/Time   WBC 7.5 06/28/2016 1204   WBC 6.7 06/23/2016 0954   RBC 5.11 06/28/2016 1204   RBC 4.93 06/23/2016 0954   HGB 14.2 06/28/2016 1204   HCT 42.5 06/28/2016 1204   PLT 391 06/28/2016 1204   MCV 83.2 06/28/2016 1204   MCH 27.7 06/28/2016 1204   MCH 28.4 06/23/2016 0954   MCHC 33.3 06/28/2016 1204   MCHC 34.0 06/23/2016 0954   RDW 14.4 06/28/2016 1204   LYMPHSABS 1.1 06/28/2016 1204   MONOABS 0.9 06/28/2016 1204   EOSABS 0.2 06/28/2016 1204   BASOSABS 0.1 06/28/2016 1204     CMP     Component Value Date/Time   NA 138 06/28/2016 1204   K 3.6 06/28/2016 1204   CL 97 (L) 06/23/2016 0954   CO2 32 (H) 06/28/2016 1204   GLUCOSE 117 06/28/2016 1204   BUN 6.3 (L) 06/28/2016 1204   CREATININE 1.0 06/28/2016 1204   CALCIUM 10.4 06/28/2016 1204   PROT 7.7 06/28/2016 1204   ALBUMIN 3.8 06/28/2016 1204   AST 23 06/28/2016 1204   ALT 24 06/28/2016 1204   ALKPHOS 77 06/28/2016 1204   BILITOT 0.67 06/28/2016 1204   GFRNONAA >60 06/23/2016 0954   GFRAA  >60 06/23/2016 0954     Impression:  The patient is tolerating radiotherapy.   Plan:  Continue radiotherapy as planned. Lidocaine for future mucositis Rx today  -----------------------------------  [name], MD

## 2016-07-04 ENCOUNTER — Telehealth: Payer: Self-pay

## 2016-07-04 ENCOUNTER — Ambulatory Visit: Payer: Medicare Other | Admitting: Nutrition

## 2016-07-04 ENCOUNTER — Ambulatory Visit
Admission: RE | Admit: 2016-07-04 | Discharge: 2016-07-04 | Disposition: A | Discharge status: Home / Self Care | Payer: Medicare Other | Source: Ambulatory Visit | Attending: Radiation Oncology | Admitting: Radiation Oncology

## 2016-07-04 DIAGNOSIS — C321 Malignant neoplasm of supraglottis: Secondary | ICD-10-CM | POA: Diagnosis not present

## 2016-07-04 DIAGNOSIS — K5900 Constipation, unspecified: Secondary | ICD-10-CM | POA: Diagnosis not present

## 2016-07-04 DIAGNOSIS — I10 Essential (primary) hypertension: Secondary | ICD-10-CM | POA: Diagnosis not present

## 2016-07-04 DIAGNOSIS — K219 Gastro-esophageal reflux disease without esophagitis: Secondary | ICD-10-CM | POA: Diagnosis not present

## 2016-07-04 DIAGNOSIS — Z510 Encounter for antineoplastic radiation therapy: Secondary | ICD-10-CM | POA: Diagnosis not present

## 2016-07-04 DIAGNOSIS — C3431 Malignant neoplasm of lower lobe, right bronchus or lung: Secondary | ICD-10-CM | POA: Diagnosis not present

## 2016-07-04 DIAGNOSIS — E7800 Pure hypercholesterolemia, unspecified: Secondary | ICD-10-CM | POA: Diagnosis not present

## 2016-07-04 DIAGNOSIS — R634 Abnormal weight loss: Secondary | ICD-10-CM

## 2016-07-04 MED ORDER — OSMOLITE 1.5 CAL PO LIQD: ORAL | 0 refills | Status: DC

## 2016-07-04 NOTE — Telephone Encounter (Signed)
Attempted to call, mail box full.

## 2016-07-04 NOTE — Telephone Encounter (Signed)
-----   Message from Ma Hillock, RN sent at 06/30/2016  3:01 PM EDT ----- Regarding: 1st Cisplatin F/U: Antonio [name] Patient received 1st dose of cisplatin and tolerated the infusion without difficulty. Please call patient to follow up.

## 2016-07-04 NOTE — Progress Notes (Signed)
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 Patient receives concurrent chemoradiation therapy with high-dose cisplatin. Patient reports he had a rough week after chemotherapy. Weight was documented as 157 pounds down from 162.2 pounds on May 8 Patient weighs 20 pounds (11%) less than he did 5 months ago. He reports altered taste and difficulty swallowing. He is trying to drink water and eat soft foods. Patient has no difficulty flushing his PEG with water.  Estimated nutrition needs: 2040-2240 calories, 88-105 grams protein, 2.2 L fluid.  Nutrition diagnosis:  Unintended weight loss continues.  Intervention: Educated patient to continue soft diet with Ensure Plus by mouth 3 times a day. In addition patient will begin using Osmolite 1.5 via PEG, 3 times a day between meals, with 60 mL free water before and after bolus feeding. Patient will advance tube feeding to 1-1/2 bottles Osmolite 1.5 4 times a day once he has no longer able to swallow.  In addition he will either drink or flush feeding tube with 240 cc water 3 times a day. Written education was provided.  Teach back method used.  Questions were answered. Tube feeding orders were written and advanced homecare was notified.  Monitoring, evaluation, goals: Patient will tolerate tube feedings plus oral intake to minimize further weight loss.  Next visit: Thursday, May 31.  After radiation therapy.  **Disclaimer: This note was dictated with voice recognition software. Similar sounding words can inadvertently be transcribed and this note may contain transcription errors which may not have been corrected upon publication of note.**

## 2016-07-05 ENCOUNTER — Other Ambulatory Visit: Payer: Self-pay | Admitting: Hematology and Oncology

## 2016-07-05 ENCOUNTER — Telehealth: Payer: Self-pay

## 2016-07-05 ENCOUNTER — Telehealth: Payer: Self-pay | Admitting: *Deleted

## 2016-07-05 ENCOUNTER — Telehealth: Payer: Self-pay | Admitting: Hematology and Oncology

## 2016-07-05 ENCOUNTER — Ambulatory Visit
Admission: RE | Admit: 2016-07-05 | Discharge: 2016-07-05 | Disposition: A | Discharge status: Home / Self Care | Payer: Medicare Other | Source: Ambulatory Visit | Attending: Radiation Oncology | Admitting: Radiation Oncology

## 2016-07-05 ENCOUNTER — Ambulatory Visit (HOSPITAL_BASED_OUTPATIENT_CLINIC_OR_DEPARTMENT_OTHER): Payer: Medicare Other | Admitting: Hematology and Oncology

## 2016-07-05 DIAGNOSIS — C321 Malignant neoplasm of supraglottis: Secondary | ICD-10-CM

## 2016-07-05 DIAGNOSIS — F17200 Nicotine dependence, unspecified, uncomplicated: Secondary | ICD-10-CM

## 2016-07-05 DIAGNOSIS — C770 Secondary and unspecified malignant neoplasm of lymph nodes of head, face and neck: Secondary | ICD-10-CM

## 2016-07-05 DIAGNOSIS — K219 Gastro-esophageal reflux disease without esophagitis: Secondary | ICD-10-CM | POA: Diagnosis not present

## 2016-07-05 DIAGNOSIS — R634 Abnormal weight loss: Secondary | ICD-10-CM

## 2016-07-05 DIAGNOSIS — Z510 Encounter for antineoplastic radiation therapy: Secondary | ICD-10-CM | POA: Diagnosis not present

## 2016-07-05 DIAGNOSIS — E7800 Pure hypercholesterolemia, unspecified: Secondary | ICD-10-CM | POA: Diagnosis not present

## 2016-07-05 DIAGNOSIS — G893 Neoplasm related pain (acute) (chronic): Secondary | ICD-10-CM

## 2016-07-05 DIAGNOSIS — I69391 Dysphagia following cerebral infarction: Secondary | ICD-10-CM | POA: Diagnosis not present

## 2016-07-05 DIAGNOSIS — C760 Malignant neoplasm of head, face and neck: Secondary | ICD-10-CM | POA: Diagnosis not present

## 2016-07-05 DIAGNOSIS — C329 Malignant neoplasm of larynx, unspecified: Secondary | ICD-10-CM | POA: Diagnosis not present

## 2016-07-05 DIAGNOSIS — I10 Essential (primary) hypertension: Secondary | ICD-10-CM | POA: Diagnosis not present

## 2016-07-05 DIAGNOSIS — K5900 Constipation, unspecified: Secondary | ICD-10-CM | POA: Diagnosis not present

## 2016-07-05 DIAGNOSIS — C3431 Malignant neoplasm of lower lobe, right bronchus or lung: Secondary | ICD-10-CM | POA: Diagnosis not present

## 2016-07-05 MED ORDER — OSMOLITE 1.5 CAL PO LIQD: ORAL | 11 refills | Status: DC

## 2016-07-05 NOTE — Telephone Encounter (Signed)
Call from Sunday Corn with [facility] requesting a hard copy MD order for enteral nutrition. S/w Dory Peru to clarify what she had recommended. Order is on MAR.  And then lvm with Dory Peru to call [facility] enteral pharmacy. A hard copy can be faxed to [phone] attn Jason Utt.

## 2016-07-05 NOTE — Telephone Encounter (Signed)
I have signed a copy of prescription and give it to [facility]

## 2016-07-05 NOTE — Telephone Encounter (Signed)
Faxed enteral nutrition prescription to [facility]

## 2016-07-05 NOTE — Telephone Encounter (Signed)
Scheduled appt per 5/16 LOS - patient coming in today - okay per [name] .

## 2016-07-05 NOTE — Telephone Encounter (Signed)
Patient was given a copy of the AVS report and appointment schedule per 07/05/16 los.

## 2016-07-06 ENCOUNTER — Ambulatory Visit
Admission: RE | Admit: 2016-07-06 | Discharge: 2016-07-06 | Disposition: A | Discharge status: Home / Self Care | Payer: Medicare Other | Source: Ambulatory Visit | Attending: Radiation Oncology | Admitting: Radiation Oncology

## 2016-07-06 ENCOUNTER — Encounter: Payer: Self-pay | Admitting: Hematology and Oncology

## 2016-07-06 DIAGNOSIS — C770 Secondary and unspecified malignant neoplasm of lymph nodes of head, face and neck: Secondary | ICD-10-CM | POA: Insufficient documentation

## 2016-07-06 DIAGNOSIS — K219 Gastro-esophageal reflux disease without esophagitis: Secondary | ICD-10-CM | POA: Diagnosis not present

## 2016-07-06 DIAGNOSIS — C321 Malignant neoplasm of supraglottis: Secondary | ICD-10-CM | POA: Diagnosis not present

## 2016-07-06 DIAGNOSIS — I10 Essential (primary) hypertension: Secondary | ICD-10-CM | POA: Diagnosis not present

## 2016-07-06 DIAGNOSIS — K5900 Constipation, unspecified: Secondary | ICD-10-CM | POA: Diagnosis not present

## 2016-07-06 DIAGNOSIS — Z510 Encounter for antineoplastic radiation therapy: Secondary | ICD-10-CM | POA: Diagnosis not present

## 2016-07-06 DIAGNOSIS — C3431 Malignant neoplasm of lower lobe, right bronchus or lung: Secondary | ICD-10-CM | POA: Diagnosis not present

## 2016-07-06 DIAGNOSIS — E7800 Pure hypercholesterolemia, unspecified: Secondary | ICD-10-CM | POA: Diagnosis not present

## 2016-07-06 NOTE — Assessment & Plan Note (Signed)
He tolerated treatment well His pain is less and the neck swelling has improved We will continue treatment without dose adjustment and I will continue to see him on a weekly basis for supportive care

## 2016-07-06 NOTE — Assessment & Plan Note (Signed)
This is much improved since chemotherapy He will continue to take morphine sulfate as needed

## 2016-07-06 NOTE — Assessment & Plan Note (Signed)
He continues to smoke periodically I reminded him the importance of tobacco cessation and he will continue to try to quit smoking

## 2016-07-06 NOTE — Progress Notes (Signed)
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 LEFT neck lymphadenopathy including LEFT level IIa to level 3 4.7 x 4.6 x 7.6 cm necrotic nodal conglomeration with suspected extracapsular spread of disease. Moderately narrowed hypopharynx.       05/18/2016 Procedure    Technically successful ultrasound-guided core biopsy of left cervical adenopathy.       05/18/2016 Pathology Results    Lymph node, needle/core biopsy, L cerv LAN - SQUAMOUS CELL CARCINOMA, SEE COMMENT. Microscopic Comment Residual lymph node is not seen. There is tumor invading skeletal muscle. p16 weakly positive      06/06/2016 Procedure    He had dental extraction      06/08/2016 PET scan    1. 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 2. Hypermetabolic right lower lobe pulmonary nodule is favored to represent a synchronous primary bronchogenic neoplasm. Isolated pulmonary metastasis could look similar. No thoracic nodal hypermetabolism. 3. Soft tissue hypermetabolism about the right iliac bone and greater trochanter of the proximal right femur are favored to be degenerative or posttraumatic. Soft tissue metastasis felt highly unlikely. 4.  Coronary artery atherosclerosis.  Aortic atherosclerosis. 5. A tiny right lower lobe pulmonary nodule is most likely a subpleural lymph node.      06/15/2016 Procedure    He has near normal baseline hearing test      06/23/2016 Procedure    Ultrasound and fluoroscopically guided right internal jugular single lumen power port catheter insertion. Tip in the SVC/RA junction. Catheter ready for use      06/23/2016 Procedure    Fluoroscopic insertion of a 20-French "pull-through" gastrostomy.      06/28/2016 -  Radiation Therapy    He received radiation      06/30/2016 -  Chemotherapy    He received chemotherapy        INTERVAL HISTORY: Please see below for problem oriented charting. He is seen in the presence of family members He denies side effects of treatment He felt better The neck mass has reduced in size He has less pain in the neck region He denies dysphagia No recent nausea or vomiting Denies peripheral neuropathy or hearing loss from treatment He denies recent alcohol intake. He is attempting to quit smoking  REVIEW OF SYSTEMS:   Constitutional: Denies fevers, chills or abnormal weight loss Eyes: Denies blurriness of vision Ears, nose, mouth, throat, and face: Denies mucositis or sore throat Respiratory: Denies cough, dyspnea or wheezes Cardiovascular: Denies palpitation, chest discomfort or lower extremity swelling Gastrointestinal:  Denies nausea, heartburn or change in bowel habits Skin: Denies abnormal skin rashes Lymphatics: Denies new lymphadenopathy or easy bruising Neurological:Denies numbness, tingling or new weaknesses Behavioral/Psych: Mood is stable, no new changes  All other systems were reviewed with the patient and are negative.  I have reviewed the past medical history, past surgical history,  social history and family history with the patient and they are unchanged from previous note.  ALLERGIES:  has No Known Allergies.  MEDICATIONS:  Current Outpatient Prescriptions   Medication Sig Dispense Refill  . atorvastatin (LIPITOR) 40 MG tablet Take 1 tablet (40 mg total) by mouth daily. 30 tablet 2  . cyclobenzaprine (FLEXERIL) 10 MG tablet Take 1 tablet (10 mg total) by mouth at bedtime. 30 tablet 1  . diclofenac sodium (VOLTAREN) 1 % GEL Apply 2 g topically 4 (four) times daily. (Patient not taking: Reported on 05/17/2016) 1 Tube 2  . emollient (BIAFINE) cream Apply topically as needed.    . gabapentin (NEURONTIN) 300 MG capsule Take 1 capsule (300 mg total) by mouth 3 (three) times daily. 90 capsule 3  . lidocaine (XYLOCAINE) 2 % solution Patient: Mix 1part 2% viscous lidocaine, 1part H20. Swish & swallow 38mL of diluted mixture, 23min before meals and at bedtime, up to QID 100 mL 5  . lidocaine-prilocaine (EMLA) cream Apply to affected area once (Patient not taking: Reported on 06/28/2016) 30 g 3  . loratadine (CLARITIN) 10 MG tablet TAKE ONE TABLET BY MOUTH ONCE DAILY 90 tablet 3  . LORazepam (ATIVAN) 0.5 MG tablet Take one tablet 20-30 minutes before radiotherapy PRN anxiety/ claustrophobia. 35 tablet 0  . morphine (MSIR) 15 MG tablet Take 1 tablet (15 mg total) by mouth every 6 (six) hours as needed for severe pain. 60 tablet 0  . Nutritional Supplements (FEEDING SUPPLEMENT, OSMOLITE 1.5 CAL,) LIQD Begin Osmolite 1.5 via PEG TID with 60 mL free water. Advance as tolerated to goal of 1 1/2 bottles QID with 60 mL free water before and after. In addition, drink or flush PEG with 240 mL free water TID. 6 Bottle 11  . ondansetron (ZOFRAN) 8 MG tablet Take 1 tablet (8 mg total) by mouth 2 (two) times daily as needed. Start on the third day after chemotherapy. (Patient not taking: Reported on 06/28/2016) 30 tablet 1  . prochlorperazine (COMPAZINE) 10 MG tablet Take 1 tablet (10 mg total) by mouth every 6 (six) hours as needed (Nausea or vomiting). (Patient not taking: Reported on 06/28/2016) 30 tablet 1  . senna-docusate (SENOKOT-S) 8.6-50 MG tablet Take 1 tablet by mouth 2  (two) times daily. (Patient not taking: Reported on 06/28/2016) 60 tablet 3   No current facility-administered medications for this visit.     PHYSICAL EXAMINATION: ECOG PERFORMANCE STATUS: 1 - Symptomatic but completely ambulatory  Vitals:   07/05/16 1538  BP: (!) 144/84  Pulse: 98  Resp: 20  Temp: 99.6 F (37.6 C)   Filed Weights   07/05/16 1538  Weight: 154 lb 12.8 oz (70.2 kg)    GENERAL:alert, no distress and comfortable SKIN: skin color, texture, turgor are normal, no rashes or significant lesions EYES: normal, Conjunctiva are pink and non-injected, sclera clear OROPHARYNX:no exudate, no erythema and lips, buccal mucosa, and tongue normal  NECK: supple, thyroid normal size, non-tender, without nodularity LYMPH: The neck mass on the left side has gotten a little smaller. LUNGS: clear to auscultation and percussion with normal breathing effort HEART: regular rate & rhythm and no murmurs and no lower extremity edema ABDOMEN:abdomen soft, non-tender and normal bowel sounds Musculoskeletal:no cyanosis of digits and no clubbing  NEURO: alert & oriented x 3 with fluent speech, no focal motor/sensory deficits  LABORATORY DATA:  I have reviewed the data as listed    Component Value Date/Time   NA 138 06/28/2016 1204   K 3.6  06/28/2016 1204   CL 97 (L) 06/23/2016 0954   CO2 32 (H) 06/28/2016 1204   GLUCOSE 117 06/28/2016 1204   BUN 6.3 (L) 06/28/2016 1204   CREATININE 1.0 06/28/2016 1204   CALCIUM 10.4 06/28/2016 1204   PROT 7.7 06/28/2016 1204   ALBUMIN 3.8 06/28/2016 1204   AST 23 06/28/2016 1204   ALT 24 06/28/2016 1204   ALKPHOS 77 06/28/2016 1204   BILITOT 0.67 06/28/2016 1204   GFRNONAA >60 06/23/2016 0954   GFRAA >60 06/23/2016 0954    No results found for: SPEP, UPEP  Lab Results  Component Value Date   WBC 7.5 06/28/2016   NEUTROABS 5.3 06/28/2016   HGB 14.2 06/28/2016   HCT 42.5 06/28/2016   MCV 83.2 06/28/2016   PLT 391 06/28/2016       Chemistry      Component Value Date/Time   NA 138 06/28/2016 1204   K 3.6 06/28/2016 1204   CL 97 (L) 06/23/2016 0954   CO2 32 (H) 06/28/2016 1204   BUN 6.3 (L) 06/28/2016 1204   CREATININE 1.0 06/28/2016 1204      Component Value Date/Time   CALCIUM 10.4 06/28/2016 1204   ALKPHOS 77 06/28/2016 1204   AST 23 06/28/2016 1204   ALT 24 06/28/2016 1204   BILITOT 0.67 06/28/2016 1204       RADIOGRAPHIC STUDIES: I have personally reviewed the radiological images as listed and agreed with the findings in the report. Ir Gastrostomy Tube Mod Sed  Result Date: 06/23/2016 INDICATION: Laryngeal cancer EXAM: TWENTY FRENCH PULL-THROUGH GASTROSTOMY Date:  5/11/20185/12/2016 12:17 pm Radiologist:  M. Daryll Brod, MD Guidance:  Fluoroscopic MEDICATIONS: Ancef 2 g; Antibiotics were administered within 1 hour of the procedure. Glucagon 0.5 mg IV ANESTHESIA/SEDATION: Versed 1.0 mg IV; Fentanyl 25 mcg IV Moderate Sedation Time:  10 minutes The patient was continuously monitored during the procedure by the interventional radiology nurse under my direct supervision. CONTRAST:  10 cc Isovue-300 - administered into the gastric lumen. FLUOROSCOPY TIME:  Fluoroscopy Time: 2 minutes 48 seconds (56 mGy). COMPLICATIONS: None immediate. PROCEDURE: Informed consent was obtained from the patient following explanation of the procedure, risks, benefits and alternatives. The patient understands, agrees and consents for the procedure. All questions were addressed. A time out was performed. Maximal barrier sterile technique utilized including caps, mask, sterile gowns, sterile gloves, large sterile drape, hand hygiene, and betadine prep. The left upper quadrant was sterilely prepped and draped. An oral gastric catheter was inserted into the stomach under fluoroscopy. The existing nasogastric feeding tube was removed. Air was injected into the stomach for insufflation and visualization under fluoroscopy. The air distended stomach  was confirmed beneath the anterior abdominal wall in the frontal and lateral projections. Under sterile conditions and local anesthesia, a 36 gauge trocar needle was utilized to access the stomach percutaneously beneath the left subcostal margin. Needle position was confirmed within the stomach under biplane fluoroscopy. Contrast injection confirmed position also. A single T tack was deployed for gastropexy. Over an Amplatz guide wire, a 9-French sheath was inserted into the stomach. A snare device was utilized to capture the oral gastric catheter. The snare device was pulled retrograde from the stomach up the esophagus and out the oropharynx. The 20-French pull-through gastrostomy was connected to the snare device and pulled antegrade through the oropharynx down the esophagus into the stomach and then through the percutaneous tract external to the patient. The gastrostomy was assembled externally. Contrast injection confirms position in the stomach. Images  were obtained for documentation. The patient tolerated procedure well. No immediate complication. IMPRESSION: Fluoroscopic insertion of a 20-French "pull-through" gastrostomy. Electronically Signed   By: Jerilynn Mages.  Shick M.D.   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 EXAM: NUCLEAR MEDICINE PET SKULL BASE TO THIGH TECHNIQUE: 114 mCi F-18 FDG was injected intravenously. Full-ring PET imaging was performed from the skull base to thigh after the radiotracer. CT data was obtained and used for attenuation correction and anatomic localization. FASTING BLOOD GLUCOSE:  Value: 8.5 mg/dl COMPARISON:  Neck CT 05/17/2016 FINDINGS: NECK Hypermetabolism corresponding to the left supraglottic laryngeal mass. This measures a S.U.V. max of 20.6, including on image 33/series 4. Probable extension across the midline, with early focus of hypermetabolism  in the region of the right posterior larynx measuring a S.U.V. max of 7.1, including on approximately image 37/series 4. The dominant left level 2-3 nodal mass demonstrates hypermetabolism and central necrosis. This measures a S.U.V. max of 16.6 on image 27/series 4. There are small but hypermetabolic right-sided level 2 nodes, including at 7 mm and a S.U.V. max of 9.5 on image 34/series 4. Left supraclavicular hypermetabolic adenopathy, with an index node measuring 9 mm and a S.U.V. max of 9.0 on image 43/series 4. Other neck findings deferred to prior diagnostic CT. Mass-effect upon the airway persists. CHEST No other thoracic nodal hypermetabolism. A spiculated right lower lobe pulmonary nodule measures 1.9 x 1.8 cm and a S.U.V. max of 8.1 on image 43/series 8. 2 mm subpleural right lower lobe nodule on image 52/series 8. Mild cardiomegaly. Anterior pericardial thickening is not hypermetabolic. Coronary artery atherosclerosis. ABDOMEN/PELVIS No abdominopelvic nodal or parenchymal hypermetabolism identified. Normal adrenal glands. A subtle pericholecystic right liver lobe 13 mm lesion is suspected on image 105/series 4. Not hypermetabolic. Abdominal aortic atherosclerosis. Mild prostatomegaly. SKELETON Focus of hypermetabolism about the anterior right iliac bone is felt to correspond to degenerative osseous irregularity. This measures a S.U.V. max of 4.0 including on image 157/series 4. A focus of soft tissue hypermetabolism adjacent the greater trochanter of the right femur is without osseous or soft tissue correlate. Degenerate change of both glenohumeral joints. IMPRESSION: 1. 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 2. Hypermetabolic right lower lobe pulmonary nodule is favored to represent a synchronous primary bronchogenic neoplasm. Isolated pulmonary metastasis could look similar. No thoracic nodal hypermetabolism. 3. Soft tissue  hypermetabolism about the right iliac bone and greater trochanter of the proximal right femur are favored to be degenerative or posttraumatic. Soft tissue metastasis felt highly unlikely. 4.  Coronary artery atherosclerosis. Aortic atherosclerosis. 5. A tiny right lower lobe pulmonary nodule is most likely a subpleural lymph node. Electronically Signed   By: Abigail Miyamoto M.D.   On: 06/08/2016 16:51   Ir US Guide Vasc Access Right  Result Date: 06/23/2016 CLINICAL DATA:  Laryngeal cancer EXAM: RIGHT INTERNAL JUGULAR SINGLE LUMEN POWER PORT CATHETER INSERTION Date:  5/11/20185/12/2016 12:14 pm Radiologist:  M. Daryll Brod, MD Guidance:  Ultrasound and fluoroscopic MEDICATIONS: Ancef 2 g; The antibiotic was administered within an appropriate time interval prior to skin puncture. ANESTHESIA/SEDATION: Versed 2.0 mg IV; Fentanyl 50 mcg IV; Moderate Sedation Time:  25 minutes The patient was continuously monitored during the procedure by the interventional radiology nurse under my direct supervision. FLUOROSCOPY TIME:  36 seconds (6 mGy) COMPLICATIONS: None immediate. CONTRAST:  None. PROCEDURE: Informed consent was obtained from the patient following explanation of the procedure, risks, benefits and alternatives. The patient understands, agrees and consents for the procedure. All questions were addressed. A time out was performed. Maximal barrier sterile technique utilized including caps, mask, sterile gowns, sterile gloves, large sterile drape, hand hygiene, and 2% chlorhexidine scrub. Under sterile conditions and local anesthesia, right internal jugular micropuncture venous access was performed. Access was performed with ultrasound. Images were obtained for documentation. A guide wire was inserted followed by a transitional dilator. This allowed insertion of a guide wire and catheter into the IVC. Measurements were obtained from the SVC / RA junction back to the right IJ venotomy site. In the right infraclavicular  chest, a subcutaneous pocket was created over the second anterior rib. This was done under sterile conditions and local anesthesia. 1% lidocaine with epinephrine was utilized for this. A 2.5 cm incision was made in the skin. Blunt dissection was performed to create a subcutaneous pocket over the right pectoralis major muscle. The pocket was flushed with saline vigorously. There was adequate hemostasis. The port catheter was assembled and checked for leakage. The port catheter was secured in the pocket with two retention sutures. The tubing was tunneled subcutaneously to the right venotomy site and inserted into the SVC/RA junction through a valved peel-away sheath. Position was confirmed with fluoroscopy. Images were obtained for documentation. The patient tolerated the procedure well. No immediate complications. Incisions were closed in a two layer fashion with 4 - 0 Vicryl suture. Dermabond was applied to the skin. The port catheter was accessed, blood was aspirated followed by saline and heparin flushes. Needle was removed. A dry sterile dressing was applied. IMPRESSION: Ultrasound and fluoroscopically guided right internal jugular single lumen power port catheter insertion. Tip in the SVC/RA junction. Catheter ready for use. Electronically Signed   By: Jerilynn Mages.  Shick M.D.   On: 06/23/2016 12:25   Ir Fluoro Guide Port Insertion Right  Result Date: 06/23/2016 CLINICAL DATA:  Laryngeal cancer EXAM: RIGHT INTERNAL JUGULAR SINGLE LUMEN POWER PORT CATHETER INSERTION Date:  5/11/20185/12/2016 12:14 pm Radiologist:  M. Daryll Brod, MD Guidance:  Ultrasound and fluoroscopic MEDICATIONS: Ancef 2 g; The antibiotic was administered within an appropriate time interval prior to skin puncture. ANESTHESIA/SEDATION: Versed 2.0 mg IV; Fentanyl 50 mcg IV; Moderate Sedation Time:  25 minutes The patient was continuously monitored during the procedure by the interventional radiology nurse under my direct supervision. FLUOROSCOPY  TIME:  36 seconds (6 mGy) COMPLICATIONS: None immediate. CONTRAST:  None. PROCEDURE: Informed consent was obtained from the patient following explanation of the procedure, risks, benefits and alternatives. The patient understands, agrees and consents for the procedure. All questions were addressed. A time out was performed. Maximal barrier sterile technique utilized including caps, mask, sterile gowns, sterile gloves, large sterile drape, hand hygiene, and 2% chlorhexidine scrub. Under sterile conditions and local anesthesia, right internal jugular micropuncture venous access was performed. Access was performed with ultrasound. Images were obtained for documentation. A guide wire was inserted followed by a transitional dilator. This allowed insertion of a guide wire and catheter into the IVC. Measurements were obtained from the SVC / RA junction back to the right IJ venotomy site. In the right infraclavicular chest, a subcutaneous pocket was created over the second anterior rib. This was done under sterile conditions and local anesthesia. 1% lidocaine with epinephrine was utilized for this. A 2.5 cm incision was made in the skin. Blunt dissection was performed to create a  subcutaneous pocket over the right pectoralis major muscle. The pocket was flushed with saline vigorously. There was adequate hemostasis. The port catheter was assembled and checked for leakage. The port catheter was secured in the pocket with two retention sutures. The tubing was tunneled subcutaneously to the right venotomy site and inserted into the SVC/RA junction through a valved peel-away sheath. Position was confirmed with fluoroscopy. Images were obtained for documentation. The patient tolerated the procedure well. No immediate complications. Incisions were closed in a two layer fashion with 4 - 0 Vicryl suture. Dermabond was applied to the skin. The port catheter was accessed, blood was aspirated followed by saline and heparin flushes.  Needle was removed. A dry sterile dressing was applied. IMPRESSION: Ultrasound and fluoroscopically guided right internal jugular single lumen power port catheter insertion. Tip in the SVC/RA junction. Catheter ready for use. Electronically Signed   By: Jerilynn Mages.  Shick M.D.   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  All questions were answered. The patient knows to call the clinic with any problems, questions or concerns. No barriers to learning was detected. I spent 15 minutes counseling the patient face to face. The total time spent in the appointment was 20 minutes and more than 50% was on counseling and review of test results     [name], MD 07/06/2016 4:46 PM

## 2016-07-06 NOTE — Assessment & Plan Note (Signed)
The neck mass is smaller in size He has positive response to treatment Continue to monitor closely

## 2016-07-07 ENCOUNTER — Ambulatory Visit
Admission: RE | Admit: 2016-07-07 | Discharge: 2016-07-07 | Disposition: A | Discharge status: Home / Self Care | Payer: Medicare Other | Source: Ambulatory Visit | Attending: Radiation Oncology | Admitting: Radiation Oncology

## 2016-07-07 DIAGNOSIS — Z510 Encounter for antineoplastic radiation therapy: Secondary | ICD-10-CM | POA: Diagnosis not present

## 2016-07-07 DIAGNOSIS — C3431 Malignant neoplasm of lower lobe, right bronchus or lung: Secondary | ICD-10-CM | POA: Diagnosis not present

## 2016-07-07 DIAGNOSIS — I10 Essential (primary) hypertension: Secondary | ICD-10-CM | POA: Diagnosis not present

## 2016-07-07 DIAGNOSIS — K219 Gastro-esophageal reflux disease without esophagitis: Secondary | ICD-10-CM | POA: Diagnosis not present

## 2016-07-07 DIAGNOSIS — C321 Malignant neoplasm of supraglottis: Secondary | ICD-10-CM | POA: Diagnosis not present

## 2016-07-07 DIAGNOSIS — E7800 Pure hypercholesterolemia, unspecified: Secondary | ICD-10-CM | POA: Diagnosis not present

## 2016-07-07 DIAGNOSIS — K5900 Constipation, unspecified: Secondary | ICD-10-CM | POA: Diagnosis not present

## 2016-07-11 ENCOUNTER — Ambulatory Visit
Admission: RE | Admit: 2016-07-11 | Discharge: 2016-07-11 | Disposition: A | Discharge status: Home / Self Care | Payer: Medicare Other | Source: Ambulatory Visit | Attending: Radiation Oncology | Admitting: Radiation Oncology

## 2016-07-11 ENCOUNTER — Encounter: Payer: Self-pay | Admitting: Radiation Oncology

## 2016-07-11 ENCOUNTER — Other Ambulatory Visit (HOSPITAL_BASED_OUTPATIENT_CLINIC_OR_DEPARTMENT_OTHER): Payer: Medicare Other

## 2016-07-11 VITALS — BP 127/95 | HR 88 | Temp 98.7°F | Ht 63.0 in | Wt 154.8 lb

## 2016-07-11 DIAGNOSIS — I10 Essential (primary) hypertension: Secondary | ICD-10-CM | POA: Diagnosis not present

## 2016-07-11 DIAGNOSIS — K219 Gastro-esophageal reflux disease without esophagitis: Secondary | ICD-10-CM | POA: Diagnosis not present

## 2016-07-11 DIAGNOSIS — E7800 Pure hypercholesterolemia, unspecified: Secondary | ICD-10-CM | POA: Diagnosis not present

## 2016-07-11 DIAGNOSIS — C321 Malignant neoplasm of supraglottis: Secondary | ICD-10-CM

## 2016-07-11 DIAGNOSIS — K5900 Constipation, unspecified: Secondary | ICD-10-CM | POA: Diagnosis not present

## 2016-07-11 DIAGNOSIS — C3431 Malignant neoplasm of lower lobe, right bronchus or lung: Secondary | ICD-10-CM | POA: Diagnosis not present

## 2016-07-11 DIAGNOSIS — Z510 Encounter for antineoplastic radiation therapy: Secondary | ICD-10-CM | POA: Diagnosis not present

## 2016-07-11 LAB — COMPREHENSIVE METABOLIC PANEL
ALBUMIN: 3.4 g/dL — AB (ref 3.5–5.0)
ALK PHOS: 67 U/L (ref 40–150)
ALT: 17 U/L (ref 0–55)
ANION GAP: 9 meq/L (ref 3–11)
AST: 17 U/L (ref 5–34)
BILIRUBIN TOTAL: 0.4 mg/dL (ref 0.20–1.20)
BUN: 8.6 mg/dL (ref 7.0–26.0)
CALCIUM: 9.4 mg/dL (ref 8.4–10.4)
CHLORIDE: 102 meq/L (ref 98–109)
CO2: 32 mEq/L — ABNORMAL HIGH (ref 22–29)
CREATININE: 0.9 mg/dL (ref 0.7–1.3)
EGFR: >90 mL/min/{1.73_m2} (ref >90)
Glucose: 110 mg/dl (ref 70–140)
Potassium: 3.7 mEq/L (ref 3.5–5.1)
Sodium: 143 mEq/L (ref 136–145)
Total Protein: 6.7 g/dL (ref 6.4–8.3)

## 2016-07-11 LAB — CBC WITH DIFFERENTIAL/PLATELET
BASO%: 0.3 % (ref 0.0–2.0)
Basophils Absolute: 0 10*3/uL (ref 0.0–0.1)
EOS%: 0.7 % (ref 0.0–7.0)
Eosinophils Absolute: 0 10*3/uL (ref 0.0–0.5)
HCT: 38.8 % (ref 38.4–49.9)
HEMOGLOBIN: 12.9 g/dL — AB (ref 13.0–17.1)
LYMPH%: 6 % — AB (ref 14.0–49.0)
MCH: 27.5 pg (ref 27.2–33.4)
MCHC: 33.4 g/dL (ref 32.0–36.0)
MCV: 82.4 fL (ref 79.3–98.0)
MONO#: 0.6 10*3/uL (ref 0.1–0.9)
MONO%: 9.2 % (ref 0.0–14.0)
NEUT%: 83.8 % — ABNORMAL HIGH (ref 39.0–75.0)
NEUTROS ABS: 5.1 10*3/uL (ref 1.5–6.5)
Platelets: 167 10*3/uL (ref 140–400)
RBC: 4.7 10*6/uL (ref 4.20–5.82)
RDW: 14.6 % (ref 11.0–14.6)
WBC: 6.1 10*3/uL (ref 4.0–10.3)
lymph#: 0.4 10*3/uL — ABNORMAL LOW (ref 0.9–3.3)

## 2016-07-11 LAB — MAGNESIUM: Magnesium: 2.2 mg/dl (ref 1.5–2.5)

## 2016-07-11 NOTE — Progress Notes (Signed)
   Weekly Management Note:  Outpatient   ICD-9-CM ICD-10-CM   1. 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[facility]) 161.1 C32.1     Current Dose:  18 Gy to H+N region; lung treatment pending Projected Dose: 70 Gy   Narrative:  The patient presents for routine under treatment assessment.  CBCT/MVCT images/Port film x-rays were reviewed.  The chart was checked.   The patient presents for his 9th fraction. He reports pain 9/10 to the left side of his face, for which he is taking morphine 15 mg. He is able to eat soft foods orally. The patient is also using his PEG tube and instilling 2-3 cans of Osmolite daily. He is able to drink water orally, though he also instills. The patient reports regular bowel movements. He is using Biafine twice a day as directed.  Physical Findings:  Wt Readings from Last 3 Encounters:  07/11/16 154 lb 12.8 oz (70.2 kg)  07/05/16 154 lb 12.8 oz (70.2 kg)  07/03/16 157 lb 3.2 oz (71.3 kg)    height is 5\' 3"  (1.6 m) and weight is 154 lb 12.8 oz (70.2 kg). His temperature is 98.7 F (37.1 C). His blood pressure is 127/95 (abnormal) and his pulse is 88. His oxygen saturation is 100%.   Some erythema in the throat but no obvious mucositis.  CBC    Component Value Date/Time   WBC 6.1 07/11/2016 1350   WBC 6.7 06/23/2016 0954   RBC 4.70 07/11/2016 1350   RBC 4.93 06/23/2016 0954   HGB 12.9 (L) 07/11/2016 1350   HCT 38.8 07/11/2016 1350   PLT 167 07/11/2016 1350   MCV 82.4 07/11/2016 1350   MCH 27.5 07/11/2016 1350   MCH 28.4 06/23/2016 0954   MCHC 33.4 07/11/2016 1350   MCHC 34.0 06/23/2016 0954   RDW 14.6 07/11/2016 1350   LYMPHSABS 0.4 (L) 07/11/2016 1350   MONOABS 0.6 07/11/2016 1350   EOSABS 0.0 07/11/2016 1350   BASOSABS 0.0 07/11/2016 1350   CMP     Component Value Date/Time   NA 143 07/11/2016 1350   K 3.7 07/11/2016 1350   CL 97 (L) 06/23/2016 0954   CO2 32 (H) 07/11/2016 1350   GLUCOSE 110 07/11/2016 1350   BUN 8.6 07/11/2016 1350   CREATININE 0.9 07/11/2016  1350   CALCIUM 9.4 07/11/2016 1350   PROT 6.7 07/11/2016 1350   ALBUMIN 3.4 (L) 07/11/2016 1350   AST 17 07/11/2016 1350   ALT 17 07/11/2016 1350   ALKPHOS 67 07/11/2016 1350   BILITOT 0.40 07/11/2016 1350   GFRNONAA >60 06/23/2016 0954   GFRAA >60 06/23/2016 0954     Impression:  The patient is tolerating radiotherapy.   Plan:  Continue radiotherapy as planned. Patient may continue to use Lidocaine and morphine as directed for pain.  -----------------------------------  [name], MD  This document serves as a record of services personally performed by [name], MD. It was created on her behalf by Maryla Morrow, a trained medical scribe. The creation of this record is based on the scribe's personal observations and the provider's statements to them. This document has been checked and approved by the attending provider.

## 2016-07-11 NOTE — Progress Notes (Signed)
[name] presents for his 9th fraction of radiation to his Larynx. He reports pain a 9/10 to the left side of his face. He is taking MSIR 15 mg for this pain. He is eating foods orally. He is also using his feeding tube and instilling 2-3 cans daily of osmolite. He is drinking water daily and also instilling it in his feeding tube. He is having regular bowel movements. His radiation site is intact and he is using biafine twice daily as directed.   BP (!) 127/95   Pulse 88   Temp 98.7 F (37.1 C)   Ht 5\' 3"  (1.6 m)   Wt 154 lb 12.8 oz (70.2 kg)   SpO2 100% Comment: room air  BMI 27.42 kg/m    Wt Readings from Last 3 Encounters:  07/11/16 154 lb 12.8 oz (70.2 kg)  07/05/16 154 lb 12.8 oz (70.2 kg)  07/03/16 157 lb 3.2 oz (71.3 kg)

## 2016-07-12 ENCOUNTER — Ambulatory Visit
Admission: RE | Admit: 2016-07-12 | Discharge: 2016-07-12 | Disposition: A | Discharge status: Home / Self Care | Payer: Medicare Other | Source: Ambulatory Visit | Attending: Radiation Oncology | Admitting: Radiation Oncology

## 2016-07-12 DIAGNOSIS — K219 Gastro-esophageal reflux disease without esophagitis: Secondary | ICD-10-CM | POA: Diagnosis not present

## 2016-07-12 DIAGNOSIS — C321 Malignant neoplasm of supraglottis: Secondary | ICD-10-CM | POA: Diagnosis not present

## 2016-07-12 DIAGNOSIS — C3431 Malignant neoplasm of lower lobe, right bronchus or lung: Secondary | ICD-10-CM | POA: Diagnosis not present

## 2016-07-12 DIAGNOSIS — K5900 Constipation, unspecified: Secondary | ICD-10-CM | POA: Diagnosis not present

## 2016-07-12 DIAGNOSIS — Z510 Encounter for antineoplastic radiation therapy: Secondary | ICD-10-CM | POA: Diagnosis not present

## 2016-07-12 DIAGNOSIS — E7800 Pure hypercholesterolemia, unspecified: Secondary | ICD-10-CM | POA: Diagnosis not present

## 2016-07-12 DIAGNOSIS — I10 Essential (primary) hypertension: Secondary | ICD-10-CM | POA: Diagnosis not present

## 2016-07-13 ENCOUNTER — Ambulatory Visit: Payer: Medicare Other | Admitting: Radiation Oncology

## 2016-07-13 ENCOUNTER — Other Ambulatory Visit: Payer: Self-pay | Admitting: Student in an Organized Health Care Education/Training Program

## 2016-07-13 ENCOUNTER — Encounter: Payer: Self-pay | Admitting: *Deleted

## 2016-07-13 ENCOUNTER — Ambulatory Visit
Admission: RE | Admit: 2016-07-13 | Discharge: 2016-07-13 | Disposition: A | Discharge status: Home / Self Care | Payer: Medicare Other | Source: Ambulatory Visit | Attending: Radiation Oncology | Admitting: Radiation Oncology

## 2016-07-13 ENCOUNTER — Ambulatory Visit (HOSPITAL_BASED_OUTPATIENT_CLINIC_OR_DEPARTMENT_OTHER): Payer: Medicare Other | Admitting: Hematology and Oncology

## 2016-07-13 ENCOUNTER — Telehealth: Payer: Self-pay | Admitting: *Deleted

## 2016-07-13 ENCOUNTER — Telehealth: Payer: Self-pay | Admitting: Hematology and Oncology

## 2016-07-13 ENCOUNTER — Ambulatory Visit: Payer: Medicare Other | Admitting: Nutrition

## 2016-07-13 DIAGNOSIS — C770 Secondary and unspecified malignant neoplasm of lymph nodes of head, face and neck: Secondary | ICD-10-CM

## 2016-07-13 DIAGNOSIS — K5900 Constipation, unspecified: Secondary | ICD-10-CM | POA: Diagnosis not present

## 2016-07-13 DIAGNOSIS — Z720 Tobacco use: Secondary | ICD-10-CM

## 2016-07-13 DIAGNOSIS — F17200 Nicotine dependence, unspecified, uncomplicated: Secondary | ICD-10-CM

## 2016-07-13 DIAGNOSIS — C321 Malignant neoplasm of supraglottis: Secondary | ICD-10-CM | POA: Diagnosis not present

## 2016-07-13 DIAGNOSIS — C3431 Malignant neoplasm of lower lobe, right bronchus or lung: Secondary | ICD-10-CM | POA: Diagnosis not present

## 2016-07-13 DIAGNOSIS — G893 Neoplasm related pain (acute) (chronic): Secondary | ICD-10-CM | POA: Diagnosis not present

## 2016-07-13 DIAGNOSIS — I10 Essential (primary) hypertension: Secondary | ICD-10-CM | POA: Diagnosis not present

## 2016-07-13 DIAGNOSIS — K219 Gastro-esophageal reflux disease without esophagitis: Secondary | ICD-10-CM | POA: Diagnosis not present

## 2016-07-13 DIAGNOSIS — E7800 Pure hypercholesterolemia, unspecified: Secondary | ICD-10-CM | POA: Diagnosis not present

## 2016-07-13 DIAGNOSIS — Z510 Encounter for antineoplastic radiation therapy: Secondary | ICD-10-CM | POA: Diagnosis not present

## 2016-07-13 MED ORDER — MORPHINE SULFATE 15 MG PO TABS: 15.0000 mg | ORAL_TABLET | Freq: Four times a day (QID) | ORAL | 0 refills | Status: DC | PRN

## 2016-07-13 NOTE — Telephone Encounter (Signed)
A user error has taken place: encounter opened in error, closed for administrative reasons.  [name], RN, BSN, [place] [facility] Nurse [place] at [place] [phone]

## 2016-07-13 NOTE — Telephone Encounter (Signed)
No los per 07/13/16 visit. Patient was given a copy of the AVS report and appointment schedule per 07/13/16 los.

## 2016-07-13 NOTE — Progress Notes (Signed)
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 Patient's weight decreased and documented as 154 pounds on May 29, down from 157 pounds. Patient has not tried Ensure Plus yet. He is tolerating between 2 and 3 bottles of Osmolite 1.5 via feeding tube. Patient has no difficulty using his feeding tube. He denies nausea, vomiting, constipation, diarrhea. Reports he does swallowing exercises twice daily.  Estimated nutrition needs: 2040-2240 calories, 88-105 grams protein, 2.2 L fluid.  Nutrition diagnosis: Unintended weight loss continues.  Intervention: Educated patient to increase Osmolite 1.5-1-1/2 bottles 3 times a day between meals with 60 mL free water before and after bolus feeding. In addition patient will try to consume meals with soft foods or Ensure Plus by mouth. Encouraged patient to drink or flush feeding tube with 240 mL free water 3 times a day. Teach back method used.  Questions were answered.  Monitoring, evaluation, goals: Patient will tolerate oral intake plus tube feeding to minimize weight loss throughout treatment.  Next visit: Thursday, June 7.  **Disclaimer: This note was dictated with voice recognition software. Similar sounding words can inadvertently be transcribed and this note may contain transcription errors which may not have been corrected upon publication of note.**

## 2016-07-13 NOTE — Progress Notes (Signed)
Oncology Nurse Navigator Documentation  Flowsheet update.  [name], RN, BSN, [place] [facility] Nurse [place] at [place] [phone]

## 2016-07-14 ENCOUNTER — Ambulatory Visit
Admission: RE | Admit: 2016-07-14 | Discharge: 2016-07-14 | Disposition: A | Discharge status: Home / Self Care | Payer: Medicare Other | Source: Ambulatory Visit | Attending: Radiation Oncology | Admitting: Radiation Oncology

## 2016-07-14 ENCOUNTER — Encounter: Payer: Self-pay | Admitting: Hematology and Oncology

## 2016-07-14 DIAGNOSIS — C3431 Malignant neoplasm of lower lobe, right bronchus or lung: Secondary | ICD-10-CM | POA: Diagnosis not present

## 2016-07-14 DIAGNOSIS — K219 Gastro-esophageal reflux disease without esophagitis: Secondary | ICD-10-CM | POA: Diagnosis not present

## 2016-07-14 DIAGNOSIS — E7800 Pure hypercholesterolemia, unspecified: Secondary | ICD-10-CM | POA: Diagnosis not present

## 2016-07-14 DIAGNOSIS — K5900 Constipation, unspecified: Secondary | ICD-10-CM | POA: Diagnosis not present

## 2016-07-14 DIAGNOSIS — Z510 Encounter for antineoplastic radiation therapy: Secondary | ICD-10-CM | POA: Diagnosis not present

## 2016-07-14 DIAGNOSIS — I10 Essential (primary) hypertension: Secondary | ICD-10-CM | POA: Diagnosis not present

## 2016-07-14 DIAGNOSIS — C321 Malignant neoplasm of supraglottis: Secondary | ICD-10-CM | POA: Diagnosis not present

## 2016-07-14 NOTE — Assessment & Plan Note (Signed)
He continues to smoke periodically I reminded him the importance of tobacco cessation and he will continue to try to quit smoking

## 2016-07-14 NOTE — Progress Notes (Signed)
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 LEFT neck lymphadenopathy including LEFT level IIa to level 3 4.7 x 4.6 x 7.6 cm necrotic nodal conglomeration with suspected extracapsular spread of disease. Moderately narrowed hypopharynx.       05/18/2016 Procedure    Technically successful ultrasound-guided core biopsy of left cervical adenopathy.       05/18/2016 Pathology Results    Lymph node, needle/core biopsy, L cerv LAN - SQUAMOUS CELL CARCINOMA, SEE COMMENT. Microscopic Comment Residual lymph node is not seen. There is tumor invading skeletal muscle. p16 weakly positive      06/06/2016 Procedure    He had dental extraction      06/08/2016 PET scan    1. 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 2. Hypermetabolic right lower lobe pulmonary nodule is favored to represent a synchronous primary bronchogenic neoplasm. Isolated pulmonary metastasis could look similar. No thoracic nodal hypermetabolism. 3. Soft tissue hypermetabolism about the right iliac bone and greater trochanter of the proximal right femur are favored to be degenerative or posttraumatic. Soft tissue metastasis felt highly unlikely. 4.  Coronary artery atherosclerosis.  Aortic atherosclerosis. 5. A tiny right lower lobe pulmonary nodule is most likely a subpleural lymph node.      06/15/2016 Procedure    He has near normal baseline hearing test      06/23/2016 Procedure    Ultrasound and fluoroscopically guided right internal jugular single lumen power port catheter insertion. Tip in the SVC/RA junction. Catheter ready for use      06/23/2016 Procedure    Fluoroscopic insertion of a 20-French "pull-through" gastrostomy.      06/28/2016 -  Radiation Therapy    He received radiation      06/30/2016 -  Chemotherapy    He received chemotherapy        INTERVAL HISTORY: Please see below for problem oriented charting. He returns for his weekly follow-up He is doing well He is still smoking but attempting to quit smoking He denies recent alcohol Denies mucositis, nausea vomiting His neck pain is about the same, improved Neck swelling has improved  REVIEW OF SYSTEMS:   Constitutional: Denies fevers, chills or abnormal weight loss Eyes: Denies blurriness of vision Ears, nose, mouth, throat, and face: Denies mucositis or sore throat Respiratory: Denies cough, dyspnea or wheezes Cardiovascular: Denies palpitation, chest discomfort or lower extremity swelling Gastrointestinal:  Denies nausea, heartburn or change in bowel habits Skin: Denies abnormal skin rashes Lymphatics: Denies new lymphadenopathy or easy bruising Neurological:Denies numbness, tingling or new weaknesses Behavioral/Psych: Mood is stable, no new changes  All other systems were reviewed with the patient and are negative.  I have reviewed the past medical history, past surgical history, social history and family history with the patient and they are unchanged from previous note.  ALLERGIES:  has No Known  Allergies.  MEDICATIONS:  Current Outpatient Prescriptions  Medication Sig Dispense Refill  . atorvastatin (LIPITOR) 40 MG tablet Take 1 tablet (40 mg total) by mouth daily. 30  tablet 2  . cyclobenzaprine (FLEXERIL) 10 MG tablet Take 1 tablet (10 mg total) by mouth at bedtime. 30 tablet 1  . emollient (BIAFINE) cream Apply topically as needed.    . gabapentin (NEURONTIN) 300 MG capsule Take 1 capsule (300 mg total) by mouth 3 (three) times daily. 90 capsule 3  . lidocaine (XYLOCAINE) 2 % solution Patient: Mix 1part 2% viscous lidocaine, 1part H20. Swish & swallow 39mL of diluted mixture, 52min before meals and at bedtime, up to QID 100 mL 5  . loratadine (CLARITIN) 10 MG tablet TAKE ONE TABLET BY MOUTH ONCE DAILY 90 tablet 3  . LORazepam (ATIVAN) 0.5 MG tablet Take one tablet 20-30 minutes before radiotherapy PRN anxiety/ claustrophobia. 35 tablet 0  . morphine (MSIR) 15 MG tablet Take 1 tablet (15 mg total) by mouth every 6 (six) hours as needed for severe pain. 60 tablet 0  . Nutritional Supplements (FEEDING SUPPLEMENT, OSMOLITE 1.5 CAL,) LIQD Begin Osmolite 1.5 via PEG TID with 60 mL free water. Advance as tolerated to goal of 1 1/2 bottles QID with 60 mL free water before and after. In addition, drink or flush PEG with 240 mL free water TID. 6 Bottle 11  . senna-docusate (SENOKOT-S) 8.6-50 MG tablet Take 1 tablet by mouth 2 (two) times daily. 60 tablet 3  . diclofenac sodium (VOLTAREN) 1 % GEL Apply 2 g topically 4 (four) times daily. (Patient not taking: Reported on 05/17/2016) 1 Tube 2  . lidocaine-prilocaine (EMLA) cream Apply to affected area once (Patient not taking: Reported on 06/28/2016) 30 g 3  . ondansetron (ZOFRAN) 8 MG tablet Take 1 tablet (8 mg total) by mouth 2 (two) times daily as needed. Start on the third day after chemotherapy. (Patient not taking: Reported on 06/28/2016) 30 tablet 1  . prochlorperazine (COMPAZINE) 10 MG tablet Take 1 tablet (10 mg total) by mouth every 6 (six) hours as needed (Nausea or vomiting). (Patient not taking: Reported on 06/28/2016) 30 tablet 1   No current facility-administered medications for this visit.     PHYSICAL  EXAMINATION: ECOG PERFORMANCE STATUS: 1 - Symptomatic but completely ambulatory  Vitals:   07/13/16 1533  BP: 125/83  Pulse: 99  Resp: 20  Temp: 98.9 F (37.2 C)   Filed Weights   07/13/16 1533  Weight: 154 lb 11.2 oz (70.2 kg)    GENERAL:alert, no distress and comfortable SKIN: skin color, texture, turgor are normal, no rashes or significant lesions EYES: normal, Conjunctiva are pink and non-injected, sclera clear OROPHARYNX:no exudate, no erythema and lips, buccal mucosa, and tongue normal  NECK: supple, thyroid normal size, non-tender, without nodularity LYMPH: Neck lymphadenopathy has reduced in size  LUNGS: clear to auscultation and percussion with normal breathing effort HEART: regular rate & rhythm and no murmurs and no lower extremity edema ABDOMEN:abdomen soft, non-tender and normal bowel sounds Musculoskeletal:no cyanosis of digits and no clubbing  NEURO: alert & oriented x 3 with fluent speech, no focal motor/sensory deficits  LABORATORY DATA:  I have reviewed the data as listed    Component Value Date/Time   NA 143 07/11/2016 1350   K 3.7 07/11/2016 1350   CL 97 (L) 06/23/2016 0954   CO2 32 (H) 07/11/2016 1350   GLUCOSE 110 07/11/2016 1350   BUN 8.6 07/11/2016 1350   CREATININE 0.9 07/11/2016  1350   CALCIUM 9.4 07/11/2016 1350   PROT 6.7 07/11/2016 1350   ALBUMIN 3.4 (L) 07/11/2016 1350   AST 17 07/11/2016 1350   ALT 17 07/11/2016 1350   ALKPHOS 67 07/11/2016 1350   BILITOT 0.40 07/11/2016 1350   GFRNONAA >60 06/23/2016 0954   GFRAA >60 06/23/2016 0954    No results found for: SPEP, UPEP  Lab Results  Component Value Date   WBC 6.1 07/11/2016   NEUTROABS 5.1 07/11/2016   HGB 12.9 (L) 07/11/2016   HCT 38.8 07/11/2016   MCV 82.4 07/11/2016   PLT 167 07/11/2016      Chemistry      Component Value Date/Time   NA 143 07/11/2016 1350   K 3.7 07/11/2016 1350   CL 97 (L) 06/23/2016 0954   CO2 32 (H) 07/11/2016 1350   BUN 8.6 07/11/2016 1350    CREATININE 0.9 07/11/2016 1350      Component Value Date/Time   CALCIUM 9.4 07/11/2016 1350   ALKPHOS 67 07/11/2016 1350   AST 17 07/11/2016 1350   ALT 17 07/11/2016 1350   BILITOT 0.40 07/11/2016 1350       RADIOGRAPHIC STUDIES: I have personally reviewed the radiological images as listed and agreed with the findings in the report. Ir Gastrostomy Tube Mod Sed  Result Date: 06/23/2016 INDICATION: Laryngeal cancer EXAM: TWENTY FRENCH PULL-THROUGH GASTROSTOMY Date:  5/11/20185/12/2016 12:17 pm Radiologist:  M. Daryll Brod, MD Guidance:  Fluoroscopic MEDICATIONS: Ancef 2 g; Antibiotics were administered within 1 hour of the procedure. Glucagon 0.5 mg IV ANESTHESIA/SEDATION: Versed 1.0 mg IV; Fentanyl 25 mcg IV Moderate Sedation Time:  10 minutes The patient was continuously monitored during the procedure by the interventional radiology nurse under my direct supervision. CONTRAST:  10 cc Isovue-300 - administered into the gastric lumen. FLUOROSCOPY TIME:  Fluoroscopy Time: 2 minutes 48 seconds (56 mGy). COMPLICATIONS: None immediate. PROCEDURE: Informed consent was obtained from the patient following explanation of the procedure, risks, benefits and alternatives. The patient understands, agrees and consents for the procedure. All questions were addressed. A time out was performed. Maximal barrier sterile technique utilized including caps, mask, sterile gowns, sterile gloves, large sterile drape, hand hygiene, and betadine prep. The left upper quadrant was sterilely prepped and draped. An oral gastric catheter was inserted into the stomach under fluoroscopy. The existing nasogastric feeding tube was removed. Air was injected into the stomach for insufflation and visualization under fluoroscopy. The air distended stomach was confirmed beneath the anterior abdominal wall in the frontal and lateral projections. Under sterile conditions and local anesthesia, a 49 gauge trocar needle was utilized to access  the stomach percutaneously beneath the left subcostal margin. Needle position was confirmed within the stomach under biplane fluoroscopy. Contrast injection confirmed position also. A single T tack was deployed for gastropexy. Over an Amplatz guide wire, a 9-French sheath was inserted into the stomach. A snare device was utilized to capture the oral gastric catheter. The snare device was pulled retrograde from the stomach up the esophagus and out the oropharynx. The 20-French pull-through gastrostomy was connected to the snare device and pulled antegrade through the oropharynx down the esophagus into the stomach and then through the percutaneous tract external to the patient. The gastrostomy was assembled externally. Contrast injection confirms position in the stomach. Images were obtained for documentation. The patient tolerated procedure well. No immediate complication. IMPRESSION: Fluoroscopic insertion of a 20-French "pull-through" gastrostomy. Electronically Signed   By: Jerilynn Mages.  Shick M.D.   On: 06/23/2016  12:23   Ir US Guide Vasc Access Right  Result Date: 06/23/2016 CLINICAL DATA:  Laryngeal cancer EXAM: RIGHT INTERNAL JUGULAR SINGLE LUMEN POWER PORT CATHETER INSERTION Date:  5/11/20185/12/2016 12:14 pm Radiologist:  M. Daryll Brod, MD Guidance:  Ultrasound and fluoroscopic MEDICATIONS: Ancef 2 g; The antibiotic was administered within an appropriate time interval prior to skin puncture. ANESTHESIA/SEDATION: Versed 2.0 mg IV; Fentanyl 50 mcg IV; Moderate Sedation Time:  25 minutes The patient was continuously monitored during the procedure by the interventional radiology nurse under my direct supervision. FLUOROSCOPY TIME:  36 seconds (6 mGy) COMPLICATIONS: None immediate. CONTRAST:  None. PROCEDURE: Informed consent was obtained from the patient following explanation of the procedure, risks, benefits and alternatives. The patient understands, agrees and consents for the procedure. All questions were  addressed. A time out was performed. Maximal barrier sterile technique utilized including caps, mask, sterile gowns, sterile gloves, large sterile drape, hand hygiene, and 2% chlorhexidine scrub. Under sterile conditions and local anesthesia, right internal jugular micropuncture venous access was performed. Access was performed with ultrasound. Images were obtained for documentation. A guide wire was inserted followed by a transitional dilator. This allowed insertion of a guide wire and catheter into the IVC. Measurements were obtained from the SVC / RA junction back to the right IJ venotomy site. In the right infraclavicular chest, a subcutaneous pocket was created over the second anterior rib. This was done under sterile conditions and local anesthesia. 1% lidocaine with epinephrine was utilized for this. A 2.5 cm incision was made in the skin. Blunt dissection was performed to create a subcutaneous pocket over the right pectoralis major muscle. The pocket was flushed with saline vigorously. There was adequate hemostasis. The port catheter was assembled and checked for leakage. The port catheter was secured in the pocket with two retention sutures. The tubing was tunneled subcutaneously to the right venotomy site and inserted into the SVC/RA junction through a valved peel-away sheath. Position was confirmed with fluoroscopy. Images were obtained for documentation. The patient tolerated the procedure well. No immediate complications. Incisions were closed in a two layer fashion with 4 - 0 Vicryl suture. Dermabond was applied to the skin. The port catheter was accessed, blood was aspirated followed by saline and heparin flushes. Needle was removed. A dry sterile dressing was applied. IMPRESSION: Ultrasound and fluoroscopically guided right internal jugular single lumen power port catheter insertion. Tip in the SVC/RA junction. Catheter ready for use. Electronically Signed   By: Jerilynn Mages.  Shick M.D.   On: 06/23/2016 12:25    Ir Fluoro Guide Port Insertion Right  Result Date: 06/23/2016 CLINICAL DATA:  Laryngeal cancer EXAM: RIGHT INTERNAL JUGULAR SINGLE LUMEN POWER PORT CATHETER INSERTION Date:  5/11/20185/12/2016 12:14 pm Radiologist:  M. Daryll Brod, MD Guidance:  Ultrasound and fluoroscopic MEDICATIONS: Ancef 2 g; The antibiotic was administered within an appropriate time interval prior to skin puncture. ANESTHESIA/SEDATION: Versed 2.0 mg IV; Fentanyl 50 mcg IV; Moderate Sedation Time:  25 minutes The patient was continuously monitored during the procedure by the interventional radiology nurse under my direct supervision. FLUOROSCOPY TIME:  36 seconds (6 mGy) COMPLICATIONS: None immediate. CONTRAST:  None. PROCEDURE: Informed consent was obtained from the patient following explanation of the procedure, risks, benefits and alternatives. The patient understands, agrees and consents for the procedure. All questions were addressed. A time out was performed. Maximal barrier sterile technique utilized including caps, mask, sterile gowns, sterile gloves, large sterile drape, hand hygiene, and 2% chlorhexidine scrub. Under sterile conditions and local  anesthesia, right internal jugular micropuncture venous access was performed. Access was performed with ultrasound. Images were obtained for documentation. A guide wire was inserted followed by a transitional dilator. This allowed insertion of a guide wire and catheter into the IVC. Measurements were obtained from the SVC / RA junction back to the right IJ venotomy site. In the right infraclavicular chest, a subcutaneous pocket was created over the second anterior rib. This was done under sterile conditions and local anesthesia. 1% lidocaine with epinephrine was utilized for this. A 2.5 cm incision was made in the skin. Blunt dissection was performed to create a subcutaneous pocket over the right pectoralis major muscle. The pocket was flushed with saline vigorously. There was adequate  hemostasis. The port catheter was assembled and checked for leakage. The port catheter was secured in the pocket with two retention sutures. The tubing was tunneled subcutaneously to the right venotomy site and inserted into the SVC/RA junction through a valved peel-away sheath. Position was confirmed with fluoroscopy. Images were obtained for documentation. The patient tolerated the procedure well. No immediate complications. Incisions were closed in a two layer fashion with 4 - 0 Vicryl suture. Dermabond was applied to the skin. The port catheter was accessed, blood was aspirated followed by saline and heparin flushes. Needle was removed. A dry sterile dressing was applied. IMPRESSION: Ultrasound and fluoroscopically guided right internal jugular single lumen power port catheter insertion. Tip in the SVC/RA junction. Catheter ready for use. Electronically Signed   By: Jerilynn Mages.  Shick M.D.   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  All questions were answered. The patient knows to call the clinic with any problems, questions or concerns. No barriers to learning was detected. I spent 15 minutes counseling the patient face to face. The total time spent in the appointment was 20 minutes and more than 50% was  on counseling and review of test results     [name], MD 07/14/2016 2:07 PM

## 2016-07-14 NOTE — Assessment & Plan Note (Signed)
This is much improved since chemotherapy He will continue to take morphine sulfate as needed

## 2016-07-14 NOTE — Assessment & Plan Note (Signed)
The neck mass is smaller in size He has positive response to treatment Continue to monitor closely

## 2016-07-14 NOTE — Assessment & Plan Note (Signed)
He tolerated treatment well His pain is less and the neck swelling has improved We will continue treatment without dose adjustment and I will continue to see him on a weekly basis for supportive care

## 2016-07-17 ENCOUNTER — Other Ambulatory Visit (HOSPITAL_BASED_OUTPATIENT_CLINIC_OR_DEPARTMENT_OTHER): Payer: Medicare Other

## 2016-07-17 ENCOUNTER — Ambulatory Visit: Payer: Medicare Other | Admitting: Radiation Oncology

## 2016-07-17 ENCOUNTER — Encounter: Payer: Self-pay | Admitting: Radiation Oncology

## 2016-07-17 ENCOUNTER — Ambulatory Visit
Admission: RE | Admit: 2016-07-17 | Discharge: 2016-07-17 | Disposition: A | Discharge status: Home / Self Care | Payer: Medicare Other | Source: Ambulatory Visit | Attending: Radiation Oncology | Admitting: Radiation Oncology

## 2016-07-17 VITALS — Temp 98.7°F | Ht 63.0 in | Wt 150.8 lb

## 2016-07-17 DIAGNOSIS — C321 Malignant neoplasm of supraglottis: Secondary | ICD-10-CM

## 2016-07-17 DIAGNOSIS — C770 Secondary and unspecified malignant neoplasm of lymph nodes of head, face and neck: Secondary | ICD-10-CM

## 2016-07-17 LAB — COMPREHENSIVE METABOLIC PANEL
ALT: 16 U/L (ref 0–55)
ANION GAP: 11 meq/L (ref 3–11)
AST: 18 U/L (ref 5–34)
Albumin: 3.7 g/dL (ref 3.5–5.0)
Alkaline Phosphatase: 69 U/L (ref 40–150)
BUN: 8.8 mg/dL (ref 7.0–26.0)
CALCIUM: 10 mg/dL (ref 8.4–10.4)
CO2: 31 mEq/L — ABNORMAL HIGH (ref 22–29)
Chloride: 102 mEq/L (ref 98–109)
Creatinine: 0.8 mg/dL (ref 0.7–1.3)
EGFR: >90 mL/min/{1.73_m2} (ref >90)
GLUCOSE: 97 mg/dL (ref 70–140)
POTASSIUM: 4.1 meq/L (ref 3.5–5.1)
Sodium: 143 mEq/L (ref 136–145)
Total Bilirubin: 0.69 mg/dL (ref 0.20–1.20)
Total Protein: 7.2 g/dL (ref 6.4–8.3)

## 2016-07-17 LAB — CBC WITH DIFFERENTIAL/PLATELET
BASO%: 0 % (ref 0.0–2.0)
Basophils Absolute: 0 10*3/uL (ref 0.0–0.1)
EOS%: 1.8 % (ref 0.0–7.0)
Eosinophils Absolute: 0.1 10*3/uL (ref 0.0–0.5)
HEMATOCRIT: 37.2 % — AB (ref 38.4–49.9)
HGB: 12.4 g/dL — ABNORMAL LOW (ref 13.0–17.1)
LYMPH#: 0.3 10*3/uL — AB (ref 0.9–3.3)
LYMPH%: 9.2 % — ABNORMAL LOW (ref 14.0–49.0)
MCH: 27.6 pg (ref 27.2–33.4)
MCHC: 33.3 g/dL (ref 32.0–36.0)
MCV: 82.7 fL (ref 79.3–98.0)
MONO#: 0.5 10*3/uL (ref 0.1–0.9)
MONO%: 15.5 % — ABNORMAL HIGH (ref 0.0–14.0)
NEUT%: 73.5 % (ref 39.0–75.0)
NEUTROS ABS: 2.5 10*3/uL (ref 1.5–6.5)
PLATELETS: 236 10*3/uL (ref 140–400)
RBC: 4.5 10*6/uL (ref 4.20–5.82)
RDW: 14.6 % (ref 11.0–14.6)
WBC: 3.4 10*3/uL — ABNORMAL LOW (ref 4.0–10.3)

## 2016-07-17 LAB — MAGNESIUM: Magnesium: 2.2 mg/dl (ref 1.5–2.5)

## 2016-07-17 NOTE — Progress Notes (Signed)
   Weekly Management Note:  Outpatient   ICD-9-CM ICD-10-CM   1. 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[facility]) 161.1 C32.1     Current Dose:  26 Gy to H+N region; lung treatment SBRT s/p two fxs Projected Dose: 70 Gy to H+N region   Narrative:  The patient presents for routine under treatment assessment.  CBCT/MVCT images/Port film x-rays were reviewed.  The chart was checked.  More throat pain, needs to pick up pain meds.  Acknowledges he needs to take in more by PEG. Able to swallow water.     Physical Findings:  Wt Readings from Last 3 Encounters:  07/17/16 150 lb 12.8 oz (68.4 kg)  07/13/16 154 lb 11.2 oz (70.2 kg)  07/11/16 154 lb 12.8 oz (70.2 kg)    height is 5\' 3"  (1.6 m) and weight is 150 lb 12.8 oz (68.4 kg). His temperature is 98.7 F (37.1 C). His oxygen saturation is 97%.   +erythema in the throat + patchy mucositis. Skin over neck is intact  CBC    Component Value Date/Time   WBC 3.4 (L) 07/17/2016 1342   WBC 6.7 06/23/2016 0954   RBC 4.50 07/17/2016 1342   RBC 4.93 06/23/2016 0954   HGB 12.4 (L) 07/17/2016 1342   HCT 37.2 (L) 07/17/2016 1342   PLT 236 07/17/2016 1342   MCV 82.7 07/17/2016 1342   MCH 27.6 07/17/2016 1342   MCH 28.4 06/23/2016 0954   MCHC 33.3 07/17/2016 1342   MCHC 34.0 06/23/2016 0954   RDW 14.6 07/17/2016 1342   LYMPHSABS 0.3 (L) 07/17/2016 1342   MONOABS 0.5 07/17/2016 1342   EOSABS 0.1 07/17/2016 1342   BASOSABS 0.0 07/17/2016 1342   CMP     Component Value Date/Time   NA 143 07/17/2016 1341   K 4.1 07/17/2016 1341   CL 97 (L) 06/23/2016 0954   CO2 31 (H) 07/17/2016 1341   GLUCOSE 97 07/17/2016 1341   BUN 8.8 07/17/2016 1341   CREATININE 0.8 07/17/2016 1341   CALCIUM 10.0 07/17/2016 1341   PROT 7.2 07/17/2016 1341   ALBUMIN 3.7 07/17/2016 1341   AST 18 07/17/2016 1341   ALT 16 07/17/2016 1341   ALKPHOS 69 07/17/2016 1341   BILITOT 0.69 07/17/2016 1341   GFRNONAA >60 06/23/2016 0954   GFRAA >60 06/23/2016 0954     Impression:  The  patient is tolerating radiotherapy.   Plan:  Continue radiotherapy as planned. Patient may continue to use Lidocaine and morphine as directed for pain. Use Rxs as recommended.  Push intake via PEG to increase weight.  -----------------------------------  [name], MD  This document serves as a record of services personally performed by [name], MD. It was created on her behalf by Maryla Morrow, a trained medical scribe. The creation of this record is based on the scribe's personal observations and the provider's statements to them. This document has been checked and approved by the attending provider.

## 2016-07-17 NOTE — Progress Notes (Signed)
Antonio [name] presents for his 13th fraction of radiation to his Larynx and 2 fraction SBRT to his Right Lung. He denies pain currently, though he has throat pain at times. He has some fatigue. He reports difficulty swallowing, he feels like food and pills get stuck in his throat. He is able to swallow water. He adjusts his PEG tube feedings based on his oral intake. He is instilling  3-4 cans of osmolite daily at this time. He is not drinking much water and tells me that he he drinks about 4 small glasses of water daily. He is aware that he can instill water in his feeding tube. The skin to his radiation site is intact, and he continues to use biafine twice daily as directed.   Temp 98.7 F (37.1 C)   Ht 5\' 3"  (1.6 m)   Wt 150 lb 12.8 oz (68.4 kg)   SpO2 97% Comment: room air  BMI 26.71 kg/m    Orthostatics: BP sitting 129/83, pulse 91, BP standing 136/91, pulse 94  Wt Readings from Last 3 Encounters:  07/17/16 150 lb 12.8 oz (68.4 kg)  07/13/16 154 lb 11.2 oz (70.2 kg)  07/11/16 154 lb 12.8 oz (70.2 kg)

## 2016-07-18 ENCOUNTER — Ambulatory Visit
Admission: RE | Admit: 2016-07-18 | Discharge: 2016-07-18 | Disposition: A | Discharge status: Home / Self Care | Payer: Medicare Other | Source: Ambulatory Visit | Attending: Radiation Oncology | Admitting: Radiation Oncology

## 2016-07-18 ENCOUNTER — Ambulatory Visit: Payer: Medicare Other | Admitting: Radiation Oncology

## 2016-07-18 DIAGNOSIS — C3431 Malignant neoplasm of lower lobe, right bronchus or lung: Secondary | ICD-10-CM | POA: Diagnosis not present

## 2016-07-18 DIAGNOSIS — K5900 Constipation, unspecified: Secondary | ICD-10-CM | POA: Diagnosis not present

## 2016-07-18 DIAGNOSIS — K219 Gastro-esophageal reflux disease without esophagitis: Secondary | ICD-10-CM | POA: Diagnosis not present

## 2016-07-18 DIAGNOSIS — E7800 Pure hypercholesterolemia, unspecified: Secondary | ICD-10-CM | POA: Diagnosis not present

## 2016-07-18 DIAGNOSIS — Z510 Encounter for antineoplastic radiation therapy: Secondary | ICD-10-CM | POA: Diagnosis not present

## 2016-07-18 DIAGNOSIS — C321 Malignant neoplasm of supraglottis: Secondary | ICD-10-CM | POA: Diagnosis not present

## 2016-07-18 DIAGNOSIS — I10 Essential (primary) hypertension: Secondary | ICD-10-CM | POA: Diagnosis not present

## 2016-07-19 ENCOUNTER — Ambulatory Visit
Admission: RE | Admit: 2016-07-19 | Discharge: 2016-07-19 | Disposition: A | Discharge status: Home / Self Care | Payer: Medicare Other | Source: Ambulatory Visit | Attending: Radiation Oncology | Admitting: Radiation Oncology

## 2016-07-19 DIAGNOSIS — K219 Gastro-esophageal reflux disease without esophagitis: Secondary | ICD-10-CM | POA: Diagnosis not present

## 2016-07-19 DIAGNOSIS — C3431 Malignant neoplasm of lower lobe, right bronchus or lung: Secondary | ICD-10-CM | POA: Diagnosis not present

## 2016-07-19 DIAGNOSIS — I10 Essential (primary) hypertension: Secondary | ICD-10-CM | POA: Diagnosis not present

## 2016-07-19 DIAGNOSIS — E7800 Pure hypercholesterolemia, unspecified: Secondary | ICD-10-CM | POA: Diagnosis not present

## 2016-07-19 DIAGNOSIS — C321 Malignant neoplasm of supraglottis: Secondary | ICD-10-CM | POA: Diagnosis not present

## 2016-07-19 DIAGNOSIS — Z510 Encounter for antineoplastic radiation therapy: Secondary | ICD-10-CM | POA: Diagnosis not present

## 2016-07-19 DIAGNOSIS — K5900 Constipation, unspecified: Secondary | ICD-10-CM | POA: Diagnosis not present

## 2016-07-20 ENCOUNTER — Ambulatory Visit: Payer: Medicare Other | Admitting: Nutrition

## 2016-07-20 ENCOUNTER — Ambulatory Visit (HOSPITAL_BASED_OUTPATIENT_CLINIC_OR_DEPARTMENT_OTHER): Payer: Medicare Other | Admitting: Hematology and Oncology

## 2016-07-20 ENCOUNTER — Encounter: Payer: Self-pay | Admitting: Hematology and Oncology

## 2016-07-20 ENCOUNTER — Telehealth: Payer: Self-pay | Admitting: Nutrition

## 2016-07-20 ENCOUNTER — Ambulatory Visit
Admission: RE | Admit: 2016-07-20 | Discharge: 2016-07-20 | Disposition: A | Discharge status: Home / Self Care | Payer: Medicare Other | Source: Ambulatory Visit | Attending: Radiation Oncology | Admitting: Radiation Oncology

## 2016-07-20 DIAGNOSIS — C321 Malignant neoplasm of supraglottis: Secondary | ICD-10-CM | POA: Diagnosis not present

## 2016-07-20 DIAGNOSIS — K5909 Other constipation: Secondary | ICD-10-CM

## 2016-07-20 DIAGNOSIS — C3431 Malignant neoplasm of lower lobe, right bronchus or lung: Secondary | ICD-10-CM | POA: Diagnosis not present

## 2016-07-20 DIAGNOSIS — K5900 Constipation, unspecified: Secondary | ICD-10-CM | POA: Diagnosis not present

## 2016-07-20 DIAGNOSIS — R634 Abnormal weight loss: Secondary | ICD-10-CM

## 2016-07-20 DIAGNOSIS — I10 Essential (primary) hypertension: Secondary | ICD-10-CM | POA: Diagnosis not present

## 2016-07-20 DIAGNOSIS — E7800 Pure hypercholesterolemia, unspecified: Secondary | ICD-10-CM | POA: Diagnosis not present

## 2016-07-20 DIAGNOSIS — Z510 Encounter for antineoplastic radiation therapy: Secondary | ICD-10-CM | POA: Diagnosis not present

## 2016-07-20 DIAGNOSIS — G893 Neoplasm related pain (acute) (chronic): Secondary | ICD-10-CM | POA: Diagnosis not present

## 2016-07-20 DIAGNOSIS — K219 Gastro-esophageal reflux disease without esophagitis: Secondary | ICD-10-CM | POA: Diagnosis not present

## 2016-07-20 DIAGNOSIS — R630 Anorexia: Secondary | ICD-10-CM

## 2016-07-20 MED FILL — MORPHINE SULFATE IR 15 MG T: 15 | 15 days supply | Qty: 60 | Fill #0

## 2016-07-20 NOTE — Telephone Encounter (Signed)
error 

## 2016-07-20 NOTE — Assessment & Plan Note (Signed)
He tolerated treatment well He has experienced some expected side effects from treatment I will continue to see him on a weekly basis and he will continue chemotherapy as prescribed Clinically, he has positive response to treatment

## 2016-07-20 NOTE — Progress Notes (Signed)
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 LEFT neck lymphadenopathy including LEFT level IIa to level 3 4.7 x 4.6 x 7.6 cm necrotic nodal conglomeration with suspected extracapsular spread of disease. Moderately narrowed hypopharynx.       05/18/2016 Procedure    Technically successful ultrasound-guided core biopsy of left cervical adenopathy.       05/18/2016 Pathology Results    Lymph node, needle/core biopsy, L cerv LAN - SQUAMOUS CELL CARCINOMA, SEE COMMENT. Microscopic Comment Residual lymph node is not seen. There is tumor invading skeletal muscle. p16 weakly positive      06/06/2016 Procedure    He had dental extraction      06/08/2016 PET scan    1. 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 2. Hypermetabolic right lower lobe pulmonary nodule is favored to represent a synchronous primary bronchogenic neoplasm. Isolated pulmonary metastasis could look similar. No thoracic nodal hypermetabolism. 3. Soft tissue hypermetabolism about the right iliac bone and greater trochanter of the proximal right femur are favored to be degenerative or posttraumatic. Soft tissue metastasis felt highly unlikely. 4.  Coronary artery atherosclerosis.  Aortic atherosclerosis. 5. A tiny right lower lobe pulmonary nodule is most likely a subpleural lymph node.      06/15/2016 Procedure    He has near normal baseline hearing test      06/23/2016 Procedure    Ultrasound and fluoroscopically guided right internal jugular single lumen power port catheter insertion. Tip in the SVC/RA junction. Catheter ready for use      06/23/2016 Procedure    Fluoroscopic insertion of a 20-French "pull-through" gastrostomy.      06/28/2016 -  Radiation Therapy    He received radiation      06/30/2016 -  Chemotherapy    He received chemotherapy        INTERVAL HISTORY: Please see below for problem oriented charting. He is seen as part of his weekly supportive care visit He has lost some weight due to dysphagia His pain is well controlled with current prescription pain medicine He had mild constipation Denies recent nausea He has started to use his feeding tube He denies recent smoking.  REVIEW OF SYSTEMS:   Constitutional: Denies fevers, chills or abnormal weight loss Eyes: Denies blurriness of vision Respiratory: Denies cough, dyspnea or wheezes Cardiovascular: Denies palpitation, chest discomfort or lower extremity swelling Skin: Denies abnormal skin rashes Lymphatics: Denies new lymphadenopathy or easy bruising Neurological:Denies numbness, tingling or new weaknesses Behavioral/Psych: Mood is stable, no new changes  All other systems were reviewed with the patient and are negative.  I have reviewed the past medical history, past surgical history, social history and family history with the patient and they are unchanged from previous note.  ALLERGIES:  has No Known Allergies.  MEDICATIONS:  Current Outpatient Prescriptions  Medication Sig Dispense Refill  .  atorvastatin (LIPITOR) 40 MG tablet Take 1 tablet (40 mg total) by mouth daily. 30 tablet 2  . cyclobenzaprine (FLEXERIL) 10 MG tablet Take 1 tablet (10 mg total) by mouth at  bedtime. 30 tablet 1  . diclofenac sodium (VOLTAREN) 1 % GEL Apply 2 g topically 4 (four) times daily. (Patient not taking: Reported on 05/17/2016) 1 Tube 2  . emollient (BIAFINE) cream Apply topically as needed.    [name] ALLERGY RELIEF 10 MG tablet TAKE ONE TABLET BY MOUTH ONCE DAILY 90 tablet 3  . gabapentin (NEURONTIN) 300 MG capsule Take 1 capsule (300 mg total) by mouth 3 (three) times daily. 90 capsule 3  . lidocaine (XYLOCAINE) 2 % solution Patient: Mix 1part 2% viscous lidocaine, 1part H20. Swish & swallow 69mL of diluted mixture, 74min before meals and at bedtime, up to QID 100 mL 5  . lidocaine-prilocaine (EMLA) cream Apply to affected area once (Patient not taking: Reported on 06/28/2016) 30 g 3  . LORazepam (ATIVAN) 0.5 MG tablet Take one tablet 20-30 minutes before radiotherapy PRN anxiety/ claustrophobia. 35 tablet 0  . morphine (MSIR) 15 MG tablet Take 1 tablet (15 mg total) by mouth every 6 (six) hours as needed for severe pain. 60 tablet 0  . Nutritional Supplements (FEEDING SUPPLEMENT, OSMOLITE 1.5 CAL,) LIQD Begin Osmolite 1.5 via PEG TID with 60 mL free water. Advance as tolerated to goal of 1 1/2 bottles QID with 60 mL free water before and after. In addition, drink or flush PEG with 240 mL free water TID. 6 Bottle 11  . ondansetron (ZOFRAN) 8 MG tablet Take 1 tablet (8 mg total) by mouth 2 (two) times daily as needed. Start on the third day after chemotherapy. (Patient not taking: Reported on 06/28/2016) 30 tablet 1  . prochlorperazine (COMPAZINE) 10 MG tablet Take 1 tablet (10 mg total) by mouth every 6 (six) hours as needed (Nausea or vomiting). (Patient not taking: Reported on 06/28/2016) 30 tablet 1  . senna-docusate (SENOKOT-S) 8.6-50 MG tablet Take 1 tablet by mouth 2 (two) times daily. 60 tablet 3   No current facility-administered medications for this visit.     PHYSICAL EXAMINATION: ECOG PERFORMANCE STATUS: 1 - Symptomatic but completely ambulatory  Vitals:   07/20/16  1343  BP: 127/78  Pulse: 93  Resp: 18  Temp: 99.1 F (37.3 C)   Filed Weights   07/20/16 1343  Weight: 149 lb (67.6 kg)    GENERAL:alert, no distress and comfortable SKIN: skin color, texture, turgor are normal, no rashes or significant lesions EYES: normal, Conjunctiva are pink and non-injected, sclera clear OROPHARYNX:no exudate, no erythema and lips, buccal mucosa, and tongue normal  NECK: supple, thyroid normal size, non-tender, without nodularity LYMPH: Previously palpable lymphadenopathy has reduced in size LUNGS: clear to auscultation and percussion with normal breathing effort HEART: regular rate & rhythm and no murmurs and no lower extremity edema ABDOMEN:abdomen soft, non-tender and normal bowel sounds Musculoskeletal:no cyanosis of digits and no clubbing  NEURO: alert & oriented x 3 with fluent speech, no focal motor/sensory deficits  LABORATORY DATA:  I have reviewed the data as listed    Component Value Date/Time   NA 143 07/17/2016 1341   K 4.1 07/17/2016 1341   CL 97 (L) 06/23/2016 0954   CO2 31 (H) 07/17/2016 1341   GLUCOSE 97 07/17/2016 1341   BUN 8.8 07/17/2016 1341   CREATININE 0.8 07/17/2016 1341   CALCIUM 10.0 07/17/2016 1341   PROT 7.2 07/17/2016 1341  ALBUMIN 3.7 07/17/2016 1341   AST 18 07/17/2016 1341   ALT 16 07/17/2016 1341   ALKPHOS 69 07/17/2016 1341   BILITOT 0.69 07/17/2016 1341   GFRNONAA >60 06/23/2016 0954   GFRAA >60 06/23/2016 0954    No results found for: SPEP, UPEP  Lab Results  Component Value Date   WBC 3.4 (L) 07/17/2016   NEUTROABS 2.5 07/17/2016   HGB 12.4 (L) 07/17/2016   HCT 37.2 (L) 07/17/2016   MCV 82.7 07/17/2016   PLT 236 07/17/2016      Chemistry      Component Value Date/Time   NA 143 07/17/2016 1341   K 4.1 07/17/2016 1341   CL 97 (L) 06/23/2016 0954   CO2 31 (H) 07/17/2016 1341   BUN 8.8 07/17/2016 1341   CREATININE 0.8 07/17/2016 1341      Component Value Date/Time   CALCIUM 10.0 07/17/2016  1341   ALKPHOS 69 07/17/2016 1341   AST 18 07/17/2016 1341   ALT 16 07/17/2016 1341   BILITOT 0.69 07/17/2016 1341       RADIOGRAPHIC STUDIES: I have personally reviewed the radiological images as listed and agreed with the findings in the report. Ir Gastrostomy Tube Mod Sed  Result Date: 06/23/2016 INDICATION: Laryngeal cancer EXAM: TWENTY FRENCH PULL-THROUGH GASTROSTOMY Date:  5/11/20185/12/2016 12:17 pm Radiologist:  M. Daryll Brod, MD Guidance:  Fluoroscopic MEDICATIONS: Ancef 2 g; Antibiotics were administered within 1 hour of the procedure. Glucagon 0.5 mg IV ANESTHESIA/SEDATION: Versed 1.0 mg IV; Fentanyl 25 mcg IV Moderate Sedation Time:  10 minutes The patient was continuously monitored during the procedure by the interventional radiology nurse under my direct supervision. CONTRAST:  10 cc Isovue-300 - administered into the gastric lumen. FLUOROSCOPY TIME:  Fluoroscopy Time: 2 minutes 48 seconds (56 mGy). COMPLICATIONS: None immediate. PROCEDURE: Informed consent was obtained from the patient following explanation of the procedure, risks, benefits and alternatives. The patient understands, agrees and consents for the procedure. All questions were addressed. A time out was performed. Maximal barrier sterile technique utilized including caps, mask, sterile gowns, sterile gloves, large sterile drape, hand hygiene, and betadine prep. The left upper quadrant was sterilely prepped and draped. An oral gastric catheter was inserted into the stomach under fluoroscopy. The existing nasogastric feeding tube was removed. Air was injected into the stomach for insufflation and visualization under fluoroscopy. The air distended stomach was confirmed beneath the anterior abdominal wall in the frontal and lateral projections. Under sterile conditions and local anesthesia, a 83 gauge trocar needle was utilized to access the stomach percutaneously beneath the left subcostal margin. Needle position was confirmed  within the stomach under biplane fluoroscopy. Contrast injection confirmed position also. A single T tack was deployed for gastropexy. Over an Amplatz guide wire, a 9-French sheath was inserted into the stomach. A snare device was utilized to capture the oral gastric catheter. The snare device was pulled retrograde from the stomach up the esophagus and out the oropharynx. The 20-French pull-through gastrostomy was connected to the snare device and pulled antegrade through the oropharynx down the esophagus into the stomach and then through the percutaneous tract external to the patient. The gastrostomy was assembled externally. Contrast injection confirms position in the stomach. Images were obtained for documentation. The patient tolerated procedure well. No immediate complication. IMPRESSION: Fluoroscopic insertion of a 20-French "pull-through" gastrostomy. Electronically Signed   By: Jerilynn Mages.  Shick M.D.   On: 06/23/2016 12:23   Ir US Guide Vasc Access Right  Result Date: 06/23/2016 CLINICAL  DATA:  Laryngeal cancer EXAM: RIGHT INTERNAL JUGULAR SINGLE LUMEN POWER PORT CATHETER INSERTION Date:  5/11/20185/12/2016 12:14 pm Radiologist:  M. Daryll Brod, MD Guidance:  Ultrasound and fluoroscopic MEDICATIONS: Ancef 2 g; The antibiotic was administered within an appropriate time interval prior to skin puncture. ANESTHESIA/SEDATION: Versed 2.0 mg IV; Fentanyl 50 mcg IV; Moderate Sedation Time:  25 minutes The patient was continuously monitored during the procedure by the interventional radiology nurse under my direct supervision. FLUOROSCOPY TIME:  36 seconds (6 mGy) COMPLICATIONS: None immediate. CONTRAST:  None. PROCEDURE: Informed consent was obtained from the patient following explanation of the procedure, risks, benefits and alternatives. The patient understands, agrees and consents for the procedure. All questions were addressed. A time out was performed. Maximal barrier sterile technique utilized including caps,  mask, sterile gowns, sterile gloves, large sterile drape, hand hygiene, and 2% chlorhexidine scrub. Under sterile conditions and local anesthesia, right internal jugular micropuncture venous access was performed. Access was performed with ultrasound. Images were obtained for documentation. A guide wire was inserted followed by a transitional dilator. This allowed insertion of a guide wire and catheter into the IVC. Measurements were obtained from the SVC / RA junction back to the right IJ venotomy site. In the right infraclavicular chest, a subcutaneous pocket was created over the second anterior rib. This was done under sterile conditions and local anesthesia. 1% lidocaine with epinephrine was utilized for this. A 2.5 cm incision was made in the skin. Blunt dissection was performed to create a subcutaneous pocket over the right pectoralis major muscle. The pocket was flushed with saline vigorously. There was adequate hemostasis. The port catheter was assembled and checked for leakage. The port catheter was secured in the pocket with two retention sutures. The tubing was tunneled subcutaneously to the right venotomy site and inserted into the SVC/RA junction through a valved peel-away sheath. Position was confirmed with fluoroscopy. Images were obtained for documentation. The patient tolerated the procedure well. No immediate complications. Incisions were closed in a two layer fashion with 4 - 0 Vicryl suture. Dermabond was applied to the skin. The port catheter was accessed, blood was aspirated followed by saline and heparin flushes. Needle was removed. A dry sterile dressing was applied. IMPRESSION: Ultrasound and fluoroscopically guided right internal jugular single lumen power port catheter insertion. Tip in the SVC/RA junction. Catheter ready for use. Electronically Signed   By: Jerilynn Mages.  Shick M.D.   On: 06/23/2016 12:25   Ir Fluoro Guide Port Insertion Right  Result Date: 06/23/2016 CLINICAL DATA:  Laryngeal  cancer EXAM: RIGHT INTERNAL JUGULAR SINGLE LUMEN POWER PORT CATHETER INSERTION Date:  5/11/20185/12/2016 12:14 pm Radiologist:  M. Daryll Brod, MD Guidance:  Ultrasound and fluoroscopic MEDICATIONS: Ancef 2 g; The antibiotic was administered within an appropriate time interval prior to skin puncture. ANESTHESIA/SEDATION: Versed 2.0 mg IV; Fentanyl 50 mcg IV; Moderate Sedation Time:  25 minutes The patient was continuously monitored during the procedure by the interventional radiology nurse under my direct supervision. FLUOROSCOPY TIME:  36 seconds (6 mGy) COMPLICATIONS: None immediate. CONTRAST:  None. PROCEDURE: Informed consent was obtained from the patient following explanation of the procedure, risks, benefits and alternatives. The patient understands, agrees and consents for the procedure. All questions were addressed. A time out was performed. Maximal barrier sterile technique utilized including caps, mask, sterile gowns, sterile gloves, large sterile drape, hand hygiene, and 2% chlorhexidine scrub. Under sterile conditions and local anesthesia, right internal jugular micropuncture venous access was performed. Access was performed with ultrasound.  Images were obtained for documentation. A guide wire was inserted followed by a transitional dilator. This allowed insertion of a guide wire and catheter into the IVC. Measurements were obtained from the SVC / RA junction back to the right IJ venotomy site. In the right infraclavicular chest, a subcutaneous pocket was created over the second anterior rib. This was done under sterile conditions and local anesthesia. 1% lidocaine with epinephrine was utilized for this. A 2.5 cm incision was made in the skin. Blunt dissection was performed to create a subcutaneous pocket over the right pectoralis major muscle. The pocket was flushed with saline vigorously. There was adequate hemostasis. The port catheter was assembled and checked for leakage. The port catheter was  secured in the pocket with two retention sutures. The tubing was tunneled subcutaneously to the right venotomy site and inserted into the SVC/RA junction through a valved peel-away sheath. Position was confirmed with fluoroscopy. Images were obtained for documentation. The patient tolerated the procedure well. No immediate complications. Incisions were closed in a two layer fashion with 4 - 0 Vicryl suture. Dermabond was applied to the skin. The port catheter was accessed, blood was aspirated followed by saline and heparin flushes. Needle was removed. A dry sterile dressing was applied. IMPRESSION: Ultrasound and fluoroscopically guided right internal jugular single lumen power port catheter insertion. Tip in the SVC/RA junction. Catheter ready for use. Electronically Signed   By: Jerilynn Mages.  Shick M.D.   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   No orders of the defined types were placed in this encounter.  All questions were answered. The patient knows to call the clinic with any problems, questions or concerns. No barriers to learning was detected. I spent 15 minutes counseling the patient face to face. The total time spent in the appointment was 20 minutes and more than 50% was on counseling and review of test results     [name], MD 07/20/2016 4:17 PM

## 2016-07-20 NOTE — Assessment & Plan Note (Signed)
I recommend regular laxative therapy.

## 2016-07-20 NOTE — Assessment & Plan Note (Signed)
This is much improved since chemotherapy He will continue to take morphine sulfate as needed

## 2016-07-20 NOTE — Progress Notes (Signed)
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 Weight decreased and documented as 149 pounds on June 11, down from 154 pounds May 29. Patient reports he is using his feeding tube with approximately 4 bottles Osmolite 1.5 without difficulty. He is not drinking Ensure Plus.  Estimated nutrition needs: 2040-2240 calories, 88-105 grams protein, 2.2 L fluid.  Nutrition diagnosis: Unintended weight loss continues.  Intervention: Enforced the importance of patient increasing Osmolite 1.5-1-1/2 bottles 4 times a day with 60 mL free water before and after bolus feedings. In addition patient will drink or flush PEG with 240 mL of free water 3 times a day. Encouraged patient to continue to eat by mouth as tolerated. Questions were answered.  Teach back method used.  Monitoring, evaluation, goals: Patient will tolerate oral intake plus tube feeding to minimize weight loss.  Next visit: Thursday, June 14.  **Disclaimer: This note was dictated with voice recognition software. Similar sounding words can inadvertently be transcribed and this note may contain transcription errors which may not have been corrected upon publication of note.**

## 2016-07-20 NOTE — Assessment & Plan Note (Signed)
Due to anorexia from cancer Recommend increase oral intake as tolerated and he has started to use his feeding tube

## 2016-07-21 ENCOUNTER — Ambulatory Visit
Admission: RE | Admit: 2016-07-21 | Discharge: 2016-07-21 | Disposition: A | Discharge status: Home / Self Care | Payer: Medicare Other | Source: Ambulatory Visit | Attending: Radiation Oncology | Admitting: Radiation Oncology

## 2016-07-21 ENCOUNTER — Ambulatory Visit: Payer: Medicare Other | Admitting: Radiation Oncology

## 2016-07-21 ENCOUNTER — Ambulatory Visit (HOSPITAL_BASED_OUTPATIENT_CLINIC_OR_DEPARTMENT_OTHER): Payer: Medicare Other

## 2016-07-21 VITALS — BP 124/77 | HR 100 | Temp 99.2°F | Resp 18

## 2016-07-21 DIAGNOSIS — Z5111 Encounter for antineoplastic chemotherapy: Secondary | ICD-10-CM | POA: Diagnosis not present

## 2016-07-21 DIAGNOSIS — C321 Malignant neoplasm of supraglottis: Secondary | ICD-10-CM

## 2016-07-21 DIAGNOSIS — E7800 Pure hypercholesterolemia, unspecified: Secondary | ICD-10-CM | POA: Diagnosis not present

## 2016-07-21 DIAGNOSIS — C329 Malignant neoplasm of larynx, unspecified: Secondary | ICD-10-CM | POA: Diagnosis not present

## 2016-07-21 DIAGNOSIS — C760 Malignant neoplasm of head, face and neck: Secondary | ICD-10-CM | POA: Diagnosis not present

## 2016-07-21 DIAGNOSIS — K219 Gastro-esophageal reflux disease without esophagitis: Secondary | ICD-10-CM | POA: Diagnosis not present

## 2016-07-21 DIAGNOSIS — K5900 Constipation, unspecified: Secondary | ICD-10-CM | POA: Diagnosis not present

## 2016-07-21 DIAGNOSIS — C3431 Malignant neoplasm of lower lobe, right bronchus or lung: Secondary | ICD-10-CM | POA: Diagnosis not present

## 2016-07-21 DIAGNOSIS — Z510 Encounter for antineoplastic radiation therapy: Secondary | ICD-10-CM | POA: Diagnosis not present

## 2016-07-21 DIAGNOSIS — I10 Essential (primary) hypertension: Secondary | ICD-10-CM | POA: Diagnosis not present

## 2016-07-21 DIAGNOSIS — I69391 Dysphagia following cerebral infarction: Secondary | ICD-10-CM | POA: Diagnosis not present

## 2016-07-21 MED ORDER — POTASSIUM CHLORIDE 2 MEQ/ML IV SOLN
Freq: Once | INTRAVENOUS | Status: AC
  Administered 2016-07-21: 11:00:00 via INTRAVENOUS
  Filled 2016-07-21: qty 1000

## 2016-07-21 MED ORDER — SODIUM CHLORIDE 0.9 % IV SOLN
100.0000 mg/m2 | Freq: Once | INTRAVENOUS | Status: AC
  Administered 2016-07-21: 184 mg via INTRAVENOUS
  Filled 2016-07-21: qty 184

## 2016-07-21 MED ORDER — HEPARIN SOD (PORK) LOCK FLUSH 100 UNIT/ML IV SOLN
500.0000 [IU] | Freq: Once | INTRAVENOUS | Status: AC | PRN
  Administered 2016-07-21: 500 [IU]
  Filled 2016-07-21: qty 5

## 2016-07-21 MED ORDER — POTASSIUM CHLORIDE 2 MEQ/ML IV SOLN: Freq: Once | INTRAVENOUS | Status: DC

## 2016-07-21 MED ORDER — PALONOSETRON HCL INJECTION 0.25 MG/5ML
0.2500 mg | Freq: Once | INTRAVENOUS | Status: AC
  Administered 2016-07-21: 0.25 mg via INTRAVENOUS

## 2016-07-21 MED ORDER — SODIUM CHLORIDE 0.9 % IV SOLN
Freq: Once | INTRAVENOUS | Status: DC
  Filled 2016-07-21: qty 1000

## 2016-07-21 MED ORDER — FOSAPREPITANT DIMEGLUMINE INJECTION 150 MG
Freq: Once | INTRAVENOUS | Status: AC
  Administered 2016-07-21: 14:00:00 via INTRAVENOUS
  Filled 2016-07-21: qty 5

## 2016-07-21 MED ORDER — PALONOSETRON HCL INJECTION 0.25 MG/5ML
INTRAVENOUS | Status: AC
  Filled 2016-07-21: qty 5

## 2016-07-21 MED ORDER — SODIUM CHLORIDE 0.9% FLUSH
10.0000 mL | INTRAVENOUS | Status: DC | PRN
  Administered 2016-07-21: 10 mL
  Filled 2016-07-21: qty 10

## 2016-07-21 NOTE — Patient Instructions (Addendum)
[facility] Discharge Instructions for Patients Receiving Chemotherapy  Today you received the following chemotherapy agent: Cisplatin.  To help prevent nausea and vomiting after your treatment, we encourage you to take your nausea medication as prescribed.   If you develop nausea and vomiting that is not controlled by your nausea medication, call the clinic.   BELOW ARE SYMPTOMS THAT SHOULD BE REPORTED IMMEDIATELY:  *FEVER GREATER THAN 100.5 F  *CHILLS WITH OR WITHOUT FEVER  NAUSEA AND VOMITING THAT IS NOT CONTROLLED WITH YOUR NAUSEA MEDICATION  *UNUSUAL SHORTNESS OF BREATH  *UNUSUAL BRUISING OR BLEEDING  TENDERNESS IN MOUTH AND THROAT WITH OR WITHOUT PRESENCE OF ULCERS  *URINARY PROBLEMS  *BOWEL PROBLEMS  UNUSUAL RASH Items with * indicate a potential emergency and should be followed up as soon as possible.  Feel free to call the clinic you have any questions or concerns. The clinic phone number is [phone].  Please show the [facility] at check-in to the Emergency Department and triage nurse.

## 2016-07-21 NOTE — Progress Notes (Signed)
Pt urine output 150 at this time. [name] aware and states ok to proceed with treatment.   1655: Pt states his ride home is at 1700 today and has no other transportation. Pt has only had total output of 300. Dr. Burr Medico at bedside to evaluate patient and states ok to stop IVF for pt to have ride home. Pt aware to bolus water in feeding tube tonight. Pt aware to increase fluid intake the next few days to help flush the chemotherapy. Pt aware and verbalized understanding. Pt had no further questions.

## 2016-07-23 ENCOUNTER — Ambulatory Visit: Payer: Medicare Other

## 2016-07-24 ENCOUNTER — Encounter: Payer: Self-pay | Admitting: Radiation Oncology

## 2016-07-24 ENCOUNTER — Ambulatory Visit
Admission: RE | Admit: 2016-07-24 | Discharge: 2016-07-24 | Disposition: A | Discharge status: Home / Self Care | Payer: Medicare Other | Source: Ambulatory Visit | Attending: Radiation Oncology | Admitting: Radiation Oncology

## 2016-07-24 ENCOUNTER — Other Ambulatory Visit (HOSPITAL_BASED_OUTPATIENT_CLINIC_OR_DEPARTMENT_OTHER): Payer: Medicare Other

## 2016-07-24 VITALS — BP 117/87 | HR 94 | Temp 99.1°F | Wt 147.8 lb

## 2016-07-24 DIAGNOSIS — E7800 Pure hypercholesterolemia, unspecified: Secondary | ICD-10-CM | POA: Diagnosis not present

## 2016-07-24 DIAGNOSIS — R634 Abnormal weight loss: Secondary | ICD-10-CM

## 2016-07-24 DIAGNOSIS — C3431 Malignant neoplasm of lower lobe, right bronchus or lung: Secondary | ICD-10-CM | POA: Diagnosis not present

## 2016-07-24 DIAGNOSIS — C770 Secondary and unspecified malignant neoplasm of lymph nodes of head, face and neck: Secondary | ICD-10-CM | POA: Diagnosis not present

## 2016-07-24 DIAGNOSIS — K5900 Constipation, unspecified: Secondary | ICD-10-CM | POA: Diagnosis not present

## 2016-07-24 DIAGNOSIS — Z510 Encounter for antineoplastic radiation therapy: Secondary | ICD-10-CM | POA: Diagnosis not present

## 2016-07-24 DIAGNOSIS — I10 Essential (primary) hypertension: Secondary | ICD-10-CM | POA: Diagnosis not present

## 2016-07-24 DIAGNOSIS — C321 Malignant neoplasm of supraglottis: Secondary | ICD-10-CM

## 2016-07-24 DIAGNOSIS — K219 Gastro-esophageal reflux disease without esophagitis: Secondary | ICD-10-CM | POA: Diagnosis not present

## 2016-07-24 LAB — COMPREHENSIVE METABOLIC PANEL
ALBUMIN: 3.6 g/dL (ref 3.5–5.0)
ALK PHOS: 65 U/L (ref 40–150)
ALT: 21 U/L (ref 0–55)
ANION GAP: 10 meq/L (ref 3–11)
AST: 19 U/L (ref 5–34)
BILIRUBIN TOTAL: 0.73 mg/dL (ref 0.20–1.20)
BUN: 22.9 mg/dL (ref 7.0–26.0)
CO2: 29 mEq/L (ref 22–29)
Calcium: 9.8 mg/dL (ref 8.4–10.4)
Chloride: 103 mEq/L (ref 98–109)
Creatinine: 1.3 mg/dL (ref 0.7–1.3)
EGFR: 69 mL/min/{1.73_m2} — ABNORMAL LOW (ref >90)
Glucose: 140 mg/dl (ref 70–140)
Potassium: 3.8 mEq/L (ref 3.5–5.1)
Sodium: 141 mEq/L (ref 136–145)
TOTAL PROTEIN: 6.9 g/dL (ref 6.4–8.3)

## 2016-07-24 LAB — CBC WITH DIFFERENTIAL/PLATELET
BASO%: 0 % (ref 0.0–2.0)
Basophils Absolute: 0 10*3/uL (ref 0.0–0.1)
EOS ABS: 0 10*3/uL (ref 0.0–0.5)
EOS%: 1.1 % (ref 0.0–7.0)
HCT: 37.5 % — ABNORMAL LOW (ref 38.4–49.9)
HEMOGLOBIN: 12.6 g/dL — AB (ref 13.0–17.1)
LYMPH%: 13 % — AB (ref 14.0–49.0)
MCH: 27.6 pg (ref 27.2–33.4)
MCHC: 33.6 g/dL (ref 32.0–36.0)
MCV: 82.1 fL (ref 79.3–98.0)
MONO#: 0.2 10*3/uL (ref 0.1–0.9)
MONO%: 8.9 % (ref 0.0–14.0)
NEUT%: 77 % — ABNORMAL HIGH (ref 39.0–75.0)
NEUTROS ABS: 2.1 10*3/uL (ref 1.5–6.5)
Platelets: 183 10*3/uL (ref 140–400)
RBC: 4.57 10*6/uL (ref 4.20–5.82)
RDW: 15.1 % — AB (ref 11.0–14.6)
WBC: 2.7 10*3/uL — AB (ref 4.0–10.3)
lymph#: 0.4 10*3/uL — ABNORMAL LOW (ref 0.9–3.3)

## 2016-07-24 LAB — TSH: TSH: 0.091 m[IU]/L — AB (ref 0.320–4.118)

## 2016-07-24 LAB — MAGNESIUM: Magnesium: 2.1 mg/dl (ref 1.5–2.5)

## 2016-07-24 NOTE — Progress Notes (Signed)
   Weekly Management Note:  Outpatient   ICD-10-CM   1. 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[facility]) C32.1     Current Dose:  36 Gy to H+N region; lung treatment SBRT s/p 3 fxs to 54 Gy Projected Dose: 70 Gy to H+N region   Narrative:  The patient presents for routine under treatment assessment.  CBCT/MVCT images/Port film x-rays were reviewed.  The chart was checked.  Thick saliva. Dysphagia to liquids.   Fatigued  Physical Findings:  Wt Readings from Last 3 Encounters:  07/24/16 147 lb 12.8 oz (67 kg)  07/20/16 149 lb (67.6 kg)  07/17/16 150 lb 12.8 oz (68.4 kg)    weight is 147 lb 12.8 oz (67 kg). His temperature is 99.1 F (37.3 C). His blood pressure is 117/87 and his pulse is 94. His oxygen saturation is 99%.   +erythema in the throat + patchy mucositis. Skin over neck is intact - still with palpable adenopathy in neck.  CBC    Component Value Date/Time   WBC 2.7 (L) 07/24/2016 1404   WBC 6.7 06/23/2016 0954   RBC 4.57 07/24/2016 1404   RBC 4.93 06/23/2016 0954   HGB 12.6 (L) 07/24/2016 1404   HCT 37.5 (L) 07/24/2016 1404   PLT 183 07/24/2016 1404   MCV 82.1 07/24/2016 1404   MCH 27.6 07/24/2016 1404   MCH 28.4 06/23/2016 0954   MCHC 33.6 07/24/2016 1404   MCHC 34.0 06/23/2016 0954   RDW 15.1 (H) 07/24/2016 1404   LYMPHSABS 0.4 (L) 07/24/2016 1404   MONOABS 0.2 07/24/2016 1404   EOSABS 0.0 07/24/2016 1404   BASOSABS 0.0 07/24/2016 1404   CMP     Component Value Date/Time   NA 141 07/24/2016 1404   K 3.8 07/24/2016 1404   CL 97 (L) 06/23/2016 0954   CO2 29 07/24/2016 1404   GLUCOSE 140 07/24/2016 1404   BUN 22.9 07/24/2016 1404   CREATININE 1.3 07/24/2016 1404   CALCIUM 9.8 07/24/2016 1404   PROT 6.9 07/24/2016 1404   ALBUMIN 3.6 07/24/2016 1404   AST 19 07/24/2016 1404   ALT 21 07/24/2016 1404   ALKPHOS 65 07/24/2016 1404   BILITOT 0.73 07/24/2016 1404   GFRNONAA >60 06/23/2016 0954   GFRAA >60 06/23/2016 0954     Impression:  The patient is tolerating  radiotherapy.   Plan:  Continue radiotherapy as planned. Patient may continue to use Lidocaine and morphine as directed for pain. Use Rxs as recommended.  Push intake via PEG to increase weight. Patient desires to continue baking soda gargles, declines scopolamine.  He will also follow / w/ nutritionist and SLP as scheduled for symptoms/ QOL.  -----------------------------------  [name], MD  This document serves as a record of services personally performed by [name], MD. It was created on her behalf by Maryla Morrow, a trained medical scribe. The creation of this record is based on the scribe's personal observations and the provider's statements to them. This document has been checked and approved by the attending provider.

## 2016-07-24 NOTE — Progress Notes (Signed)
Antonio [name] presents for his 18th fraction of radiation to his Larynx. He denies pain. He reports thick saliva and gagging. He is using baking soda rinses frequently. He is unable to take much in orally. He is taking 6 cans of osmolite daily through his feeding tube. He is instilling water also through his tube. The skin to his neck is intact, and he is using biafine twice daily. He received chemotherapy on Friday, and reports extreme fatigue since that time.   BP 117/87   Pulse 94   Temp 99.1 F (37.3 C)   Wt 147 lb 12.8 oz (67 kg)   SpO2 99% Comment: room air  BMI 26.18 kg/m    Wt Readings from Last 3 Encounters:  07/24/16 147 lb 12.8 oz (67 kg)  07/20/16 149 lb (67.6 kg)  07/17/16 150 lb 12.8 oz (68.4 kg)

## 2016-07-25 ENCOUNTER — Ambulatory Visit
Admission: RE | Admit: 2016-07-25 | Discharge: 2016-07-25 | Disposition: A | Discharge status: Home / Self Care | Payer: Medicare Other | Source: Ambulatory Visit | Attending: Radiation Oncology | Admitting: Radiation Oncology

## 2016-07-25 DIAGNOSIS — K5900 Constipation, unspecified: Secondary | ICD-10-CM | POA: Diagnosis not present

## 2016-07-25 DIAGNOSIS — I10 Essential (primary) hypertension: Secondary | ICD-10-CM | POA: Diagnosis not present

## 2016-07-25 DIAGNOSIS — K219 Gastro-esophageal reflux disease without esophagitis: Secondary | ICD-10-CM | POA: Diagnosis not present

## 2016-07-25 DIAGNOSIS — Z510 Encounter for antineoplastic radiation therapy: Secondary | ICD-10-CM | POA: Diagnosis not present

## 2016-07-25 DIAGNOSIS — C3431 Malignant neoplasm of lower lobe, right bronchus or lung: Secondary | ICD-10-CM | POA: Diagnosis not present

## 2016-07-25 DIAGNOSIS — C321 Malignant neoplasm of supraglottis: Secondary | ICD-10-CM | POA: Diagnosis not present

## 2016-07-25 DIAGNOSIS — E7800 Pure hypercholesterolemia, unspecified: Secondary | ICD-10-CM | POA: Diagnosis not present

## 2016-07-26 ENCOUNTER — Ambulatory Visit
Admission: RE | Admit: 2016-07-26 | Discharge: 2016-07-26 | Disposition: A | Discharge status: Home / Self Care | Payer: Medicare Other | Source: Ambulatory Visit | Attending: Radiation Oncology | Admitting: Radiation Oncology

## 2016-07-26 DIAGNOSIS — I10 Essential (primary) hypertension: Secondary | ICD-10-CM | POA: Diagnosis not present

## 2016-07-26 DIAGNOSIS — C321 Malignant neoplasm of supraglottis: Secondary | ICD-10-CM | POA: Diagnosis not present

## 2016-07-26 DIAGNOSIS — E7800 Pure hypercholesterolemia, unspecified: Secondary | ICD-10-CM | POA: Diagnosis not present

## 2016-07-26 DIAGNOSIS — C770 Secondary and unspecified malignant neoplasm of lymph nodes of head, face and neck: Secondary | ICD-10-CM | POA: Diagnosis not present

## 2016-07-26 DIAGNOSIS — C3431 Malignant neoplasm of lower lobe, right bronchus or lung: Secondary | ICD-10-CM | POA: Diagnosis not present

## 2016-07-26 DIAGNOSIS — K219 Gastro-esophageal reflux disease without esophagitis: Secondary | ICD-10-CM | POA: Diagnosis not present

## 2016-07-26 DIAGNOSIS — R1314 Dysphagia, pharyngoesophageal phase: Secondary | ICD-10-CM | POA: Diagnosis not present

## 2016-07-26 DIAGNOSIS — Z510 Encounter for antineoplastic radiation therapy: Secondary | ICD-10-CM | POA: Diagnosis not present

## 2016-07-26 DIAGNOSIS — K5900 Constipation, unspecified: Secondary | ICD-10-CM | POA: Diagnosis not present

## 2016-07-27 ENCOUNTER — Ambulatory Visit: Payer: Medicare Other | Admitting: Nutrition

## 2016-07-27 ENCOUNTER — Ambulatory Visit
Admission: RE | Admit: 2016-07-27 | Discharge: 2016-07-27 | Disposition: A | Discharge status: Home / Self Care | Payer: Medicare Other | Source: Ambulatory Visit | Attending: Radiation Oncology | Admitting: Radiation Oncology

## 2016-07-27 ENCOUNTER — Ambulatory Visit (HOSPITAL_BASED_OUTPATIENT_CLINIC_OR_DEPARTMENT_OTHER): Payer: Medicare Other | Admitting: Hematology and Oncology

## 2016-07-27 ENCOUNTER — Encounter: Payer: Self-pay | Admitting: Hematology and Oncology

## 2016-07-27 DIAGNOSIS — D61810 Antineoplastic chemotherapy induced pancytopenia: Secondary | ICD-10-CM

## 2016-07-27 DIAGNOSIS — C321 Malignant neoplasm of supraglottis: Secondary | ICD-10-CM

## 2016-07-27 DIAGNOSIS — K5900 Constipation, unspecified: Secondary | ICD-10-CM | POA: Diagnosis not present

## 2016-07-27 DIAGNOSIS — C770 Secondary and unspecified malignant neoplasm of lymph nodes of head, face and neck: Secondary | ICD-10-CM

## 2016-07-27 DIAGNOSIS — E7800 Pure hypercholesterolemia, unspecified: Secondary | ICD-10-CM | POA: Diagnosis not present

## 2016-07-27 DIAGNOSIS — R110 Nausea: Secondary | ICD-10-CM | POA: Diagnosis not present

## 2016-07-27 DIAGNOSIS — D61818 Other pancytopenia: Secondary | ICD-10-CM | POA: Insufficient documentation

## 2016-07-27 DIAGNOSIS — C3431 Malignant neoplasm of lower lobe, right bronchus or lung: Secondary | ICD-10-CM | POA: Diagnosis not present

## 2016-07-27 DIAGNOSIS — I10 Essential (primary) hypertension: Secondary | ICD-10-CM | POA: Diagnosis not present

## 2016-07-27 DIAGNOSIS — G893 Neoplasm related pain (acute) (chronic): Secondary | ICD-10-CM

## 2016-07-27 DIAGNOSIS — Z510 Encounter for antineoplastic radiation therapy: Secondary | ICD-10-CM | POA: Diagnosis not present

## 2016-07-27 DIAGNOSIS — K219 Gastro-esophageal reflux disease without esophagitis: Secondary | ICD-10-CM | POA: Diagnosis not present

## 2016-07-27 MED ORDER — SCOPOLAMINE 1 MG/3DAYS TD PT72: 1.0000 | MEDICATED_PATCH | TRANSDERMAL | 12 refills | Status: DC

## 2016-07-27 NOTE — Assessment & Plan Note (Signed)
This is due to recent treatment.  He is not symptomatic.  Continue close observation

## 2016-07-27 NOTE — Assessment & Plan Note (Signed)
He has started to experience a lot of gagging secondary to mucus production I recommend a trial of scopolamine patch

## 2016-07-27 NOTE — Progress Notes (Signed)
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 LEFT neck lymphadenopathy including LEFT level IIa to level 3 4.7 x 4.6 x 7.6 cm necrotic nodal conglomeration with suspected extracapsular spread of disease. Moderately narrowed hypopharynx.       05/18/2016 Procedure    Technically successful ultrasound-guided core biopsy of left cervical adenopathy.       05/18/2016 Pathology Results    Lymph node, needle/core biopsy, L cerv LAN - SQUAMOUS CELL CARCINOMA, SEE COMMENT. Microscopic Comment Residual lymph node is not seen. There is tumor invading skeletal muscle. p16 weakly positive      06/06/2016 Procedure    He had dental extraction      06/08/2016 PET scan    1. 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 2. Hypermetabolic right lower lobe pulmonary nodule is favored to represent a synchronous primary bronchogenic neoplasm. Isolated pulmonary metastasis could look similar. No thoracic nodal hypermetabolism. 3. Soft tissue hypermetabolism about the right iliac bone and greater trochanter of the proximal right femur are favored to be degenerative or posttraumatic. Soft tissue metastasis felt highly unlikely. 4.  Coronary artery atherosclerosis.  Aortic atherosclerosis. 5. A tiny right lower lobe pulmonary nodule is most likely a subpleural lymph node.      06/15/2016 Procedure    He has near normal baseline hearing test      06/23/2016 Procedure    Ultrasound and fluoroscopically guided right internal jugular single lumen power port catheter insertion. Tip in the SVC/RA junction. Catheter ready for use      06/23/2016 Procedure    Fluoroscopic insertion of a 20-French "pull-through" gastrostomy.      06/28/2016 -  Radiation Therapy    He received radiation      06/30/2016 -  Chemotherapy    He received chemotherapy        INTERVAL HISTORY: Please see below for problem oriented charting. The patient is seen in his weekly supportive care visit He has started to have a lot of nausea and mucus gagging His pain is well controlled with morphine sulfate He denies constipation He has lost some weight since I saw him due to pain and inability to eat He has appointment to see nutritionist today He denies recent smoking or drinking  REVIEW OF SYSTEMS:   Constitutional: Denies fevers, chills  Eyes: Denies blurriness of vision Respiratory: Denies cough, dyspnea or wheezes Cardiovascular: Denies palpitation, chest discomfort or lower extremity swelling Skin: Denies abnormal skin rashes Lymphatics: Denies new lymphadenopathy or easy bruising Neurological:Denies numbness, tingling or new weaknesses Behavioral/Psych: Mood is stable, no new changes  All other systems were reviewed with the patient and are negative.  I have reviewed the past medical history, past surgical history, social history and family history with the patient and they are unchanged from previous note.  ALLERGIES:  has No Known Allergies.  MEDICATIONS:  Current Outpatient Prescriptions  Medication Sig Dispense Refill  . cyclobenzaprine (FLEXERIL) 10 MG tablet Take 1 tablet (10 mg total) by mouth at bedtime. 30 tablet 1  . emollient (BIAFINE) cream Apply  topically as needed.    [name] ALLERGY RELIEF 10 MG tablet TAKE ONE TABLET BY MOUTH ONCE DAILY 90 tablet 3  . gabapentin (NEURONTIN) 300 MG capsule Take 1 capsule (300 mg total) by mouth 3 (three) times daily. 90 capsule 3  . LORazepam (ATIVAN) 0.5 MG tablet Take one tablet 20-30 minutes before radiotherapy PRN anxiety/ claustrophobia. 35 tablet 0  . morphine (MSIR) 15 MG tablet Take 1 tablet (15 mg total) by mouth every 6 (six) hours as needed for severe pain. 60 tablet 0  . Nutritional Supplements (FEEDING SUPPLEMENT, OSMOLITE 1.5 CAL,) LIQD Begin Osmolite 1.5 via PEG TID with 60 mL free water. Advance as tolerated to goal of 1 1/2 bottles QID with 60 mL free water before and after. In addition, drink or flush PEG with 240 mL free water TID. 6 Bottle 11  . senna-docusate (SENOKOT-S) 8.6-50 MG tablet Take 1 tablet by mouth 2 (two) times daily. 60 tablet 3  . diclofenac sodium (VOLTAREN) 1 % GEL Apply 2 g topically 4 (four) times daily. (Patient not taking: Reported on 05/17/2016) 1 Tube 2  . lidocaine (XYLOCAINE) 2 % solution Patient: Mix 1part 2% viscous lidocaine, 1part H20. Swish & swallow 41mL of diluted mixture, 81min before meals and at bedtime, up to QID (Patient not taking: Reported on 07/24/2016) 100 mL 5  . lidocaine-prilocaine (EMLA) cream Apply to affected area once (Patient not taking: Reported on 06/28/2016) 30 g 3  . ondansetron (ZOFRAN) 8 MG tablet Take 1 tablet (8 mg total) by mouth 2 (two) times daily as needed. Start on the third day after chemotherapy. (Patient not taking: Reported on 06/28/2016) 30 tablet 1  . prochlorperazine (COMPAZINE) 10 MG tablet Take 1 tablet (10 mg total) by mouth every 6 (six) hours as needed (Nausea or vomiting). (Patient not taking: Reported on 06/28/2016) 30 tablet 1  . scopolamine (TRANSDERM-SCOP) 1 MG/3DAYS Place 1 patch (1.5 mg total) onto the skin every 3 (three) days. 10 patch 12   No current facility-administered medications for this visit.      PHYSICAL EXAMINATION: ECOG PERFORMANCE STATUS: 1 - Symptomatic but completely ambulatory  Vitals:   07/27/16 1413  BP: 122/76  Pulse: 93  Resp: 20  Temp: 99.1 F (37.3 C)   Filed Weights   07/27/16 1413  Weight: 145 lb 3.2 oz (65.9 kg)    GENERAL:alert, no distress and comfortable SKIN: skin color, texture, turgor are normal, no rashes or significant lesions EYES: normal, Conjunctiva are pink and non-injected, sclera clear OROPHARYNX: Noted mucositis change.  No thrush NECK: supple, thyroid normal size, non-tender, without nodularity LYMPH: He has neck lymphadenopathy, reduce in size LUNGS: clear to auscultation and percussion with normal breathing effort HEART: regular rate & rhythm and no murmurs and no lower extremity edema ABDOMEN:abdomen soft, non-tender and normal bowel sounds Musculoskeletal:no cyanosis of digits and no clubbing  NEURO: alert & oriented x 3 with fluent speech, no focal motor/sensory deficits  LABORATORY DATA:  I have reviewed the data as listed    Component Value Date/Time   NA 141 07/24/2016 1404   K 3.8 07/24/2016 1404   CL 97 (L) 06/23/2016 0954   CO2 29 07/24/2016 1404   GLUCOSE 140 07/24/2016 1404   BUN 22.9 07/24/2016 1404   CREATININE  1.3 07/24/2016 1404   CALCIUM 9.8 07/24/2016 1404   PROT 6.9 07/24/2016 1404   ALBUMIN 3.6 07/24/2016 1404   AST 19 07/24/2016 1404   ALT 21 07/24/2016 1404   ALKPHOS 65 07/24/2016 1404   BILITOT 0.73 07/24/2016 1404   GFRNONAA >60 06/23/2016 0954   GFRAA >60 06/23/2016 0954    No results found for: SPEP, UPEP  Lab Results  Component Value Date   WBC 2.7 (L) 07/24/2016   NEUTROABS 2.1 07/24/2016   HGB 12.6 (L) 07/24/2016   HCT 37.5 (L) 07/24/2016   MCV 82.1 07/24/2016   PLT 183 07/24/2016      Chemistry      Component Value Date/Time   NA 141 07/24/2016 1404   K 3.8 07/24/2016 1404   CL 97 (L) 06/23/2016 0954   CO2 29 07/24/2016 1404   BUN 22.9 07/24/2016 1404   CREATININE 1.3  07/24/2016 1404      Component Value Date/Time   CALCIUM 9.8 07/24/2016 1404   ALKPHOS 65 07/24/2016 1404   AST 19 07/24/2016 1404   ALT 21 07/24/2016 1404   BILITOT 0.73 07/24/2016 1404       ASSESSMENT & PLAN:  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  He is not symptomatic.  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  All questions were answered. The patient knows to call the clinic with any problems, questions or concerns. No barriers to learning was detected. I spent 15 minutes counseling the patient face to face. The total time spent in the appointment was 20 minutes and more than 50% was on counseling and review of test results     [name], MD 07/27/2016 3:47 PM

## 2016-07-27 NOTE — Progress Notes (Signed)
Nutrition follow-up completed with patient receiving radiation therapy for laryngeal cancer. Weight decreased and documented as 145.2 pounds on June 14 decreased from 149 pounds June 11. Patient reports he is using Osmolite 1.5 via PEG. He states he infuses between 4 and 6 cans daily. He also flushes tube with water but cannot specify amount. He is able to drink very small amounts of water and other liquids.  Nutrition diagnosis: Unintended weight loss continues.  Estimated nutrition needs: 2040-2240 calories, 82-105 grams protein, 2.2 L fluid.  Intervention: Educated patient to increase Osmolite 1.5-1-1/2 cans 4 times a day with 60 mL free water before and after bolus feeding. In addition patient should flush his feeding tube with 240 mL free water 3 times a day. Encouraged patient to continue to try liquids and soft foods by mouth. Questions were answered.  Teach back method used  6 cans Osmolite 1.5 provides 2130 cal, 89.4 g protein, 2286 mL free water.  Monitoring, evaluation, goals: Patient will tolerate tube feeding at goal rate to minimize weight loss and promote healing.  Next visit: Thursday, June 21.

## 2016-07-27 NOTE — Assessment & Plan Note (Signed)
He tolerated treatment well but has started to experience some expected side effects from treatment I will continue to see him on a weekly basis and he will continue chemotherapy as prescribed Clinically, he has positive response to treatment

## 2016-07-27 NOTE — Assessment & Plan Note (Signed)
The neck mass is smaller in size He has positive response to treatment Continue to monitor closely

## 2016-07-27 NOTE — Assessment & Plan Note (Signed)
This is much improved since chemotherapy He will continue to take morphine sulfate as needed

## 2016-07-28 ENCOUNTER — Ambulatory Visit
Admission: RE | Admit: 2016-07-28 | Discharge: 2016-07-28 | Disposition: A | Discharge status: Home / Self Care | Payer: Medicare Other | Source: Ambulatory Visit | Attending: Radiation Oncology | Admitting: Radiation Oncology

## 2016-07-28 DIAGNOSIS — E7800 Pure hypercholesterolemia, unspecified: Secondary | ICD-10-CM | POA: Diagnosis not present

## 2016-07-28 DIAGNOSIS — I10 Essential (primary) hypertension: Secondary | ICD-10-CM | POA: Diagnosis not present

## 2016-07-28 DIAGNOSIS — Z510 Encounter for antineoplastic radiation therapy: Secondary | ICD-10-CM | POA: Diagnosis not present

## 2016-07-28 DIAGNOSIS — C3431 Malignant neoplasm of lower lobe, right bronchus or lung: Secondary | ICD-10-CM | POA: Diagnosis not present

## 2016-07-28 DIAGNOSIS — C321 Malignant neoplasm of supraglottis: Secondary | ICD-10-CM | POA: Diagnosis not present

## 2016-07-28 DIAGNOSIS — K219 Gastro-esophageal reflux disease without esophagitis: Secondary | ICD-10-CM | POA: Diagnosis not present

## 2016-07-28 DIAGNOSIS — K5900 Constipation, unspecified: Secondary | ICD-10-CM | POA: Diagnosis not present

## 2016-07-31 ENCOUNTER — Other Ambulatory Visit (HOSPITAL_BASED_OUTPATIENT_CLINIC_OR_DEPARTMENT_OTHER): Payer: Medicare Other

## 2016-07-31 ENCOUNTER — Ambulatory Visit
Admission: RE | Admit: 2016-07-31 | Discharge: 2016-07-31 | Disposition: A | Discharge status: Home / Self Care | Payer: Medicare Other | Source: Ambulatory Visit | Attending: Radiation Oncology | Admitting: Radiation Oncology

## 2016-07-31 ENCOUNTER — Encounter: Payer: Self-pay | Admitting: Radiation Oncology

## 2016-07-31 ENCOUNTER — Ambulatory Visit: Payer: Medicare Other | Attending: Hematology and Oncology

## 2016-07-31 ENCOUNTER — Ambulatory Visit: Payer: Medicare Other

## 2016-07-31 VITALS — BP 127/88 | HR 80 | Temp 99.2°F | Wt 145.2 lb

## 2016-07-31 DIAGNOSIS — K219 Gastro-esophageal reflux disease without esophagitis: Secondary | ICD-10-CM | POA: Diagnosis not present

## 2016-07-31 DIAGNOSIS — E7800 Pure hypercholesterolemia, unspecified: Secondary | ICD-10-CM | POA: Diagnosis not present

## 2016-07-31 DIAGNOSIS — C3431 Malignant neoplasm of lower lobe, right bronchus or lung: Secondary | ICD-10-CM | POA: Diagnosis not present

## 2016-07-31 DIAGNOSIS — C770 Secondary and unspecified malignant neoplasm of lymph nodes of head, face and neck: Secondary | ICD-10-CM | POA: Diagnosis not present

## 2016-07-31 DIAGNOSIS — C321 Malignant neoplasm of supraglottis: Secondary | ICD-10-CM | POA: Diagnosis not present

## 2016-07-31 DIAGNOSIS — I10 Essential (primary) hypertension: Secondary | ICD-10-CM | POA: Diagnosis not present

## 2016-07-31 DIAGNOSIS — K5900 Constipation, unspecified: Secondary | ICD-10-CM | POA: Diagnosis not present

## 2016-07-31 DIAGNOSIS — Z510 Encounter for antineoplastic radiation therapy: Secondary | ICD-10-CM | POA: Diagnosis not present

## 2016-07-31 LAB — COMPREHENSIVE METABOLIC PANEL
ALT: 23 U/L (ref 0–55)
AST: 18 U/L (ref 5–34)
Albumin: 3.4 g/dL — ABNORMAL LOW (ref 3.5–5.0)
Alkaline Phosphatase: 63 U/L (ref 40–150)
Anion Gap: 9 mEq/L (ref 3–11)
BILIRUBIN TOTAL: 0.3 mg/dL (ref 0.20–1.20)
BUN: 15.6 mg/dL (ref 7.0–26.0)
CALCIUM: 9.6 mg/dL (ref 8.4–10.4)
CHLORIDE: 103 meq/L (ref 98–109)
CO2: 30 meq/L — AB (ref 22–29)
CREATININE: 0.9 mg/dL (ref 0.7–1.3)
EGFR: >90 mL/min/{1.73_m2} (ref >90)
Glucose: 119 mg/dl (ref 70–140)
Potassium: 3.8 mEq/L (ref 3.5–5.1)
Sodium: 142 mEq/L (ref 136–145)
TOTAL PROTEIN: 6.7 g/dL (ref 6.4–8.3)

## 2016-07-31 LAB — CBC WITH DIFFERENTIAL/PLATELET
BASO%: 0.2 % (ref 0.0–2.0)
BASOS ABS: 0 10*3/uL (ref 0.0–0.1)
EOS ABS: 0 10*3/uL (ref 0.0–0.5)
EOS%: 0.2 % (ref 0.0–7.0)
HCT: 35.3 % — ABNORMAL LOW (ref 38.4–49.9)
HGB: 11.6 g/dL — ABNORMAL LOW (ref 13.0–17.1)
LYMPH%: 9.1 % — ABNORMAL LOW (ref 14.0–49.0)
MCH: 27 pg — ABNORMAL LOW (ref 27.2–33.4)
MCHC: 32.9 g/dL (ref 32.0–36.0)
MCV: 82.1 fL (ref 79.3–98.0)
MONO#: 0.2 10*3/uL (ref 0.1–0.9)
MONO%: 3.5 % (ref 0.0–14.0)
NEUT#: 3.9 10*3/uL (ref 1.5–6.5)
NEUT%: 87 % — AB (ref 39.0–75.0)
Platelets: 103 10*3/uL — ABNORMAL LOW (ref 140–400)
RBC: 4.3 10*6/uL (ref 4.20–5.82)
RDW: 15.2 % — ABNORMAL HIGH (ref 11.0–14.6)
WBC: 4.5 10*3/uL (ref 4.0–10.3)
lymph#: 0.4 10*3/uL — ABNORMAL LOW (ref 0.9–3.3)

## 2016-07-31 LAB — MAGNESIUM: MAGNESIUM: 2.2 mg/dL (ref 1.5–2.5)

## 2016-07-31 NOTE — Progress Notes (Signed)
   Weekly Management Note:  Outpatient   ICD-10-CM   1. 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[facility]) C32.1     Current Dose:  46 Gy to H+N region; lung treatment SBRT s/p 3 fxs to 54 Gy Projected Dose: 70 Gy to H+N region   Narrative:  The patient presents for routine under treatment assessment.  CBCT/MVCT images/Port film x-rays were reviewed.  The chart was checked.  Thick saliva. Mild constipation. No pain. Overall, doing well. Fatigued  Physical Findings:  Wt Readings from Last 3 Encounters:  07/31/16 145 lb 3.2 oz (65.9 kg)  07/27/16 145 lb 3.2 oz (65.9 kg)  07/24/16 147 lb 12.8 oz (67 kg)    weight is 145 lb 3.2 oz (65.9 kg). His temperature is 99.2 F (37.3 C). His blood pressure is 127/88 and his pulse is 80. His oxygen saturation is 100%.   +erythema in the throat + patchy mucositis. Moist mucosa, no thrush Skin over neck is intact   CBC    Component Value Date/Time   WBC 4.5 07/31/2016 1407   WBC 6.7 06/23/2016 0954   RBC 4.30 07/31/2016 1407   RBC 4.93 06/23/2016 0954   HGB 11.6 (L) 07/31/2016 1407   HCT 35.3 (L) 07/31/2016 1407   PLT 103 (L) 07/31/2016 1407   MCV 82.1 07/31/2016 1407   MCH 27.0 (L) 07/31/2016 1407   MCH 28.4 06/23/2016 0954   MCHC 32.9 07/31/2016 1407   MCHC 34.0 06/23/2016 0954   RDW 15.2 (H) 07/31/2016 1407   LYMPHSABS 0.4 (L) 07/31/2016 1407   MONOABS 0.2 07/31/2016 1407   EOSABS 0.0 07/31/2016 1407   BASOSABS 0.0 07/31/2016 1407   CMP     Component Value Date/Time   NA 142 07/31/2016 1407   K 3.8 07/31/2016 1407   CL 97 (L) 06/23/2016 0954   CO2 30 (H) 07/31/2016 1407   GLUCOSE 119 07/31/2016 1407   BUN 15.6 07/31/2016 1407   CREATININE 0.9 07/31/2016 1407   CALCIUM 9.6 07/31/2016 1407   PROT 6.7 07/31/2016 1407   ALBUMIN 3.4 (L) 07/31/2016 1407   AST 18 07/31/2016 1407   ALT 23 07/31/2016 1407   ALKPHOS 63 07/31/2016 1407   BILITOT 0.30 07/31/2016 1407   GFRNONAA >60 06/23/2016 0954   GFRAA >60 06/23/2016 0954     Impression:  The  patient is tolerating radiotherapy.   Plan:  Continue radiotherapy as planned. Patient may continue to use Lidocaine and morphine as directed for pain. Use Rxs as recommended. Take Miralax BID if needed for constipation, in addition to senokot.  Diet Ginger PRN thick saliva. -----------------------------------  [name], MD

## 2016-07-31 NOTE — Progress Notes (Signed)
Antonio [name] presents for his 23rd fraction of radiation to his Larynx. He denies pain. He has difficulty swallowing, and is crushing his pills. He was able to have a popsicle this weekend. He is drinking soda mixed with water orally. He is instilling 6 cans of osmolite daily through his PEG tube with water flushes. His neck is hyperpigmented, and he is using biafine twice daily. He has not picked up his scopalamine patch due to financial difficulties. He reports some mild constipation. He is taking miralax and sennakot pills.   BP 127/88   Pulse 80   Temp 99.2 F (37.3 C)   Wt 145 lb 3.2 oz (65.9 kg)   SpO2 100% Comment: room air  BMI 25.72 kg/m    Wt Readings from Last 3 Encounters:  07/31/16 145 lb 3.2 oz (65.9 kg)  07/27/16 145 lb 3.2 oz (65.9 kg)  07/24/16 147 lb 12.8 oz (67 kg)

## 2016-08-01 ENCOUNTER — Ambulatory Visit
Admission: RE | Admit: 2016-08-01 | Discharge: 2016-08-01 | Disposition: A | Discharge status: Home / Self Care | Payer: Medicare Other | Source: Ambulatory Visit | Attending: Radiation Oncology | Admitting: Radiation Oncology

## 2016-08-01 DIAGNOSIS — E7800 Pure hypercholesterolemia, unspecified: Secondary | ICD-10-CM | POA: Diagnosis not present

## 2016-08-01 DIAGNOSIS — K5900 Constipation, unspecified: Secondary | ICD-10-CM | POA: Diagnosis not present

## 2016-08-01 DIAGNOSIS — C321 Malignant neoplasm of supraglottis: Secondary | ICD-10-CM | POA: Diagnosis not present

## 2016-08-01 DIAGNOSIS — Z510 Encounter for antineoplastic radiation therapy: Secondary | ICD-10-CM | POA: Diagnosis not present

## 2016-08-01 DIAGNOSIS — I10 Essential (primary) hypertension: Secondary | ICD-10-CM | POA: Diagnosis not present

## 2016-08-01 DIAGNOSIS — K219 Gastro-esophageal reflux disease without esophagitis: Secondary | ICD-10-CM | POA: Diagnosis not present

## 2016-08-01 DIAGNOSIS — C3431 Malignant neoplasm of lower lobe, right bronchus or lung: Secondary | ICD-10-CM | POA: Diagnosis not present

## 2016-08-02 ENCOUNTER — Ambulatory Visit
Admission: RE | Admit: 2016-08-02 | Discharge: 2016-08-02 | Disposition: A | Discharge status: Home / Self Care | Payer: Medicare Other | Source: Ambulatory Visit | Attending: Radiation Oncology | Admitting: Radiation Oncology

## 2016-08-02 DIAGNOSIS — C3431 Malignant neoplasm of lower lobe, right bronchus or lung: Secondary | ICD-10-CM | POA: Diagnosis not present

## 2016-08-02 DIAGNOSIS — Z510 Encounter for antineoplastic radiation therapy: Secondary | ICD-10-CM | POA: Diagnosis not present

## 2016-08-02 DIAGNOSIS — K219 Gastro-esophageal reflux disease without esophagitis: Secondary | ICD-10-CM | POA: Diagnosis not present

## 2016-08-02 DIAGNOSIS — E7800 Pure hypercholesterolemia, unspecified: Secondary | ICD-10-CM | POA: Diagnosis not present

## 2016-08-02 DIAGNOSIS — C321 Malignant neoplasm of supraglottis: Secondary | ICD-10-CM | POA: Diagnosis not present

## 2016-08-02 DIAGNOSIS — I10 Essential (primary) hypertension: Secondary | ICD-10-CM | POA: Diagnosis not present

## 2016-08-02 DIAGNOSIS — K5900 Constipation, unspecified: Secondary | ICD-10-CM | POA: Diagnosis not present

## 2016-08-03 ENCOUNTER — Ambulatory Visit: Payer: Medicare Other | Admitting: Nutrition

## 2016-08-03 ENCOUNTER — Encounter: Payer: Self-pay | Admitting: Hematology and Oncology

## 2016-08-03 ENCOUNTER — Ambulatory Visit (HOSPITAL_BASED_OUTPATIENT_CLINIC_OR_DEPARTMENT_OTHER): Payer: Medicare Other | Admitting: Hematology and Oncology

## 2016-08-03 ENCOUNTER — Ambulatory Visit
Admission: RE | Admit: 2016-08-03 | Discharge: 2016-08-03 | Disposition: A | Discharge status: Home / Self Care | Payer: Medicare Other | Source: Ambulatory Visit | Attending: Radiation Oncology | Admitting: Radiation Oncology

## 2016-08-03 DIAGNOSIS — C3431 Malignant neoplasm of lower lobe, right bronchus or lung: Secondary | ICD-10-CM | POA: Diagnosis not present

## 2016-08-03 DIAGNOSIS — K5900 Constipation, unspecified: Secondary | ICD-10-CM | POA: Diagnosis not present

## 2016-08-03 DIAGNOSIS — D61818 Other pancytopenia: Secondary | ICD-10-CM

## 2016-08-03 DIAGNOSIS — C770 Secondary and unspecified malignant neoplasm of lymph nodes of head, face and neck: Secondary | ICD-10-CM | POA: Diagnosis not present

## 2016-08-03 DIAGNOSIS — C321 Malignant neoplasm of supraglottis: Secondary | ICD-10-CM | POA: Diagnosis not present

## 2016-08-03 DIAGNOSIS — Z510 Encounter for antineoplastic radiation therapy: Secondary | ICD-10-CM | POA: Diagnosis not present

## 2016-08-03 DIAGNOSIS — E7800 Pure hypercholesterolemia, unspecified: Secondary | ICD-10-CM | POA: Diagnosis not present

## 2016-08-03 DIAGNOSIS — K219 Gastro-esophageal reflux disease without esophagitis: Secondary | ICD-10-CM | POA: Diagnosis not present

## 2016-08-03 DIAGNOSIS — G893 Neoplasm related pain (acute) (chronic): Secondary | ICD-10-CM | POA: Diagnosis not present

## 2016-08-03 DIAGNOSIS — I10 Essential (primary) hypertension: Secondary | ICD-10-CM | POA: Diagnosis not present

## 2016-08-03 NOTE — Assessment & Plan Note (Signed)
He tolerated treatment well but has started to experience some expected side effects from treatment I will continue to see him on a weekly basis and he will continue chemotherapy as prescribed Clinically, he has positive response to treatment

## 2016-08-03 NOTE — Progress Notes (Signed)
Brief follow-up completed with patient receiving radiation therapy for laryngeal cancer. Weight improved documented as 147.6 pounds on June 21 increased from 145.2 pounds June 14. Patient continues to use Osmolite 1.5 via PEG. States he continues to flush between 4 and 6 cans daily. He is drinking water and flushing water through his tube. Reports when he has nausea it is controlled with medication. He denies diarrhea or constipation.  Nutrition diagnosis: Unintended weight loss has improved.  Intervention: Patient was educated to continue Osmolite 1.5, 1-1/2 cans, 4 times a day with 60 mL of free water before and after bolus feeding. In addition patient should flush his feeding tube with 240 mL free water 3 times a day. Encouraged patient to continue liquids and soft foods by mouth. Questions were answered.  Teach back method used.  Monitoring, evaluation, goals: Patient will continue to tolerate tube feedings plus oral intake to meet greater than 90% of estimated needs.  Next visit: Thursday, June 28  **Disclaimer: This note was dictated with voice recognition software. Similar sounding words can inadvertently be transcribed and this note may contain transcription errors which may not have been corrected upon publication of note.**

## 2016-08-03 NOTE — Assessment & Plan Note (Signed)
This is much improved since chemotherapy He will continue to take morphine sulfate as needed

## 2016-08-03 NOTE — Progress Notes (Signed)
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 LEFT neck lymphadenopathy including LEFT level IIa to level 3 4.7 x 4.6 x 7.6 cm necrotic nodal conglomeration with suspected extracapsular spread of disease. Moderately narrowed hypopharynx.       05/18/2016 Procedure    Technically successful ultrasound-guided core biopsy of left cervical adenopathy.       05/18/2016 Pathology Results    Lymph node, needle/core biopsy, L cerv LAN - SQUAMOUS CELL CARCINOMA, SEE COMMENT. Microscopic Comment Residual lymph node is not seen. There is tumor invading skeletal muscle. p16 weakly positive      06/06/2016 Procedure    He had dental extraction      06/08/2016 PET scan    1. 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 2. Hypermetabolic right lower lobe pulmonary nodule is favored to represent a synchronous primary bronchogenic neoplasm. Isolated pulmonary metastasis could look similar. No thoracic nodal hypermetabolism. 3. Soft tissue hypermetabolism about the right iliac bone and greater trochanter of the proximal right femur are favored to be degenerative or posttraumatic. Soft tissue metastasis felt highly unlikely. 4.  Coronary artery atherosclerosis.  Aortic atherosclerosis. 5. A tiny right lower lobe pulmonary nodule is most likely a subpleural lymph node.      06/15/2016 Procedure    He has near normal baseline hearing test      06/23/2016 Procedure    Ultrasound and fluoroscopically guided right internal jugular single lumen power port catheter insertion. Tip in the SVC/RA junction. Catheter ready for use      06/23/2016 Procedure    Fluoroscopic insertion of a 20-French "pull-through" gastrostomy.      06/28/2016 -  Radiation Therapy    He received radiation      06/30/2016 -  Chemotherapy    He received chemotherapy        INTERVAL HISTORY: Please see below for problem oriented charting. He is seen as his weekly supportive care visit He denies recent smoking or drinking He is able to eat and drink by mouth His pain is well controlled He has some mild nausea but no vomiting Denies constipation The size of the lymph node is getting smaller  REVIEW OF SYSTEMS:   Constitutional: Denies fevers, chills or abnormal weight loss Eyes: Denies blurriness of vision Respiratory: Denies cough, dyspnea or wheezes Cardiovascular: Denies palpitation, chest discomfort or lower extremity swelling Skin: Denies abnormal skin rashes Lymphatics: Denies new lymphadenopathy or easy bruising Neurological:Denies numbness, tingling or new weaknesses Behavioral/Psych: Mood is stable, no new changes  All other systems were reviewed with the patient and are negative.  I have reviewed the past medical history, past surgical history, social history and family history with the patient and they are unchanged from previous note.  ALLERGIES:  has No Known Allergies.  MEDICATIONS:  Current Outpatient Prescriptions  Medication Sig Dispense Refill  .  cyclobenzaprine (FLEXERIL) 10 MG tablet Take 1 tablet (10 mg total) by mouth at bedtime. 30 tablet 1  . diclofenac sodium (VOLTAREN) 1 % GEL Apply 2 g topically 4 (four) times daily. 1 Tube 2  .  emollient (BIAFINE) cream Apply topically as needed.    [name] ALLERGY RELIEF 10 MG tablet TAKE ONE TABLET BY MOUTH ONCE DAILY 90 tablet 3  . gabapentin (NEURONTIN) 300 MG capsule Take 1 capsule (300 mg total) by mouth 3 (three) times daily. 90 capsule 3  . lidocaine-prilocaine (EMLA) cream Apply to affected area once 30 g 3  . LORazepam (ATIVAN) 0.5 MG tablet Take one tablet 20-30 minutes before radiotherapy PRN anxiety/ claustrophobia. 35 tablet 0  . morphine (MSIR) 15 MG tablet Take 1 tablet (15 mg total) by mouth every 6 (six) hours as needed for severe pain. 60 tablet 0  . Nutritional Supplements (FEEDING SUPPLEMENT, OSMOLITE 1.5 CAL,) LIQD Begin Osmolite 1.5 via PEG TID with 60 mL free water. Advance as tolerated to goal of 1 1/2 bottles QID with 60 mL free water before and after. In addition, drink or flush PEG with 240 mL free water TID. 6 Bottle 11  . ondansetron (ZOFRAN) 8 MG tablet Take 1 tablet (8 mg total) by mouth 2 (two) times daily as needed. Start on the third day after chemotherapy. 30 tablet 1  . prochlorperazine (COMPAZINE) 10 MG tablet Take 1 tablet (10 mg total) by mouth every 6 (six) hours as needed (Nausea or vomiting). 30 tablet 1  . senna-docusate (SENOKOT-S) 8.6-50 MG tablet Take 1 tablet by mouth 2 (two) times daily. 60 tablet 3  . lidocaine (XYLOCAINE) 2 % solution Patient: Mix 1part 2% viscous lidocaine, 1part H20. Swish & swallow 21mL of diluted mixture, 51min before meals and at bedtime, up to QID (Patient not taking: Reported on 07/24/2016) 100 mL 5  . scopolamine (TRANSDERM-SCOP) 1 MG/3DAYS Place 1 patch (1.5 mg total) onto the skin every 3 (three) days. (Patient not taking: Reported on 07/31/2016) 10 patch 12   No current facility-administered medications for this visit.     PHYSICAL EXAMINATION: ECOG PERFORMANCE STATUS: 1 - Symptomatic but completely ambulatory  Vitals:   08/03/16 1433  BP: 114/69  Pulse: 87  Resp: 18  Temp: 99 F (37.2 C)   Filed Weights    08/03/16 1433  Weight: 147 lb 9.6 oz (67 kg)    GENERAL:alert, no distress and comfortable SKIN: skin color, texture, turgor are normal, no rashes or significant lesions EYES: normal, Conjunctiva are pink and non-injected, sclera clear OROPHARYNX: Noted mild mucositis.  No thrush NECK: supple, thyroid normal size, non-tender, without nodularity LYMPH: Previously palpable lymphadenopathy is smaller LUNGS: clear to auscultation and percussion with normal breathing effort HEART: regular rate & rhythm and no murmurs and no lower extremity edema ABDOMEN:abdomen soft, non-tender and normal bowel sounds Musculoskeletal:no cyanosis of digits and no clubbing  NEURO: alert & oriented x 3 with fluent speech, no focal motor/sensory deficits  LABORATORY DATA:  I have reviewed the data as listed    Component Value Date/Time   NA 142 07/31/2016 1407   K 3.8 07/31/2016 1407   CL 97 (L) 06/23/2016 0954   CO2 30 (H) 07/31/2016 1407   GLUCOSE 119 07/31/2016 1407   BUN 15.6 07/31/2016 1407   CREATININE 0.9 07/31/2016 1407   CALCIUM 9.6 07/31/2016 1407   PROT 6.7 07/31/2016 1407   ALBUMIN 3.4 (L) 07/31/2016 1407   AST 18 07/31/2016 1407   ALT  23 07/31/2016 1407   ALKPHOS 63 07/31/2016 1407   BILITOT 0.30 07/31/2016 1407   GFRNONAA >60 06/23/2016 0954   GFRAA >60 06/23/2016 0954    No results found for: SPEP, UPEP  Lab Results  Component Value Date   WBC 4.5 07/31/2016   NEUTROABS 3.9 07/31/2016   HGB 11.6 (L) 07/31/2016   HCT 35.3 (L) 07/31/2016   MCV 82.1 07/31/2016   PLT 103 (L) 07/31/2016      Chemistry      Component Value Date/Time   NA 142 07/31/2016 1407   K 3.8 07/31/2016 1407   CL 97 (L) 06/23/2016 0954   CO2 30 (H) 07/31/2016 1407   BUN 15.6 07/31/2016 1407   CREATININE 0.9 07/31/2016 1407      Component Value Date/Time   CALCIUM 9.6 07/31/2016 1407   ALKPHOS 63 07/31/2016 1407   AST 18 07/31/2016 1407   ALT 23 07/31/2016 1407   BILITOT 0.30 07/31/2016 1407       ASSESSMENT & PLAN:  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  He is not symptomatic.  Continue close observation   No orders of the defined types were placed in this encounter.  All questions were answered. The patient knows to call the clinic with any problems, questions or concerns. No barriers to learning was detected. I spent 15 minutes counseling the patient face to face. The total time spent in the appointment was 20 minutes and more than 50% was on counseling and review of test results     [name], MD 08/03/2016 4:30 PM

## 2016-08-03 NOTE — Assessment & Plan Note (Signed)
This is due to recent treatment.  He is not symptomatic.  Continue close observation

## 2016-08-03 NOTE — Assessment & Plan Note (Signed)
The neck mass is smaller in size He has positive response to treatment Continue to monitor closely

## 2016-08-04 ENCOUNTER — Ambulatory Visit
Admission: RE | Admit: 2016-08-04 | Discharge: 2016-08-04 | Disposition: A | Discharge status: Home / Self Care | Payer: Medicare Other | Source: Ambulatory Visit | Attending: Radiation Oncology | Admitting: Radiation Oncology

## 2016-08-04 DIAGNOSIS — C321 Malignant neoplasm of supraglottis: Secondary | ICD-10-CM | POA: Diagnosis not present

## 2016-08-04 DIAGNOSIS — E7800 Pure hypercholesterolemia, unspecified: Secondary | ICD-10-CM | POA: Diagnosis not present

## 2016-08-04 DIAGNOSIS — K219 Gastro-esophageal reflux disease without esophagitis: Secondary | ICD-10-CM | POA: Diagnosis not present

## 2016-08-04 DIAGNOSIS — K5900 Constipation, unspecified: Secondary | ICD-10-CM | POA: Diagnosis not present

## 2016-08-04 DIAGNOSIS — I10 Essential (primary) hypertension: Secondary | ICD-10-CM | POA: Diagnosis not present

## 2016-08-04 DIAGNOSIS — C3431 Malignant neoplasm of lower lobe, right bronchus or lung: Secondary | ICD-10-CM | POA: Diagnosis not present

## 2016-08-04 DIAGNOSIS — Z510 Encounter for antineoplastic radiation therapy: Secondary | ICD-10-CM | POA: Diagnosis not present

## 2016-08-07 ENCOUNTER — Other Ambulatory Visit: Payer: Self-pay | Admitting: Radiation Oncology

## 2016-08-07 ENCOUNTER — Ambulatory Visit
Admission: RE | Admit: 2016-08-07 | Discharge: 2016-08-07 | Disposition: A | Discharge status: Home / Self Care | Payer: Medicare Other | Source: Ambulatory Visit | Attending: Radiation Oncology | Admitting: Radiation Oncology

## 2016-08-07 ENCOUNTER — Other Ambulatory Visit (HOSPITAL_BASED_OUTPATIENT_CLINIC_OR_DEPARTMENT_OTHER): Payer: Medicare Other

## 2016-08-07 DIAGNOSIS — C3431 Malignant neoplasm of lower lobe, right bronchus or lung: Secondary | ICD-10-CM | POA: Diagnosis not present

## 2016-08-07 DIAGNOSIS — C770 Secondary and unspecified malignant neoplasm of lymph nodes of head, face and neck: Secondary | ICD-10-CM

## 2016-08-07 DIAGNOSIS — Z510 Encounter for antineoplastic radiation therapy: Secondary | ICD-10-CM | POA: Diagnosis not present

## 2016-08-07 DIAGNOSIS — I10 Essential (primary) hypertension: Secondary | ICD-10-CM | POA: Diagnosis not present

## 2016-08-07 DIAGNOSIS — C321 Malignant neoplasm of supraglottis: Secondary | ICD-10-CM

## 2016-08-07 DIAGNOSIS — E7800 Pure hypercholesterolemia, unspecified: Secondary | ICD-10-CM | POA: Diagnosis not present

## 2016-08-07 DIAGNOSIS — K219 Gastro-esophageal reflux disease without esophagitis: Secondary | ICD-10-CM | POA: Diagnosis not present

## 2016-08-07 DIAGNOSIS — K5900 Constipation, unspecified: Secondary | ICD-10-CM | POA: Diagnosis not present

## 2016-08-07 LAB — COMPREHENSIVE METABOLIC PANEL
ALBUMIN: 3.3 g/dL — AB (ref 3.5–5.0)
ALK PHOS: 59 U/L (ref 40–150)
ALT: 12 U/L (ref 0–55)
AST: 13 U/L (ref 5–34)
Anion Gap: 7 mEq/L (ref 3–11)
BUN: 11.9 mg/dL (ref 7.0–26.0)
CO2: 33 meq/L — AB (ref 22–29)
Calcium: 9.5 mg/dL (ref 8.4–10.4)
Chloride: 100 mEq/L (ref 98–109)
Creatinine: 0.9 mg/dL (ref 0.7–1.3)
EGFR: >90 mL/min/{1.73_m2} (ref >90)
Glucose: 103 mg/dl (ref 70–140)
POTASSIUM: 3.7 meq/L (ref 3.5–5.1)
SODIUM: 140 meq/L (ref 136–145)
TOTAL PROTEIN: 6.4 g/dL (ref 6.4–8.3)
Total Bilirubin: 0.47 mg/dL (ref 0.20–1.20)

## 2016-08-07 LAB — CBC WITH DIFFERENTIAL/PLATELET
BASO%: 0.6 % (ref 0.0–2.0)
BASOS ABS: 0 10*3/uL (ref 0.0–0.1)
EOS%: 2.5 % (ref 0.0–7.0)
Eosinophils Absolute: 0 10*3/uL (ref 0.0–0.5)
HEMATOCRIT: 31.7 % — AB (ref 38.4–49.9)
HGB: 10.7 g/dL — ABNORMAL LOW (ref 13.0–17.1)
LYMPH%: 10.7 % — AB (ref 14.0–49.0)
MCH: 27.7 pg (ref 27.2–33.4)
MCHC: 33.8 g/dL (ref 32.0–36.0)
MCV: 82.1 fL (ref 79.3–98.0)
MONO#: 0.4 10*3/uL (ref 0.1–0.9)
MONO%: 22.6 % — ABNORMAL HIGH (ref 0.0–14.0)
NEUT#: 1 10*3/uL — ABNORMAL LOW (ref 1.5–6.5)
NEUT%: 63.6 % (ref 39.0–75.0)
PLATELETS: 150 10*3/uL (ref 140–400)
RBC: 3.86 10*6/uL — ABNORMAL LOW (ref 4.20–5.82)
RDW: 15.9 % — ABNORMAL HIGH (ref 11.0–14.6)
WBC: 1.6 10*3/uL — ABNORMAL LOW (ref 4.0–10.3)
lymph#: 0.2 10*3/uL — ABNORMAL LOW (ref 0.9–3.3)
nRBC: 0 % (ref 0–0)

## 2016-08-07 LAB — MAGNESIUM: Magnesium: 2.2 mg/dl (ref 1.5–2.5)

## 2016-08-07 MED ORDER — LORAZEPAM 0.5 MG PO TABS: ORAL_TABLET | ORAL | 0 refills | Status: DC

## 2016-08-08 ENCOUNTER — Ambulatory Visit
Admission: RE | Admit: 2016-08-08 | Discharge: 2016-08-08 | Disposition: A | Discharge status: Home / Self Care | Payer: Medicare Other | Source: Ambulatory Visit | Attending: Radiation Oncology | Admitting: Radiation Oncology

## 2016-08-08 DIAGNOSIS — K219 Gastro-esophageal reflux disease without esophagitis: Secondary | ICD-10-CM | POA: Diagnosis not present

## 2016-08-08 DIAGNOSIS — K5900 Constipation, unspecified: Secondary | ICD-10-CM | POA: Diagnosis not present

## 2016-08-08 DIAGNOSIS — E7800 Pure hypercholesterolemia, unspecified: Secondary | ICD-10-CM | POA: Diagnosis not present

## 2016-08-08 DIAGNOSIS — Z510 Encounter for antineoplastic radiation therapy: Secondary | ICD-10-CM | POA: Diagnosis not present

## 2016-08-08 DIAGNOSIS — C3431 Malignant neoplasm of lower lobe, right bronchus or lung: Secondary | ICD-10-CM | POA: Diagnosis not present

## 2016-08-08 DIAGNOSIS — C321 Malignant neoplasm of supraglottis: Secondary | ICD-10-CM | POA: Diagnosis not present

## 2016-08-08 DIAGNOSIS — I10 Essential (primary) hypertension: Secondary | ICD-10-CM | POA: Diagnosis not present

## 2016-08-09 ENCOUNTER — Ambulatory Visit
Admission: RE | Admit: 2016-08-09 | Discharge: 2016-08-09 | Disposition: A | Discharge status: Home / Self Care | Payer: Medicare Other | Source: Ambulatory Visit | Attending: Radiation Oncology | Admitting: Radiation Oncology

## 2016-08-09 DIAGNOSIS — E7800 Pure hypercholesterolemia, unspecified: Secondary | ICD-10-CM | POA: Diagnosis not present

## 2016-08-09 DIAGNOSIS — C321 Malignant neoplasm of supraglottis: Secondary | ICD-10-CM | POA: Diagnosis not present

## 2016-08-09 DIAGNOSIS — K5900 Constipation, unspecified: Secondary | ICD-10-CM | POA: Diagnosis not present

## 2016-08-09 DIAGNOSIS — K219 Gastro-esophageal reflux disease without esophagitis: Secondary | ICD-10-CM | POA: Diagnosis not present

## 2016-08-09 DIAGNOSIS — Z510 Encounter for antineoplastic radiation therapy: Secondary | ICD-10-CM | POA: Diagnosis not present

## 2016-08-09 DIAGNOSIS — C3431 Malignant neoplasm of lower lobe, right bronchus or lung: Secondary | ICD-10-CM | POA: Diagnosis not present

## 2016-08-09 DIAGNOSIS — I10 Essential (primary) hypertension: Secondary | ICD-10-CM | POA: Diagnosis not present

## 2016-08-10 ENCOUNTER — Telehealth: Payer: Self-pay | Admitting: Hematology and Oncology

## 2016-08-10 ENCOUNTER — Ambulatory Visit (HOSPITAL_BASED_OUTPATIENT_CLINIC_OR_DEPARTMENT_OTHER): Payer: Medicare Other | Admitting: Hematology and Oncology

## 2016-08-10 ENCOUNTER — Encounter: Payer: Medicare Other | Admitting: Nutrition

## 2016-08-10 ENCOUNTER — Ambulatory Visit
Admission: RE | Admit: 2016-08-10 | Discharge: 2016-08-10 | Disposition: A | Discharge status: Home / Self Care | Payer: Medicare Other | Source: Ambulatory Visit | Attending: Radiation Oncology | Admitting: Radiation Oncology

## 2016-08-10 ENCOUNTER — Encounter: Payer: Self-pay | Admitting: *Deleted

## 2016-08-10 ENCOUNTER — Ambulatory Visit (HOSPITAL_BASED_OUTPATIENT_CLINIC_OR_DEPARTMENT_OTHER): Payer: Medicare Other

## 2016-08-10 DIAGNOSIS — C321 Malignant neoplasm of supraglottis: Secondary | ICD-10-CM | POA: Diagnosis not present

## 2016-08-10 DIAGNOSIS — D61818 Other pancytopenia: Secondary | ICD-10-CM | POA: Diagnosis not present

## 2016-08-10 DIAGNOSIS — C770 Secondary and unspecified malignant neoplasm of lymph nodes of head, face and neck: Secondary | ICD-10-CM

## 2016-08-10 DIAGNOSIS — Z510 Encounter for antineoplastic radiation therapy: Secondary | ICD-10-CM | POA: Diagnosis not present

## 2016-08-10 DIAGNOSIS — I10 Essential (primary) hypertension: Secondary | ICD-10-CM | POA: Diagnosis not present

## 2016-08-10 DIAGNOSIS — K5900 Constipation, unspecified: Secondary | ICD-10-CM | POA: Diagnosis not present

## 2016-08-10 DIAGNOSIS — C3431 Malignant neoplasm of lower lobe, right bronchus or lung: Secondary | ICD-10-CM | POA: Diagnosis not present

## 2016-08-10 DIAGNOSIS — K219 Gastro-esophageal reflux disease without esophagitis: Secondary | ICD-10-CM | POA: Diagnosis not present

## 2016-08-10 DIAGNOSIS — G893 Neoplasm related pain (acute) (chronic): Secondary | ICD-10-CM

## 2016-08-10 DIAGNOSIS — E7800 Pure hypercholesterolemia, unspecified: Secondary | ICD-10-CM | POA: Diagnosis not present

## 2016-08-10 LAB — CBC WITH DIFFERENTIAL/PLATELET
BASO%: 0.3 % (ref 0.0–2.0)
BASOS ABS: 0 10*3/uL (ref 0.0–0.1)
EOS%: 3.9 % (ref 0.0–7.0)
Eosinophils Absolute: 0.1 10*3/uL (ref 0.0–0.5)
HCT: 31.8 % — ABNORMAL LOW (ref 38.4–49.9)
HGB: 10.7 g/dL — ABNORMAL LOW (ref 13.0–17.1)
LYMPH%: 4.1 % — AB (ref 14.0–49.0)
MCH: 27.6 pg (ref 27.2–33.4)
MCHC: 33.6 g/dL (ref 32.0–36.0)
MCV: 82.1 fL (ref 79.3–98.0)
MONO#: 0.4 10*3/uL (ref 0.1–0.9)
MONO%: 17.5 % — AB (ref 0.0–14.0)
NEUT#: 1.7 10*3/uL (ref 1.5–6.5)
NEUT%: 74.2 % (ref 39.0–75.0)
Platelets: 213 10*3/uL (ref 140–400)
RBC: 3.87 10*6/uL — AB (ref 4.20–5.82)
RDW: 16.4 % — ABNORMAL HIGH (ref 11.0–14.6)
WBC: 2.2 10*3/uL — ABNORMAL LOW (ref 4.0–10.3)
lymph#: 0.1 10*3/uL — ABNORMAL LOW (ref 0.9–3.3)

## 2016-08-10 LAB — COMPREHENSIVE METABOLIC PANEL
ALBUMIN: 3.2 g/dL — AB (ref 3.5–5.0)
ALK PHOS: 58 U/L (ref 40–150)
ALT: 11 U/L (ref 0–55)
AST: 12 U/L (ref 5–34)
Anion Gap: 7 mEq/L (ref 3–11)
BILIRUBIN TOTAL: 0.42 mg/dL (ref 0.20–1.20)
BUN: 10.6 mg/dL (ref 7.0–26.0)
CO2: 31 meq/L — AB (ref 22–29)
CREATININE: 0.8 mg/dL (ref 0.7–1.3)
Calcium: 9.6 mg/dL (ref 8.4–10.4)
Chloride: 101 mEq/L (ref 98–109)
EGFR: >90 mL/min/{1.73_m2} (ref >90)
GLUCOSE: 148 mg/dL — AB (ref 70–140)
Potassium: 3.6 mEq/L (ref 3.5–5.1)
SODIUM: 139 meq/L (ref 136–145)
TOTAL PROTEIN: 6.5 g/dL (ref 6.4–8.3)

## 2016-08-10 LAB — MAGNESIUM: MAGNESIUM: 2 mg/dL (ref 1.5–2.5)

## 2016-08-10 MED ORDER — MORPHINE SULFATE 30 MG PO TABS: 30.0000 mg | ORAL_TABLET | Freq: Four times a day (QID) | ORAL | 0 refills | Status: DC | PRN

## 2016-08-10 NOTE — Telephone Encounter (Signed)
Scheduled appt per 6/28 los - Gave patient AVS and calender per los.  

## 2016-08-11 ENCOUNTER — Ambulatory Visit
Admission: RE | Admit: 2016-08-11 | Discharge: 2016-08-11 | Disposition: A | Discharge status: Home / Self Care | Payer: Medicare Other | Source: Ambulatory Visit | Attending: Radiation Oncology | Admitting: Radiation Oncology

## 2016-08-11 ENCOUNTER — Encounter: Payer: Self-pay | Admitting: *Deleted

## 2016-08-11 ENCOUNTER — Ambulatory Visit (HOSPITAL_BASED_OUTPATIENT_CLINIC_OR_DEPARTMENT_OTHER): Payer: Medicare Other

## 2016-08-11 VITALS — BP 107/63 | HR 85 | Temp 98.9°F | Resp 18

## 2016-08-11 DIAGNOSIS — K5900 Constipation, unspecified: Secondary | ICD-10-CM | POA: Diagnosis not present

## 2016-08-11 DIAGNOSIS — C321 Malignant neoplasm of supraglottis: Secondary | ICD-10-CM

## 2016-08-11 DIAGNOSIS — I10 Essential (primary) hypertension: Secondary | ICD-10-CM | POA: Diagnosis not present

## 2016-08-11 DIAGNOSIS — E7800 Pure hypercholesterolemia, unspecified: Secondary | ICD-10-CM | POA: Diagnosis not present

## 2016-08-11 DIAGNOSIS — K219 Gastro-esophageal reflux disease without esophagitis: Secondary | ICD-10-CM | POA: Diagnosis not present

## 2016-08-11 DIAGNOSIS — C3431 Malignant neoplasm of lower lobe, right bronchus or lung: Secondary | ICD-10-CM | POA: Diagnosis not present

## 2016-08-11 DIAGNOSIS — Z5111 Encounter for antineoplastic chemotherapy: Secondary | ICD-10-CM | POA: Diagnosis not present

## 2016-08-11 DIAGNOSIS — C770 Secondary and unspecified malignant neoplasm of lymph nodes of head, face and neck: Secondary | ICD-10-CM | POA: Diagnosis not present

## 2016-08-11 DIAGNOSIS — Z510 Encounter for antineoplastic radiation therapy: Secondary | ICD-10-CM | POA: Diagnosis not present

## 2016-08-11 MED ORDER — CISPLATIN CHEMO INJECTION 100MG/100ML
100.0000 mg/m2 | Freq: Once | INTRAVENOUS | Status: AC
  Administered 2016-08-11: 184 mg via INTRAVENOUS
  Filled 2016-08-11: qty 184

## 2016-08-11 MED ORDER — POTASSIUM CHLORIDE 2 MEQ/ML IV SOLN
Freq: Once | INTRAVENOUS | Status: AC
  Administered 2016-08-11: 12:00:00 via INTRAVENOUS
  Filled 2016-08-11: qty 10

## 2016-08-11 MED ORDER — PALONOSETRON HCL INJECTION 0.25 MG/5ML
INTRAVENOUS | Status: AC
  Filled 2016-08-11: qty 5

## 2016-08-11 MED ORDER — PALONOSETRON HCL INJECTION 0.25 MG/5ML
0.2500 mg | Freq: Once | INTRAVENOUS | Status: AC
Start: 2016-08-11 — End: 2016-08-11
  Administered 2016-08-11: 0.25 mg via INTRAVENOUS

## 2016-08-11 MED ORDER — SODIUM CHLORIDE 0.9% FLUSH
10.0000 mL | INTRAVENOUS | Status: DC | PRN
  Administered 2016-08-11: 10 mL
  Filled 2016-08-11: qty 10

## 2016-08-11 MED ORDER — HEPARIN SOD (PORK) LOCK FLUSH 100 UNIT/ML IV SOLN
500.0000 [IU] | Freq: Once | INTRAVENOUS | Status: AC | PRN
  Administered 2016-08-11: 500 [IU]
  Filled 2016-08-11: qty 5

## 2016-08-11 MED ORDER — SODIUM CHLORIDE 0.9 % IV SOLN
Freq: Once | INTRAVENOUS | Status: AC
  Administered 2016-08-11: 14:00:00 via INTRAVENOUS
  Filled 2016-08-11: qty 5

## 2016-08-11 NOTE — Progress Notes (Signed)
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 LEFT neck lymphadenopathy including LEFT level IIa to level 3 4.7 x 4.6 x 7.6 cm necrotic nodal conglomeration with suspected extracapsular spread of disease. Moderately narrowed hypopharynx.       05/18/2016 Procedure    Technically successful ultrasound-guided core biopsy of left cervical adenopathy.       05/18/2016 Pathology Results    Lymph node, needle/core biopsy, L cerv LAN - SQUAMOUS CELL CARCINOMA, SEE COMMENT. Microscopic Comment Residual lymph node is not seen. There is tumor invading skeletal muscle. p16 weakly positive      06/06/2016 Procedure    He had dental extraction      06/08/2016 PET scan    1. 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 2. Hypermetabolic right lower lobe pulmonary nodule is favored to represent a synchronous primary bronchogenic neoplasm. Isolated pulmonary metastasis could look similar. No thoracic nodal hypermetabolism. 3. Soft tissue hypermetabolism about the right iliac bone and greater trochanter of the proximal right femur are favored to be degenerative or posttraumatic. Soft tissue metastasis felt highly unlikely. 4.  Coronary artery atherosclerosis.  Aortic atherosclerosis. 5. A tiny right lower lobe pulmonary nodule is most likely a subpleural lymph node.      06/15/2016 Procedure    He has near normal baseline hearing test      06/23/2016 Procedure    Ultrasound and fluoroscopically guided right internal jugular single lumen power port catheter insertion. Tip in the SVC/RA junction. Catheter ready for use      06/23/2016 Procedure    Fluoroscopic insertion of a 20-French "pull-through" gastrostomy.      06/28/2016 -  Radiation Therapy    He received radiation      06/30/2016 -  Chemotherapy    He received chemotherapy        INTERVAL HISTORY: Please see below for problem oriented charting. He returns for further follow-up He has significant pain recently He was not able to get prescription fentanyl patch due to cost issue He is able to swallow morphine sulfate He denies recent nausea or constipation Denies peripheral neuropathy  REVIEW OF SYSTEMS:   Constitutional: Denies fevers, chills or abnormal weight loss Eyes: Denies blurriness of vision Respiratory: Denies cough, dyspnea or wheezes Cardiovascular: Denies palpitation, chest discomfort or lower extremity swelling Gastrointestinal:  Denies nausea, heartburn or change in bowel habits Skin: Denies abnormal skin rashes Lymphatics: Denies new lymphadenopathy or easy bruising Neurological:Denies numbness, tingling or new weaknesses Behavioral/Psych: Mood is stable, no new changes  All other systems were reviewed with the patient and are negative.  I have reviewed the past medical history, past surgical history, social history and family history with the patient and they are unchanged from previous note.  ALLERGIES:  has No Known Allergies.  MEDICATIONS:  Current Outpatient Prescriptions  Medication Sig Dispense  Refill  . cyclobenzaprine (FLEXERIL) 10 MG tablet Take 1 tablet (10 mg total) by mouth at bedtime. 30 tablet 1  . diclofenac sodium (VOLTAREN) 1 % GEL  Apply 2 g topically 4 (four) times daily. 1 Tube 2  . emollient (BIAFINE) cream Apply topically as needed.    [name] ALLERGY RELIEF 10 MG tablet TAKE ONE TABLET BY MOUTH ONCE DAILY 90 tablet 3  . gabapentin (NEURONTIN) 300 MG capsule Take 1 capsule (300 mg total) by mouth 3 (three) times daily. 90 capsule 3  . lidocaine (XYLOCAINE) 2 % solution Patient: Mix 1part 2% viscous lidocaine, 1part H20. Swish & swallow 28mL of diluted mixture, 27min before meals and at bedtime, up to QID (Patient not taking: Reported on 07/24/2016) 100 mL 5  . lidocaine-prilocaine (EMLA) cream Apply to affected area once 30 g 3  . LORazepam (ATIVAN) 0.5 MG tablet Take one tablet 20-30 minutes before radiotherapy PRN anxiety/ claustrophobia. 1 tablet 0  . morphine (MSIR) 30 MG tablet Take 1 tablet (30 mg total) by mouth every 6 (six) hours as needed for severe pain. 60 tablet 0  . Nutritional Supplements (FEEDING SUPPLEMENT, OSMOLITE 1.5 CAL,) LIQD Begin Osmolite 1.5 via PEG TID with 60 mL free water. Advance as tolerated to goal of 1 1/2 bottles QID with 60 mL free water before and after. In addition, drink or flush PEG with 240 mL free water TID. 6 Bottle 11  . ondansetron (ZOFRAN) 8 MG tablet Take 1 tablet (8 mg total) by mouth 2 (two) times daily as needed. Start on the third day after chemotherapy. 30 tablet 1  . prochlorperazine (COMPAZINE) 10 MG tablet Take 1 tablet (10 mg total) by mouth every 6 (six) hours as needed (Nausea or vomiting). 30 tablet 1  . scopolamine (TRANSDERM-SCOP) 1 MG/3DAYS Place 1 patch (1.5 mg total) onto the skin every 3 (three) days. (Patient not taking: Reported on 07/31/2016) 10 patch 12  . senna-docusate (SENOKOT-S) 8.6-50 MG tablet Take 1 tablet by mouth 2 (two) times daily. 60 tablet 3   No current facility-administered medications for this visit.     PHYSICAL EXAMINATION: ECOG PERFORMANCE STATUS: 1 - Symptomatic but completely ambulatory  Vitals:   08/10/16 1404  BP: (!) 111/57  Pulse:  93  Resp: 17  Temp: 99 F (37.2 C)   Filed Weights   08/10/16 1404  Weight: 146 lb 1.6 oz (66.3 kg)    GENERAL:alert, no distress and comfortable SKIN: skin color, texture, turgor are normal, no rashes or significant lesions EYES: normal, Conjunctiva are pink and non-injected, sclera clear OROPHARYNX: Noted mucositis.  No thrush NECK: supple, thyroid normal size, non-tender, without nodularity LYMPH: Previously palpable lymphadenopathy has reduced in size LUNGS: clear to auscultation and percussion with normal breathing effort HEART: regular rate & rhythm and no murmurs and no lower extremity edema ABDOMEN:abdomen soft, non-tender and normal bowel sounds Musculoskeletal:no cyanosis of digits and no clubbing  NEURO: alert & oriented x 3 with fluent speech, no focal motor/sensory deficits  LABORATORY DATA:  I have reviewed the data as listed    Component Value Date/Time   NA 139 08/10/2016 1426   K 3.6 08/10/2016 1426   CL 97 (L) 06/23/2016 0954   CO2 31 (H) 08/10/2016 1426   GLUCOSE 148 (H) 08/10/2016 1426   BUN 10.6 08/10/2016 1426   CREATININE 0.8 08/10/2016 1426   CALCIUM 9.6 08/10/2016 1426   PROT 6.5 08/10/2016 1426   ALBUMIN 3.2 (L) 08/10/2016 1426   AST  12 08/10/2016 1426   ALT 11 08/10/2016 1426   ALKPHOS 58 08/10/2016 1426   BILITOT 0.42 08/10/2016 1426   GFRNONAA >60 06/23/2016 0954   GFRAA >60 06/23/2016 0954    No results found for: SPEP, UPEP  Lab Results  Component Value Date   WBC 2.2 (L) 08/10/2016   NEUTROABS 1.7 08/10/2016   HGB 10.7 (L) 08/10/2016   HCT 31.8 (L) 08/10/2016   MCV 82.1 08/10/2016   PLT 213 08/10/2016      Chemistry      Component Value Date/Time   NA 139 08/10/2016 1426   K 3.6 08/10/2016 1426   CL 97 (L) 06/23/2016 0954   CO2 31 (H) 08/10/2016 1426   BUN 10.6 08/10/2016 1426   CREATININE 0.8 08/10/2016 1426      Component Value Date/Time   CALCIUM 9.6 08/10/2016 1426   ALKPHOS 58 08/10/2016 1426   AST 12 08/10/2016  1426   ALT 11 08/10/2016 1426   BILITOT 0.42 08/10/2016 1426       ASSESSMENT & PLAN:  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  He is not symptomatic.  Continue close observation We will proceed with final treatment without dose adjustment.  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  All questions were answered. The patient knows to call the clinic with any problems, questions or concerns. No barriers to learning was detected. I spent 15 minutes counseling the patient face to face. The total time spent in the appointment was 20 minutes and more than 50% was on counseling and review of test results     [name], MD 08/11/2016 7:16 AM

## 2016-08-11 NOTE — Assessment & Plan Note (Signed)
The patient was not able to pay for fentanyl patch for pain I recommend increasing the dose of MSIR to 30 mg to take as needed

## 2016-08-11 NOTE — Progress Notes (Signed)
Per Dr. Alen Blew, ok to proceed with treatment with current urine output.

## 2016-08-11 NOTE — Assessment & Plan Note (Signed)
This is due to recent treatment.  He is not symptomatic.  Continue close observation We will proceed with final treatment without dose adjustment.

## 2016-08-11 NOTE — Assessment & Plan Note (Signed)
He tolerated treatment well but has started to experience some expected side effects from treatment I will continue to see him on a weekly basis and he will continue chemotherapy as prescribed Clinically, he has positive response to treatment

## 2016-08-11 NOTE — Assessment & Plan Note (Signed)
The neck mass is smaller in size He has positive response to treatment Continue to monitor closely

## 2016-08-11 NOTE — Progress Notes (Signed)
Oncology Nurse Navigator Documentation  To provide support, encouragement and care continuity, met with Antonio [name] during Est Pt appt with [name]. He presented with small amount of dry desquamation in radiation treatment area.  I later provided him with several packets of antibiotic ointment, explained application; encouraged continued application of Biafine cream BID to treatment area.  He voiced understanding. He reported:  Mild throat pain.  He stated he did not fill analgesic Rx previously written by [name], "Didn't have the money."  I will inquire about the status of Antonio [name] with Financial Counseling.  Instilling 5-6 cans/daily nutritional supplement via PEG.  Weight is stable from previous visit at 146 lbs. He voiced understanding final RT is next Thursday. He knows to contact me with needs/concerns.  [name], RN, BSN, [place] Neck Oncology Nurse [place] at [place] [phone]

## 2016-08-11 NOTE — Progress Notes (Signed)
Oncology Nurse Navigator Documentation  Received follow-up to my IB from [facility] indicating Mr. Stockham failed to bring in documentation for grant, that she called him this morning to remind him.  Saw him in lobby where he had arrived for Infusion, he indicated he had brought documentation.  I informed Jenny Reichmann, she agreed to meet with him in Infusion, indicated if he brought appropriate financial information he should be able to pick up Rx at [facility].  [name], RN, BSN, [place] [facility]ck Oncology Nurse [place] at [place] [phone]

## 2016-08-11 NOTE — Patient Instructions (Signed)
Cancer Center Discharge Instructions for Patients Receiving Chemotherapy  Today you received the following chemotherapy agents: Cisplatin   To help prevent nausea and vomiting after your treatment, we encourage you to take your nausea medication as directed.    If you develop nausea and vomiting that is not controlled by your nausea medication, call the clinic.   BELOW ARE SYMPTOMS THAT SHOULD BE REPORTED IMMEDIATELY:  *FEVER GREATER THAN 100.5 F  *CHILLS WITH OR WITHOUT FEVER  NAUSEA AND VOMITING THAT IS NOT CONTROLLED WITH YOUR NAUSEA MEDICATION  *UNUSUAL SHORTNESS OF BREATH  *UNUSUAL BRUISING OR BLEEDING  TENDERNESS IN MOUTH AND THROAT WITH OR WITHOUT PRESENCE OF ULCERS  *URINARY PROBLEMS  *BOWEL PROBLEMS  UNUSUAL RASH Items with * indicate a potential emergency and should be followed up as soon as possible.  Feel free to call the clinic you have any questions or concerns. The clinic phone number is [phone].  Please show the CHEMO ALERT CARD at check-in to the Emergency Department and triage nurse.   

## 2016-08-14 ENCOUNTER — Other Ambulatory Visit (HOSPITAL_BASED_OUTPATIENT_CLINIC_OR_DEPARTMENT_OTHER): Payer: Medicare Other

## 2016-08-14 ENCOUNTER — Ambulatory Visit
Admission: RE | Admit: 2016-08-14 | Discharge: 2016-08-14 | Disposition: A | Discharge status: Home / Self Care | Payer: Medicare Other | Source: Ambulatory Visit | Attending: Radiation Oncology | Admitting: Radiation Oncology

## 2016-08-14 DIAGNOSIS — C321 Malignant neoplasm of supraglottis: Secondary | ICD-10-CM

## 2016-08-14 DIAGNOSIS — I10 Essential (primary) hypertension: Secondary | ICD-10-CM | POA: Diagnosis not present

## 2016-08-14 DIAGNOSIS — C3431 Malignant neoplasm of lower lobe, right bronchus or lung: Secondary | ICD-10-CM | POA: Diagnosis not present

## 2016-08-14 DIAGNOSIS — E7800 Pure hypercholesterolemia, unspecified: Secondary | ICD-10-CM | POA: Diagnosis not present

## 2016-08-14 DIAGNOSIS — Z510 Encounter for antineoplastic radiation therapy: Secondary | ICD-10-CM | POA: Diagnosis not present

## 2016-08-14 DIAGNOSIS — K219 Gastro-esophageal reflux disease without esophagitis: Secondary | ICD-10-CM | POA: Diagnosis not present

## 2016-08-14 DIAGNOSIS — K5900 Constipation, unspecified: Secondary | ICD-10-CM | POA: Diagnosis not present

## 2016-08-14 LAB — CBC WITH DIFFERENTIAL/PLATELET
BASO%: 0.4 % (ref 0.0–2.0)
Basophils Absolute: 0 10*3/uL (ref 0.0–0.1)
EOS ABS: 0 10*3/uL (ref 0.0–0.5)
EOS%: 1.2 % (ref 0.0–7.0)
HCT: 32 % — ABNORMAL LOW (ref 38.4–49.9)
HGB: 10.9 g/dL — ABNORMAL LOW (ref 13.0–17.1)
LYMPH%: 3.2 % — AB (ref 14.0–49.0)
MCH: 27.6 pg (ref 27.2–33.4)
MCHC: 33.9 g/dL (ref 32.0–36.0)
MCV: 81.3 fL (ref 79.3–98.0)
MONO#: 0.5 10*3/uL (ref 0.1–0.9)
MONO%: 15.4 % — ABNORMAL HIGH (ref 0.0–14.0)
NEUT%: 79.8 % — AB (ref 39.0–75.0)
NEUTROS ABS: 2.7 10*3/uL (ref 1.5–6.5)
PLATELETS: 227 10*3/uL (ref 140–400)
RBC: 3.94 10*6/uL — AB (ref 4.20–5.82)
RDW: 16.7 % — ABNORMAL HIGH (ref 11.0–14.6)
WBC: 3.3 10*3/uL — AB (ref 4.0–10.3)
lymph#: 0.1 10*3/uL — ABNORMAL LOW (ref 0.9–3.3)

## 2016-08-14 LAB — COMPREHENSIVE METABOLIC PANEL
ALT: 26 U/L (ref 0–55)
AST: 21 U/L (ref 5–34)
Albumin: 3.2 g/dL — ABNORMAL LOW (ref 3.5–5.0)
Alkaline Phosphatase: 56 U/L (ref 40–150)
Anion Gap: 11 mEq/L (ref 3–11)
BUN: 24.6 mg/dL (ref 7.0–26.0)
CHLORIDE: 99 meq/L (ref 98–109)
CO2: 32 meq/L — AB (ref 22–29)
Calcium: 9.5 mg/dL (ref 8.4–10.4)
Creatinine: 1.5 mg/dL — ABNORMAL HIGH (ref 0.7–1.3)
EGFR: 57 mL/min/{1.73_m2} — ABNORMAL LOW (ref >90)
GLUCOSE: 102 mg/dL (ref 70–140)
POTASSIUM: 3.6 meq/L (ref 3.5–5.1)
SODIUM: 142 meq/L (ref 136–145)
Total Bilirubin: 0.43 mg/dL (ref 0.20–1.20)
Total Protein: 6.5 g/dL (ref 6.4–8.3)

## 2016-08-14 LAB — MAGNESIUM: MAGNESIUM: 1.9 mg/dL (ref 1.5–2.5)

## 2016-08-14 MED FILL — LORazepam 0.5 MG TABS: 0.5 | 1 days supply | Qty: 1 | Fill #0

## 2016-08-14 MED FILL — MORPHINE SULFATE IR 30 MG T: 30 | 15 days supply | Qty: 60 | Fill #0

## 2016-08-15 ENCOUNTER — Ambulatory Visit
Admission: RE | Admit: 2016-08-15 | Discharge: 2016-08-15 | Disposition: A | Discharge status: Home / Self Care | Payer: Medicare Other | Source: Ambulatory Visit | Attending: Radiation Oncology | Admitting: Radiation Oncology

## 2016-08-15 ENCOUNTER — Ambulatory Visit: Payer: Medicare Other

## 2016-08-15 DIAGNOSIS — E7800 Pure hypercholesterolemia, unspecified: Secondary | ICD-10-CM | POA: Diagnosis not present

## 2016-08-15 DIAGNOSIS — K219 Gastro-esophageal reflux disease without esophagitis: Secondary | ICD-10-CM | POA: Diagnosis not present

## 2016-08-15 DIAGNOSIS — Z510 Encounter for antineoplastic radiation therapy: Secondary | ICD-10-CM | POA: Diagnosis not present

## 2016-08-15 DIAGNOSIS — C321 Malignant neoplasm of supraglottis: Secondary | ICD-10-CM | POA: Diagnosis not present

## 2016-08-15 DIAGNOSIS — K5900 Constipation, unspecified: Secondary | ICD-10-CM | POA: Diagnosis not present

## 2016-08-15 DIAGNOSIS — C3431 Malignant neoplasm of lower lobe, right bronchus or lung: Secondary | ICD-10-CM | POA: Diagnosis not present

## 2016-08-15 DIAGNOSIS — I10 Essential (primary) hypertension: Secondary | ICD-10-CM | POA: Diagnosis not present

## 2016-08-16 ENCOUNTER — Ambulatory Visit: Payer: Medicare Other

## 2016-08-17 ENCOUNTER — Ambulatory Visit: Payer: Medicare Other

## 2016-08-17 ENCOUNTER — Ambulatory Visit
Admission: RE | Admit: 2016-08-17 | Discharge: 2016-08-17 | Disposition: A | Discharge status: Home / Self Care | Payer: Medicare Other | Source: Ambulatory Visit | Attending: Radiation Oncology | Admitting: Radiation Oncology

## 2016-08-17 DIAGNOSIS — E7800 Pure hypercholesterolemia, unspecified: Secondary | ICD-10-CM | POA: Diagnosis not present

## 2016-08-17 DIAGNOSIS — C3431 Malignant neoplasm of lower lobe, right bronchus or lung: Secondary | ICD-10-CM | POA: Diagnosis not present

## 2016-08-17 DIAGNOSIS — I10 Essential (primary) hypertension: Secondary | ICD-10-CM | POA: Diagnosis not present

## 2016-08-17 DIAGNOSIS — K5900 Constipation, unspecified: Secondary | ICD-10-CM | POA: Diagnosis not present

## 2016-08-17 DIAGNOSIS — K219 Gastro-esophageal reflux disease without esophagitis: Secondary | ICD-10-CM | POA: Diagnosis not present

## 2016-08-17 DIAGNOSIS — C321 Malignant neoplasm of supraglottis: Secondary | ICD-10-CM | POA: Diagnosis not present

## 2016-08-17 DIAGNOSIS — Z510 Encounter for antineoplastic radiation therapy: Secondary | ICD-10-CM | POA: Diagnosis not present

## 2016-08-18 ENCOUNTER — Telehealth: Payer: Self-pay | Admitting: *Deleted

## 2016-08-18 NOTE — Telephone Encounter (Signed)
LM with note below. Asked to call us to confirm receipt of message

## 2016-08-18 NOTE — Telephone Encounter (Signed)
-----   Message from [name], MD sent at 08/18/2016  7:11 AM EDT ----- Regarding: labs PLs let him know recent labs showed elevated creatinine Try to get him drink more liquids or through PEG tube, if unable, schedule IVF ----- Message ----- From: Interface, Lab In Three Zero One Sent: 08/14/2016   2:55 PM To: [name], MD

## 2016-08-19 ENCOUNTER — Telehealth: Payer: Self-pay | Admitting: *Deleted

## 2016-08-19 NOTE — Telephone Encounter (Signed)
Unable to get Mr Antonio [name] on phone. (left message Friday) Called sister and asked her to relay message.  Labs showed elevated creatinine. Needs to drink more liquids or through PEG tube, if unable, please call us so we can give him IV fluids. Verbalized understanding

## 2016-08-21 ENCOUNTER — Other Ambulatory Visit: Payer: Self-pay

## 2016-08-24 ENCOUNTER — Ambulatory Visit (HOSPITAL_BASED_OUTPATIENT_CLINIC_OR_DEPARTMENT_OTHER): Payer: Medicare Other | Admitting: Hematology and Oncology

## 2016-08-24 ENCOUNTER — Ambulatory Visit (HOSPITAL_BASED_OUTPATIENT_CLINIC_OR_DEPARTMENT_OTHER): Payer: Medicare Other

## 2016-08-24 ENCOUNTER — Telehealth: Payer: Self-pay | Admitting: Hematology and Oncology

## 2016-08-24 DIAGNOSIS — G893 Neoplasm related pain (acute) (chronic): Secondary | ICD-10-CM

## 2016-08-24 DIAGNOSIS — C770 Secondary and unspecified malignant neoplasm of lymph nodes of head, face and neck: Secondary | ICD-10-CM | POA: Diagnosis not present

## 2016-08-24 DIAGNOSIS — R630 Anorexia: Secondary | ICD-10-CM | POA: Diagnosis not present

## 2016-08-24 DIAGNOSIS — R471 Dysarthria and anarthria: Secondary | ICD-10-CM

## 2016-08-24 DIAGNOSIS — R634 Abnormal weight loss: Secondary | ICD-10-CM

## 2016-08-24 DIAGNOSIS — C321 Malignant neoplasm of supraglottis: Secondary | ICD-10-CM | POA: Diagnosis not present

## 2016-08-24 DIAGNOSIS — N179 Acute kidney failure, unspecified: Secondary | ICD-10-CM | POA: Diagnosis not present

## 2016-08-24 DIAGNOSIS — D61810 Antineoplastic chemotherapy induced pancytopenia: Secondary | ICD-10-CM

## 2016-08-24 DIAGNOSIS — D61818 Other pancytopenia: Secondary | ICD-10-CM

## 2016-08-24 DIAGNOSIS — M5430 Sciatica, unspecified side: Secondary | ICD-10-CM

## 2016-08-24 LAB — MAGNESIUM: Magnesium: 1.9 mg/dl (ref 1.5–2.5)

## 2016-08-24 LAB — CBC WITH DIFFERENTIAL/PLATELET
BASO%: 0.3 % (ref 0.0–2.0)
Basophils Absolute: 0 10*3/uL (ref 0.0–0.1)
EOS%: 0 % (ref 0.0–7.0)
Eosinophils Absolute: 0 10*3/uL (ref 0.0–0.5)
HCT: 29.1 % — ABNORMAL LOW (ref 38.4–49.9)
HGB: 9.8 g/dL — ABNORMAL LOW (ref 13.0–17.1)
LYMPH%: 8.1 % — AB (ref 14.0–49.0)
MCH: 27.2 pg (ref 27.2–33.4)
MCHC: 33.7 g/dL (ref 32.0–36.0)
MCV: 80.8 fL (ref 79.3–98.0)
MONO#: 0.3 10*3/uL (ref 0.1–0.9)
MONO%: 8.7 % (ref 0.0–14.0)
NEUT#: 2.6 10*3/uL (ref 1.5–6.5)
NEUT%: 82.9 % — AB (ref 39.0–75.0)
Platelets: 101 10*3/uL — ABNORMAL LOW (ref 140–400)
RBC: 3.6 10*6/uL — AB (ref 4.20–5.82)
RDW: 16.4 % — ABNORMAL HIGH (ref 11.0–14.6)
WBC: 3.1 10*3/uL — ABNORMAL LOW (ref 4.0–10.3)
lymph#: 0.3 10*3/uL — ABNORMAL LOW (ref 0.9–3.3)

## 2016-08-24 LAB — COMPREHENSIVE METABOLIC PANEL
ALBUMIN: 3.3 g/dL — AB (ref 3.5–5.0)
ALK PHOS: 66 U/L (ref 40–150)
ALT: 13 U/L (ref 0–55)
AST: 14 U/L (ref 5–34)
Anion Gap: 8 mEq/L (ref 3–11)
BUN: 13.9 mg/dL (ref 7.0–26.0)
CO2: 35 mEq/L — ABNORMAL HIGH (ref 22–29)
Calcium: 9.4 mg/dL (ref 8.4–10.4)
Chloride: 95 mEq/L — ABNORMAL LOW (ref 98–109)
Creatinine: 1.4 mg/dL — ABNORMAL HIGH (ref 0.7–1.3)
EGFR: 62 mL/min/{1.73_m2} — ABNORMAL LOW (ref >90)
GLUCOSE: 128 mg/dL (ref 70–140)
POTASSIUM: 3.5 meq/L (ref 3.5–5.1)
SODIUM: 138 meq/L (ref 136–145)
TOTAL PROTEIN: 6.7 g/dL (ref 6.4–8.3)
Total Bilirubin: 0.51 mg/dL (ref 0.20–1.20)

## 2016-08-24 MED ORDER — GABAPENTIN 300 MG PO CAPS: 300.0000 mg | ORAL_CAPSULE | Freq: Three times a day (TID) | ORAL | 3 refills | Status: AC

## 2016-08-24 MED ORDER — MORPHINE SULFATE 30 MG PO TABS: 30.0000 mg | ORAL_TABLET | Freq: Four times a day (QID) | ORAL | 0 refills | Status: DC | PRN

## 2016-08-24 NOTE — Telephone Encounter (Signed)
Scheduled appt per 7/12 los - Gave patient AVS and calender per los.

## 2016-08-25 ENCOUNTER — Encounter: Payer: Self-pay | Admitting: Radiation Oncology

## 2016-08-25 NOTE — Progress Notes (Signed)
  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 He experienced radiation related skin changes including erythema with patchy mucositis in the oropharynx, and some early superficial desquamation over his neck that was predominately dry. The patient experienced pain to his throat throughout treatment, particularly when swallowing.  Plan: The patient has completed radiation treatment. The patient will continue to use Biafine to the skin within the treatment fields. The patient will return to radiation oncology clinic for routine followup in one half month. I advised the patient to call or return sooner if any questions or concerns arise that are related to recovery or treatment.  -----------------------------------  [name], MD  This document serves as a record of services personally performed by [name], MD. It was created on her behalf by Maryla Morrow, a trained medical scribe. The creation of this record is based on the scribe's personal observations and the provider's statements to them. This document has been checked and approved by the attending provider.

## 2016-08-26 ENCOUNTER — Encounter: Payer: Self-pay | Admitting: Hematology and Oncology

## 2016-08-26 DIAGNOSIS — N179 Acute kidney failure, unspecified: Secondary | ICD-10-CM | POA: Insufficient documentation

## 2016-08-26 NOTE — Assessment & Plan Note (Signed)
He has recent prerenal failure I encouraged him to increase oral hydration as tolerated His creatinine is stable Continue close monitoring on a weekly basis

## 2016-08-26 NOTE — Assessment & Plan Note (Signed)
He had recently poorly controlled pain IR morphine was increased to 30 mg and he felt it is controlling his pain better We will continue the same I refill his prescription and will continue to assess on the weekly basis

## 2016-08-26 NOTE — Assessment & Plan Note (Signed)
He has completed all the treatment and is still experiencing side effects of treatment I will continue to see him on a weekly basis for supportive care

## 2016-08-26 NOTE — Assessment & Plan Note (Signed)
This is due to recent treatment.  He is not symptomatic.  Continue close observation

## 2016-08-26 NOTE — Assessment & Plan Note (Signed)
The size of the tumor is shrinking Continue close observation

## 2016-08-26 NOTE — Assessment & Plan Note (Signed)
He had profound weight loss since the last time I saw him I spent a lot of time educating him the importance of increase oral intake as tolerated and to increased amount of nutriitonal feeding through his feeding tube

## 2016-08-26 NOTE — Assessment & Plan Note (Signed)
This is multifactorial, combination related to pain and discomfort He will continue close follow-up with speech and language therapist

## 2016-08-26 NOTE — Progress Notes (Signed)
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 LEFT neck lymphadenopathy including LEFT level IIa to level 3 4.7 x 4.6 x 7.6 cm necrotic nodal conglomeration with suspected extracapsular spread of disease. Moderately narrowed hypopharynx.       05/18/2016 Procedure    Technically successful ultrasound-guided core biopsy of left cervical adenopathy.       05/18/2016 Pathology Results    Lymph node, needle/core biopsy, L cerv LAN - SQUAMOUS CELL CARCINOMA, SEE COMMENT. Microscopic Comment Residual lymph node is not seen. There is tumor invading skeletal muscle. p16 weakly positive      06/06/2016 Procedure    He had dental extraction      06/08/2016 PET scan    1. 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 2. Hypermetabolic right lower lobe pulmonary nodule is favored to represent a synchronous primary bronchogenic neoplasm. Isolated pulmonary metastasis could look similar. No thoracic nodal hypermetabolism. 3. Soft tissue hypermetabolism about the right iliac bone and greater trochanter of the proximal right femur are favored to be degenerative or posttraumatic. Soft tissue metastasis felt highly unlikely. 4.  Coronary artery atherosclerosis.  Aortic atherosclerosis. 5. A tiny right lower lobe pulmonary nodule is most likely a subpleural lymph node.      06/15/2016 Procedure    He has near normal baseline hearing test      06/23/2016 Procedure    Ultrasound and fluoroscopically guided right internal jugular single lumen power port catheter insertion. Tip in the SVC/RA junction. Catheter ready for use      06/23/2016 Procedure    Fluoroscopic insertion of a 20-French "pull-through" gastrostomy.      06/28/2016 - 08/17/2016 Radiation Therapy    He received radiation      06/30/2016 - 08/11/2016 Chemotherapy    He received chemotherapy with high dose cisplatin       INTERVAL HISTORY: Please see below for problem oriented charting. He returns for further follow-up He has lost 6 pounds since the last time I saw him. He is only  drinking liquids by mouth.  He is not using his feeding tube as much as he should His pain is better controlled with IR morphine He denies recent constipation, nausea or vomiting. He denies recent smoking or alcohol use  REVIEW OF SYSTEMS:   Constitutional: Denies fevers, chills  Eyes: Denies blurriness of vision Respiratory: Denies cough, dyspnea or wheezes Cardiovascular: Denies palpitation, chest discomfort or lower extremity swelling Gastrointestinal:  Denies nausea, heartburn or change in bowel habits Skin: Denies abnormal skin rashes Lymphatics: Denies new lymphadenopathy or easy bruising Neurological:Denies numbness, tingling or new weaknesses Behavioral/Psych: Mood is stable, no new changes  All other systems were reviewed with the patient and are negative.  I have reviewed the past medical history, past surgical history, social history and family history with the patient and they are  unchanged from previous note.  ALLERGIES:  has No Known Allergies.  MEDICATIONS:  Current Outpatient Prescriptions  Medication Sig Dispense Refill  . cyclobenzaprine (FLEXERIL) 10 MG tablet Take 1 tablet  (10 mg total) by mouth at bedtime. 30 tablet 1  . diclofenac sodium (VOLTAREN) 1 % GEL Apply 2 g topically 4 (four) times daily. 1 Tube 2  . emollient (BIAFINE) cream Apply topically as needed.    [name] ALLERGY RELIEF 10 MG tablet TAKE ONE TABLET BY MOUTH ONCE DAILY 90 tablet 3  . gabapentin (NEURONTIN) 300 MG capsule Take 1 capsule (300 mg total) by mouth 3 (three) times daily. 90 capsule 3  . lidocaine (XYLOCAINE) 2 % solution Patient: Mix 1part 2% viscous lidocaine, 1part H20. Swish & swallow 41mL of diluted mixture, 45min before meals and at bedtime, up to QID (Patient not taking: Reported on 07/24/2016) 100 mL 5  . lidocaine-prilocaine (EMLA) cream Apply to affected area once 30 g 3  . LORazepam (ATIVAN) 0.5 MG tablet Take one tablet 20-30 minutes before radiotherapy PRN anxiety/ claustrophobia. 1 tablet 0  . morphine (MSIR) 30 MG tablet Take 1 tablet (30 mg total) by mouth every 6 (six) hours as needed for severe pain. 60 tablet 0  . Nutritional Supplements (FEEDING SUPPLEMENT, OSMOLITE 1.5 CAL,) LIQD Begin Osmolite 1.5 via PEG TID with 60 mL free water. Advance as tolerated to goal of 1 1/2 bottles QID with 60 mL free water before and after. In addition, drink or flush PEG with 240 mL free water TID. 6 Bottle 11  . ondansetron (ZOFRAN) 8 MG tablet Take 1 tablet (8 mg total) by mouth 2 (two) times daily as needed. Start on the third day after chemotherapy. 30 tablet 1  . prochlorperazine (COMPAZINE) 10 MG tablet Take 1 tablet (10 mg total) by mouth every 6 (six) hours as needed (Nausea or vomiting). 30 tablet 1  . scopolamine (TRANSDERM-SCOP) 1 MG/3DAYS Place 1 patch (1.5 mg total) onto the skin every 3 (three) days. (Patient not taking: Reported on 07/31/2016) 10 patch 12  . senna-docusate (SENOKOT-S) 8.6-50 MG tablet Take 1 tablet by mouth 2 (two) times daily. 60 tablet 3   No current facility-administered medications for this visit.     PHYSICAL EXAMINATION: ECOG PERFORMANCE STATUS: 1 -  Symptomatic but completely ambulatory  Vitals:   08/24/16 1052  BP: 132/68  Pulse: 96  Resp: 19  Temp: 98.4 F (36.9 C)   Filed Weights   08/24/16 1052  Weight: 140 lb (63.5 kg)    GENERAL:alert, no distress and comfortable SKIN: skin color, texture, turgor are normal, no rashes or significant lesions EYES: normal, Conjunctiva are pink and non-injected, sclera clear OROPHARYNX:no exudate, no erythema and lips, buccal mucosa, and tongue normal  NECK: supple, thyroid normal size, non-tender, without nodularity LYMPH: Previously palpable left neck mass is smaller LUNGS: clear to auscultation and percussion with normal breathing effort HEART: regular rate & rhythm and no murmurs and no lower extremity edema ABDOMEN:abdomen soft, non-tender and normal bowel sounds Musculoskeletal:no cyanosis of digits and no clubbing  NEURO: alert & oriented x 3 with fluent speech, no focal motor/sensory deficits  LABORATORY DATA:  I have reviewed the data as listed    Component Value Date/Time   NA 138 08/24/2016 1044   K 3.5 08/24/2016 1044   CL 97 (L) 06/23/2016 0954   CO2 35 (H) 08/24/2016 1044   GLUCOSE 128 08/24/2016 1044   BUN 13.9 08/24/2016 1044   CREATININE 1.4 (H)  08/24/2016 1044   CALCIUM 9.4 08/24/2016 1044   PROT 6.7 08/24/2016 1044   ALBUMIN 3.3 (L) 08/24/2016 1044   AST 14 08/24/2016 1044   ALT 13 08/24/2016 1044   ALKPHOS 66 08/24/2016 1044   BILITOT 0.51 08/24/2016 1044   GFRNONAA >60 06/23/2016 0954   GFRAA >60 06/23/2016 0954    No results found for: SPEP, UPEP  Lab Results  Component Value Date   WBC 3.1 (L) 08/24/2016   NEUTROABS 2.6 08/24/2016   HGB 9.8 (L) 08/24/2016   HCT 29.1 (L) 08/24/2016   MCV 80.8 08/24/2016   PLT 101 (L) 08/24/2016      Chemistry      Component Value Date/Time   NA 138 08/24/2016 1044   K 3.5 08/24/2016 1044   CL 97 (L) 06/23/2016 0954   CO2 35 (H) 08/24/2016 1044   BUN 13.9 08/24/2016 1044   CREATININE 1.4 (H) 08/24/2016  1044      Component Value Date/Time   CALCIUM 9.4 08/24/2016 1044   ALKPHOS 66 08/24/2016 1044   AST 14 08/24/2016 1044   ALT 13 08/24/2016 1044   BILITOT 0.51 08/24/2016 1044      ASSESSMENT & PLAN:  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[place]) He has completed all the treatment and is still experiencing side effects of treatment I will continue to see him on a weekly basis for supportive care  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) The size of the tumor is shrinking Continue close observation  Cancer associated pain He had recently poorly controlled pain IR morphine was increased to 30 mg and he felt it is controlling his pain better We will continue the same I refill his prescription and will continue to assess on the weekly basis  Dysarthria This is multifactorial, combination related to pain and discomfort He will continue close follow-up with speech and language therapist  Weight loss He had profound weight loss since the last time I saw him I spent a lot of time educating him the importance of increase oral intake as tolerated and to increased amount of nutriitonal feeding through his feeding tube  Pancytopenia, acquired ([place]) This is due to recent treatment.  He is not symptomatic.  Continue close observation  Prerenal acute renal failure ([place]) He has recent prerenal failure I encouraged him to increase oral hydration as tolerated His creatinine is stable Continue close monitoring on a weekly basis   No orders of the defined types were placed in this encounter.  All questions were answered. The patient knows to call the clinic with any problems, questions or concerns. No barriers to learning was detected. I spent 25 minutes counseling the patient face to face. The total time spent in the appointment was 30 minutes and more than 50% was on counseling and review of test results     [name], MD 08/26/2016 9:21 AM

## 2016-08-30 ENCOUNTER — Other Ambulatory Visit (HOSPITAL_BASED_OUTPATIENT_CLINIC_OR_DEPARTMENT_OTHER): Payer: Medicare Other

## 2016-08-30 ENCOUNTER — Encounter: Payer: Self-pay | Admitting: Nutrition

## 2016-08-30 ENCOUNTER — Encounter: Payer: Self-pay | Admitting: Hematology and Oncology

## 2016-08-30 ENCOUNTER — Ambulatory Visit (HOSPITAL_BASED_OUTPATIENT_CLINIC_OR_DEPARTMENT_OTHER): Payer: Medicare Other | Admitting: Hematology and Oncology

## 2016-08-30 DIAGNOSIS — D638 Anemia in other chronic diseases classified elsewhere: Secondary | ICD-10-CM | POA: Diagnosis not present

## 2016-08-30 DIAGNOSIS — C770 Secondary and unspecified malignant neoplasm of lymph nodes of head, face and neck: Secondary | ICD-10-CM | POA: Diagnosis not present

## 2016-08-30 DIAGNOSIS — G893 Neoplasm related pain (acute) (chronic): Secondary | ICD-10-CM | POA: Diagnosis not present

## 2016-08-30 DIAGNOSIS — C321 Malignant neoplasm of supraglottis: Secondary | ICD-10-CM | POA: Diagnosis not present

## 2016-08-30 DIAGNOSIS — R471 Dysarthria and anarthria: Secondary | ICD-10-CM

## 2016-08-30 LAB — CBC WITH DIFFERENTIAL/PLATELET
BASO%: 0.2 % (ref 0.0–2.0)
Basophils Absolute: 0 10*3/uL (ref 0.0–0.1)
EOS%: 0.7 % (ref 0.0–7.0)
Eosinophils Absolute: 0 10*3/uL (ref 0.0–0.5)
HCT: 27.3 % — ABNORMAL LOW (ref 38.4–49.9)
HGB: 9.2 g/dL — ABNORMAL LOW (ref 13.0–17.1)
LYMPH%: 3.1 % — AB (ref 14.0–49.0)
MCH: 27.3 pg (ref 27.2–33.4)
MCHC: 33.8 g/dL (ref 32.0–36.0)
MCV: 80.7 fL (ref 79.3–98.0)
MONO#: 0.7 10*3/uL (ref 0.1–0.9)
MONO%: 16 % — ABNORMAL HIGH (ref 0.0–14.0)
NEUT#: 3.4 10*3/uL (ref 1.5–6.5)
NEUT%: 80 % — AB (ref 39.0–75.0)
Platelets: 242 10*3/uL (ref 140–400)
RBC: 3.38 10*6/uL — AB (ref 4.20–5.82)
RDW: 17.6 % — ABNORMAL HIGH (ref 11.0–14.6)
WBC: 4.3 10*3/uL (ref 4.0–10.3)
lymph#: 0.1 10*3/uL — ABNORMAL LOW (ref 0.9–3.3)

## 2016-08-30 LAB — COMPREHENSIVE METABOLIC PANEL
ALBUMIN: 3.2 g/dL — AB (ref 3.5–5.0)
ALK PHOS: 55 U/L (ref 40–150)
ALT: 8 U/L (ref 0–55)
AST: 12 U/L (ref 5–34)
Anion Gap: 9 mEq/L (ref 3–11)
BILIRUBIN TOTAL: 0.32 mg/dL (ref 0.20–1.20)
BUN: 15.8 mg/dL (ref 7.0–26.0)
CALCIUM: 9.8 mg/dL (ref 8.4–10.4)
CO2: 34 mEq/L — ABNORMAL HIGH (ref 22–29)
Chloride: 91 mEq/L — ABNORMAL LOW (ref 98–109)
Creatinine: 1.3 mg/dL (ref 0.7–1.3)
EGFR: 67 mL/min/{1.73_m2} — AB (ref >90)
GLUCOSE: 129 mg/dL (ref 70–140)
Potassium: 3.5 mEq/L (ref 3.5–5.1)
SODIUM: 135 meq/L — AB (ref 136–145)
TOTAL PROTEIN: 6.6 g/dL (ref 6.4–8.3)

## 2016-08-30 LAB — MAGNESIUM: Magnesium: 1.9 mg/dl (ref 1.5–2.5)

## 2016-08-30 LAB — TECHNOLOGIST REVIEW

## 2016-08-30 MED FILL — MORPHINE SULFATE IR 30 MG T: 30 | 15 days supply | Qty: 60 | Fill #0

## 2016-08-30 MED FILL — GABAPENTIN 300 MG CAPSULE: 300 | 30 days supply | Qty: 90 | Fill #0

## 2016-08-30 NOTE — Assessment & Plan Note (Signed)
The size of the tumor is shrinking Continue close observation

## 2016-08-30 NOTE — Assessment & Plan Note (Signed)
He has completed all the treatment and is still experiencing side effects of treatment but overall less I will continue to see him on a weekly basis for supportive care The patient has difficulties with dysarthria I will try to get speech and language therapist to follow

## 2016-08-30 NOTE — Progress Notes (Signed)
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 LEFT neck lymphadenopathy including LEFT level IIa to level 3 4.7 x 4.6 x 7.6 cm necrotic nodal conglomeration with suspected extracapsular spread of disease. Moderately narrowed hypopharynx.       05/18/2016 Procedure    Technically successful ultrasound-guided core biopsy of left cervical adenopathy.       05/18/2016 Pathology Results    Lymph node, needle/core biopsy, L cerv LAN - SQUAMOUS CELL CARCINOMA, SEE COMMENT. Microscopic Comment Residual lymph node is not seen. There is tumor invading skeletal muscle. p16 weakly positive      06/06/2016 Procedure    He had dental extraction      06/08/2016 PET scan    1. 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 2. Hypermetabolic right lower lobe pulmonary nodule is favored to represent a synchronous primary bronchogenic neoplasm. Isolated pulmonary metastasis could look similar. No thoracic nodal hypermetabolism. 3. Soft tissue hypermetabolism about the right iliac bone and greater trochanter of the proximal right femur are favored to be degenerative or posttraumatic. Soft tissue metastasis felt highly unlikely. 4.  Coronary artery atherosclerosis.  Aortic atherosclerosis. 5. A tiny right lower lobe pulmonary nodule is most likely a subpleural lymph node.      06/15/2016 Procedure    He has near normal baseline hearing test      06/23/2016 Procedure    Ultrasound and fluoroscopically guided right internal jugular single lumen power port catheter insertion. Tip in the SVC/RA junction. Catheter ready for use      06/23/2016 Procedure    Fluoroscopic insertion of a 20-French "pull-through" gastrostomy.      06/28/2016 - 08/17/2016 Radiation Therapy    He received radiation      06/30/2016 - 08/11/2016 Chemotherapy    He received chemotherapy with high dose cisplatin       INTERVAL HISTORY: Please see below for problem oriented charting. He returns for further follow-up He is attempting to eat some soft diet He has not lost any weight He had one episode of vomiting, resolved Pain is under control He continues to have significant dysarthria and dysphagia   REVIEW OF SYSTEMS:   Constitutional: Denies fevers, chills or abnormal weight loss Eyes: Denies blurriness of vision Respiratory: Denies cough, dyspnea or wheezes Cardiovascular: Denies palpitation, chest discomfort or lower extremity swelling Skin: Denies abnormal skin rashes Lymphatics: Denies new lymphadenopathy or easy bruising Neurological:Denies numbness, tingling or new weaknesses Behavioral/Psych: Mood is stable, no new changes  All other systems were reviewed with the patient and are negative.  I have reviewed the past medical history, past surgical history, social history and family history with the patient and they are unchanged from previous note.  ALLERGIES:  has No Known Allergies.  MEDICATIONS:  Current Outpatient Prescriptions  Medication Sig Dispense Refill  . cyclobenzaprine (FLEXERIL) 10 MG  tablet Take 1 tablet (10 mg total) by mouth at bedtime. 30 tablet 1  . EQ ALLERGY RELIEF 10 MG tablet TAKE ONE TABLET BY MOUTH ONCE DAILY 90 tablet 3  .  gabapentin (NEURONTIN) 300 MG capsule Take 1 capsule (300 mg total) by mouth 3 (three) times daily. 90 capsule 3  . lidocaine-prilocaine (EMLA) cream Apply to affected area once 30 g 3  . morphine (MSIR) 30 MG tablet Take 1 tablet (30 mg total) by mouth every 6 (six) hours as needed for severe pain. 60 tablet 0  . Nutritional Supplements (FEEDING SUPPLEMENT, OSMOLITE 1.5 CAL,) LIQD Begin Osmolite 1.5 via PEG TID with 60 mL free water. Advance as tolerated to goal of 1 1/2 bottles QID with 60 mL free water before and after. In addition, drink or flush PEG with 240 mL free water TID. 6 Bottle 11  . ondansetron (ZOFRAN) 8 MG tablet Take 1 tablet (8 mg total) by mouth 2 (two) times daily as needed. Start on the third day after chemotherapy. 30 tablet 1  . prochlorperazine (COMPAZINE) 10 MG tablet Take 1 tablet (10 mg total) by mouth every 6 (six) hours as needed (Nausea or vomiting). 30 tablet 1  . senna-docusate (SENOKOT-S) 8.6-50 MG tablet Take 1 tablet by mouth 2 (two) times daily. 60 tablet 3   No current facility-administered medications for this visit.     PHYSICAL EXAMINATION: ECOG PERFORMANCE STATUS: 1 - Symptomatic but completely ambulatory  Vitals:   08/30/16 1351  BP: 118/60  Pulse: 89  Resp: 18  Temp: 98.8 F (37.1 C)   Filed Weights   08/30/16 1351  Weight: 140 lb 9.6 oz (63.8 kg)    GENERAL:alert, no distress and comfortable SKIN: skin color, texture, turgor are normal, no rashes or significant lesions EYES: normal, Conjunctiva are pink and non-injected, sclera clear OROPHARYNX:no exudate, no erythema and lips, buccal mucosa, and tongue normal  NECK: supple, thyroid normal size, non-tender, without nodularity LYMPH: Neck mass is shrinking. LUNGS: clear to auscultation and percussion with normal breathing effort HEART: regular rate & rhythm and no murmurs and no lower extremity edema ABDOMEN:abdomen soft, non-tender and normal bowel sounds Musculoskeletal:no cyanosis of  digits and no clubbing  NEURO: alert & oriented x 3 with significant dysarthria, no focal motor/sensory deficits  LABORATORY DATA:  I have reviewed the data as listed    Component Value Date/Time   NA 135 (L) 08/30/2016 1342   K 3.5 08/30/2016 1342   CL 97 (L) 06/23/2016 0954   CO2 34 (H) 08/30/2016 1342   GLUCOSE 129 08/30/2016 1342   BUN 15.8 08/30/2016 1342   CREATININE 1.3 08/30/2016 1342   CALCIUM 9.8 08/30/2016 1342   PROT 6.6 08/30/2016 1342   ALBUMIN 3.2 (L) 08/30/2016 1342   AST 12 08/30/2016 1342   ALT 8 08/30/2016 1342   ALKPHOS 55 08/30/2016 1342   BILITOT 0.32 08/30/2016 1342   GFRNONAA >60 06/23/2016 0954   GFRAA >60 06/23/2016 0954    No results found for: SPEP, UPEP  Lab Results  Component Value Date   WBC 4.3 08/30/2016   NEUTROABS 3.4 08/30/2016   HGB 9.2 (L) 08/30/2016   HCT 27.3 (L) 08/30/2016   MCV 80.7 08/30/2016   PLT 242 08/30/2016      Chemistry      Component Value Date/Time   NA 135 (L) 08/30/2016 1342   K 3.5 08/30/2016 1342   CL 97 (L) 06/23/2016 0954   CO2 34 (H) 08/30/2016 1342  BUN 15.8 08/30/2016 1342   CREATININE 1.3 08/30/2016 1342      Component Value Date/Time   CALCIUM 9.8 08/30/2016 1342   ALKPHOS 55 08/30/2016 1342   AST 12 08/30/2016 1342   ALT 8 08/30/2016 1342   BILITOT 0.32 08/30/2016 1342      ASSESSMENT & PLAN:  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[place]) He has completed all the treatment and is still experiencing side effects of treatment but overall less I will continue to see him on a weekly basis for supportive care The patient has difficulties with dysarthria I will try to get speech and language therapist to follow  Cancer associated pain He had recently poorly controlled pain IR morphine was increased to 30 mg and he felt it is controlling his pain better We will continue the same I have recently refilled his prescription and will continue to assess on the weekly basis  Dysarthria This is multifactorial,  combination related to pain and discomfort He will continue close follow-up with speech and language therapist  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 The patient denies recent history of bleeding such as epistaxis, hematuria or hematochezia. He is asymptomatic from the anemia. We will observe for now.     No orders of the defined types were placed in this encounter.  All questions were answered. The patient knows to call the clinic with any problems, questions or concerns. No barriers to learning was detected. I spent 15 minutes counseling the patient face to face. The total time spent in the appointment was 20 minutes and more than 50% was on counseling and review of test results     [name], MD 08/30/2016 5:20 PM

## 2016-08-30 NOTE — Assessment & Plan Note (Signed)
This is multifactorial, combination related to pain and discomfort He will continue close follow-up with speech and language therapist

## 2016-08-30 NOTE — Assessment & Plan Note (Signed)
He had recently poorly controlled pain IR morphine was increased to 30 mg and he felt it is controlling his pain better We will continue the same I have recently refilled his prescription and will continue to assess on the weekly basis

## 2016-08-30 NOTE — Progress Notes (Signed)
I received a call from nursing the patient was out of his tube feeding.  Chart was reviewed. Noted patient has had weight loss. Current weight documented as 140.6 pounds on July 18, down from 147.6 pounds June 21. I spoke with patient he reports he uses 2 bottles of Osmolite 1.5, 3 times a day with 60 mL free water before and after bolus feeding. Patient states he drinks a lot of water. 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 I provided patient with a third of a case of Osmolite 1.5 which he can carry with him on the bus. I called [facility] to reorder his tube feeding which should be delivered to his home. Enforced the importance of patient continuing tube feeding at goal rate and drinking at least 24 ounces of water daily, in addition to free water flushes. Patient reports understanding. Will continue to monitor as needed.

## 2016-08-30 NOTE — Assessment & Plan Note (Signed)
This is likely anemia of chronic disease. The patient denies recent history of bleeding such as epistaxis, hematuria or hematochezia. He is asymptomatic from the anemia. We will observe for now.  

## 2016-09-06 DIAGNOSIS — R1310 Dysphagia, unspecified: Secondary | ICD-10-CM | POA: Diagnosis not present

## 2016-09-06 DIAGNOSIS — Z923 Personal history of irradiation: Secondary | ICD-10-CM | POA: Diagnosis not present

## 2016-09-06 DIAGNOSIS — Z931 Gastrostomy status: Secondary | ICD-10-CM | POA: Diagnosis not present

## 2016-09-06 DIAGNOSIS — Z8521 Personal history of malignant neoplasm of larynx: Secondary | ICD-10-CM | POA: Diagnosis not present

## 2016-09-15 ENCOUNTER — Other Ambulatory Visit: Payer: Self-pay | Admitting: Hematology and Oncology

## 2016-09-15 ENCOUNTER — Encounter: Payer: Self-pay | Admitting: Hematology and Oncology

## 2016-09-15 ENCOUNTER — Other Ambulatory Visit (HOSPITAL_BASED_OUTPATIENT_CLINIC_OR_DEPARTMENT_OTHER): Payer: Medicare Other

## 2016-09-15 ENCOUNTER — Telehealth: Payer: Self-pay | Admitting: Hematology and Oncology

## 2016-09-15 ENCOUNTER — Encounter: Payer: Self-pay | Admitting: Radiation Oncology

## 2016-09-15 ENCOUNTER — Ambulatory Visit (HOSPITAL_BASED_OUTPATIENT_CLINIC_OR_DEPARTMENT_OTHER): Payer: Medicare Other | Admitting: Hematology and Oncology

## 2016-09-15 DIAGNOSIS — I890 Lymphedema, not elsewhere classified: Secondary | ICD-10-CM | POA: Diagnosis not present

## 2016-09-15 DIAGNOSIS — D638 Anemia in other chronic diseases classified elsewhere: Secondary | ICD-10-CM | POA: Diagnosis not present

## 2016-09-15 DIAGNOSIS — C321 Malignant neoplasm of supraglottis: Secondary | ICD-10-CM

## 2016-09-15 DIAGNOSIS — R471 Dysarthria and anarthria: Secondary | ICD-10-CM | POA: Diagnosis not present

## 2016-09-15 DIAGNOSIS — G893 Neoplasm related pain (acute) (chronic): Secondary | ICD-10-CM | POA: Diagnosis not present

## 2016-09-15 LAB — CBC WITH DIFFERENTIAL/PLATELET
BASO%: 0.5 % (ref 0.0–2.0)
Basophils Absolute: 0 10*3/uL (ref 0.0–0.1)
EOS%: 1.1 % (ref 0.0–7.0)
Eosinophils Absolute: 0 10*3/uL (ref 0.0–0.5)
HCT: 25.9 % — ABNORMAL LOW (ref 38.4–49.9)
HGB: 8.8 g/dL — ABNORMAL LOW (ref 13.0–17.1)
LYMPH#: 0.3 10*3/uL — AB (ref 0.9–3.3)
LYMPH%: 6 % — AB (ref 14.0–49.0)
MCH: 27.8 pg (ref 27.2–33.4)
MCHC: 33.9 g/dL (ref 32.0–36.0)
MCV: 82 fL (ref 79.3–98.0)
MONO#: 0.7 10*3/uL (ref 0.1–0.9)
MONO%: 16.4 % — ABNORMAL HIGH (ref 0.0–14.0)
NEUT%: 76 % — ABNORMAL HIGH (ref 39.0–75.0)
NEUTROS ABS: 3.2 10*3/uL (ref 1.5–6.5)
PLATELETS: 304 10*3/uL (ref 140–400)
RBC: 3.16 10*6/uL — AB (ref 4.20–5.82)
RDW: 19.9 % — AB (ref 11.0–14.6)
WBC: 4.2 10*3/uL (ref 4.0–10.3)

## 2016-09-15 LAB — COMPREHENSIVE METABOLIC PANEL
ALBUMIN: 3.2 g/dL — AB (ref 3.5–5.0)
ALK PHOS: 56 U/L (ref 40–150)
ALT: 7 U/L (ref 0–55)
AST: 13 U/L (ref 5–34)
Anion Gap: 8 mEq/L (ref 3–11)
BUN: 5.6 mg/dL — ABNORMAL LOW (ref 7.0–26.0)
CO2: 26 mEq/L (ref 22–29)
Calcium: 9.2 mg/dL (ref 8.4–10.4)
Chloride: 105 mEq/L (ref 98–109)
Creatinine: 1.1 mg/dL (ref 0.7–1.3)
EGFR: 87 mL/min/{1.73_m2} — AB (ref >90)
GLUCOSE: 87 mg/dL (ref 70–140)
POTASSIUM: 3.8 meq/L (ref 3.5–5.1)
SODIUM: 139 meq/L (ref 136–145)
Total Bilirubin: 0.56 mg/dL (ref 0.20–1.20)
Total Protein: 6.1 g/dL — ABNORMAL LOW (ref 6.4–8.3)

## 2016-09-15 LAB — MAGNESIUM: Magnesium: 2 mg/dl (ref 1.5–2.5)

## 2016-09-15 MED ORDER — MORPHINE SULFATE 15 MG PO TABS: 15.0000 mg | ORAL_TABLET | Freq: Four times a day (QID) | ORAL | 0 refills | Status: DC | PRN

## 2016-09-15 NOTE — Telephone Encounter (Signed)
Scheduled appt per 8/3 los - Gave patient AVS and calender per los.

## 2016-09-15 NOTE — Assessment & Plan Note (Signed)
This is likely anemia of chronic disease. The patient denies recent history of bleeding such as epistaxis, hematuria or hematochezia. He is asymptomatic from the anemia. We will observe for now.  

## 2016-09-15 NOTE — Assessment & Plan Note (Signed)
He is doing better He rarely uses pain medicine He is able to swallow most food without difficulties He has not lost any weight I recommend removal of feeding tube I plan to continue close follow-up for supportive care

## 2016-09-15 NOTE — Assessment & Plan Note (Signed)
He has significant acquired lymphedema I will refer him back to physical therapist for neck exercises

## 2016-09-15 NOTE — Progress Notes (Signed)
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 LEFT neck lymphadenopathy including LEFT level IIa to level 3 4.7 x 4.6 x 7.6 cm necrotic nodal conglomeration with suspected extracapsular spread of disease. Moderately narrowed hypopharynx.       05/18/2016 Procedure    Technically successful ultrasound-guided core biopsy of left cervical adenopathy.       05/18/2016 Pathology Results    Lymph node, needle/core biopsy, L cerv LAN - SQUAMOUS CELL CARCINOMA, SEE COMMENT. Microscopic Comment Residual lymph node is not seen. There is tumor invading skeletal muscle. p16 weakly positive      06/06/2016 Procedure    He had dental extraction      06/08/2016 PET scan    1. 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 2. Hypermetabolic right lower lobe pulmonary nodule is favored to represent a synchronous primary bronchogenic neoplasm. Isolated pulmonary metastasis could look similar. No thoracic nodal hypermetabolism. 3. Soft tissue hypermetabolism about the right iliac bone and greater trochanter of the proximal right femur are favored to be degenerative or posttraumatic. Soft tissue metastasis felt highly unlikely. 4.  Coronary artery atherosclerosis.  Aortic atherosclerosis. 5. A tiny right lower lobe pulmonary nodule is most likely a subpleural lymph node.      06/15/2016 Procedure    He has near normal baseline hearing test      06/23/2016 Procedure    Ultrasound and fluoroscopically guided right internal jugular single lumen power port catheter insertion. Tip in the SVC/RA junction. Catheter ready for use      06/23/2016 Procedure    Fluoroscopic insertion of a 20-French "pull-through" gastrostomy.      06/28/2016 - 08/17/2016 Radiation Therapy    He received radiation      06/30/2016 - 08/11/2016 Chemotherapy    He received chemotherapy with high dose cisplatin       INTERVAL HISTORY: Please see below for problem oriented charting. He returns for further follow-up He is doing very well He has not use his feeding tube for almost 10 days He is gaining weight He has minimum pain He rarely uses pain medicine He denies recent nausea or vomiting No new lymphadenopathy   REVIEW OF SYSTEMS:   Constitutional: Denies fevers, chills or abnormal weight loss Eyes: Denies blurriness of vision Ears, nose, mouth, throat, and face: Denies mucositis or sore throat Respiratory: Denies cough, dyspnea or wheezes Cardiovascular: Denies palpitation, chest discomfort or lower extremity swelling Gastrointestinal:  Denies nausea, heartburn or change in bowel habits Skin: Denies abnormal skin rashes Lymphatics: Denies new lymphadenopathy or easy bruising Neurological:Denies numbness, tingling or new weaknesses Behavioral/Psych: Mood is stable, no new changes  All other systems were reviewed with the patient and are negative.  I have reviewed the past medical history, past surgical history, social history and family history with the patient and they are unchanged from  previous note.  ALLERGIES:  has No Known Allergies.  MEDICATIONS:  Current Outpatient Prescriptions  Medication Sig Dispense Refill  . cyclobenzaprine (FLEXERIL) 10 MG tablet  Take 1 tablet (10 mg total) by mouth at bedtime. 30 tablet 1  . EQ ALLERGY RELIEF 10 MG tablet TAKE ONE TABLET BY MOUTH ONCE DAILY 90 tablet 3  . gabapentin (NEURONTIN) 300 MG capsule Take 1 capsule (300 mg total) by mouth 3 (three) times daily. 90 capsule 3  . lidocaine-prilocaine (EMLA) cream Apply to affected area once 30 g 3  . morphine (MSIR) 15 MG tablet Take 1 tablet (15 mg total) by mouth every 6 (six) hours as needed for severe pain. 60 tablet 0  . Nutritional Supplements (FEEDING SUPPLEMENT, OSMOLITE 1.5 CAL,) LIQD Begin Osmolite 1.5 via PEG TID with 60 mL free water. Advance as tolerated to goal of 1 1/2 bottles QID with 60 mL free water before and after. In addition, drink or flush PEG with 240 mL free water TID. 6 Bottle 11  . ondansetron (ZOFRAN) 8 MG tablet Take 1 tablet (8 mg total) by mouth 2 (two) times daily as needed. Start on the third day after chemotherapy. 30 tablet 1  . prochlorperazine (COMPAZINE) 10 MG tablet Take 1 tablet (10 mg total) by mouth every 6 (six) hours as needed (Nausea or vomiting). 30 tablet 1  . senna-docusate (SENOKOT-S) 8.6-50 MG tablet Take 1 tablet by mouth 2 (two) times daily. 60 tablet 3   No current facility-administered medications for this visit.     PHYSICAL EXAMINATION: ECOG PERFORMANCE STATUS: 1 - Symptomatic but completely ambulatory  Vitals:   09/15/16 1238  BP: 126/79  Pulse: 83  Resp: 16  Temp: 98 F (36.7 C)   Filed Weights   09/15/16 1238  Weight: 142 lb 3.2 oz (64.5 kg)    GENERAL:alert, no distress and comfortable SKIN: skin color, texture, turgor are normal, no rashes or significant lesions EYES: normal, Conjunctiva are pink and non-injected, sclera clear OROPHARYNX:no exudate, no erythema and lips, buccal mucosa, and tongue normal  NECK: supple, thyroid normal size, non-tender, without nodularity LYMPH: Previously palpable lymphadenopathy has regressed in size LUNGS: clear to auscultation and percussion with normal  breathing effort HEART: regular rate & rhythm and no murmurs and no lower extremity edema ABDOMEN:abdomen soft, non-tender and normal bowel sounds Musculoskeletal:no cyanosis of digits and no clubbing  NEURO: alert & oriented x 3 with significant dysarthria, no focal motor/sensory deficits  LABORATORY DATA:  I have reviewed the data as listed    Component Value Date/Time   NA 139 09/15/2016 1213   K 3.8 09/15/2016 1213   CL 97 (L) 06/23/2016 0954   CO2 26 09/15/2016 1213   GLUCOSE 87 09/15/2016 1213   BUN 5.6 (L) 09/15/2016 1213   CREATININE 1.1 09/15/2016 1213   CALCIUM 9.2 09/15/2016 1213   PROT 6.1 (L) 09/15/2016 1213   ALBUMIN 3.2 (L) 09/15/2016 1213   AST 13 09/15/2016 1213   ALT 7 09/15/2016 1213   ALKPHOS 56 09/15/2016 1213   BILITOT 0.56 09/15/2016 1213   GFRNONAA >60 06/23/2016 0954   GFRAA >60 06/23/2016 0954    No results found for: SPEP, UPEP  Lab Results  Component Value Date   WBC 4.2 09/15/2016   NEUTROABS 3.2 09/15/2016   HGB 8.8 (L) 09/15/2016   HCT 25.9 (L) 09/15/2016   MCV 82.0 09/15/2016   PLT 304 09/15/2016      Chemistry      Component Value  Date/Time   NA 139 09/15/2016 1213   K 3.8 09/15/2016 1213   CL 97 (L) 06/23/2016 0954   CO2 26 09/15/2016 1213   BUN 5.6 (L) 09/15/2016 1213   CREATININE 1.1 09/15/2016 1213      Component Value Date/Time   CALCIUM 9.2 09/15/2016 1213   ALKPHOS 56 09/15/2016 1213   AST 13 09/15/2016 1213   ALT 7 09/15/2016 1213   BILITOT 0.56 09/15/2016 1213       ASSESSMENT & PLAN:  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 The patient denies recent history of bleeding such as epistaxis, hematuria or hematochezia. He is asymptomatic  from the anemia. We will observe for now.    Cancer associated pain He has not used much pain medicine lately He will continue gabapentin I plan to reduce the dose of morphine sulfate I will reevaluate when I see him back in the next visit with plan for future taper  Dysarthria He has significant dysarthria He needs to follow-up closely with speech and language therapist  Acquired lymphedema He has significant acquired lymphedema I will refer him back to physical therapist for neck exercises   Orders Placed This Encounter  Procedures  . Ambulatory Referral to Physical Therapy    Referral Priority:   Routine    Referral Type:   Physical Medicine    Referral Reason:   Specialty Services Required    Requested Specialty:   Physical Therapy    Number of Visits Requested:   1  . Ambulatory Referral to Speech Therapy  (specifically to Garald Balding)    Referral Priority:   Routine    Referral Type:   Speech Therapy    Referral Reason:   Specialty Services Required    Requested Specialty:   Speech Pathology    Number of Visits Requested:   1   All questions were answered. The patient knows to call the clinic with any problems, questions or concerns. No barriers to learning was detected. I spent 25 minutes counseling the patient face to face. The total time spent in the appointment was 30 minutes and more than 50% was on counseling and review of test results     [name], MD 09/15/2016 5:30 PM

## 2016-09-15 NOTE — Progress Notes (Signed)
  Mr. Hamberger presents for follow up of radiation completed 08/17/16 to his Larynx.   Pain issues, if any: He reports mild pain to his left neck, jaw area. He is taking MSIR 15 mg 2-3 times daily for pain. Using a feeding tube?: He has not used his feeding tube in several weeks. He tells me that [name] recommended he not use it. He has an appointment with Dory Peru RD 09/26/16 Weight changes, if any:  Wt Readings from Last 3 Encounters:  09/22/16 135 lb 12.8 oz (61.6 kg)  09/15/16 142 lb 3.2 oz (64.5 kg)  08/30/16 140 lb 9.6 oz (63.8 kg)   Swallowing issues, if any: He tells me food will get stuck in his throat. He is only eating soft foods such as grits. He is not drinking ensure of boost Smoking or chewing tobacco? No Using fluoride trays daily? N/A Last ENT visit was on: [name] 1/44/81. He will see him again 10/23/16 Other notable issues, if any:  His neck has healed with no open areas present.  [name] 09/15/16 with his next appointment 11/06/16  BP 138/90   Pulse 77   Temp 98.8 F (37.1 C)   Ht 5\' 3"  (1.6 m)   Wt 135 lb 12.8 oz (61.6 kg)   SpO2 100% Comment: room air  BMI 24.06 kg/m

## 2016-09-15 NOTE — Assessment & Plan Note (Signed)
He has significant dysarthria He needs to follow-up closely with speech and language therapist

## 2016-09-15 NOTE — Assessment & Plan Note (Signed)
He has not used much pain medicine lately He will continue gabapentin I plan to reduce the dose of morphine sulfate I will reevaluate when I see him back in the next visit with plan for future taper

## 2016-09-18 MED FILL — MORPHINE SULFATE IR 15 MG T: 15 | 15 days supply | Qty: 60 | Fill #0

## 2016-09-21 ENCOUNTER — Telehealth: Payer: Self-pay | Admitting: *Deleted

## 2016-09-21 ENCOUNTER — Encounter: Payer: Self-pay | Admitting: *Deleted

## 2016-09-21 NOTE — Telephone Encounter (Signed)
Oncology Nurse Navigator Documentation  Spoke with Antonio [name], he agreed to attend H&N [facility] 8/14 for follow-up with SLP, PT and Nutrition.  He will arrive around 8:30 as he will be taking the bus.  [name], RN, BSN, [place] Neck Oncology Nurse [place] at [place] [phone]

## 2016-09-22 ENCOUNTER — Encounter: Payer: Self-pay | Admitting: Radiation Oncology

## 2016-09-22 ENCOUNTER — Ambulatory Visit
Admission: RE | Admit: 2016-09-22 | Discharge: 2016-09-22 | Disposition: A | Discharge status: Home / Self Care | Payer: Medicare Other | Source: Ambulatory Visit | Attending: Radiation Oncology | Admitting: Radiation Oncology

## 2016-09-22 ENCOUNTER — Ambulatory Visit (HOSPITAL_BASED_OUTPATIENT_CLINIC_OR_DEPARTMENT_OTHER): Payer: Medicare Other

## 2016-09-22 VITALS — BP 138/90 | HR 77 | Temp 98.8°F | Ht 63.0 in | Wt 135.8 lb

## 2016-09-22 VITALS — BP 138/67 | HR 84 | Temp 98.2°F | Resp 20

## 2016-09-22 DIAGNOSIS — K219 Gastro-esophageal reflux disease without esophagitis: Secondary | ICD-10-CM | POA: Insufficient documentation

## 2016-09-22 DIAGNOSIS — I10 Essential (primary) hypertension: Secondary | ICD-10-CM | POA: Insufficient documentation

## 2016-09-22 DIAGNOSIS — D573 Sickle-cell trait: Secondary | ICD-10-CM | POA: Diagnosis not present

## 2016-09-22 DIAGNOSIS — C3431 Malignant neoplasm of lower lobe, right bronchus or lung: Secondary | ICD-10-CM | POA: Insufficient documentation

## 2016-09-22 DIAGNOSIS — K5900 Constipation, unspecified: Secondary | ICD-10-CM | POA: Diagnosis not present

## 2016-09-22 DIAGNOSIS — Z452 Encounter for adjustment and management of vascular access device: Secondary | ICD-10-CM | POA: Diagnosis not present

## 2016-09-22 DIAGNOSIS — F17210 Nicotine dependence, cigarettes, uncomplicated: Secondary | ICD-10-CM | POA: Insufficient documentation

## 2016-09-22 DIAGNOSIS — C321 Malignant neoplasm of supraglottis: Secondary | ICD-10-CM

## 2016-09-22 DIAGNOSIS — C770 Secondary and unspecified malignant neoplasm of lymph nodes of head, face and neck: Secondary | ICD-10-CM | POA: Diagnosis not present

## 2016-09-22 DIAGNOSIS — Z510 Encounter for antineoplastic radiation therapy: Secondary | ICD-10-CM | POA: Diagnosis not present

## 2016-09-22 DIAGNOSIS — Z95828 Presence of other vascular implants and grafts: Secondary | ICD-10-CM

## 2016-09-22 DIAGNOSIS — E7800 Pure hypercholesterolemia, unspecified: Secondary | ICD-10-CM | POA: Insufficient documentation

## 2016-09-22 HISTORY — DX: Personal history of irradiation: Z92.3

## 2016-09-22 MED ORDER — SODIUM CHLORIDE 0.9% FLUSH
10.0000 mL | INTRAVENOUS | Status: DC | PRN
  Administered 2016-09-22: 10 mL via INTRAVENOUS
  Filled 2016-09-22: qty 10

## 2016-09-22 MED ORDER — HEPARIN SOD (PORK) LOCK FLUSH 100 UNIT/ML IV SOLN
500.0000 [IU] | Freq: Once | INTRAVENOUS | Status: AC
  Administered 2016-09-22: 500 [IU] via INTRAVENOUS
  Filled 2016-09-22: qty 5

## 2016-09-22 NOTE — Progress Notes (Signed)
Radiation Oncology         [phone] ________________________________  Name: Antonio [name] MRN: [id number]  Date: 09/22/2016  DOB: 1955/05/27  Follow-Up Visit Note  CC: Maryellen Pile, MD  [name], MD  Diagnosis and Prior Radiotherapy:       ICD-10-CM   1. 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 Patient completed treatment on 08/17/16.   Of note, [name] saw him on 09/15/16 and referred the pt to PT for lymphedema of the neck. [name] adjusted the pt pain medications and recommended feeding tube removal, however today his weight has declined and he reports food gets stuff in his throat. Pt is not drinking supplements. He reports that he was constipated for approximately 1 week and had a bowel movement 2 days ago.                 ALLERGIES:  has No Known Allergies.  Meds: Current Outpatient Prescriptions  Medication Sig Dispense Refill  . cyclobenzaprine (FLEXERIL) 10 MG tablet Take 1 tablet (10 mg total) by mouth at bedtime. 30 tablet 1  . EQ ALLERGY RELIEF 10 MG tablet TAKE ONE TABLET BY MOUTH ONCE DAILY 90 tablet 3  . gabapentin (NEURONTIN) 300 MG capsule Take 1 capsule (300 mg total) by mouth 3 (three) times daily. 90 capsule 3  . lidocaine-prilocaine (EMLA) cream Apply to affected area once 30 g 3  . morphine (MSIR) 15 MG tablet Take 1 tablet (15 mg total) by mouth every 6 (six) hours as needed for severe pain. 60 tablet 0  . Nutritional Supplements (FEEDING SUPPLEMENT, OSMOLITE 1.5 CAL,) LIQD Begin Osmolite 1.5 via PEG TID with 60 mL free water. Advance as tolerated to goal of 1 1/2 bottles QID with 60 mL free water before and after. In addition, drink or flush PEG with 240 mL free water TID. 6 Bottle 11  . ondansetron (ZOFRAN) 8 MG tablet Take 1 tablet (8 mg total) by mouth 2 (two) times daily as needed. Start on the third day  after chemotherapy. 30 tablet 1  . prochlorperazine (COMPAZINE) 10 MG tablet Take 1 tablet (10 mg total) by mouth every 6 (six) hours as needed (Nausea or vomiting). 30 tablet 1  . senna-docusate (SENOKOT-S) 8.6-50 MG tablet Take 1 tablet by mouth 2 (two) times daily. 60 tablet 3   No current facility-administered medications for this encounter.     Physical Findings: The patient is in no acute distress. Patient is alert and oriented. Wt Readings from Last 3 Encounters:  09/22/16 135 lb 12.8 oz (61.6 kg)  09/15/16 142 lb 3.2 oz (64.5 kg)  08/30/16 140 lb 9.6 oz (63.8 kg)    height is 5\' 3"  (1.6 m) and weight is 135 lb 12.8 oz (61.6 kg). His temperature is 98.8 F (37.1 C). His blood pressure is 138/90 and his pulse is 77. His oxygen saturation is 100%. .  General: Alert and oriented, in no acute distress HEENT: Head is normocephalic. Extraocular movements are intact. Oropharynx is notable for Tongue more raised on right than left. No sign of tumor in oropharynx. Mucosa is healing well.  Neck: Neck is notable for lymphedema in anterior neck. No palpable tumor in neck in cervical or supraclavicular regions.  Skin: Skin in treatment fields shows satisfactory healing Lymphatics: see Neck Exam Psychiatric: Judgment and insight are intact. Affect is  appropriate.   Lab Findings: Lab Results  Component Value Date   WBC 4.2 09/15/2016   HGB 8.8 (L) 09/15/2016   HCT 25.9 (L) 09/15/2016   MCV 82.0 09/15/2016   PLT 304 09/15/2016    Lab Results  Component Value Date   TSH 0.091 (L) 07/24/2016    Radiographic Findings: No results found.  Impression/Plan:    1) Head and Neck Cancer Status: Healing from radiotherapy and chemotherapy.    2) Nutritional Status: I asked [name], RN, our Head and Neck Oncology Navigator to cancel feeding tube removal due to weight loss. Advised patient to consume ensure and boost PO. Pt advised to keep appointment with Nutritionist, Dory Peru on  09/26/16.  PEG tube: In place, patient not currently using.  Push intake po, and by PEG prn  3) Risk Factors: The patient has been educated about risk factors including alcohol and tobacco abuse; they understand that avoidance of alcohol and tobacco is important to prevent recurrences as well as other cancers. Pt reports that he has stopped smoking at this time. Pt voices that he intermittently consumes beer, and was advised to cut back on ETOH use.   4) Swallowing: Pt is only eating soft foods such as grits. He hasn't consumed other foods due to them getting stuck in his throat. Pt is not drinking ensure or boost.  [place] followup scheduled  5) Dental: Encouraged to continue regular followup with dentistry, and dental hygiene including fluoride rinses.  6) Thyroid function: check again in 2 months.  Lab Results  Component Value Date   TSH 0.091 (L) 07/24/2016    7) I will order PET scan in 2 months and pt will follow-up in me in 2 months following the PET scan. The patient was encouraged to call with any issues or questions before then.       [name], MD   This document serves as a record of services personally performed by [name], MD. It was created on her behalf by Steva Colder, a trained medical scribe. The creation of this record is based on the scribe's personal observations and the provider's statements to them. This document has been checked and approved by the attending provider.

## 2016-09-22 NOTE — Patient Instructions (Signed)
Implanted Port Home Guide An implanted port is a type of central line that is placed under the skin. Central lines are used to provide IV access when treatment or nutrition needs to be given through a person's veins. Implanted ports are used for long-term IV access. An implanted port may be placed because:  You need IV medicine that would be irritating to the small veins in your hands or arms.  You need long-term IV medicines, such as antibiotics.  You need IV nutrition for a long period.  You need frequent blood draws for lab tests.  You need dialysis.  Implanted ports are usually placed in the chest area, but they can also be placed in the upper arm, the abdomen, or the leg. An implanted port has two main parts:  Reservoir. The reservoir is round and will appear as a small, raised area under your skin. The reservoir is the part where a needle is inserted to give medicines or draw blood.  Catheter. The catheter is a thin, flexible tube that extends from the reservoir. The catheter is placed into a large vein. Medicine that is inserted into the reservoir goes into the catheter and then into the vein.  How will I care for my incision site? Do not get the incision site wet. Bathe or shower as directed by your health care provider. How is my port accessed? Special steps must be taken to access the port:  Before the port is accessed, a numbing cream can be placed on the skin. This helps numb the skin over the port site.  Your health care provider uses a sterile technique to access the port. ? Your health care provider must put on a mask and sterile gloves. ? The skin over your port is cleaned carefully with an antiseptic and allowed to dry. ? The port is gently pinched between sterile gloves, and a needle is inserted into the port.  Only "non-coring" port needles should be used to access the port. Once the port is accessed, a blood return should be checked. This helps ensure that the port  is in the vein and is not clogged.  If your port needs to remain accessed for a constant infusion, a clear (transparent) bandage will be placed over the needle site. The bandage and needle will need to be changed every week, or as directed by your health care provider.  Keep the bandage covering the needle clean and dry. Do not get it wet. Follow your health care provider's instructions on how to take a shower or bath while the port is accessed.  If your port does not need to stay accessed, no bandage is needed over the port.  What is flushing? Flushing helps keep the port from getting clogged. Follow your health care provider's instructions on how and when to flush the port. Ports are usually flushed with saline solution or a medicine called heparin. The need for flushing will depend on how the port is used.  If the port is used for intermittent medicines or blood draws, the port will need to be flushed: ? After medicines have been given. ? After blood has been drawn. ? As part of routine maintenance.  If a constant infusion is running, the port may not need to be flushed.  How long will my port stay implanted? The port can stay in for as long as your health care provider thinks it is needed. When it is time for the port to come out, surgery will be   done to remove it. The procedure is similar to the one performed when the port was put in. When should I seek immediate medical care? When you have an implanted port, you should seek immediate medical care if:  You notice a bad smell coming from the incision site.  You have swelling, redness, or drainage at the incision site.  You have more swelling or pain at the port site or the surrounding area.  You have a fever that is not controlled with medicine.  This information is not intended to replace advice given to you by your health care provider. Make sure you discuss any questions you have with your health care provider. Document  Released: 01/30/2005 Document Revised: 07/08/2015 Document Reviewed: 10/07/2012 Elsevier Interactive Patient Education  2017 [employer]  

## 2016-09-25 ENCOUNTER — Telehealth: Payer: Self-pay | Admitting: *Deleted

## 2016-09-25 NOTE — Telephone Encounter (Signed)
Oncology Nurse Navigator Documentation  Spoke with Antonio [name], reminded him of his attendance at tomorrow morning's H&N [place] with [place] arrival to Radiation Waiting following lobby registration.  He voiced understanding.  [name], RN, BSN, [employer] Neck Oncology Nurse [place] at [place] [phone]

## 2016-09-26 ENCOUNTER — Encounter: Payer: Self-pay | Admitting: *Deleted

## 2016-09-26 ENCOUNTER — Ambulatory Visit: Payer: Medicare Other | Attending: Hematology and Oncology | Admitting: Physical Therapy

## 2016-09-26 ENCOUNTER — Ambulatory Visit
Admission: RE | Admit: 2016-09-26 | Discharge: 2016-09-26 | Disposition: A | Discharge status: Home / Self Care | Payer: Medicare Other | Source: Ambulatory Visit | Attending: Radiation Oncology | Admitting: Radiation Oncology

## 2016-09-26 ENCOUNTER — Ambulatory Visit: Payer: Medicare Other | Admitting: Nutrition

## 2016-09-26 ENCOUNTER — Ambulatory Visit: Payer: Medicare Other

## 2016-09-26 DIAGNOSIS — R29898 Other symptoms and signs involving the musculoskeletal system: Secondary | ICD-10-CM | POA: Insufficient documentation

## 2016-09-26 DIAGNOSIS — R1312 Dysphagia, oropharyngeal phase: Secondary | ICD-10-CM | POA: Diagnosis not present

## 2016-09-26 DIAGNOSIS — R471 Dysarthria and anarthria: Secondary | ICD-10-CM

## 2016-09-26 DIAGNOSIS — R293 Abnormal posture: Secondary | ICD-10-CM | POA: Diagnosis not present

## 2016-09-26 DIAGNOSIS — I890 Lymphedema, not elsewhere classified: Secondary | ICD-10-CM | POA: Diagnosis not present

## 2016-09-26 NOTE — Therapy (Signed)
[place], [place], [address] Phone: [phone]   Fax:  [phone]  Physical Therapy Evaluation  Patient Details  Name: Antonio [name] MRN: [id number] Date of Birth: August 25, 1955 Referring Provider: Dr. [name]  Encounter Date: 09/26/2016      PT End of Session - 09/26/16 1151    Visit Number 1   Number of Visits 5   Date for PT Re-Evaluation 10/27/16   PT Start Time 1020   PT Stop Time 1045   PT Time Calculation (min) 25 min   Activity Tolerance Patient tolerated treatment well   Behavior During Therapy [facility] for tasks assessed/performed      Past Medical History:  Diagnosis Date  . Allergy    allergic rhinitis  . Arthritis    "qwhere" (05/17/2016)  . Chronic cough   . Chronic sinus infection   . Colonic polyp   . COPD (chronic obstructive pulmonary disease) ([place])   . 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 Dysphagia    normal EGD except for mild gastritis in 01/2006  . GERD (gastroesophageal reflux disease)   . Head and neck malignancy ([place]) 05/18/2016  . High cholesterol   . History of radiation therapy 06/28/16- 08/17/16   Larynx 70 Gy in 35 fractions to gross disease, 63 Gy in 35 fractions to high risk nodal echelons, and 56 Gy in 35 fractions to intermediate risk nodal echelons  . History of radiation therapy 06/28/16- 08/17/16   Right Lung 60 Gy in 5 fractions--- given at same time as Larynx radiation  . Hypertension   . Rectal bleeding    colonoscopy in 01/2006  . Rectal polyp    01/2006 with path showing it to be benign, though with a question that it was incomplete biopsy, next scheduled coloscopy 2010  . Sickle cell trait ([place])   . Tobacco abuse   . Varus deformity of knee    bilateral, congenital    Past Surgical History:  Procedure Laterality Date  . COLONOSCOPY W/ BIOPSIES AND POLYPECTOMY  X 2  . IR FLUORO GUIDE PORT INSERTION RIGHT  06/23/2016  . IR  GASTROSTOMY TUBE MOD SED  06/23/2016  . IR US GUIDE VASC ACCESS RIGHT  06/23/2016  . MULTIPLE TOOTH EXTRACTIONS  ~ 2008   "took them all out"    There were no vitals filed for this visit.       Subjective Assessment - 09/26/16 1025    Subjective Neck swelling started beginning of August, a few weeks after treatment finished.   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  Will have concurrent chemoradiation starting 06/27/16.  Current smoker; h/o crack cocaine, marijuana use, ETOH. Finished chemoradiation 08/17/16.   Patient Stated Goals reduce the swelling   Currently in Pain? 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 [name]  Onset Date/Surgical Date 05/17/16   Prior Therapy none     Precautions   Precautions Other (comment)   Precaution Comments cancer precautions; he is a bit difficult to understand     Restrictions   Weight Bearing Restrictions No     Balance Screen   Has the patient fallen in the past 6 months No   Has the patient had a decrease in activity level because of a fear of falling?  No   Is the patient reluctant to leave their home because of a fear of falling?  No     Home Environment   Living Environment Private residence   Living Arrangements Alone   Type of [place] One level     Prior Function   Level of Independence Independent with household mobility with device   Leisure no regular exercise     Cognition    Overall Cognitive Status Within Functional Limits for tasks assessed     Observation/Other Assessments   Observations visible swelling at anterior neck     Posture/Postural Control   Posture/Postural Control Postural limitations   Postural Limitations Rounded Shoulders;Forward head     Palpation   Palpation comment anterior neck area of fullness is largely soft, but with an area of induration at its core or center           LYMPHEDEMA/ONCOLOGY QUESTIONNAIRE - 09/26/16 1028      Type   Cancer Type Lt supraglottic squamous cell carcinoma with bilateral neck adenopathy and right lower lobe nodule seen on PET but not biopsied     Treatment   Past Chemotherapy Treatment Yes   Date 08/17/16   Past Radiation Treatment Yes   Date 08/17/16   Body Site neck     Head and Neck   4 cm superior to sternal notch around neck 38.5 cm   6 cm superior to sternal notch around neck 38.8 cm   8 cm superior to sternal notch around neck 41.4 cm   Other has lost about 30 lbs. during treatment         Objective measurements completed on examination: See above findings.          [facility] [facility] Adult PT Treatment/Exercise - 09/26/16 0001      Self-Care   Self-Care Other Self-Care Comments   Other Self-Care Comments  fashioned foam chip pack in stockinette and placed it on patient for neck compression; instructed patient to use it 2-4 hours a day at first                PT Education - 09/26/16 1150    Education provided Yes   Education Details use of foam chip pack for compression 2-4 waking hours/day; could later try to sleep in it   Person(s) Educated Patient   Methods Explanation;Demonstration   Comprehension Verbalized understanding          PT Short Term Goals - 04/05/16 1349      PT SHORT TERM GOAL #1   Title make referral for orthotist   Time 2   Period Weeks   Status Achieved           PT Long Term Goals - 04/05/16 1349      PT LONG TERM GOAL #1   Title  report pain in the hip and leg decreased 25%   Time 6   Period Weeks   Status New           Long  Term Clinic Goals - 09/26/16 1155      CC Long Term Goal  #1   Title Pt. will be independent in self-manual lymph drainage.   Time 3   Period Weeks   Status New   Target Date 10/20/16     CC Long Term Goal  #2   Title Pt. will be knowledgeable about where and how to obtain manufactured neck compression garment.   Time 3   Period Weeks   Status New   Target Date 10/20/16         Head and Neck Clinic Goals - 06/20/16 1048      Patient will be able to verbalize understanding of a home exercise program for cervical range of motion, posture, and walking.    Status Achieved     Patient will be able to verbalize understanding of proper sitting and standing posture.    Status Achieved     Patient will be able to verbalize understanding of lymphedema risk and availability of treatment for this condition.    Status Achieved            Plan - 09/26/16 1151    Clinical Impression Statement Patient who is now about 6 weeks post-chemoradition for head & neck cancer with visible and palpable swelling at anterior neck.  He has lost about 30 lbs., so neck measurements have decreased compared to pre-treatment measurements, but lymphedema is evident.  He, of course, still has osteoarthritis in knees with need for a walking stick. This is a re-evaluation.   Rehab Potential Good   PT Frequency --  2-4 visits prn   PT Duration 4 weeks   PT Treatment/Interventions Patient/family education   PT Next Visit Plan Check how he did with foam chip pack for compression. Begin manual lymph drainage and instructing patient in same; educate about manufacture neck compression garments, where and how to obtain.   PT Home Exercise Plan neck ROM, walking or other exercise   Consulted and Agree with Plan of Care Patient      Patient will benefit from skilled therapeutic intervention  in order to improve the following deficits and impairments:  Pain, Increased edema, Decreased range of motion, Difficulty walking, Postural dysfunction  Visit Diagnosis: Lymphedema, not elsewhere classified - Plan: PT plan of care cert/re-cert  Abnormal posture - Plan: PT plan of care cert/re-cert      G-Codes - 38/75/64 1155    Functional Assessment Tool Used (Outpatient Only) clinical judegement   Functional Limitation Self care  for lymphedema   Self Care Current Status (P3295) At least 80 percent but less than 100 percent impaired, limited or restricted   Self Care Goal Status (J8841) At least 1 percent but less than 20 percent impaired, limited or restricted       Problem List Patient Active Problem List   Diagnosis Date Noted  . Acquired lymphedema 09/15/2016  . Anemia, chronic disease 08/30/2016  . Prerenal acute renal failure ([place]) 08/26/2016  . Pancytopenia, acquired ([place]) 07/27/2016  . Nausea without vomiting 07/27/2016  . Other constipation 07/20/2016  . 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 Malignant neoplasm of lower lobe of right lung ([place]) 06/28/2016  . Cancer associated pain 05/31/2016  . Dysarthria 05/31/2016  . 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[place]) 05/18/2016  . Sciatic leg pain 02/24/2015  . Preventative health care 12/01/2014  . Hyperlipidemia 02/20/2013  . Varus deformity of knee 06/16/2011  . COLONIC POLYPS 03/14/2006  . Allergic rhinitis  01/19/2006  . COPD 01/19/2006  . Essential hypertension 01/11/2006  . Osteoarthrosis involving lower leg 01/11/2006    SALISBURY,DONNA 09/26/2016, 12:00 PM  [place], [place], [address] Phone: [phone]   Fax:  [phone]  Name: Wardell Pokorski MRN: [id number] Date of Birth: 1955/06/16  Serafina Royals, PT 09/26/16 12:00 PM

## 2016-09-26 NOTE — Progress Notes (Signed)
Nutrition follow-up completed with patient during head and neck clinic. Weight decreased and documented as 135.4 pounds, down from 140.6 pounds July 18. Patient reports he has not received any formula from [facility] for ~ 1 month. Patient denies nausea, vomiting and diarrhea. Patient reports constipation, however, had a bowel movement yesterday. He is able to drink 3 - 20 ounce bottles of water every day. He is able to tolerate soft foods. Patient lives alone and takes the bus for transportation. Radiation was completed on July 5.  Nutrition diagnosis: Unintended weight loss continues.  Intervention: Educated patient to drink 3 bottles ensure Enlive/Plus or use 3 bottles Osmolite 1.5 via feeding tube 3 times a day. Provided samples Ensure Plus and Osmolite 1.5. Provided patient with a food bag consisting of soft foods that patient can tolerate. I placed a call to [facility] to determine why patient has not received tube feeding. I have asked patient to contact his sister to see if she could pick up supplies because patient is not able to transport them on the bus.  Monitoring, evaluation, goals: Patient has decreased oral intake and has had weight loss. Will work to increase patient's oral intake and tube feeding to promote healing and weight gain.  Next visit: To be scheduled as needed.  **Disclaimer: This note was dictated with voice recognition software. Similar sounding words can inadvertently be transcribed and this note may contain transcription errors which may not have been corrected upon publication of note.**

## 2016-09-26 NOTE — Therapy (Signed)
[facility] [address] [place], [place], [address] Phone: [phone]   Fax:  [phone]  Speech Language Pathology Treatment  Patient Details  Name: Antonio [name] MRN: [id number] Date of Birth: 01-Aug-1955 Referring Provider: [name], MD  Encounter Date: 09/26/2016      End of Session - 09/26/16 1147    Visit Number 2   Number of Visits 4   Date for SLP Re-Evaluation 12/08/16   SLP Start Time 1100   SLP Stop Time  1130   SLP Time Calculation (min) 30 min   Activity Tolerance Patient tolerated treatment well      Past Medical History:  Diagnosis Date  . Allergy    allergic rhinitis  . Arthritis    "qwhere" (05/17/2016)  . Chronic cough   . Chronic sinus infection   . Colonic polyp   . COPD (chronic obstructive pulmonary disease) ([place])   . 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 Dysphagia    normal EGD except for mild gastritis in 01/2006  . GERD (gastroesophageal reflux disease)   . Head and neck malignancy ([place]) 05/18/2016  . High cholesterol   . History of radiation therapy 06/28/16- 08/17/16   Larynx 70 Gy in 35 fractions to gross disease, 63 Gy in 35 fractions to high risk nodal echelons, and 56 Gy in 35 fractions to intermediate risk nodal echelons  . History of radiation therapy 06/28/16- 08/17/16   Right Lung 60 Gy in 5 fractions--- given at same time as Larynx radiation  . Hypertension   . Rectal bleeding    colonoscopy in 01/2006  . Rectal polyp    01/2006 with path showing it to be benign, though with a question that it was incomplete biopsy, next scheduled coloscopy 2010  . Sickle cell trait ([place])   . Tobacco abuse   . Varus deformity of knee    bilateral, congenital    Past Surgical History:  Procedure Laterality Date  . COLONOSCOPY W/ BIOPSIES AND POLYPECTOMY  X 2  . IR FLUORO GUIDE PORT INSERTION RIGHT  06/23/2016  . IR GASTROSTOMY TUBE MOD SED  06/23/2016  .  IR US GUIDE VASC ACCESS RIGHT  06/23/2016  . MULTIPLE TOOTH EXTRACTIONS  ~ 2008   "took them all out"    There were no vitals filed for this visit.      Subjective Assessment - 09/26/16 1138    Subjective Pt c/o bread and tough/drier foods "getting hung". SLP explained that 1 month after last rad visit this is WNL.   Currently in Pain? Yes   Pain Score 3    Pain Location Face   Pain Orientation Left   Pain Descriptors / Indicators Aching   Pain Type Acute pain   Aggravating Factors  more painful in AMs   Pain Relieving Factors movement "after I get it moving"               ADULT SLP TREATMENT - 09/26/16 1140      General Information   Behavior/Cognition Alert;Cooperative;Pleasant mood     Treatment Provided   Treatment provided Dysphagia     Dysphagia Treatment   Temperature Spikes Noted No   Respiratory Status Room air   Oral Cavity - Dentition Edentulous   Treatment Methods Skilled observation;Upgraded PO texture trial;Therapeutic exercise;Compensation strategy training;Patient/caregiver education   Patient observed directly with PO's Yes   Type of PO's observed Dysphagia 3 (soft);Thin liquids  Feeding Able to feed self   Liquids provided via Cup   Oral Phase Signs & Symptoms Prolonged mastication  pocketing due to jaw sx   Pharyngeal Phase Signs & Symptoms --  none noted   Other treatment/comments Pt told SLP he had "been doing some exercises" however when SLP asked pt to demonstrate, "exercises" were nothing SLP had shown pt to do. Initial mod A faded to independent with simplified HEP (Masako, Mendelsohn, hard swallow, and Shaker). SLP had pt successfully describe Shaker today, and not demonstrate. Pt ate [place] and drank water. Reported drier and tougher foods are "getting hung" and SLP explained that was WNL. Pt reports puree/dys II and soft foods such as spaghetti are WNL in pharyngeal clearance. Education provided re: needs to eat soft foods for at least  another 4 weeks and then try tougher/drier foods again, and NEEDS TO DO HEP. SLP explained rationale for pt, he voiced understanding.     Assessment / Recommendations / Plan   Plan Continue with current plan of care     Progression Toward Goals   Progression toward goals Progressing toward goals          SLP Education - 09/26/16 1145    Education provided Yes   Education Details HEP (mendelsohn, masako, shaker, effortful swallow), late effects head/neck radiation on swallowing, do not try breads/tough/dry foods until mid-Sept   Person(s) Educated Patient   Methods Explanation;Demonstration;Verbal cues   Comprehension Verbalized understanding;Returned demonstration;Verbal cues required;Need further instruction          SLP Short Term Goals - 09/26/16 1151      SLP [place] #1   Title pt will complete HEP with min A   Time 1   Period --  visit   Status Not Met  and ongoing     SLP SHORT TERM GOAL #2   Title pt will tell SLP why he is completing HEP    Time 1   Period --  visit   Status Partially Met     SLP [place] #3   Title pt will tell SLP 3 overt s/s aspiration PNA with occasional min A   Time 1   Period --  visit   Status On-going          SLP Long Term Goals - 09/26/16 1152      SLP LONG TERM GOAL #1   Title pt will complete HEP with min A over two sessions   Time 2   Period --  visits   Status On-going     SLP LONG TERM GOAL #2   Title pt will tell 3 overt s/s aspiration PNA with modified independence   Time 2   Period --  visit   Status On-going     SLP LONG TERM GOAL #3   Title pt will name 3 food items he could consume when recovering from radiation/chemo tx   Time 2   Period --  visits   Status On-going          Plan - 09/26/16 1147    Clinical Impression Statement Pt without overt s/s aspiration today. Pt required education on appropriate foods to try at this time and what to avoid. Pt had not adhered to HEP  recommendations, SLP educated (re-educated) pt to this today. All of pt's goals will remain (STGs and LTGs) for one more reporting period (until 12-08-16). Skilled ST remains necessary to assess PO safety and procedure with HEP following  his chemoradiation therapy.    Speech Therapy Frequency --  approx once every four weeks   Duration --  3 visits   Treatment/Interventions Aspiration precaution training;Pharyngeal strengthening exercises;Diet toleration management by SLP;Compensatory techniques;Internal/external aids;SLP instruction and feedback;Patient/family education;Trials of upgraded texture/liquids   Potential to Achieve Goals Fair   Potential Considerations Cooperation/participation level      Patient will benefit from skilled therapeutic intervention in order to improve the following deficits and impairments:   Dysphagia, oropharyngeal phase  Dysarthria and anarthria    Problem List Patient Active Problem List   Diagnosis Date Noted  . Acquired lymphedema 09/15/2016  . Anemia, chronic disease 08/30/2016  . Prerenal acute renal failure ([place]) 08/26/2016  . Pancytopenia, acquired ([place]) 07/27/2016  . Nausea without vomiting 07/27/2016  . Other constipation 07/20/2016  . 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 Malignant neoplasm of lower lobe of right lung ([place]) 06/28/2016  . Cancer associated pain 05/31/2016  . Dysarthria 05/31/2016  . 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[place]) 05/18/2016  . Sciatic leg pain 02/24/2015  . Preventative health care 12/01/2014  . Hyperlipidemia 02/20/2013  . Varus deformity of knee 06/16/2011  . COLONIC POLYPS 03/14/2006  . Allergic rhinitis 01/19/2006  . COPD 01/19/2006  . Essential hypertension 01/11/2006  . Osteoarthrosis involving lower leg 01/11/2006    [facility] ,MS, CCC-SLP  09/26/2016, 11:54 AM  [facility] [address] [place], [place], [address] Phone:  [phone]   Fax:  [phone]   Name: Antonio [name] MRN: [id number] Date of Birth: Jun 21, 1955

## 2016-09-27 ENCOUNTER — Other Ambulatory Visit: Payer: Self-pay

## 2016-09-27 ENCOUNTER — Other Ambulatory Visit (HOSPITAL_COMMUNITY): Payer: Self-pay

## 2016-09-27 NOTE — Patient Outreach (Signed)
[facility]) Care Management  09/27/2016  Tarquin Welcher 04/03/1955 [id number]    Medication Adherence call to Mr. Azell Bill the reason for this call is because Mr. Cazarez is showing past due under [employer] health care Ins. on his pravastatin 40 mg and lisinopril 20 mg spoke to Mr. Vancuren he said that doctor has take him off temporarily of both medications.    [facility] Management Direct Dial [phone]  Fax [phone] [email or web address]

## 2016-09-28 ENCOUNTER — Other Ambulatory Visit: Payer: Self-pay | Admitting: *Deleted

## 2016-09-28 NOTE — Progress Notes (Signed)
Oncology Nurse Navigator Documentation  Met with Mr. Hoogland during H&N [place].  He was unacompanied.  Provided verbal and written overview of [place], the clinicians who will be seeing him, encouraged him to ask questions during his time with them.  He was seen by Nutrition, SLP, PT, SW and [place].  Spoke with him at end of [facility], he denied questions. He understands I can be contacted with needs/concerns.  [name], RN, BSN, [place] at [place] [phone]

## 2016-09-28 NOTE — Patient Outreach (Signed)
[facility]) Care Management  09/28/2016  Antonio [name] 1955-09-17 [id number]  Telephone Screen  Referral Date: 09/27/16 Referral Source: Medication Adherence Referral Reason: Score 8, HTN, Cancer Insurance: [facility] Medicare   Outreach attempt #1 to patient. No answer. RN CM left HIPAA compliant message along with contact info.    Plan: RN CM will contact patient within one week.  [name], RN, BSN, MHA/MSL, [place] Telephonic Care Manager Coordinator [facility] Direct Phone: [phone] Toll Free: [phone] Fax: [phone]

## 2016-09-28 NOTE — Progress Notes (Signed)
Head & Neck Multidisciplinary Clinic Clinical Social Work  Clinical Social Work met with patient at head & neck multidisciplinary clinic to offer support and assess for psychosocial needs.  Mr. Bressman reported feeling that he is recovering well after treatment.  RD shared his only concern was that patient is unable to afford payment to [facility] for tube feeding.  Patient plans to use [facility] card to purchase additional ensure plus.  Clinical Social Work briefly discussed Clinical Social Work role and [employer] support programs/services.  Clinical Social Work encouraged patient to call with any additional questions or concerns.   Maryjean Morn, MSW, LCSW, OSW-C Clinical Social Worker [facility] [phone]

## 2016-10-03 ENCOUNTER — Other Ambulatory Visit: Payer: Self-pay | Admitting: Hematology and Oncology

## 2016-10-03 ENCOUNTER — Ambulatory Visit: Payer: Medicare Other | Admitting: Physical Therapy

## 2016-10-03 ENCOUNTER — Telehealth: Payer: Self-pay

## 2016-10-03 DIAGNOSIS — R1312 Dysphagia, oropharyngeal phase: Secondary | ICD-10-CM | POA: Diagnosis not present

## 2016-10-03 DIAGNOSIS — R471 Dysarthria and anarthria: Secondary | ICD-10-CM | POA: Diagnosis not present

## 2016-10-03 DIAGNOSIS — I890 Lymphedema, not elsewhere classified: Secondary | ICD-10-CM

## 2016-10-03 DIAGNOSIS — R29898 Other symptoms and signs involving the musculoskeletal system: Secondary | ICD-10-CM

## 2016-10-03 DIAGNOSIS — R293 Abnormal posture: Secondary | ICD-10-CM | POA: Diagnosis not present

## 2016-10-03 MED ORDER — MORPHINE SULFATE 15 MG PO TABS: 15.0000 mg | ORAL_TABLET | Freq: Four times a day (QID) | ORAL | 0 refills | Status: DC | PRN

## 2016-10-03 MED FILL — MORPHINE SULFATE IR 15 MG T: 15 | 15 days supply | Qty: 60 | Fill #0

## 2016-10-03 NOTE — Telephone Encounter (Signed)
Pt calling for morphine refill. This rn said we would call him when ready for pickup b/c he was asking "can I come anytime?"

## 2016-10-03 NOTE — Therapy (Signed)
[place], [place], [address] Phone: [phone]   Fax:  [phone]  Physical Therapy Treatment  Patient Details  Name: Antonio [name] MRN: [id number] Date of Birth: 11-06-1955 Referring Provider: Dr. [name]  Encounter Date: 10/03/2016      PT End of Session - 10/03/16 1401    Visit Number 2   Number of Visits 5   Date for PT Re-Evaluation 10/27/16   PT Start Time 5009   PT Stop Time 1348   PT Time Calculation (min) 43 min   Activity Tolerance Patient tolerated treatment well   Behavior During Therapy [facility] for tasks assessed/performed      Past Medical History:  Diagnosis Date  . Allergy    allergic rhinitis  . Arthritis    "qwhere" (05/17/2016)  . Chronic cough   . Chronic sinus infection   . Colonic polyp   . COPD (chronic obstructive pulmonary disease) ([place])   . 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 Dysphagia    normal EGD except for mild gastritis in 01/2006  . GERD (gastroesophageal reflux disease)   . Head and neck malignancy ([place]) 05/18/2016  . High cholesterol   . History of radiation therapy 06/28/16- 08/17/16   Larynx 70 Gy in 35 fractions to gross disease, 63 Gy in 35 fractions to high risk nodal echelons, and 56 Gy in 35 fractions to intermediate risk nodal echelons  . History of radiation therapy 06/28/16- 08/17/16   Right Lung 60 Gy in 5 fractions--- given at same time as Larynx radiation  . Hypertension   . Rectal bleeding    colonoscopy in 01/2006  . Rectal polyp    01/2006 with path showing it to be benign, though with a question that it was incomplete biopsy, next scheduled coloscopy 2010  . Sickle cell trait ([place])   . Tobacco abuse   . Varus deformity of knee    bilateral, congenital    Past Surgical History:  Procedure Laterality Date  . COLONOSCOPY W/ BIOPSIES AND POLYPECTOMY  X 2  . IR FLUORO GUIDE PORT INSERTION RIGHT  06/23/2016  . IR  GASTROSTOMY TUBE MOD SED  06/23/2016  . IR US GUIDE VASC ACCESS RIGHT  06/23/2016  . MULTIPLE TOOTH EXTRACTIONS  ~ 2008   "took them all out"    There were no vitals filed for this visit.      Subjective Assessment - 10/03/16 1306    Subjective I need to get in touch with [name] about getting my prescriptions refilled. Has been wearing the foam chip pack every day for an hour or two. "I can swallow better when I take it off."   Currently in Pain? No/denies   Pain Relieving Factors "I took a pill before I came."                         [facility] Adult PT Treatment/Exercise - 10/03/16 0001      Self-Care   Other Self-Care Comments  Discussed use of foam chip pack and educated about manufactured compression garments.Gave patient ordering info for a Contour neck compression garment, but he doesn't have a way to use credit to order one, so he was given information about two local vendors to try for being fitted.     Manual Therapy   Manual Therapy Manual Lymphatic Drainage (MLD)   Manual Lymphatic Drainage (MLD) Instructed patient in basics  of principles and techniques of manual lymph drainage.  Performed the following with patient in reclined position:  diaphragmatic breathing, supraclavicular fossae, bilat. axillae, bilat. shoulder collectors; posterolateral, lateral, anterolateral, and anterior neck; chin, cheeks, and preauricular areas; then retraced down pathways to axillae.                PT Education - 10/03/16 1401    Education provided Yes   Education Details about how and where to be fitted for a neck compression garment   Person(s) Educated Patient   Methods Explanation;Handout   Comprehension Verbalized understanding          PT Short Term Goals - 04/05/16 1349      PT SHORT TERM GOAL #1   Title make referral for orthotist   Time 2   Period Weeks   Status Achieved           PT Long Term Goals - 04/05/16 1349      PT LONG TERM GOAL #1    Title report pain in the hip and leg decreased 25%   Time 6   Period Weeks   Status New           Long Term Clinic Goals - 10/03/16 1405      CC Long Term Goal  #1   Title Pt. will be independent in self-manual lymph drainage.   Status On-going     CC Long Term Goal  #2   Title Pt. will be knowledgeable about where and how to obtain manufactured neck compression garment.   Status Partially Met         Head and Neck Clinic Goals - 06/20/16 1048      Patient will be able to verbalize understanding of a home exercise program for cervical range of motion, posture, and walking.    Status Achieved     Patient will be able to verbalize understanding of proper sitting and standing posture.    Status Achieved     Patient will be able to verbalize understanding of lymphedema risk and availability of treatment for this condition.    Status Achieved           Plan - 10/03/16 1401    Clinical Impression Statement Patient did well with instruction about being fitted for a compression garment and initial instruction about manual lymph drainage.  He has worn his foam chip pack that he was given last week, and does note benefit from wearing that for neck compression.   Rehab Potential Good   PT Frequency --  2-4 visits prn   PT Duration 4 weeks   PT Treatment/Interventions Patient/family education   PT Next Visit Plan Teach patient to do manual lymph drainage and give him handout.  Check on progress toward obtaining a neck compression garment.   PT Home Exercise Plan neck ROM, walking or other exercise   Consulted and Agree with Plan of Care Patient      Patient will benefit from skilled therapeutic intervention in order to improve the following deficits and impairments:  Pain, Increased edema, Decreased range of motion, Difficulty walking, Postural dysfunction  Visit Diagnosis: Lymphedema, not elsewhere classified  Abnormal posture  Other symptoms and  signs involving the musculoskeletal system     Problem List Patient Active Problem List   Diagnosis Date Noted  . Acquired lymphedema 09/15/2016  . Anemia, chronic disease 08/30/2016  . Prerenal acute renal failure (HCC) 08/26/2016  . Pancytopenia, acquired (HCC) 07/27/2016  . Nausea  without vomiting 07/27/2016  . Other constipation 07/20/2016  . 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 Malignant neoplasm of lower lobe of right lung ([place]) 06/28/2016  . Cancer associated pain 05/31/2016  . Dysarthria 05/31/2016  . 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 Sciatic leg pain 02/24/2015  . Preventative health care 12/01/2014  . Hyperlipidemia 02/20/2013  . Varus deformity of knee 06/16/2011  . COLONIC POLYPS 03/14/2006  . Allergic rhinitis 01/19/2006  . COPD 01/19/2006  . Essential hypertension 01/11/2006  . Osteoarthrosis involving lower leg 01/11/2006    Lashana Spang 10/03/2016, 2:05 PM  [place], [place], [address] Phone: [phone]   Fax:  [phone]  Name: Isador Castille MRN: [id number] Date of Birth: October 29, 1955  Serafina Royals, PT 10/03/16 2:06 PM

## 2016-10-03 NOTE — Telephone Encounter (Signed)
Ready for pickup Can you ask how he is doing with oral intake and constipation? Does he eat enough to consider getting feeding tube removed?

## 2016-10-03 NOTE — Telephone Encounter (Signed)
Pt has already picked up his rx. He states he is eating better. He controls constipation with senakot-s. He did not feel he was ready to get feeding tube removed.

## 2016-10-05 ENCOUNTER — Ambulatory Visit: Payer: Self-pay | Admitting: *Deleted

## 2016-10-05 ENCOUNTER — Other Ambulatory Visit: Payer: Self-pay | Admitting: *Deleted

## 2016-10-05 NOTE — Patient Outreach (Addendum)
Antonio [name]) Care Management  10/05/2016  Antonio [name] 1955/07/10 [id number]   Telephone Screen  Referral Date: 09/27/16 Referral Source: Medication Adherence Referral Reason: Score 8, HTN, Cancer Insurance:[employer] Medicare  Outreach attempt # 2, spoke with patient. HIPAA verified with patient.  Social:  Patient lives alone. He is independent with his ADLs. Patient depends on his sisters and other people to transport him to his medical appointments.    Conditions: Past Medical Hx: Arthritis, COPD, DJD, GERD, Head & Neck Malignancy, HLD, HTN Patient's referral was received from [facility]. Patient completed radiation on 08/17/16 for cancer of supraglottic laryngeal mass.  Patient reported, he will be having upcoming surgery due to narrowing of his "throat". He described symptoms of swallowing complications and being hoarse. He has a feeding tube, which he uses intermittently. He stated, he was having issues with affording his nutritional supplements. He reported having weight loss. He verbalized receiving assistance with purchasing supplements. Patient reported, he continues to take capsulated medications via his feeding tube, due to difficulty swallowing.  Patient had an outpatient rehab appointment on 10/03/16 for lymphedema. He continues to have lymphatic drainage, per EMR. Outpatient rehab recommends for patient to be fitted for a neck compression device to decrease drainage. He reported having constant pain in his hip and legs, which is being addressed at therapy.   Medications: Patient takes 2 medications per day. He can afford all of his medications. Patient takes his medications as prescribed.   Appointments: Patient reported, he has an upcoming surgical appointment for his "throat".  Advanced Directives: Pt reported not having an Advanced Directive. He declined to receive any information regarding AD.   Consent: [facility] services reviewed and discussed with patient.  Verbal consent received.  Plan: RN CM will send referral to [facility] RN for further in home eval/assessment of care needs and management of chronic conditions. RN CM advised patient to contact RNCM for any needs or concerns.   [name], RN, BSN, MHA/MSL, [place] Telephonic Care Manager Coordinator [facility] Direct Phone: [phone] Toll Free: [phone] Fax: [phone]

## 2016-10-09 ENCOUNTER — Ambulatory Visit: Payer: Self-pay

## 2016-10-10 ENCOUNTER — Encounter: Payer: Self-pay | Admitting: Physical Therapy

## 2016-10-10 ENCOUNTER — Ambulatory Visit: Payer: Medicare Other | Admitting: Physical Therapy

## 2016-10-10 ENCOUNTER — Ambulatory Visit: Payer: Self-pay

## 2016-10-10 DIAGNOSIS — I890 Lymphedema, not elsewhere classified: Secondary | ICD-10-CM

## 2016-10-10 DIAGNOSIS — R1312 Dysphagia, oropharyngeal phase: Secondary | ICD-10-CM | POA: Diagnosis not present

## 2016-10-10 DIAGNOSIS — R29898 Other symptoms and signs involving the musculoskeletal system: Secondary | ICD-10-CM | POA: Diagnosis not present

## 2016-10-10 DIAGNOSIS — R471 Dysarthria and anarthria: Secondary | ICD-10-CM | POA: Diagnosis not present

## 2016-10-10 DIAGNOSIS — R293 Abnormal posture: Secondary | ICD-10-CM | POA: Diagnosis not present

## 2016-10-10 NOTE — Therapy (Signed)
[place], [place], [address] Phone: [phone]   Fax:  [phone]  Physical Therapy Treatment  Patient Details  Name: Antonio [name] MRN: [id number] Date of Birth: 30-Nov-1955 Referring Provider: Dr. [name]  Encounter Date: 10/10/2016      PT End of Session - 10/10/16 1612    Visit Number 3   Number of Visits 5   Date for PT Re-Evaluation 10/27/16   PT Start Time 9179   PT Stop Time 1518   PT Time Calculation (min) 45 min   Activity Tolerance Patient tolerated treatment well   Behavior During Therapy [facility] for tasks assessed/performed      Past Medical History:  Diagnosis Date  . Allergy    allergic rhinitis  . Arthritis    "qwhere" (05/17/2016)  . Chronic cough   . Chronic sinus infection   . Colonic polyp   . COPD (chronic obstructive pulmonary disease) ([place])   . 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 Dysphagia    normal EGD except for mild gastritis in 01/2006  . GERD (gastroesophageal reflux disease)   . Head and neck malignancy ([place]) 05/18/2016  . High cholesterol   . History of radiation therapy 06/28/16- 08/17/16   Larynx 70 Gy in 35 fractions to gross disease, 63 Gy in 35 fractions to high risk nodal echelons, and 56 Gy in 35 fractions to intermediate risk nodal echelons  . History of radiation therapy 06/28/16- 08/17/16   Right Lung 60 Gy in 5 fractions--- given at same time as Larynx radiation  . Hypertension   . Rectal bleeding    colonoscopy in 01/2006  . Rectal polyp    01/2006 with path showing it to be benign, though with a question that it was incomplete biopsy, next scheduled coloscopy 2010  . Sickle cell trait ([place])   . Tobacco abuse   . Varus deformity of knee    bilateral, congenital    Past Surgical History:  Procedure Laterality Date  . COLONOSCOPY W/ BIOPSIES AND POLYPECTOMY  X 2  . IR FLUORO GUIDE PORT INSERTION RIGHT  06/23/2016  . IR  GASTROSTOMY TUBE MOD SED  06/23/2016  . IR US GUIDE VASC ACCESS RIGHT  06/23/2016  . MULTIPLE TOOTH EXTRACTIONS  ~ 2008   "took them all out"    There were no vitals filed for this visit.      Subjective Assessment - 10/10/16 1436    Subjective My swelling is doing alright. I have been wearing the chip pack but I have not worn it today. I am doing the massage.   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  Will have concurrent chemoradiation starting 06/27/16.  Current smoker; h/o crack cocaine, marijuana use, ETOH. Finished chemoradiation 08/17/16.   Patient Stated Goals reduce the swelling   Currently in Pain? No/denies   Pain Score 0-No pain                         [facility] Adult PT Treatment/Exercise - 10/10/16 0001      Manual Therapy   Manual Therapy Manual Lymphatic Drainage (MLD)   Manual Lymphatic Drainage (MLD) Issued handout and instructed patient in basics of principles and techniques of manual lymph drainage.  Performed the following with patient in reclined  position:  diaphragmatic breathing, supraclavicular fossae, bilat. axillae, bilat. shoulder collectors; posterolateral, lateral, anterolateral, and anterior neck; chin, cheeks, and preauricular areas; then retraced down pathways to axillae.                  PT Short Term Goals - 04/05/16 1349      PT SHORT TERM GOAL #1   Title make referral for orthotist   Time 2   Period Weeks   Status Achieved           PT Long Term Goals - 04/05/16 1349      PT LONG TERM GOAL #1   Title report pain in the hip and leg decreased 25%   Time 6   Period Weeks   Status New           Long Term Clinic Goals - 10/03/16 1405      CC Long Term Goal  #1   Title Pt. will be independent in self-manual lymph drainage.   Status On-going     CC Long  Term Goal  #2   Title Pt. will be knowledgeable about where and how to obtain manufactured neck compression garment.   Status Partially Met         Head and Neck Clinic Goals - 06/20/16 1048      Patient will be able to verbalize understanding of a home exercise program for cervical range of motion, posture, and walking.    Status Achieved     Patient will be able to verbalize understanding of proper sitting and standing posture.    Status Achieved     Patient will be able to verbalize understanding of lymphedema risk and availability of treatment for this condition.    Status Achieved           Plan - 10/10/16 1612    Clinical Impression Statement Issued handout to pt for self manual lymphatic drainage and educated pt throughout MLD today. Encouraged pt to perform drainage technique twice daily for 15-20 min. Pt states he has not been wearing his chip pack everyday and was encouraged to wear after he performs drainage. He plans on calling to schedule an appointment to be fitted for a head and neck garment.    Rehab Potential Good   PT Frequency --  2-4 visits prn   PT Duration 4 weeks   PT Treatment/Interventions Patient/family education   PT Next Visit Plan continue to teach patient to do manual lymph drainage and give him handout.  Check on progress toward obtaining a neck compression garment.   PT Home Exercise Plan neck ROM, walking or other exercise   Consulted and Agree with Plan of Care Patient      Patient will benefit from skilled therapeutic intervention in order to improve the following deficits and impairments:  Pain, Increased edema, Decreased range of motion, Difficulty walking, Postural dysfunction  Visit Diagnosis: Lymphedema, not elsewhere classified     Problem List Patient Active Problem List   Diagnosis Date Noted  . Acquired lymphedema 09/15/2016  . Anemia, chronic disease 08/30/2016  . Prerenal acute renal failure ([place])  08/26/2016  . Pancytopenia, acquired ([place]) 07/27/2016  . Nausea without vomiting 07/27/2016  . Other constipation 07/20/2016  . 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 Malignant neoplasm of lower lobe of right lung ([place]) 06/28/2016  . Cancer associated pain 05/31/2016  . Dysarthria 05/31/2016  . 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 Sciatic leg pain  02/24/2015  . Preventative health care 12/01/2014  . Hyperlipidemia 02/20/2013  . Varus deformity of knee 06/16/2011  . COLONIC POLYPS 03/14/2006  . Allergic rhinitis 01/19/2006  . COPD 01/19/2006  . Essential hypertension 01/11/2006  . Osteoarthrosis involving lower leg 01/11/2006    Allyson Sabal [facility] 10/10/2016, 4:15 PM  [place], [place], [address] Phone: [phone]   Fax:  [phone]  Name: Antonio [name] MRN: [id number] Date of Birth: 10-Apr-1955  Manus Gunning, PT 10/10/16 4:15 PM

## 2016-10-11 ENCOUNTER — Other Ambulatory Visit: Payer: Self-pay

## 2016-10-11 NOTE — Patient Outreach (Signed)
[facility]) Care Management  10/11/16  Antonio [name] [birth year] [id number]  RNCM received referral from telephonic nurse on 10/05/2016 for in-home assessment.  Patient has a past medical history of arthritis, COPD, DJD, GERD, head and neck malignancy, HLD, HTN. Patient completed radiation on 08/17/2016 for cancer of supraglottic laryngeal mass and has upcoming surgery due to narrowing of his throat. He has a recent history of difficulty swallowing and being hoarse. Patient has a feeding tube, uses intermittently. Weight loss noted and issues with lymphatic drainage. Participating in outpatient rehab.  Successful outreach completed with patient. Patient identification verified. RNCM explained purpose of call and role of [facility] RNCM and he was agreeable to outreach and a home visit, but not at present. He stated he was just getting ready to get him something to eat. RNCM attempted to schedule a home visit for this week but patient declined, stating he has to go and pay bills.  Plan: Home visit scheduled for next week. RNCM to complete initial assessment and establish care plan if patient requires follow up.  [place] R. Kal Chait, RN, BSN, [place] Management Coordinator [phone]

## 2016-10-18 ENCOUNTER — Other Ambulatory Visit: Payer: Self-pay

## 2016-10-18 ENCOUNTER — Other Ambulatory Visit: Payer: Self-pay | Admitting: Internal Medicine

## 2016-10-18 DIAGNOSIS — M5430 Sciatica, unspecified side: Secondary | ICD-10-CM

## 2016-10-18 NOTE — Patient Outreach (Unsigned)
[facility]) Care Management  [place]  10/18/2016   Antonio [name] [birth year] [id number]  Subjective:   Objective:   Encounter Medications:  Outpatient Encounter Prescriptions as of 10/18/2016  Medication Sig  . cyclobenzaprine (FLEXERIL) 10 MG tablet Take 1 tablet (10 mg total) by mouth at bedtime.  . gabapentin (NEURONTIN) 300 MG capsule Take 1 capsule (300 mg total) by mouth 3 (three) times daily.  Marland Kitchen morphine (MSIR) 15 MG tablet Take 1 tablet (15 mg total) by mouth every 6 (six) hours as needed for severe pain.  Marland Kitchen senna-docusate (SENOKOT-S) 8.6-50 MG tablet Take 1 tablet by mouth 2 (two) times daily.  [name] ALLERGY RELIEF 10 MG tablet TAKE ONE TABLET BY MOUTH ONCE DAILY (Patient not taking: Reported on 10/18/2016)  . lidocaine-prilocaine (EMLA) cream Apply to affected area once (Patient not taking: Reported on 10/18/2016)  . Nutritional Supplements (FEEDING SUPPLEMENT, OSMOLITE 1.5 CAL,) LIQD Begin Osmolite 1.5 via PEG TID with 60 mL free water. Advance as tolerated to goal of 1 1/2 bottles QID with 60 mL free water before and after. In addition, drink or flush PEG with 240 mL free water TID.  Marland Kitchen ondansetron (ZOFRAN) 8 MG tablet Take 1 tablet (8 mg total) by mouth 2 (two) times daily as needed. Start on the third day after chemotherapy. (Patient not taking: Reported on 10/18/2016)  . prochlorperazine (COMPAZINE) 10 MG tablet Take 1 tablet (10 mg total) by mouth every 6 (six) hours as needed (Nausea or vomiting). (Patient not taking: Reported on 10/18/2016)   No facility-administered encounter medications on file as of 10/18/2016.     Functional Status:  In your present state of health, do you have any difficulty performing the following activities: 10/18/2016 06/23/2016  Hearing? N N  Vision? N N  Difficulty concentrating or making decisions? N N  Walking or climbing stairs? Y Y  Comment avoids stairs -  Dressing or bathing? N N  Doing errands, shopping? Y -   Conservation officer, nature and eating ? N -  Using the Toilet? N -  In the past six months, have you accidently leaked urine? N -  Do you have problems with loss of bowel control? N -  Managing your Medications? Y -  Managing your Finances? N -  Housekeeping or managing your Housekeeping? N -  Some recent data might be hidden    Fall/Depression Screening: Fall Risk  10/18/2016 10/05/2016 09/22/2016  Falls in the past year? No No No  Risk for fall due to : - - -  Risk for fall due to: Comment - - -   PHQ 2/9 Scores 10/18/2016 10/05/2016 09/22/2016 06/14/2016 06/02/2016 05/17/2016 03/21/2016  PHQ - 2 Score 0 1 0 0 0 1 2  PHQ- 9 Score - - - - - - 4    Assessment:   Plan:

## 2016-10-19 ENCOUNTER — Other Ambulatory Visit: Payer: Self-pay | Admitting: Hematology and Oncology

## 2016-10-19 ENCOUNTER — Telehealth: Payer: Self-pay | Admitting: *Deleted

## 2016-10-19 MED ORDER — MORPHINE SULFATE 15 MG PO TABS: 15.0000 mg | ORAL_TABLET | Freq: Four times a day (QID) | ORAL | 0 refills | Status: DC | PRN

## 2016-10-19 MED FILL — MORPHINE SULFATE IR 15 MG T: 15 | 15 days supply | Qty: 60 | Fill #0

## 2016-10-19 NOTE — Telephone Encounter (Signed)
Ready for pick up

## 2016-10-19 NOTE — Telephone Encounter (Signed)
Spoke with patient notifying her the prescription is ready for pick up.  No further questions or needs at this time.

## 2016-10-19 NOTE — Telephone Encounter (Signed)
Patient called voicemail requesting refill for Morphine.  Return number when ready for pick up is [phone].   Second call reports "I only have one pill left.  When will it be ready.  Please call me back."

## 2016-10-23 DIAGNOSIS — C770 Secondary and unspecified malignant neoplasm of lymph nodes of head, face and neck: Secondary | ICD-10-CM | POA: Diagnosis not present

## 2016-10-23 DIAGNOSIS — C321 Malignant neoplasm of supraglottis: Secondary | ICD-10-CM | POA: Diagnosis not present

## 2016-10-23 DIAGNOSIS — R1314 Dysphagia, pharyngoesophageal phase: Secondary | ICD-10-CM | POA: Diagnosis not present

## 2016-10-24 ENCOUNTER — Encounter (HOSPITAL_COMMUNITY): Payer: Self-pay | Admitting: Dentistry

## 2016-10-24 ENCOUNTER — Other Ambulatory Visit: Payer: Self-pay

## 2016-10-24 ENCOUNTER — Ambulatory Visit (HOSPITAL_COMMUNITY): Payer: Self-pay | Admitting: Dentistry

## 2016-10-24 VITALS — BP 137/70 | HR 92 | Temp 98.9°F | Wt 135.0 lb

## 2016-10-24 DIAGNOSIS — C329 Malignant neoplasm of larynx, unspecified: Secondary | ICD-10-CM

## 2016-10-24 DIAGNOSIS — Z923 Personal history of irradiation: Secondary | ICD-10-CM

## 2016-10-24 DIAGNOSIS — Z5189 Encounter for other specified aftercare: Secondary | ICD-10-CM | POA: Diagnosis not present

## 2016-10-24 DIAGNOSIS — K117 Disturbances of salivary secretion: Secondary | ICD-10-CM

## 2016-10-24 DIAGNOSIS — K0820 Unspecified atrophy of edentulous alveolar ridge: Secondary | ICD-10-CM

## 2016-10-24 DIAGNOSIS — K08109 Complete loss of teeth, unspecified cause, unspecified class: Secondary | ICD-10-CM

## 2016-10-24 DIAGNOSIS — R682 Dry mouth, unspecified: Secondary | ICD-10-CM

## 2016-10-24 DIAGNOSIS — Z9221 Personal history of antineoplastic chemotherapy: Secondary | ICD-10-CM

## 2016-10-24 DIAGNOSIS — R1310 Dysphagia, unspecified: Secondary | ICD-10-CM

## 2016-10-24 NOTE — Patient Outreach (Signed)
[facility]) Care Management  10/24/16  [name] 1955/06/22 [id number]  Successful outreach completed with patient. Patient identification verified.   Patient stated that he could not talk right now. Stated he just got in from a doctor's appointment and was trying to get him something to eat, stating he is hungry.  Patient was able to get his morphine refilled and denies any current complaints or needs.   Plan: RNCM will follow up with patient within next week.  [place] R. Kyrie Fludd, RN, BSN, [place] Management Coordinator [phone]

## 2016-10-24 NOTE — Patient Instructions (Signed)
RECOMMENDATIONS: 1. Brush tongue daily.  2. Use trismus exercises as directed. 3. Use Biotene Rinse or salt water/baking soda rinses. 4. Multiple sips of water as needed. 5. Return to the dentist of his choice for fabrication of new upper and lower complete dentures no earlier than 11/17/2016.   Lenn Cal, DDS

## 2016-10-24 NOTE — Progress Notes (Signed)
10/24/2016  Patient Name:   Antonio [name] Date of Birth:   [birth year] Medical Record Number: [id number]  BP 137/70 (BP Location: Left Arm)   Pulse 92   Temp 98.9 F (37.2 C) (Oral)   Wt 135 lb (61.2 kg)   BMI 23.91 kg/m   [name] presents for oral examination after chemoradiation therapy. Patient has completed all radiation treatments from 06/28/2016 3 08/17/2016. Patient completed 3 chemotherapy treatments.  REVIEW OF CHIEF COMPLAINTS:  DRY MOUTH: Yes. HARD TO SWALLOW: Yes, at times.  HURT TO SWALLOW: Yes, at times. TASTE CHANGES: Taste is returning. SORES IN MOUTH: No sores in his mouth by report TRISMUS: No problems with trismus symptoms WEIGHT: 135 pounds down from initial 180 pounds  HOME OH REGIMEN:  BRUSHING: Brushing his tongue daily. FLOSSING: Not applicable RINSING: Using Biotene rinses FLUORIDE: Not applicable TRISMUS EXERCISES:  Maximum interincisal opening: 50 mm   DENTAL EXAM:  Oral Hygiene:(PLAQUE): Edentulous. LOCATION OF MUCOSITIS: None noted DESCRIPTION OF SALIVA: Decreased saliva. ANY EXPOSED BONE: None noted OTHER WATCHED AREAS: Previous extraction site of the impacted tooth #17 by oral surgeon DX: Xerostomia, Dysphagia, Odynophagia and Edentulous and atrophy of alveolar ridges.  RECOMMENDATIONS: 1. Brush tongue daily.  2. Use trismus exercises as directed. 3. Use Biotene Rinse or salt water/baking soda rinses. 4. Multiple sips of water as needed. 5. Return to the dentist of his choice for fabrication of new upper and lower complete dentures no earlier than 11/17/2016 if so desired   Lenn Cal, DDS

## 2016-10-25 ENCOUNTER — Ambulatory Visit: Payer: Medicare Other | Attending: Hematology and Oncology

## 2016-10-25 DIAGNOSIS — R293 Abnormal posture: Secondary | ICD-10-CM | POA: Insufficient documentation

## 2016-10-25 DIAGNOSIS — R29898 Other symptoms and signs involving the musculoskeletal system: Secondary | ICD-10-CM | POA: Diagnosis not present

## 2016-10-25 DIAGNOSIS — I890 Lymphedema, not elsewhere classified: Secondary | ICD-10-CM | POA: Diagnosis not present

## 2016-10-25 NOTE — Therapy (Signed)
[place], [place], [address] Phone: [phone]   Fax:  [phone]  Physical Therapy Treatment  Patient Details  Name: Antonio [name] MRN: [id number] Date of Birth: [birth year] Referring Provider: Dr. [name]  Encounter Date: 10/25/2016      PT End of Session - 10/25/16 1432    Visit Number 4   Number of Visits 5   Date for PT Re-Evaluation 10/27/16   PT Start Time 1350   PT Stop Time 1432   PT Time Calculation (min) 42 min   Activity Tolerance Patient tolerated treatment well   Behavior During Therapy [facility] for tasks assessed/performed      Past Medical History:  Diagnosis Date  . Allergy    allergic rhinitis  . Arthritis    "qwhere" (05/17/2016)  . Chronic cough   . Chronic sinus infection   . Colonic polyp   . COPD (chronic obstructive pulmonary disease) ([place])   . 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 Dysphagia    normal EGD except for mild gastritis in 01/2006  . GERD (gastroesophageal reflux disease)   . Head and neck malignancy ([place]) 05/18/2016  . High cholesterol   . History of radiation therapy 06/28/16- 08/17/16   Larynx 70 Gy in 35 fractions to gross disease, 63 Gy in 35 fractions to high risk nodal echelons, and 56 Gy in 35 fractions to intermediate risk nodal echelons  . History of radiation therapy 06/28/16- 08/17/16   Right Lung 60 Gy in 5 fractions--- given at same time as Larynx radiation  . Hypertension   . Rectal bleeding    colonoscopy in 01/2006  . Rectal polyp    01/2006 with path showing it to be benign, though with a question that it was incomplete biopsy, next scheduled coloscopy 2010  . Sickle cell trait ([place])   . Tobacco abuse   . Varus deformity of knee    bilateral, congenital    Past Surgical History:  Procedure Laterality Date  . COLONOSCOPY W/ BIOPSIES AND POLYPECTOMY  X 2  . IR FLUORO GUIDE PORT INSERTION RIGHT  06/23/2016  . IR  GASTROSTOMY TUBE MOD SED  06/23/2016  . IR US GUIDE VASC ACCESS RIGHT  06/23/2016  . MULTIPLE TOOTH EXTRACTIONS  ~ 2008   "took them all out"    There were no vitals filed for this visit.      Subjective Assessment - 10/25/16 1353    Subjective I've been wearing the chip pack and doing the massage normally twice a day but haven't this week bc I've had doctor appts every day. I have a sore throat, I think I'm getting a cold. I feel like my throat has gotten alot better.    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  Will have concurrent chemoradiation starting 06/27/16.  Current smoker; h/o crack cocaine, marijuana use, ETOH. Finished chemoradiation 08/17/16.   Limitations Standing;Walking   Patient Stated Goals reduce the swelling   Currently in Pain? No/denies               LYMPHEDEMA/ONCOLOGY QUESTIONNAIRE - 10/25/16 1355      Head and Neck   4 cm superior to sternal notch around neck 38.2 cm   6 cm superior to sternal notch around neck 37.9 cm   8 cm  superior to sternal notch around neck 39.7 cm                  [facility] Adult PT Treatment/Exercise - 10/25/16 0001      Manual Therapy   Manual Therapy Manual Lymphatic Drainage (MLD)   Manual Lymphatic Drainage (MLD) Reviewed with pt while performing the following with patient in reclined position:  diaphragmatic breathing, supraclavicular fossae, bilat. axillae, bilat. shoulder collectors, bil anterior chest avoiding port, then; posterolateral, lateral, anterolateral, and anterior neck; chin, cheeks, and preauricular areas; then retraced down pathways to axillae.                  PT Short Term Goals - 04/05/16 1349      PT SHORT TERM GOAL #1   Title make referral for orthotist   Time 2   Period Weeks   Status Achieved           PT Long Term  Goals - 04/05/16 1349      PT LONG TERM GOAL #1   Title report pain in the hip and leg decreased 25%   Time 6   Period Weeks   Status New           Long Term Clinic Goals - 10/25/16 1437      CC Long Term Goal  #1   Title Pt. will be independent in self-manual lymph drainage.   Status Achieved     CC Long Term Goal  #2   Title Pt. will be knowledgeable about where and how to obtain manufactured neck compression garment.   Baseline Pt is wearing his chip pack daily and reports knows to go to [facility] if he finds he needs a compression garment but can not afford one at this time-10/25/16   Status Achieved         Head and Neck Clinic Goals - 06/20/16 1048      Patient will be able to verbalize understanding of a home exercise program for cervical range of motion, posture, and walking.    Status Achieved     Patient will be able to verbalize understanding of proper sitting and standing posture.    Status Achieved     Patient will be able to verbalize understanding of lymphedema risk and availability of treatment for this condition.    Status Achieved           Plan - 10/25/16 1433    Clinical Impression Statement Remeasured pts circumfrerence and he has made excellent progress overall with his reductions. When questioned about getting a head/neck compression garment he said he would look into it when he had more money. So advised him that it was okay to cont with wear of his chip pack and performing self MLD 1-2/daily and he wouldn't need the garment at this time as finaances are a concern. But he also verbalized knowing to go to [facility] if he has a flare up down the road that he couldn't self treat. Pt verbalized understanding all this.    Rehab Potential Good   PT Frequency --  2-4 visits prn   PT Duration 4 weeks   PT Treatment/Interventions Patient/family education   PT Next Visit Plan continue to teach patient to do manual lymph  drainage prn.  D/C next visit per POC.   Consulted and Agree with Plan of Care Patient      Patient will benefit from skilled therapeutic intervention in order to improve the  following deficits and impairments:  Pain, Increased edema, Decreased range of motion, Difficulty walking, Postural dysfunction  Visit Diagnosis: Lymphedema, not elsewhere classified  Abnormal posture  Other symptoms and signs involving the musculoskeletal system     Problem List Patient Active Problem List   Diagnosis Date Noted  . Acquired lymphedema 09/15/2016  . Anemia, chronic disease 08/30/2016  . Prerenal acute renal failure ([place]) 08/26/2016  . Pancytopenia, acquired ([place]) 07/27/2016  . Nausea without vomiting 07/27/2016  . Other constipation 07/20/2016  . 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 Malignant neoplasm of lower lobe of right lung ([place]) 06/28/2016  . Cancer associated pain 05/31/2016  . Dysarthria 05/31/2016  . 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[place]) 05/18/2016  . Sciatic leg pain 02/24/2015  . Preventative health care 12/01/2014  . Hyperlipidemia 02/20/2013  . Varus deformity of knee 06/16/2011  . COLONIC POLYPS 03/14/2006  . Allergic rhinitis 01/19/2006  . COPD 01/19/2006  . Essential hypertension 01/11/2006  . Osteoarthrosis involving lower leg 01/11/2006    Otelia Limes, PTA 10/25/2016, 2:38 PM  [place], [place], [address] Phone: [phone]   Fax:  [phone]  Name: Antonio [name] MRN: [id number] Date of Birth: October 25, 1955

## 2016-10-27 ENCOUNTER — Ambulatory Visit: Payer: Medicare Other

## 2016-10-31 ENCOUNTER — Ambulatory Visit: Payer: Medicare Other | Admitting: Physical Therapy

## 2016-11-02 ENCOUNTER — Other Ambulatory Visit: Payer: Self-pay | Admitting: *Deleted

## 2016-11-02 MED ORDER — MORPHINE SULFATE 15 MG PO TABS: 15.0000 mg | ORAL_TABLET | Freq: Four times a day (QID) | ORAL | 0 refills | Status: DC | PRN

## 2016-11-02 NOTE — Telephone Encounter (Signed)
Pt called for refill of Morphine.

## 2016-11-03 ENCOUNTER — Ambulatory Visit: Payer: Medicare Other | Admitting: Physical Therapy

## 2016-11-03 MED FILL — GABAPENTIN 300 MG CAPSULE: 300 | 30 days supply | Qty: 90 | Fill #1

## 2016-11-03 MED FILL — MORPHINE SULFATE IR 15 MG T: 15 | 15 days supply | Qty: 60 | Fill #0

## 2016-11-06 ENCOUNTER — Other Ambulatory Visit (HOSPITAL_BASED_OUTPATIENT_CLINIC_OR_DEPARTMENT_OTHER): Payer: Medicare Other

## 2016-11-06 ENCOUNTER — Telehealth: Payer: Self-pay | Admitting: *Deleted

## 2016-11-06 ENCOUNTER — Other Ambulatory Visit: Payer: Self-pay

## 2016-11-06 ENCOUNTER — Ambulatory Visit (HOSPITAL_BASED_OUTPATIENT_CLINIC_OR_DEPARTMENT_OTHER): Payer: Medicare Other | Admitting: Hematology and Oncology

## 2016-11-06 ENCOUNTER — Ambulatory Visit: Payer: Medicare Other

## 2016-11-06 ENCOUNTER — Telehealth: Payer: Self-pay | Admitting: Hematology and Oncology

## 2016-11-06 DIAGNOSIS — C770 Secondary and unspecified malignant neoplasm of lymph nodes of head, face and neck: Secondary | ICD-10-CM

## 2016-11-06 DIAGNOSIS — C321 Malignant neoplasm of supraglottis: Secondary | ICD-10-CM | POA: Diagnosis not present

## 2016-11-06 DIAGNOSIS — G893 Neoplasm related pain (acute) (chronic): Secondary | ICD-10-CM

## 2016-11-06 DIAGNOSIS — R634 Abnormal weight loss: Secondary | ICD-10-CM | POA: Diagnosis not present

## 2016-11-06 DIAGNOSIS — Z95828 Presence of other vascular implants and grafts: Secondary | ICD-10-CM

## 2016-11-06 DIAGNOSIS — I890 Lymphedema, not elsewhere classified: Secondary | ICD-10-CM

## 2016-11-06 LAB — CBC WITH DIFFERENTIAL/PLATELET
BASO%: 0.3 % (ref 0.0–2.0)
BASOS ABS: 0 10*3/uL (ref 0.0–0.1)
EOS%: 1.5 % (ref 0.0–7.0)
Eosinophils Absolute: 0.1 10*3/uL (ref 0.0–0.5)
HEMATOCRIT: 31.3 % — AB (ref 38.4–49.9)
HGB: 10.3 g/dL — ABNORMAL LOW (ref 13.0–17.1)
LYMPH#: 0.4 10*3/uL — AB (ref 0.9–3.3)
LYMPH%: 6.2 % — AB (ref 14.0–49.0)
MCH: 27.6 pg (ref 27.2–33.4)
MCHC: 32.9 g/dL (ref 32.0–36.0)
MCV: 83.9 fL (ref 79.3–98.0)
MONO#: 0.8 10*3/uL (ref 0.1–0.9)
MONO%: 11.9 % (ref 0.0–14.0)
NEUT#: 5.2 10*3/uL (ref 1.5–6.5)
NEUT%: 80.1 % — AB (ref 39.0–75.0)
Platelets: 280 10*3/uL (ref 140–400)
RBC: 3.73 10*6/uL — ABNORMAL LOW (ref 4.20–5.82)
RDW: 15.6 % — ABNORMAL HIGH (ref 11.0–14.6)
WBC: 6.5 10*3/uL (ref 4.0–10.3)

## 2016-11-06 LAB — COMPREHENSIVE METABOLIC PANEL
AST: 13 U/L (ref 5–34)
Albumin: 3.5 g/dL (ref 3.5–5.0)
Alkaline Phosphatase: 68 U/L (ref 40–150)
Anion Gap: 7 mEq/L (ref 3–11)
BILIRUBIN TOTAL: 0.61 mg/dL (ref 0.20–1.20)
BUN: 11 mg/dL (ref 7.0–26.0)
CO2: 31 meq/L — AB (ref 22–29)
CREATININE: 1 mg/dL (ref 0.7–1.3)
Calcium: 12 mg/dL — ABNORMAL HIGH (ref 8.4–10.4)
Chloride: 101 mEq/L (ref 98–109)
EGFR: 90 mL/min/{1.73_m2} — ABNORMAL LOW (ref >90)
GLUCOSE: 124 mg/dL (ref 70–140)
Potassium: 3.8 mEq/L (ref 3.5–5.1)
SODIUM: 139 meq/L (ref 136–145)
TOTAL PROTEIN: 7.3 g/dL (ref 6.4–8.3)

## 2016-11-06 LAB — MAGNESIUM: MAGNESIUM: 2.3 mg/dL (ref 1.5–2.5)

## 2016-11-06 MED ORDER — SODIUM CHLORIDE 0.9% FLUSH
10.0000 mL | Freq: Once | INTRAVENOUS | Status: AC
  Administered 2016-11-06: 10 mL
  Filled 2016-11-06: qty 10

## 2016-11-06 MED ORDER — HEPARIN SOD (PORK) LOCK FLUSH 100 UNIT/ML IV SOLN
500.0000 [IU] | Freq: Once | INTRAVENOUS | Status: AC
  Administered 2016-11-06: 500 [IU]
  Filled 2016-11-06: qty 5

## 2016-11-06 NOTE — Telephone Encounter (Signed)
Notified that labs are elevated- needs to eat and drink as much as possible this week. Case of osmolyte obtained for patient. Will come for labs on Monday at 1:00pm  Msg to scheduler

## 2016-11-06 NOTE — Telephone Encounter (Signed)
Gave avs and calendar for November  °

## 2016-11-07 ENCOUNTER — Encounter: Payer: Self-pay | Admitting: Hematology and Oncology

## 2016-11-07 ENCOUNTER — Telehealth: Payer: Self-pay | Admitting: Hematology and Oncology

## 2016-11-07 NOTE — Assessment & Plan Note (Signed)
The patient has mild hypercalcemia The rest of his blood work show signs of hemoconcentration I recommend increase hydration and oral intake or through the feeding tube as much as possible Plan to recheck blood work again next week If he has persistent hypercalcemia, I will order imaging study

## 2016-11-07 NOTE — Telephone Encounter (Signed)
Unable to schedule appt per 9/24 sch message time -left message for patient with appt date and time.

## 2016-11-07 NOTE — Assessment & Plan Note (Signed)
He had profound recent weight loss He stated that the cause of weight loss is due to pain prohibiting swallowing and his inability to afford nutritional supplement I will consult nutritionist to see if we can get him some additional nutritional supplement

## 2016-11-07 NOTE — Assessment & Plan Note (Signed)
He is cancer pain got better when I saw him last but then he had recurrent cancer pain He is prescribed morphine sulfate to take as needed I recommend he takes morphine before each meals

## 2016-11-07 NOTE — Progress Notes (Signed)
[facility] OFFICE PROGRESS NOTE  Patient Care Team: Maryellen Pile, MD as PCP - General [name], MD as Consulting Physician (Hematology and Oncology) [name], MD as Attending Physician (Radiation Oncology) Leota Sauers, RN as Oncology Nurse Navigator Karie Mainland, RD as Dietitian (Nutrition) Valentino Saxon [place], [place] as Speech Language Pathologist (Speech Pathology) Jomarie Longs, PT as Physical Therapist (Physical Therapy) Kennith [place], LCSW as Social Worker [place], [place], RN as [place] Management  SUMMARY OF ONCOLOGIC HISTORY:   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[place])   05/17/2016 Imaging    Ct neck: 19 x 19 mm 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 LEFT neck lymphadenopathy including LEFT level IIa to level 3 4.7 x 4.6 x 7.6 cm necrotic nodal conglomeration with suspected extracapsular spread of disease. Moderately narrowed hypopharynx.       05/18/2016 Procedure    Technically successful ultrasound-guided core biopsy of left cervical adenopathy.       05/18/2016 Pathology Results    Lymph node, needle/core biopsy, L cerv LAN - SQUAMOUS CELL CARCINOMA, SEE COMMENT. Microscopic Comment Residual lymph node is not seen. There is tumor invading skeletal muscle. p16 weakly positive      06/06/2016 Procedure    He had dental extraction      06/08/2016 PET scan    1. 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 2. Hypermetabolic right lower lobe pulmonary nodule is favored to represent a synchronous primary bronchogenic neoplasm. Isolated pulmonary metastasis could look similar. No thoracic nodal hypermetabolism. 3. Soft tissue hypermetabolism about the right iliac bone and greater trochanter of the proximal right femur are favored to be degenerative or posttraumatic. Soft tissue  metastasis felt highly unlikely. 4.  Coronary artery atherosclerosis. Aortic atherosclerosis. 5. A tiny right lower lobe pulmonary nodule is most likely a subpleural lymph node.      06/15/2016 Procedure    He has near normal baseline hearing test      06/23/2016 Procedure    Ultrasound and fluoroscopically guided right internal jugular single lumen power port catheter insertion. Tip in the SVC/RA junction. Catheter ready for use      06/23/2016 Procedure    Fluoroscopic insertion of a 20-French "pull-through" gastrostomy.      06/28/2016 - 08/17/2016 Radiation Therapy    Radiation treatment dates:   06/28/16 - 08/17/16  Site/dose:     1) Larynx and neck / 70 Gy in 35 fractions to gross disease, 63 Gy in 35 fractions to high risk nodal echelons, and 56 Gy in 35 fractions to intermediate risk nodal echelons                         2) Right Lower Lung treated to 54 Gy in 3 fractions  Beams/energy:   1)  IMRT // 6 MV photons                              2) SBRT/SRT-VMAT // 6X-FFF        06/30/2016 - 08/11/2016 Chemotherapy    He received chemotherapy with high dose cisplatin       INTERVAL HISTORY: Please see below for problem oriented charting. He returns for further follow-up He has lost almost 10 pounds since the last time he was seen he complained of significant pain and  dysphagia He was not able to afford nutritional supplement He denies nausea He saw ear nose and throat specialist recently with no abnormal findings He denies recent smoking or drinking  REVIEW OF SYSTEMS:   Constitutional: Denies fevers, chills  Eyes: Denies blurriness of vision Respiratory: Denies cough, dyspnea or wheezes Cardiovascular: Denies palpitation, chest discomfort or lower extremity swelling Gastrointestinal:  Denies nausea, heartburn or change in bowel habits Skin: Denies abnormal skin rashes Lymphatics: Denies new lymphadenopathy or easy bruising Neurological:Denies numbness, tingling or new  weaknesses Behavioral/Psych: Mood is stable, no new changes  All other systems were reviewed with the patient and are negative.  I have reviewed the past medical history, past surgical history, social history and family history with the patient and they are unchanged from previous note.  ALLERGIES:  has No Known Allergies.  MEDICATIONS:  Current Outpatient Prescriptions  Medication Sig Dispense Refill  . cyclobenzaprine (FLEXERIL) 10 MG tablet TAKE ONE TABLET BY MOUTH AT BEDTIME 30 tablet 1  . EQ ALLERGY RELIEF 10 MG tablet TAKE ONE TABLET BY MOUTH ONCE DAILY (Patient not taking: Reported on 10/18/2016) 90 tablet 3  . gabapentin (NEURONTIN) 300 MG capsule Take 1 capsule (300 mg total) by mouth 3 (three) times daily. 90 capsule 3  . lidocaine-prilocaine (EMLA) cream Apply to affected area once (Patient not taking: Reported on 10/18/2016) 30 g 3  . morphine (MSIR) 15 MG tablet Take 1 tablet (15 mg total) by mouth every 6 (six) hours as needed for severe pain. 60 tablet 0  . Nutritional Supplements (FEEDING SUPPLEMENT, OSMOLITE 1.5 CAL,) LIQD Begin Osmolite 1.5 via PEG TID with 60 mL free water. Advance as tolerated to goal of 1 1/2 bottles QID with 60 mL free water before and after. In addition, drink or flush PEG with 240 mL free water TID. 6 Bottle 11  . ondansetron (ZOFRAN) 8 MG tablet Take 1 tablet (8 mg total) by mouth 2 (two) times daily as needed. Start on the third day after chemotherapy. (Patient not taking: Reported on 10/18/2016) 30 tablet 1  . prochlorperazine (COMPAZINE) 10 MG tablet Take 1 tablet (10 mg total) by mouth every 6 (six) hours as needed (Nausea or vomiting). (Patient not taking: Reported on 10/18/2016) 30 tablet 1  . senna-docusate (SENOKOT-S) 8.6-50 MG tablet Take 1 tablet by mouth 2 (two) times daily. 60 tablet 3   No current facility-administered medications for this visit.     PHYSICAL EXAMINATION: ECOG PERFORMANCE STATUS: 1 - Symptomatic but completely  ambulatory  Vitals:   11/06/16 1423  BP: (!) 147/80  Pulse: (!) 102  Resp: 18  Temp: 99.1 F (37.3 C)  SpO2: 100%   Filed Weights   11/06/16 1423  Weight: 126 lb 6.4 oz (57.3 kg)    GENERAL:alert, no distress and comfortable SKIN: skin color, texture, turgor are normal, no rashes or significant lesions EYES: normal, Conjunctiva are pink and non-injected, sclera clear OROPHARYNX:no exudate, no erythema and lips, buccal mucosa, and tongue normal  NECK: Noted lymphedema around his neck  LYMPH:  no palpable lymphadenopathy in the cervical, axillary or inguinal LUNGS: clear to auscultation and percussion with normal breathing effort HEART: regular rate & rhythm and no murmurs and no lower extremity edema ABDOMEN:abdomen soft, non-tender and normal bowel sounds Musculoskeletal:no cyanosis of digits and no clubbing  NEURO: alert & oriented x 3 with fluent speech, no focal motor/sensory deficits.  Noted hoarseness  LABORATORY DATA:  I have reviewed the data as listed    Component  Value Date/Time   NA 139 11/06/2016 1333   K 3.8 11/06/2016 1333   CL 97 (L) 06/23/2016 0954   CO2 31 (H) 11/06/2016 1333   GLUCOSE 124 11/06/2016 1333   BUN 11.0 11/06/2016 1333   CREATININE 1.0 11/06/2016 1333   CALCIUM 12.0 (H) 11/06/2016 1333   PROT 7.3 11/06/2016 1333   ALBUMIN 3.5 11/06/2016 1333   AST 13 11/06/2016 1333   ALT <6 11/06/2016 1333   ALKPHOS 68 11/06/2016 1333   BILITOT 0.61 11/06/2016 1333   GFRNONAA >60 06/23/2016 0954   GFRAA >60 06/23/2016 0954    No results found for: SPEP, UPEP  Lab Results  Component Value Date   WBC 6.5 11/06/2016   NEUTROABS 5.2 11/06/2016   HGB 10.3 (L) 11/06/2016   HCT 31.3 (L) 11/06/2016   MCV 83.9 11/06/2016   PLT 280 11/06/2016      Chemistry      Component Value Date/Time   NA 139 11/06/2016 1333   K 3.8 11/06/2016 1333   CL 97 (L) 06/23/2016 0954   CO2 31 (H) 11/06/2016 1333   BUN 11.0 11/06/2016 1333   CREATININE 1.0  11/06/2016 1333      Component Value Date/Time   CALCIUM 12.0 (H) 11/06/2016 1333   ALKPHOS 68 11/06/2016 1333   AST 13 11/06/2016 1333   ALT <6 11/06/2016 1333   BILITOT 0.61 11/06/2016 1333      ASSESSMENT & PLAN:  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[place]) I am concerned for recent weight loss According to the patient, he was not able to afford nutritional supplement Due to dysphagia, he is not eating or swallowing well Return blood work show mild hypercalcemia which I suspect could be due to dehydration but malignancy cannot be excluded His recent ENT evaluation did not disclose any residual disease I am waiting for repeat blood work next week If he has persistent hypercalcemia, I would recommend repeat imaging study sooner  Hypercalcemia The patient has mild hypercalcemia The rest of his blood work show signs of hemoconcentration I recommend increase hydration and oral intake or through the feeding tube as much as possible Plan to recheck blood work again next week If he has persistent hypercalcemia, I will order imaging study  Cancer associated pain He is cancer pain got better when I saw him last but then he had recurrent cancer pain He is prescribed morphine sulfate to take as needed I recommend he takes morphine before each meals  Acquired lymphedema He has significant acquired lymphedema I will refer him back to physical therapist for neck exercises  Weight loss He had profound recent weight loss He stated that the cause of weight loss is due to pain prohibiting swallowing and his inability to afford nutritional supplement I will consult nutritionist to see if we can get him some additional nutritional supplement   No orders of the defined types were placed in this encounter.  All questions were answered. The patient knows to call the clinic with any problems, questions or concerns. No barriers to learning was detected. I spent 30 minutes counseling the patient face to face.  The total time spent in the appointment was 40 minutes and more than 50% was on counseling and review of test results     [name], MD 11/07/2016 4:46 PM

## 2016-11-07 NOTE — Assessment & Plan Note (Signed)
He has significant acquired lymphedema I will refer him back to physical therapist for neck exercises

## 2016-11-07 NOTE — Assessment & Plan Note (Signed)
I am concerned for recent weight loss According to the patient, he was not able to afford nutritional supplement Due to dysphagia, he is not eating or swallowing well Return blood work show mild hypercalcemia which I suspect could be due to dehydration but malignancy cannot be excluded His recent ENT evaluation did not disclose any residual disease I am waiting for repeat blood work next week If he has persistent hypercalcemia, I would recommend repeat imaging study sooner

## 2016-11-10 ENCOUNTER — Other Ambulatory Visit: Payer: Self-pay

## 2016-11-10 NOTE — Patient Outreach (Signed)
[facility]) Care Management  11/10/16  Draken Farrior 02/23/1955 [id number]  Successful outreach completed with patient. Patient identification verified.   Patient stated "I thought you were going to call me until next month," but was agreeable with brief conversation today. Patient stated he has been doing ok. He did stated he is still having difficulty with pain in throat area that makes it hard to talk and eat. Stated that he has an appointment on Monday to talk to the doctor about. Patient has no other needs or concerns at present. He was agreeable with scheduling a home visit and stated that he has RNCM number and will call if he needs anything.  Plan: Home visit scheduled in next 2 weeks.  [place] R. Dejae Bernet, RN, BSN, [place] Management Coordinator [phone]

## 2016-11-13 ENCOUNTER — Encounter: Payer: Self-pay | Admitting: Hematology and Oncology

## 2016-11-13 ENCOUNTER — Ambulatory Visit (HOSPITAL_BASED_OUTPATIENT_CLINIC_OR_DEPARTMENT_OTHER): Payer: Medicare Other | Admitting: Hematology and Oncology

## 2016-11-13 ENCOUNTER — Ambulatory Visit: Payer: Medicare Other

## 2016-11-13 ENCOUNTER — Encounter (HOSPITAL_COMMUNITY): Payer: Self-pay | Admitting: *Deleted

## 2016-11-13 ENCOUNTER — Inpatient Hospital Stay (HOSPITAL_COMMUNITY)
Admission: EM | Admit: 2016-11-13 | Discharge: 2016-11-19 | DRG: 640 | Disposition: A | Discharge status: Home Health | Payer: Medicare Other | Attending: Internal Medicine | Admitting: Internal Medicine

## 2016-11-13 ENCOUNTER — Inpatient Hospital Stay (HOSPITAL_COMMUNITY): Payer: Medicare Other

## 2016-11-13 ENCOUNTER — Other Ambulatory Visit (HOSPITAL_BASED_OUTPATIENT_CLINIC_OR_DEPARTMENT_OTHER): Payer: Medicare Other

## 2016-11-13 VITALS — BP 141/72 | HR 95 | Temp 99.1°F | Resp 17 | Ht 63.0 in | Wt 128.9 lb

## 2016-11-13 DIAGNOSIS — Z7189 Other specified counseling: Secondary | ICD-10-CM | POA: Diagnosis not present

## 2016-11-13 DIAGNOSIS — K219 Gastro-esophageal reflux disease without esophagitis: Secondary | ICD-10-CM | POA: Diagnosis not present

## 2016-11-13 DIAGNOSIS — G893 Neoplasm related pain (acute) (chronic): Secondary | ICD-10-CM | POA: Diagnosis present

## 2016-11-13 DIAGNOSIS — R634 Abnormal weight loss: Secondary | ICD-10-CM

## 2016-11-13 DIAGNOSIS — R4702 Dysphasia: Secondary | ICD-10-CM | POA: Diagnosis present

## 2016-11-13 DIAGNOSIS — Z923 Personal history of irradiation: Secondary | ICD-10-CM

## 2016-11-13 DIAGNOSIS — C770 Secondary and unspecified malignant neoplasm of lymph nodes of head, face and neck: Secondary | ICD-10-CM

## 2016-11-13 DIAGNOSIS — Z833 Family history of diabetes mellitus: Secondary | ICD-10-CM | POA: Diagnosis not present

## 2016-11-13 DIAGNOSIS — C799 Secondary malignant neoplasm of unspecified site: Secondary | ICD-10-CM | POA: Diagnosis not present

## 2016-11-13 DIAGNOSIS — Z931 Gastrostomy status: Secondary | ICD-10-CM

## 2016-11-13 DIAGNOSIS — Z515 Encounter for palliative care: Secondary | ICD-10-CM | POA: Diagnosis not present

## 2016-11-13 DIAGNOSIS — R1310 Dysphagia, unspecified: Secondary | ICD-10-CM | POA: Diagnosis not present

## 2016-11-13 DIAGNOSIS — J449 Chronic obstructive pulmonary disease, unspecified: Secondary | ICD-10-CM | POA: Diagnosis present

## 2016-11-13 DIAGNOSIS — D638 Anemia in other chronic diseases classified elsewhere: Secondary | ICD-10-CM | POA: Diagnosis present

## 2016-11-13 DIAGNOSIS — Z9221 Personal history of antineoplastic chemotherapy: Secondary | ICD-10-CM

## 2016-11-13 DIAGNOSIS — C321 Malignant neoplasm of supraglottis: Secondary | ICD-10-CM

## 2016-11-13 DIAGNOSIS — I10 Essential (primary) hypertension: Secondary | ICD-10-CM | POA: Diagnosis not present

## 2016-11-13 DIAGNOSIS — E46 Unspecified protein-calorie malnutrition: Secondary | ICD-10-CM | POA: Diagnosis not present

## 2016-11-13 DIAGNOSIS — C4492 Squamous cell carcinoma of skin, unspecified: Secondary | ICD-10-CM | POA: Diagnosis not present

## 2016-11-13 DIAGNOSIS — Z8249 Family history of ischemic heart disease and other diseases of the circulatory system: Secondary | ICD-10-CM

## 2016-11-13 DIAGNOSIS — C781 Secondary malignant neoplasm of mediastinum: Secondary | ICD-10-CM | POA: Diagnosis present

## 2016-11-13 DIAGNOSIS — Z800 Family history of malignant neoplasm of digestive organs: Secondary | ICD-10-CM

## 2016-11-13 DIAGNOSIS — E43 Unspecified severe protein-calorie malnutrition: Secondary | ICD-10-CM | POA: Diagnosis not present

## 2016-11-13 DIAGNOSIS — R1319 Other dysphagia: Secondary | ICD-10-CM | POA: Diagnosis not present

## 2016-11-13 DIAGNOSIS — Z87891 Personal history of nicotine dependence: Secondary | ICD-10-CM

## 2016-11-13 DIAGNOSIS — IMO0002 Reserved for concepts with insufficient information to code with codable children: Secondary | ICD-10-CM

## 2016-11-13 DIAGNOSIS — Z95828 Presence of other vascular implants and grafts: Secondary | ICD-10-CM

## 2016-11-13 DIAGNOSIS — C7800 Secondary malignant neoplasm of unspecified lung: Secondary | ICD-10-CM | POA: Diagnosis not present

## 2016-11-13 DIAGNOSIS — R627 Adult failure to thrive: Secondary | ICD-10-CM | POA: Diagnosis not present

## 2016-11-13 DIAGNOSIS — J90 Pleural effusion, not elsewhere classified: Secondary | ICD-10-CM | POA: Diagnosis not present

## 2016-11-13 DIAGNOSIS — R51 Headache: Secondary | ICD-10-CM | POA: Diagnosis not present

## 2016-11-13 DIAGNOSIS — Z6822 Body mass index (BMI) 22.0-22.9, adult: Secondary | ICD-10-CM

## 2016-11-13 DIAGNOSIS — D573 Sickle-cell trait: Secondary | ICD-10-CM | POA: Diagnosis present

## 2016-11-13 DIAGNOSIS — C7801 Secondary malignant neoplasm of right lung: Secondary | ICD-10-CM | POA: Diagnosis not present

## 2016-11-13 DIAGNOSIS — Z5111 Encounter for antineoplastic chemotherapy: Secondary | ICD-10-CM | POA: Diagnosis not present

## 2016-11-13 LAB — CBC
HEMATOCRIT: 42.8 % (ref 39.0–52.0)
HEMOGLOBIN: 13.6 g/dL (ref 13.0–17.0)
MCH: 30.8 pg (ref 26.0–34.0)
MCHC: 31.8 g/dL (ref 30.0–36.0)
MCV: 96.8 fL (ref 78.0–100.0)
Platelets: 248 10*3/uL (ref 150–400)
RBC: 4.42 MIL/uL (ref 4.22–5.81)
RDW: 15.5 % (ref 11.5–15.5)
WBC: 15.4 10*3/uL — ABNORMAL HIGH (ref 4.0–10.5)

## 2016-11-13 LAB — CBC WITH DIFFERENTIAL/PLATELET
BASO%: 0.1 % (ref 0.0–2.0)
BASOS ABS: 0 10*3/uL (ref 0.0–0.1)
EOS ABS: 0.1 10*3/uL (ref 0.0–0.5)
EOS%: 1.2 % (ref 0.0–7.0)
HEMATOCRIT: 30 % — AB (ref 38.4–49.9)
HEMOGLOBIN: 9.8 g/dL — AB (ref 13.0–17.1)
LYMPH#: 0.7 10*3/uL — AB (ref 0.9–3.3)
LYMPH%: 9.3 % — ABNORMAL LOW (ref 14.0–49.0)
MCH: 27 pg — AB (ref 27.2–33.4)
MCHC: 32.7 g/dL (ref 32.0–36.0)
MCV: 82.6 fL (ref 79.3–98.0)
MONO#: 0.6 10*3/uL (ref 0.1–0.9)
MONO%: 7.4 % (ref 0.0–14.0)
NEUT%: 82 % — ABNORMAL HIGH (ref 39.0–75.0)
NEUTROS ABS: 6.2 10*3/uL (ref 1.5–6.5)
Platelets: 278 10*3/uL (ref 140–400)
RBC: 3.63 10*6/uL — ABNORMAL LOW (ref 4.20–5.82)
RDW: 15.4 % — AB (ref 11.0–14.6)
WBC: 7.6 10*3/uL (ref 4.0–10.3)

## 2016-11-13 LAB — CREATININE, SERUM: Creatinine, Ser: 1.17 mg/dL (ref 0.61–1.24)

## 2016-11-13 LAB — COMPREHENSIVE METABOLIC PANEL
ALBUMIN: 3.4 g/dL — AB (ref 3.5–5.0)
ALK PHOS: 67 U/L (ref 40–150)
ALT: 12 U/L (ref 0–55)
ANION GAP: 9 meq/L (ref 3–11)
AST: 17 U/L (ref 5–34)
BUN: 15.7 mg/dL (ref 7.0–26.0)
CALCIUM: 12.8 mg/dL — AB (ref 8.4–10.4)
CHLORIDE: 98 meq/L (ref 98–109)
CO2: 31 mEq/L — ABNORMAL HIGH (ref 22–29)
Creatinine: 1.1 mg/dL (ref 0.7–1.3)
EGFR: 89 mL/min/{1.73_m2} — AB (ref >90)
Glucose: 126 mg/dl (ref 70–140)
POTASSIUM: 3.8 meq/L (ref 3.5–5.1)
Sodium: 138 mEq/L (ref 136–145)
Total Bilirubin: 0.57 mg/dL (ref 0.20–1.20)
Total Protein: 7.2 g/dL (ref 6.4–8.3)

## 2016-11-13 LAB — MAGNESIUM: Magnesium: 2.7 mg/dl — ABNORMAL HIGH (ref 1.5–2.5)

## 2016-11-13 MED ORDER — HEPARIN SOD (PORK) LOCK FLUSH 100 UNIT/ML IV SOLN
500.0000 [IU] | Freq: Once | INTRAVENOUS | Status: AC
  Administered 2016-11-13: 500 [IU]
  Filled 2016-11-13: qty 5

## 2016-11-13 MED ORDER — GABAPENTIN 300 MG PO CAPS
300.0000 mg | ORAL_CAPSULE | Freq: Three times a day (TID) | ORAL | Status: DC
  Administered 2016-11-13 – 2016-11-19 (×17): 300 mg via ORAL
  Filled 2016-11-13 (×18): qty 1

## 2016-11-13 MED ORDER — ONDANSETRON HCL 4 MG/2ML IJ SOLN: 4.0000 mg | Freq: Four times a day (QID) | INTRAMUSCULAR | Status: DC | PRN

## 2016-11-13 MED ORDER — SODIUM CHLORIDE 0.9 % IV BOLUS (SEPSIS)
1000.0000 mL | Freq: Once | INTRAVENOUS | Status: AC
  Administered 2016-11-13: 1000 mL via INTRAVENOUS

## 2016-11-13 MED ORDER — IOPAMIDOL (ISOVUE-300) INJECTION 61%
INTRAVENOUS | Status: AC
  Administered 2016-11-13: 100 mL
  Filled 2016-11-13: qty 100

## 2016-11-13 MED ORDER — SODIUM CHLORIDE 0.9 % IV SOLN
INTRAVENOUS | Status: DC
  Administered 2016-11-13 – 2016-11-18 (×7): via INTRAVENOUS

## 2016-11-13 MED ORDER — MORPHINE SULFATE 15 MG PO TABS
15.0000 mg | ORAL_TABLET | Freq: Four times a day (QID) | ORAL | Status: DC | PRN
  Administered 2016-11-14: 15 mg via ORAL
  Filled 2016-11-13: qty 1

## 2016-11-13 MED ORDER — SODIUM CHLORIDE 0.9% FLUSH
10.0000 mL | Freq: Once | INTRAVENOUS | Status: AC
  Administered 2016-11-13: 10 mL
  Filled 2016-11-13: qty 10

## 2016-11-13 MED ORDER — FUROSEMIDE 10 MG/ML IJ SOLN
20.0000 mg | Freq: Every day | INTRAMUSCULAR | Status: DC
  Administered 2016-11-13 – 2016-11-18 (×6): 20 mg via INTRAVENOUS
  Filled 2016-11-13 (×6): qty 2

## 2016-11-13 MED ORDER — ONDANSETRON HCL 4 MG PO TABS: 4.0000 mg | ORAL_TABLET | Freq: Four times a day (QID) | ORAL | Status: DC | PRN

## 2016-11-13 MED ORDER — ENOXAPARIN SODIUM 40 MG/0.4ML ~~LOC~~ SOLN
40.0000 mg | SUBCUTANEOUS | Status: DC
  Administered 2016-11-14 – 2016-11-18 (×5): 40 mg via SUBCUTANEOUS
  Filled 2016-11-13 (×6): qty 0.4

## 2016-11-13 MED ORDER — CYCLOBENZAPRINE HCL 10 MG PO TABS
10.0000 mg | ORAL_TABLET | Freq: Every day | ORAL | Status: DC
  Administered 2016-11-13 – 2016-11-18 (×6): 10 mg via ORAL
  Filled 2016-11-13 (×6): qty 1

## 2016-11-13 MED ORDER — DEXAMETHASONE SODIUM PHOSPHATE 4 MG/ML IJ SOLN
2.0000 mg | Freq: Two times a day (BID) | INTRAMUSCULAR | Status: DC
  Administered 2016-11-13 – 2016-11-14 (×3): 2 mg via INTRAVENOUS
  Filled 2016-11-13 (×4): qty 0.5

## 2016-11-13 MED ORDER — DIPHENHYDRAMINE HCL 50 MG/ML IJ SOLN: 12.5000 mg | Freq: Four times a day (QID) | INTRAMUSCULAR | Status: DC | PRN

## 2016-11-13 NOTE — Assessment & Plan Note (Signed)
I am very concerned about possible disease recurrence He has worsening malignant hypercalcemia He has palpable mass in the chin area I recommend admission to the hospital for treatment of malignant hypercalcemia and repeat imaging with CT scan of the head and neck with contrast and CT scan of the chest to exclude metastatic cancer.  He agreed to be admitted Unfortunately, due to lack of beds, the patient is directed to the emergency department instead

## 2016-11-13 NOTE — ED Triage Notes (Signed)
[facility] d/t hyperCa+ 12.8.  RN from [place] reports pt was there for a routine f/u.  Pt reports throat pain, worse with swallowing.  States he can hardly eat.  Pt is A&Ox4.  Pt's nurse reported that D.r [name] was going to direct admit pt but was told that there is a hold on a bed.

## 2016-11-13 NOTE — ED Provider Notes (Signed)
[facility] DEPT Provider Note   CSN: [id number] Arrival date & time: 11/13/16  1602     History   Chief Complaint Chief Complaint  Patient presents with  . Abnormal Lab  . throat pain    HPI Antonio [name] is a 61 y.o. male.  Pt presents to the ED today from Dr. Calton Dach office.  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  He had subsequent radiation and chemotherapy.  The pt presented back to [name] today as a follow up visit for dysphagia, weight loss, and hypercalcemia.  Pt has not been able to eat or drink well and has been unable to afford the food supplements.  The pt's repeat calcium today was more than last week, so she recommended that he come into the hospital.  She tried to do a direct admit, but due to the lack of beds, he had to come to the ED.  The pt does take morphine for his chronic pain, and he is not in much pain now.  He does complain of being unable to swallow.         Past Medical History:  Diagnosis Date  . Allergy    allergic rhinitis  . Arthritis    "qwhere" (05/17/2016)  . Chronic cough   . Chronic sinus infection   . Colonic polyp   . COPD (chronic obstructive pulmonary disease) ([place])   . 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 Dysphagia    normal EGD except for mild gastritis in 01/2006  . GERD (gastroesophageal reflux disease)   . Head and neck malignancy ([place]) 05/18/2016  . High cholesterol   . History of radiation therapy 06/28/16- 08/17/16   Larynx 70 Gy in 35 fractions to gross disease, 63 Gy in 35 fractions to high risk nodal echelons, and 56 Gy in 35 fractions to intermediate risk nodal echelons  . History of radiation therapy 06/28/16- 08/17/16   Right Lung 60 Gy in 5 fractions--- given at same time as Larynx radiation  . Hypertension   . Rectal bleeding    colonoscopy in 01/2006  . Rectal polyp    01/2006 with  path showing it to be benign, though with a question that it was incomplete biopsy, next scheduled coloscopy 2010  . Sickle cell trait ([place])   . Tobacco abuse   . Varus deformity of knee    bilateral, congenital    Patient Active Problem List   Diagnosis Date Noted  . Hypercalcemia 11/13/2016  . Hypercalcemia of malignancy 11/07/2016  . Port catheter in place 11/06/2016  . Acquired lymphedema 09/15/2016  . Anemia, chronic disease 08/30/2016  . Prerenal acute renal failure ([place]) 08/26/2016  . Pancytopenia, acquired ([place]) 07/27/2016  . Nausea without vomiting 07/27/2016  . Other constipation 07/20/2016  . 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 Malignant neoplasm of lower lobe of right lung ([place]) 06/28/2016  . Cancer associated pain 05/31/2016  . Weight loss 05/31/2016  . Dysarthria 05/31/2016  . 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[place]) 05/18/2016  . Sciatic leg pain 02/24/2015  . Preventative health care 12/01/2014  . Hyperlipidemia 02/20/2013  . Varus deformity of knee 06/16/2011  . COLONIC POLYPS 03/14/2006  . Allergic rhinitis 01/19/2006  . COPD 01/19/2006  . Essential hypertension 01/11/2006  . Osteoarthrosis involving lower leg 01/11/2006    Past Surgical History:  Procedure Laterality Date  .  COLONOSCOPY W/ BIOPSIES AND POLYPECTOMY  X 2  . IR FLUORO GUIDE PORT INSERTION RIGHT  06/23/2016  . IR GASTROSTOMY TUBE MOD SED  06/23/2016  . IR US GUIDE VASC ACCESS RIGHT  06/23/2016  . MULTIPLE TOOTH EXTRACTIONS  ~ 2008   "took them all out"       Home Medications    Prior to Admission medications   Medication Sig Start Date End Date Taking? Authorizing Provider  cyclobenzaprine (FLEXERIL) 10 MG tablet TAKE ONE TABLET BY MOUTH AT BEDTIME Patient taking differently: TAKE ONE TABLET BY MOUTH AT BEDTIME PRN FOR SPASMS 10/19/16  Yes Maryellen Pile, MD  gabapentin (NEURONTIN) 300 MG capsule Take 1 capsule (300 mg total) by mouth 3 (three) times daily. 08/24/16  Yes  Gorsuch, Ni, MD  morphine (MSIR) 15 MG tablet Take 1 tablet (15 mg total) by mouth every 6 (six) hours as needed for severe pain. 11/02/16  Yes Wyatt Portela, MD  EQ ALLERGY RELIEF 10 MG tablet TAKE ONE TABLET BY MOUTH ONCE DAILY Patient not taking: Reported on 11/13/2016 07/17/16   Maryellen Pile, MD  lidocaine-prilocaine (EMLA) cream Apply to affected area once Patient not taking: Reported on 10/18/2016 06/14/16   [name], MD  Nutritional Supplements (FEEDING SUPPLEMENT, OSMOLITE 1.5 CAL,) LIQD Begin Osmolite 1.5 via PEG TID with 60 mL free water. Advance as tolerated to goal of 1 1/2 bottles QID with 60 mL free water before and after. In addition, drink or flush PEG with 240 mL free water TID. Patient not taking: Reported on 11/13/2016 07/05/16   [name], MD  ondansetron (ZOFRAN) 8 MG tablet Take 1 tablet (8 mg total) by mouth 2 (two) times daily as needed. Start on the third day after chemotherapy. Patient not taking: Reported on 10/18/2016 06/14/16   [name], MD  prochlorperazine (COMPAZINE) 10 MG tablet Take 1 tablet (10 mg total) by mouth every 6 (six) hours as needed (Nausea or vomiting). Patient not taking: Reported on 10/18/2016 06/14/16   [name], MD  senna-docusate (SENOKOT-S) 8.6-50 MG tablet Take 1 tablet by mouth 2 (two) times daily. Patient not taking: Reported on 11/13/2016 06/14/16   [name], MD    Family History Family History  Problem Relation Age of Onset  . Cancer Mother        gastric ca  . Hypertension Father   . Diabetes Father   . Diabetes Sister   . CAD Sister     Social History Social History  Substance Use Topics  . Smoking status: Former Smoker    Packs/day: 0.12    Years: 43.00    Types: Cigarettes  . Smokeless tobacco: Never Used     Comment: cuting back  . Alcohol use 14.4 oz/week    24 Cans of beer per week     Comment: he tells me he has decreased. He is drinking 6-8 cans of beer a week.      Allergies   Patient has no known  allergies.   Review of Systems Review of Systems  HENT: Positive for sore throat and trouble swallowing.   All other systems reviewed and are negative.    Physical Exam Updated Vital Signs BP 127/86   Pulse 90   Temp 99 F (37.2 C) (Oral)   Resp (!) 24   SpO2 98%   Physical Exam  Constitutional: He is oriented to person, place, and time. He appears well-developed. He appears cachectic.  HENT:  Head: Normocephalic and atraumatic.  Right  Ear: External ear normal.  Left Ear: External ear normal.  Nose: Nose normal.  Mouth/Throat: Mucous membranes are dry.  Palpable mass to chin  Eyes: Pupils are equal, round, and reactive to light. Conjunctivae and EOM are normal.  Neck: Normal range of motion.  Cardiovascular: Normal rate, regular rhythm, normal heart sounds and intact distal pulses.   Pulmonary/Chest: Effort normal and breath sounds normal.  Abdominal: Soft. Bowel sounds are normal.  Feeding tube in place  Musculoskeletal: He exhibits edema.  Neurological: He is alert and oriented to person, place, and time.  Skin: Skin is warm.  Psychiatric: He has a normal mood and affect. His behavior is normal. Judgment and thought content normal.  Nursing note and vitals reviewed.    ED Treatments / Results  Labs (all labs ordered are listed, but only abnormal results are displayed) Labs Reviewed - No data to display  EKG  EKG Interpretation None       Radiology Ct Head W Or Wo Contrast  Result Date: 11/13/2016 CLINICAL DATA:  Worsening throat pain and dysphagia. Supraglottic laryngeal cancer. Status post radiation and chemotherapy. EXAM: CT HEAD WITHOUT AND WITH CONTRAST CT NECK WITH CONTRAST TECHNIQUE: Contiguous axial images were obtained from the base of the skull through the vertex without and with intravenous contrast. Multidetector CT imaging of the and neck was performed using the standard protocol following the bolus administration of intravenous contrast.  CONTRAST:  123mL ISOVUE-300 IOPAMIDOL (ISOVUE-300) INJECTION 61% COMPARISON:  PET CT 06/08/2016 FINDINGS: CT HEAD FINDINGS Brain: No mass lesion, intraparenchymal hemorrhage or extra-axial collection. No evidence of acute cortical infarct. Brain parenchyma and CSF-containing spaces are normal for age. No contrast-enhancing lesions. Vascular: No hyperdense vessel or unexpected calcification. Skull: Normal visualized skull base, calvarium and extracranial soft tissues. Sinuses/Orbits: No sinus fluid levels or advanced mucosal thickening. No mastoid effusion. Normal orbits. CT NECK FINDINGS Pharynx and larynx: --Nasopharynx: Fossae of Rosenmuller are clear. Normal adenoid tonsils for age. --Oral cavity and oropharynx: The palatine and lingual tonsils are normal. The visible oral cavity and floor of mouth are normal. --Hypopharynx: There is hypertrophy of the lingual tonsils. --Larynx: The epiglottis is thickened, compatible with history of radiation treatment. There is circumferential soft tissue thickening within the larynx. This is greatest at the posterior left aspect. --Retropharyngeal space: No abscess, effusion or lymphadenopathy. Salivary glands: --Parotid: No mass lesion or inflammation. No sialolithiasis or ductal dilatation. --Submandibular: Symmetric without inflammation. No sialolithiasis or ductal dilatation. --Sublingual: Normal. No ranula or other visible lesion of the base of tongue and floor of mouth. Thyroid: Normal. Lymph nodes: There is confluent lymphadenopathy at left level 2, encasing the proximal internal and external carotid arteries. This appears unchanged compared to the prior PET CT. Right level 5A node measures 9 mm. Vascular: There is aortic atherosclerotic calcification. There is mixed calcified and noncalcified plaque within both common carotid arteries and at the carotid bifurcations. The there is an accessed right chest wall Port-A-Cath with its tip coursing beyond the field of view.  Skeleton: No bony spinal canal stenosis. No lytic or blastic lesions. Upper chest: Please see dedicated report for chest CT. Other: None. IMPRESSION: 1. No acute intracranial abnormality. No contrast-enhancing brain lesions. 2. Unchanged appearance of circumferential supraglottic laryngeal soft tissue thickening and confluent, masslike adenopathy at left level IIa compared to PET CT of 06/08/2016. 3. Thickening of the epiglottis is compatible with radiation treatment. 4. Please see report for dedicated chest CT for discussion of findings below the thoracic inlet. Electronically  Signed   By: Ulyses Jarred M.D.   On: 11/13/2016 18:50   Ct Soft Tissue Neck W Contrast  Result Date: 11/13/2016 CLINICAL DATA:  Worsening throat pain and dysphagia. Supraglottic laryngeal cancer. Status post radiation and chemotherapy. EXAM: CT HEAD WITHOUT AND WITH CONTRAST CT NECK WITH CONTRAST TECHNIQUE: Contiguous axial images were obtained from the base of the skull through the vertex without and with intravenous contrast. Multidetector CT imaging of the and neck was performed using the standard protocol following the bolus administration of intravenous contrast. CONTRAST:  186mL ISOVUE-300 IOPAMIDOL (ISOVUE-300) INJECTION 61% COMPARISON:  PET CT 06/08/2016 FINDINGS: CT HEAD FINDINGS Brain: No mass lesion, intraparenchymal hemorrhage or extra-axial collection. No evidence of acute cortical infarct. Brain parenchyma and CSF-containing spaces are normal for age. No contrast-enhancing lesions. Vascular: No hyperdense vessel or unexpected calcification. Skull: Normal visualized skull base, calvarium and extracranial soft tissues. Sinuses/Orbits: No sinus fluid levels or advanced mucosal thickening. No mastoid effusion. Normal orbits. CT NECK FINDINGS Pharynx and larynx: --Nasopharynx: Fossae of Rosenmuller are clear. Normal adenoid tonsils for age. --Oral cavity and oropharynx: The palatine and lingual tonsils are normal. The visible  oral cavity and floor of mouth are normal. --Hypopharynx: There is hypertrophy of the lingual tonsils. --Larynx: The epiglottis is thickened, compatible with history of radiation treatment. There is circumferential soft tissue thickening within the larynx. This is greatest at the posterior left aspect. --Retropharyngeal space: No abscess, effusion or lymphadenopathy. Salivary glands: --Parotid: No mass lesion or inflammation. No sialolithiasis or ductal dilatation. --Submandibular: Symmetric without inflammation. No sialolithiasis or ductal dilatation. --Sublingual: Normal. No ranula or other visible lesion of the base of tongue and floor of mouth. Thyroid: Normal. Lymph nodes: There is confluent lymphadenopathy at left level 2, encasing the proximal internal and external carotid arteries. This appears unchanged compared to the prior PET CT. Right level 5A node measures 9 mm. Vascular: There is aortic atherosclerotic calcification. There is mixed calcified and noncalcified plaque within both common carotid arteries and at the carotid bifurcations. The there is an accessed right chest wall Port-A-Cath with its tip coursing beyond the field of view. Skeleton: No bony spinal canal stenosis. No lytic or blastic lesions. Upper chest: Please see dedicated report for chest CT. Other: None. IMPRESSION: 1. No acute intracranial abnormality. No contrast-enhancing brain lesions. 2. Unchanged appearance of circumferential supraglottic laryngeal soft tissue thickening and confluent, masslike adenopathy at left level IIa compared to PET CT of 06/08/2016. 3. Thickening of the epiglottis is compatible with radiation treatment. 4. Please see report for dedicated chest CT for discussion of findings below the thoracic inlet. Electronically Signed   By: Ulyses Jarred M.D.   On: 11/13/2016 18:50   Ct Chest W Contrast  Result Date: 11/13/2016 CLINICAL DATA:  History of head and neck cancer with throat pain and dysphagia EXAM: CT  CHEST WITH CONTRAST TECHNIQUE: Multidetector CT imaging of the chest was performed during intravenous contrast administration. CONTRAST:  157mL ISOVUE-300 IOPAMIDOL (ISOVUE-300) INJECTION 61% COMPARISON:  PET-CT 06/08/2016 FINDINGS: Cardiovascular: Nonaneurysmal aorta. Atherosclerotic vascular calcifications. Coronary artery calcification. Central venous catheter tip terminates at the cavoatrial junction. Normal heart size. No significant pericardial effusion. Mediastinum/Nodes: Since the previous PET-CT, development of multiple enlarged mediastinal lymph nodes. Right paratracheal lymph node measures 32 x 22 cm. AP window lymph node measures 16 mm. subcarinal lymph node measures 23 mm. A lymph node anterior to the pericardium measures 6 mm. Development of bilateral hilar adenopathy, measuring 15 mm on the right and 17 mm  on the left. Midline trachea.  No thyroid mass.  Esophagus within normal limits. Lungs/Pleura: Development of a small right-sided pleural effusion. Suspect increased size of a right lower lobe lung mass but increased consolidation in the right lower lobe makes assessment difficult. Mass measures approximately 2.6 cm, compared with 1.9 cm previously. Development of multiple bilateral ill-defined pulmonary nodules within the upper lobes and bilateral lower lobes. Patchy consolidation within the right lower lobe. Upper Abdomen: Subcentimeter hypodense liver foci too small to further characterize. Subcentimeter low-density lesion mid to upper left kidney. Musculoskeletal: No acute or suspicious bone lesions IMPRESSION: 1. Interim development of multiple enlarged mediastinal and hilar lymph nodes consistent with metastatic disease. 2. Development of multiple bilateral pulmonary nodules, also suspicious for metastatic disease. Suspect that the previously noted right lower lobe lung mass has increased in size, now measuring about 26 mm compared with 19 mm previously. 3. New small right-sided pleural  effusion. Patchy consolidations in the right lower lobe, uncertain if this represents pneumonia, aspiration, or metastatic nodules. 4. Subcentimeter hypodense foci in the liver, too small to further characterize. When the patient is clinically stable and able to follow directions and hold their breath (preferably as an outpatient) further evaluation with dedicated abdominal MRI may be considered. Aortic Atherosclerosis (ICD10-I70.0). Electronically Signed   By: Donavan Foil M.D.   On: 11/13/2016 18:59    Procedures Procedures (including critical care time)  Medications Ordered in ED Medications  diphenhydrAMINE (BENADRYL) injection 12.5 mg (not administered)  sodium chloride 0.9 % bolus 1,000 mL (1,000 mLs Intravenous New Bag/Given 11/13/16 1727)  iopamidol (ISOVUE-300) 61 % injection (100 mLs  Contrast Given 11/13/16 1806)     Initial Impression / Assessment and Plan / ED Course  I have reviewed the triage vital signs and the nursing notes.  Pertinent labs & imaging results that were available during my care of the patient were reviewed by me and considered in my medical decision making (see chart for details).     Pt d/w Dr. Doyle Askew (triad) for admission.  Final Clinical Impressions(s) / ED Diagnoses   Final diagnoses:  Hypercalcemia of malignancy  Metastatic squamous cell carcinoma (HCC)  Failure to thrive in adult  Dysphagia, unspecified type    New Prescriptions New Prescriptions   No medications on file     Isla Pence, MD 11/13/16 1933

## 2016-11-13 NOTE — Therapy (Signed)
[facility] [address] [place], [place], [address] Phone: [phone]   Fax:  [phone]  Patient Details  Name: Antonio [name] MRN: [id number] Date of Birth: 09-Aug-1955 Referring Provider:  [name], MD  Encounter Date: 11/13/2016  At 1543, SLP went to look for pt in [facility], as he had not been arrived for ST follow up. SLP found patient in Med Onc clinic area, and pt was about to be wheeled to ER for admission to hospital.  Because of this, SLP cancelled ST session for today and will follow ASAP - possibly 11-27-16.  [facility] ,[place], [place]  11/13/2016, 4:00 PM  [facility] [address] [place], [place], [address] Phone: [phone]   Fax:  [phone]

## 2016-11-13 NOTE — ED Notes (Signed)
Bed: WA03 Expected date:  Expected time:  Means of arrival:  Comments: Pt from cancer center

## 2016-11-13 NOTE — Assessment & Plan Note (Signed)
This is likely anemia of chronic disease. The patient denies recent history of bleeding such as epistaxis, hematuria or hematochezia. He is asymptomatic from the anemia. We will observe for now.  

## 2016-11-13 NOTE — Assessment & Plan Note (Signed)
He has worsening cancer pain in his throat I am concerned about recurrence of disease I recommend urgent imaging study and treatment of hypercalcemia He would benefit from palliative care consult for pain management while hospitalized

## 2016-11-13 NOTE — Progress Notes (Signed)
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 LEFT neck lymphadenopathy including LEFT level IIa to level 3 4.7 x 4.6 x 7.6 cm necrotic nodal conglomeration with suspected extracapsular spread of disease. Moderately narrowed hypopharynx.       05/18/2016 Procedure    Technically successful ultrasound-guided core biopsy of left cervical adenopathy.       05/18/2016 Pathology Results    Lymph node, needle/core biopsy, L cerv LAN - SQUAMOUS CELL CARCINOMA, SEE COMMENT. Microscopic Comment Residual lymph node is not seen. There is tumor invading skeletal muscle. p16 weakly positive      06/06/2016 Procedure    He had dental extraction      06/08/2016 PET scan    1. 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 2. Hypermetabolic right lower lobe pulmonary nodule is favored to represent a synchronous primary bronchogenic neoplasm. Isolated pulmonary metastasis could look similar. No thoracic nodal hypermetabolism. 3. Soft tissue hypermetabolism about the right iliac bone and greater trochanter of the proximal right femur are favored to be degenerative or posttraumatic. Soft tissue  metastasis felt highly unlikely. 4.  Coronary artery atherosclerosis. Aortic atherosclerosis. 5. A tiny right lower lobe pulmonary nodule is most likely a subpleural lymph node.      06/15/2016 Procedure    He has near normal baseline hearing test      06/23/2016 Procedure    Ultrasound and fluoroscopically guided right internal jugular single lumen power port catheter insertion. Tip in the SVC/RA junction. Catheter ready for use      06/23/2016 Procedure    Fluoroscopic insertion of a 20-French "pull-through" gastrostomy.      06/28/2016 - 08/17/2016 Radiation Therapy    Radiation treatment dates:   06/28/16 - 08/17/16  Site/dose:     1) Larynx and neck / 70 Gy in 35 fractions to gross disease, 63 Gy in 35 fractions to high risk nodal echelons, and 56 Gy in 35 fractions to intermediate risk nodal echelons                         2) Right Lower Lung treated to 54 Gy in 3 fractions  Beams/energy:   1)  IMRT // 6 MV photons                              2) SBRT/SRT-VMAT // 6X-FFF        06/30/2016 - 08/11/2016 Chemotherapy    He received chemotherapy with high dose cisplatin       INTERVAL HISTORY: Please see below for problem oriented charting. He returns for further follow-up and management of pain He complained of severe throat pain He was on 15 mg IR morphine as needed  and this is not controlling his pain He have swallowing difficulties and complaint of swelling in his neck beneath the chin area He  denies choking sensation No new lymphadenopathy Denies cough, chest pain or shortness of breath No nausea or vomiting He has lost some weight since last time I saw him  REVIEW OF SYSTEMS:   Constitutional: Denies fevers, chills or abnormal weight loss Eyes: Denies blurriness of vision Ears, nose, mouth, throat, and face: Denies mucositis or sore throat Respiratory: Denies cough, dyspnea or wheezes Cardiovascular: Denies palpitation, chest discomfort or lower extremity  swelling Gastrointestinal:  Denies nausea, heartburn or change in bowel habits Skin: Denies abnormal skin rashes Lymphatics: Denies new lymphadenopathy or easy bruising Neurological:Denies numbness, tingling or new weaknesses Behavioral/Psych: Mood is stable, no new changes  All other systems were reviewed with the patient and are negative.  I have reviewed the past medical history, past surgical history, social history and family history with the patient and they are unchanged from previous note.  ALLERGIES:  has No Known Allergies.  MEDICATIONS:  Current Outpatient Prescriptions  Medication Sig Dispense Refill  . cyclobenzaprine (FLEXERIL) 10 MG tablet TAKE ONE TABLET BY MOUTH AT BEDTIME 30 tablet 1  . EQ ALLERGY RELIEF 10 MG tablet TAKE ONE TABLET BY MOUTH ONCE DAILY (Patient not taking: Reported on 10/18/2016) 90 tablet 3  . gabapentin (NEURONTIN) 300 MG capsule Take 1 capsule (300 mg total) by mouth 3 (three) times daily. 90 capsule 3  . lidocaine-prilocaine (EMLA) cream Apply to affected area once (Patient not taking: Reported on 10/18/2016) 30 g 3  . morphine (MSIR) 15 MG tablet Take 1 tablet (15 mg total) by mouth every 6 (six) hours as needed for severe pain. 60 tablet 0  . Nutritional Supplements (FEEDING SUPPLEMENT, OSMOLITE 1.5 CAL,) LIQD Begin Osmolite 1.5 via PEG TID with 60 mL free water. Advance as tolerated to goal of 1 1/2 bottles QID with 60 mL free water before and after. In addition, drink or flush PEG with 240 mL free water TID. 6 Bottle 11  . ondansetron (ZOFRAN) 8 MG tablet Take 1 tablet (8 mg total) by mouth 2 (two) times daily as needed. Start on the third day after chemotherapy. (Patient not taking: Reported on 10/18/2016) 30 tablet 1  . prochlorperazine (COMPAZINE) 10 MG tablet Take 1 tablet (10 mg total) by mouth every 6 (six) hours as needed (Nausea or vomiting). (Patient not taking: Reported on 10/18/2016) 30 tablet 1  . senna-docusate (SENOKOT-S) 8.6-50 MG tablet Take 1  tablet by mouth 2 (two) times daily. 60 tablet 3   No current facility-administered medications for this visit.     PHYSICAL EXAMINATION: ECOG PERFORMANCE STATUS: 1 - Symptomatic but completely ambulatory  Vitals:   11/13/16 1515  BP: (!) 141/72  Pulse: 95  Resp: 17  Temp: 99.1 F (37.3 C)  SpO2: 96%   Filed Weights   11/13/16 1515  Weight: 128 lb 14.4 oz (58.5 kg)    GENERAL:alert, no distress and comfortable SKIN: skin color, texture, turgor are normal, no rashes or significant lesions EYES: normal, Conjunctiva are pink and non-injected, sclera clear OROPHARYNX:no exudate, no erythema and lips, buccal mucosa, and tongue normal  NECK: Noted lymphedema under his chin with hot mass under the tongue region LYMPH:  no palpable lymphadenopathy in the cervical, axillary or inguinal LUNGS: clear to auscultation and percussion with normal breathing effort HEART: regular rate & rhythm and no murmurs and no lower extremity edema ABDOMEN:abdomen soft, non-tender and  normal bowel sounds Musculoskeletal:no cyanosis of digits and no clubbing  NEURO: alert & oriented x 3 with dysarthria, no focal motor/sensory deficits  LABORATORY DATA:  I have reviewed the data as listed    Component Value Date/Time   NA 138 11/13/2016 1338   K 3.8 11/13/2016 1338   CL 97 (L) 06/23/2016 0954   CO2 31 (H) 11/13/2016 1338   GLUCOSE 126 11/13/2016 1338   BUN 15.7 11/13/2016 1338   CREATININE 1.1 11/13/2016 1338   CALCIUM 12.8 (H) 11/13/2016 1338   PROT 7.2 11/13/2016 1338   ALBUMIN 3.4 (L) 11/13/2016 1338   AST 17 11/13/2016 1338   ALT 12 11/13/2016 1338   ALKPHOS 67 11/13/2016 1338   BILITOT 0.57 11/13/2016 1338   GFRNONAA >60 06/23/2016 0954   GFRAA >60 06/23/2016 0954    No results found for: SPEP, UPEP  Lab Results  Component Value Date   WBC 7.6 11/13/2016   NEUTROABS 6.2 11/13/2016   HGB 9.8 (L) 11/13/2016   HCT 30.0 (L) 11/13/2016   MCV 82.6 11/13/2016   PLT 278 11/13/2016       Chemistry      Component Value Date/Time   NA 138 11/13/2016 1338   K 3.8 11/13/2016 1338   CL 97 (L) 06/23/2016 0954   CO2 31 (H) 11/13/2016 1338   BUN 15.7 11/13/2016 1338   CREATININE 1.1 11/13/2016 1338      Component Value Date/Time   CALCIUM 12.8 (H) 11/13/2016 1338   ALKPHOS 67 11/13/2016 1338   AST 17 11/13/2016 1338   ALT 12 11/13/2016 1338   BILITOT 0.57 11/13/2016 1338       ASSESSMENT & PLAN:  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  He agreed to be admitted Unfortunately, due to lack of beds, the patient is directed to the emergency department instead  Hypercalcemia of malignancy He has malignant hypercalcemia with worsening calcium level I told him this constitute medical emergency I tried to admit him directly to the hospital but there is no beds available He is directed to the emergency department instead I recommend aggressive IV fluid resuscitation, IV Lasix and low-dose IV steroids He can also benefit from inhaled nasal calcitonin if needed If this does not improve, will consider IV Zometa as a last resort The patient has received radiation treatment recently and is at high risk of osteonecrosis of the jaw and I recommend not to proceed with IV Zometa unless necessary  Cancer associated pain He has worsening cancer pain in his throat I am concerned about recurrence of disease I recommend urgent imaging study and treatment of hypercalcemia He would benefit from palliative care consult for pain management while hospitalized  Anemia, chronic disease This is likely anemia of chronic disease. The patient denies recent history of  bleeding such as epistaxis, hematuria or hematochezia. He is asymptomatic from the anemia. We will observe for now.     No orders of the defined types were placed in this encounter.  All questions were answered. The patient knows to call the clinic with any problems, questions or concerns. No barriers to learning was detected. I spent 30 minutes counseling the patient face to face.  The total time spent in the appointment was 40 minutes and more than 50% was on counseling and review of test results     [name], MD 11/13/2016 4:13 PM

## 2016-11-13 NOTE — Assessment & Plan Note (Signed)
He has malignant hypercalcemia with worsening calcium level I told him this constitute medical emergency I tried to admit him directly to the hospital but there is no beds available He is directed to the emergency department instead I recommend aggressive IV fluid resuscitation, IV Lasix and low-dose IV steroids He can also benefit from inhaled nasal calcitonin if needed If this does not improve, will consider IV Zometa as a last resort The patient has received radiation treatment recently and is at high risk of osteonecrosis of the jaw and I recommend not to proceed with IV Zometa unless necessary

## 2016-11-13 NOTE — H&P (Signed)
History and Physical    Antonio [name] STM:[id number] DOB: [birth year] DOA: 11/13/2016  Referring MD/NP/PA: Dr. Gilford Raid   PCP: Maryellen Pile, MD   Outpatient Specialists: Oncologist [name]   Patient coming from: home   Chief Complaint: weakness, fatigue   HPI: Antonio [name] is a 61 y.o. male with known supraglottic cancer, follows with [name], s/p radiation and chemo therapy, presented from cancer center for evaluation of progressively worsening throat pain and dysphagia. Patient describes pain as throbbing, 10/10 in severity, radiating down to neck, worse with swallowing, no specific alleviating factors, associated with dysphagia and weight loss, due to poor oral intake. Patient was seen at the cancer center and was found to have high Ca level and was referred for an admission for further treatment. Pt denies chest pain or dyspnea. No specific abd or urinary concerns.   ED Course: Pt is hemodynamically stable, VSS, Ca 12.8. TRH asked to admit for further evaluation.   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[place]), throat pain and dysphagia  - with high concern of cancer recurrence especially in the setting of hypercalcemia  - also notable palpable mass in the chin area  - oncologist has recommended admission for IV hydration and repeat imaging studies - agree with admission to telemetry unit - order for CT head and neck with contrast and CT chest ordered in an effort to rule out metastatic cancer  - provide analgesia as needed   Hypercalcemia of malignancy - provide IV hydration, IV lasix and low dose IV steroids  - BMP in AM - if calcium not better, can consider nasal calcitonin   Cancer associated pain - analgesia as needed   Anemia, chronic disease - no indication for transfusion at this time - CBC in AM  Review of Systems:  Constitutional: Negative for fever, chills, diaphoresis HENT: Negative for ear pain, nosebleeds, congestion, facial swelling     Eyes: Negative for pain, discharge, redness, itching and visual disturbance.  Respiratory: Negative for cough, shortness of breath, wheezing and stridor.   Cardiovascular: Negative for chest pain, palpitations and leg swelling.  Gastrointestinal: Negative for abdominal distention.  Genitourinary: Negative for dysuria, urgency, frequency, hematuria, flank pain, decreased urine volume Musculoskeletal: Negative for back pain, joint swelling, arthralgias and gait problem.  Neurological: Negative for dizziness, tremors, seizures, syncope, facial asymmetry Hematological: Negative for adenopathy. Does not bruise/bleed easily.  Psychiatric/Behavioral: Negative for hallucinations, behavioral problems, confusion  Past Medical History:  Diagnosis Date  . Allergy    allergic rhinitis  . Arthritis    "qwhere" (05/17/2016)  . Chronic cough   . Chronic sinus infection   . Colonic polyp   . COPD (chronic obstructive pulmonary disease) ([place])   . 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 Dysphagia    normal EGD except for mild gastritis in 01/2006  . GERD (gastroesophageal reflux disease)   . Head and neck malignancy ([place]) 05/18/2016  . High cholesterol   . History of radiation therapy 06/28/16- 08/17/16   Larynx 70 Gy in 35 fractions to gross disease, 63 Gy in 35 fractions to high risk nodal echelons, and 56 Gy in 35 fractions to intermediate risk nodal echelons  . History of radiation therapy 06/28/16- 08/17/16   Right Lung 60 Gy in 5 fractions--- given at same time as Larynx radiation  . Hypertension   . Rectal bleeding    colonoscopy in 01/2006  . Rectal polyp    01/2006 with path showing it  to be benign, though with a question that it was incomplete biopsy, next scheduled coloscopy 2010  . Sickle cell trait ([place])   . Tobacco abuse   . Varus deformity of knee    bilateral, congenital    Past Surgical History:  Procedure Laterality Date  . COLONOSCOPY W/ BIOPSIES AND  POLYPECTOMY  X 2  . IR FLUORO GUIDE PORT INSERTION RIGHT  06/23/2016  . IR GASTROSTOMY TUBE MOD SED  06/23/2016  . IR US GUIDE VASC ACCESS RIGHT  06/23/2016  . MULTIPLE TOOTH EXTRACTIONS  ~ 2008   "took them all out"   Social Hx:  reports that he has quit smoking. His smoking use included Cigarettes. He has a 5.16 pack-year smoking history. He has never used smokeless tobacco. He reports that he drinks about 14.4 oz of alcohol per week . He reports that he uses drugs, including "Crack" cocaine and Marijuana.  No Known Allergies  Family History  Problem Relation Age of Onset  . Cancer Mother        gastric ca  . Hypertension Father   . Diabetes Father   . Diabetes Sister   . CAD Sister     Medication Sig  cyclobenzaprine (FLEXERIL) 10 MG tablet TAKE ONE TABLET BY MOUTH AT BEDTIME Patient taking differently: TAKE ONE TABLET BY MOUTH AT BEDTIME PRN FOR SPASMS  gabapentin (NEURONTIN) 300 MG capsule Take 1 capsule (300 mg total) by mouth 3 (three) times daily.  morphine (MSIR) 15 MG tablet Take 1 tablet (15 mg total) by mouth every 6 (six) hours as needed for severe pain.  prochlorperazine (COMPAZINE) 10 MG tablet Take 1 tablet (10 mg total) by mouth every 6 (six) hours as needed (Nausea or vomiting). Patient not taking: Reported on 10/18/2016   Physical Exam: Vitals:   11/13/16 1627 11/13/16 1630 11/13/16 1700  BP: (!) 148/80 133/81 127/86  Pulse: 87 86 90  Resp: 14 15 (!) 24  Temp: 99 F (37.2 C)    TempSrc: Oral    SpO2: 99% 97% 98%   Constitutional: NAD, calm, comfortable Vitals:   11/13/16 1627 11/13/16 1630 11/13/16 1700  BP: (!) 148/80 133/81 127/86  Pulse: 87 86 90  Resp: 14 15 (!) 24  Temp: 99 F (37.2 C)    TempSrc: Oral    SpO2: 99% 97% 98%   Eyes: PERRL, lids and conjunctivae normal ENMT: Mucous membranes are dry, palpable mass under chin Neck: normal, supple, no masses, no thyromegaly Respiratory: clear to auscultation bilaterally, no wheezing, no crackles.  Normal respiratory effort. No accessory muscle use.  Cardiovascular: Regular rate and rhythm, no murmurs / rubs / gallops. No extremity edema. 2+ pedal pulses. No carotid bruits.  Abdomen: no tenderness, no masses palpated. No hepatosplenomegaly. Bowel sounds positive.  Musculoskeletal: no clubbing / cyanosis. No joint deformity upper and lower extremities. Good ROM, no contractures. Normal muscle tone.  Skin: no rashes, lesions, ulcers. No induration Neurologic: CN 2-12 grossly intact. Sensation intact, DTR normal. Strength 5/5 in all 4.  Psychiatric: Normal judgment and insight. Alert and oriented x 3. Normal mood.   Labs on Admission: I have personally reviewed following labs and imaging studies  CBC:  Recent Labs Lab 11/13/16 1340  WBC 7.6  NEUTROABS 6.2  HGB 9.8*  HCT 30.0*  MCV 82.6  PLT 395   Basic Metabolic Panel:  Recent Labs Lab 11/13/16 1338  NA 138  K 3.8  CO2 31*  GLUCOSE 126  BUN 15.7  CREATININE 1.1  CALCIUM 12.8*  MG 2.7*   Liver Function Tests:  Recent Labs Lab 11/13/16 1338  AST 17  ALT 12  ALKPHOS 67  BILITOT 0.57  PROT 7.2  ALBUMIN 3.4*   Radiological Exams on Admission: No results found.  EKG: pending   DVT prophylaxis: Lovenox SQ  Code Status: Full  Family Communication: Pt updated at bedside Disposition Plan: to be determined  Consults called: None Admission status: Inpatient   Faye Ramsay MD [facility] Pager [phone]  If 7PM-7AM, please contact night-coverage [email or web address] Password [email or web address]  11/13/2016, 5:29 PM

## 2016-11-14 DIAGNOSIS — C7800 Secondary malignant neoplasm of unspecified lung: Secondary | ICD-10-CM

## 2016-11-14 DIAGNOSIS — R1310 Dysphagia, unspecified: Secondary | ICD-10-CM

## 2016-11-14 DIAGNOSIS — E46 Unspecified protein-calorie malnutrition: Secondary | ICD-10-CM

## 2016-11-14 DIAGNOSIS — C321 Malignant neoplasm of supraglottis: Secondary | ICD-10-CM

## 2016-11-14 DIAGNOSIS — D638 Anemia in other chronic diseases classified elsewhere: Secondary | ICD-10-CM

## 2016-11-14 DIAGNOSIS — G893 Neoplasm related pain (acute) (chronic): Secondary | ICD-10-CM

## 2016-11-14 LAB — BASIC METABOLIC PANEL WITH GFR
Anion gap: 9 (ref 5–15)
BUN: 13 mg/dL (ref 6–20)
CO2: 29 mmol/L (ref 22–32)
Calcium: 11.6 mg/dL — ABNORMAL HIGH (ref 8.9–10.3)
Chloride: 101 mmol/L (ref 101–111)
Creatinine, Ser: 0.93 mg/dL (ref 0.61–1.24)
GFR calc Af Amer: >60 mL/min (ref >60)
GFR calc non Af Amer: >60 mL/min (ref >60)
Glucose, Bld: 110 mg/dL — ABNORMAL HIGH (ref 65–99)
Potassium: 3.7 mmol/L (ref 3.5–5.1)
Sodium: 139 mmol/L (ref 135–145)

## 2016-11-14 LAB — BASIC METABOLIC PANEL

## 2016-11-14 LAB — CBC
HEMATOCRIT: 28.2 % — AB (ref 39.0–52.0)
Hemoglobin: 9.6 g/dL — ABNORMAL LOW (ref 13.0–17.0)
MCH: 27 pg (ref 26.0–34.0)
MCHC: 34 g/dL (ref 30.0–36.0)
MCV: 79.4 fL (ref 78.0–100.0)
Platelets: 303 10*3/uL (ref 150–400)
RBC: 3.55 MIL/uL — ABNORMAL LOW (ref 4.22–5.81)
RDW: 15.3 % (ref 11.5–15.5)
WBC: 8.2 10*3/uL (ref 4.0–10.5)

## 2016-11-14 MED ORDER — MORPHINE SULFATE (PF) 4 MG/ML IV SOLN
4.0000 mg | INTRAVENOUS | Status: DC | PRN
  Administered 2016-11-15 – 2016-11-16 (×2): 4 mg via INTRAVENOUS
  Filled 2016-11-14 (×2): qty 1

## 2016-11-14 MED ORDER — MORPHINE SULFATE (CONCENTRATE) 10 MG/0.5ML PO SOLN
20.0000 mg | ORAL | Status: DC | PRN
  Administered 2016-11-17 – 2016-11-18 (×4): 20 mg
  Filled 2016-11-14 (×4): qty 1

## 2016-11-14 MED ORDER — SODIUM CHLORIDE 0.9% FLUSH
10.0000 mL | INTRAVENOUS | Status: DC | PRN
  Administered 2016-11-14: 10 mL
  Administered 2016-11-15: 20 mL
  Administered 2016-11-17: 10 mL
  Filled 2016-11-14 (×3): qty 40

## 2016-11-14 MED ORDER — LIP MEDEX EX OINT
TOPICAL_OINTMENT | CUTANEOUS | Status: DC | PRN
  Administered 2016-11-14: 13:00:00 via TOPICAL

## 2016-11-14 MED ORDER — LIP MEDEX EX OINT
TOPICAL_OINTMENT | CUTANEOUS | Status: AC
  Filled 2016-11-14: qty 7

## 2016-11-14 NOTE — Progress Notes (Signed)
[facility] NOTE  Patient Care Team: Maryellen Pile, MD as PCP - General [name], MD as Consulting Physician (Hematology and Oncology) [name], MD as Attending Physician (Radiation Oncology) Leota Sauers, RN as Oncology Nurse Navigator Karie Mainland, RD as Dietitian (Nutrition) Valentino Saxon [place], [place] as Speech Language Pathologist (Speech Pathology) Jomarie Longs, PT as Physical Therapist (Physical Therapy) Kennith Center, LCSW as Social Worker [place], Bill Salinas, RN as [facility] Management  CHIEF COMPLAINTS/PURPOSE OF CONSULTATION:  Severe malignant hypercalcemia, on background history of supraglottic cancer  HISTORY OF PRESENTING ILLNESS:  The patient is well-known to me. I try to get him directly admitted to the hospital yesterday for management of malignant hypercalcemia.  Due to lack of beds, he was directed to the emergency department instead and was subsequently admitted. 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 LEFT neck lymphadenopathy including LEFT level IIa to level 3 4.7 x 4.6 x 7.6 cm necrotic nodal conglomeration with suspected extracapsular spread of disease. Moderately narrowed hypopharynx.       05/18/2016 Procedure    Technically successful ultrasound-guided core biopsy of left cervical adenopathy.       05/18/2016 Pathology Results    Lymph node, needle/core biopsy, L cerv LAN - SQUAMOUS CELL CARCINOMA, SEE COMMENT. Microscopic Comment Residual lymph node is not seen. There is tumor invading skeletal muscle. p16 weakly positive      06/06/2016 Procedure    He had dental extraction      06/08/2016 PET scan    1. Supraglottic laryngeal primary with left greater than right cervical and left  supraclavicular nodal metastasis. 2. Hypermetabolic right lower lobe pulmonary nodule is favored to represent a synchronous primary bronchogenic neoplasm. Isolated pulmonary metastasis could look similar. No thoracic nodal hypermetabolism. 3. Soft tissue hypermetabolism about the right iliac bone and greater trochanter of the proximal right femur are favored to be degenerative or posttraumatic. Soft tissue metastasis felt highly unlikely. 4.  Coronary artery atherosclerosis. Aortic atherosclerosis. 5. A tiny right lower lobe pulmonary nodule is most likely a subpleural lymph node.      06/15/2016 Procedure    He has near normal baseline hearing test      06/23/2016 Procedure    Ultrasound and fluoroscopically guided right internal jugular single lumen power port catheter insertion. Tip in the SVC/RA junction. Catheter ready for use      06/23/2016 Procedure    Fluoroscopic insertion of a 20-French "pull-through" gastrostomy.      06/28/2016 - 08/17/2016 Radiation Therapy    Radiation treatment dates:   06/28/16 - 08/17/16  Site/dose:     1) Larynx and neck / 70 Gy in 35 fractions to gross disease, 63 Gy in 35 fractions to high risk nodal echelons, and 56 Gy in 35 fractions to intermediate risk nodal echelons                         2) Right Lower Lung treated to 54 Gy in 3 fractions  Beams/energy:   1)  IMRT // 6 MV photons                              2) SBRT/SRT-VMAT //  6X-FFF        06/30/2016 - 08/11/2016 Chemotherapy    He received chemotherapy with high dose cisplatin      Since admission, he had CT scan of the head, neck and chest.  CT scan show evidence of diffuse metastatic cancer to the lung, mediastinal lymph node and possibly to the liver.  He is receiving aggressive IV fluid resuscitation.  Repeat labs are not available.  Since admission, he felt better.  Pain is better controlled. He denies chest pain, cough or shortness of breath He complained of significant dysphagia since  radiation treatment.  MEDICAL HISTORY:  Past Medical History:  Diagnosis Date  . Allergy    allergic rhinitis  . Arthritis    "qwhere" (05/17/2016)  . Chronic cough   . Chronic sinus infection   . Colonic polyp   . COPD (chronic obstructive pulmonary disease) ([place])   . 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 Dysphagia    normal EGD except for mild gastritis in 01/2006  . GERD (gastroesophageal reflux disease)   . Head and neck malignancy ([place]) 05/18/2016  . High cholesterol   . History of radiation therapy 06/28/16- 08/17/16   Larynx 70 Gy in 35 fractions to gross disease, 63 Gy in 35 fractions to high risk nodal echelons, and 56 Gy in 35 fractions to intermediate risk nodal echelons  . History of radiation therapy 06/28/16- 08/17/16   Right Lung 60 Gy in 5 fractions--- given at same time as Larynx radiation  . Hypertension   . Rectal bleeding    colonoscopy in 01/2006  . Rectal polyp    01/2006 with path showing it to be benign, though with a question that it was incomplete biopsy, next scheduled coloscopy 2010  . Sickle cell trait ([place])   . Tobacco abuse   . Varus deformity of knee    bilateral, congenital    SURGICAL HISTORY: Past Surgical History:  Procedure Laterality Date  . COLONOSCOPY W/ BIOPSIES AND POLYPECTOMY  X 2  . IR FLUORO GUIDE PORT INSERTION RIGHT  06/23/2016  . IR GASTROSTOMY TUBE MOD SED  06/23/2016  . IR US GUIDE VASC ACCESS RIGHT  06/23/2016  . MULTIPLE TOOTH EXTRACTIONS  ~ 2008   "took them all out"    SOCIAL HISTORY: Social History   Social History  . Marital status: Single    Spouse name: N/A  . Number of children: N/A  . Years of education: N/A   Occupational History  . Not on file.   Social History Main Topics  . Smoking status: Former Smoker    Packs/day: 0.12    Years: 43.00    Types: Cigarettes  . Smokeless tobacco: Never Used     Comment: cuting back  . Alcohol use 14.4 oz/week    24 Cans of beer per week      Comment: he tells me he has decreased. He is drinking 6-8 cans of beer a week.   . Drug use: Yes    Types: "Crack" cocaine, Marijuana     Comment: 05/17/2016 "quit marijuana in the 1970s til recently; started smoking to kill pain in my legs/knees; use crack very rarely"  . Sexual activity: Not Currently   Other Topics Concern  . Not on file   Social History Narrative   Currently awaiting disability.   Cell# [phone]    FAMILY HISTORY: Family History  Problem Relation Age of Onset  . Cancer Mother  gastric ca  . Hypertension Father   . Diabetes Father   . Diabetes Sister   . CAD Sister     ALLERGIES:  has No Known Allergies.  MEDICATIONS:  Current Facility-Administered Medications  Medication Dose Route Frequency Provider Last Rate Last Dose  . 0.9 %  sodium chloride infusion   Intravenous Continuous Theodis Blaze, MD 125 mL/hr at 11/13/16 2311    . cyclobenzaprine (FLEXERIL) tablet 10 mg  10 mg Oral QHS Theodis Blaze, MD   10 mg at 11/13/16 2250  . dexamethasone (DECADRON) injection 2 mg  2 mg Intravenous Q12H Theodis Blaze, MD   2 mg at 11/14/16 1113  . diphenhydrAMINE (BENADRYL) injection 12.5 mg  12.5 mg Intravenous Q6H PRN Theodis Blaze, MD      . enoxaparin (LOVENOX) injection 40 mg  40 mg Subcutaneous Q24H Mart Piggs M, MD      . furosemide (LASIX) injection 20 mg  20 mg Intravenous Daily Theodis Blaze, MD   20 mg at 11/14/16 1113  . gabapentin (NEURONTIN) capsule 300 mg  300 mg Oral TID Theodis Blaze, MD   300 mg at 11/14/16 1113  . lip balm (CARMEX) ointment   Topical PRN Theodis Blaze, MD      . morphine (MSIR) tablet 15 mg  15 mg Oral Q6H PRN Theodis Blaze, MD   15 mg at 11/14/16 1124  . ondansetron (ZOFRAN) tablet 4 mg  4 mg Oral Q6H PRN Theodis Blaze, MD       Or  . ondansetron [facility]) injection 4 mg  4 mg Intravenous Q6H PRN Theodis Blaze, MD      . sodium chloride flush (NS) 0.9 % injection 10-40 mL  10-40 mL Intracatheter PRN Theodis Blaze,  MD   10 mL at 11/14/16 9485    REVIEW OF SYSTEMS:   Constitutional: Denies fevers, chills or abnormal night sweats Eyes: Denies blurriness of vision, double vision or watery eyes Respiratory: Denies cough, dyspnea or wheezes Cardiovascular: Denies palpitation, chest discomfort or lower extremity swelling Gastrointestinal:  Denies nausea, heartburn or change in bowel habits Skin: Denies abnormal skin rashes Lymphatics: Denies new lymphadenopathy or easy bruising Neurological:Denies numbness, tingling or new weaknesses Behavioral/Psych: Mood is stable, no new changes  All other systems were reviewed with the patient and are negative.  PHYSICAL EXAMINATION: ECOG PERFORMANCE STATUS: 2 - Symptomatic, <50% confined to bed  Vitals:   11/14/16 0427 11/14/16 1412  BP: (!) 150/82 (!) 153/87  Pulse: 96 96  Resp: 18 18  Temp: 98.7 F (37.1 C) 98.9 F (37.2 C)  SpO2: 96% 99%   Filed Weights   11/14/16 1350  Weight: 128 lb (58.1 kg)    GENERAL:alert, no distress and comfortable SKIN: skin color, texture, turgor are normal, no rashes or significant lesions EYES: normal, conjunctiva are pink and non-injected, sclera clear OROPHARYNX:no exudate, no erythema and lips, buccal mucosa, and tongue normal  NECK: Neck is fibrosed from prior radiation LYMPH:  no palpable lymphadenopathy in the cervical, axillary or inguinal LUNGS: clear to auscultation and percussion with normal breathing effort HEART: regular rate & rhythm and no murmurs and no lower extremity edema ABDOMEN:abdomen soft, non-tender and normal bowel sounds Musculoskeletal:no cyanosis of digits and no clubbing  PSYCH: alert & oriented x 3 with dysarthria NEURO: no focal motor/sensory deficits  LABORATORY DATA:  I have reviewed the data as listed Lab Results  Component Value Date   WBC 15.4 (  H) 11/13/2016   HGB 13.6 11/13/2016   HCT 42.8 11/13/2016   MCV 96.8 11/13/2016   PLT 248 11/13/2016    Recent Labs   05/18/16 0359 06/23/16 0954  09/15/16 1213 11/06/16 1333 11/13/16 1338 11/13/16 2204 11/14/16 0628  NA 135 139  < > 139 139 138  --  QUESTIONABLE RESULTS, RECOMMEND RECOLLECT TO VERIFY  K 3.9 3.4*  < > 3.8 3.8 3.8  --  QUESTIONABLE RESULTS, RECOMMEND RECOLLECT TO VERIFY  CL 98* 97*  --   --   --   --   --  QUESTIONABLE RESULTS, RECOMMEND RECOLLECT TO VERIFY  CO2 27 30  < > 26 31* 31*  --  QUESTIONABLE RESULTS, RECOMMEND RECOLLECT TO VERIFY  GLUCOSE 85 116*  < > 87 124 126  --  QUESTIONABLE RESULTS, RECOMMEND RECOLLECT TO VERIFY  BUN 11 9  < > 5.6* 11.0 15.7  --  QUESTIONABLE RESULTS, RECOMMEND RECOLLECT TO VERIFY  CREATININE 0.93 1.05  < > 1.1 1.0 1.1 1.17 QUESTIONABLE RESULTS, RECOMMEND RECOLLECT TO VERIFY  CALCIUM 9.0 9.4  < > 9.2 12.0* 12.8*  --  QUESTIONABLE RESULTS, RECOMMEND RECOLLECT TO VERIFY  GFRNONAA >60 >60  --   --   --   --  >60 NOT CALCULATED  GFRAA >60 >60  --   --   --   --  >60 NOT CALCULATED  PROT  --   --   < > 6.1* 7.3 7.2  --   --   ALBUMIN  --   --   < > 3.2* 3.5 3.4*  --   --   AST  --   --   < > 13 13 17   --   --   ALT  --   --   < > 7 <6 12  --   --   ALKPHOS  --   --   < > 56 68 67  --   --   BILITOT  --   --   < > 0.56 0.61 0.57  --   --   < > = values in this interval not displayed.  RADIOGRAPHIC STUDIES: I have personally reviewed the radiological images as listed and agreed with the findings in the report. Ct Head W Or Wo Contrast  Result Date: 11/13/2016 CLINICAL DATA:  Worsening throat pain and dysphagia. Supraglottic laryngeal cancer. Status post radiation and chemotherapy. EXAM: CT HEAD WITHOUT AND WITH CONTRAST CT NECK WITH CONTRAST TECHNIQUE: Contiguous axial images were obtained from the base of the skull through the vertex without and with intravenous contrast. Multidetector CT imaging of the and neck was performed using the standard protocol following the bolus administration of intravenous contrast. CONTRAST:  163mL ISOVUE-300 IOPAMIDOL  (ISOVUE-300) INJECTION 61% COMPARISON:  PET CT 06/08/2016 FINDINGS: CT HEAD FINDINGS Brain: No mass lesion, intraparenchymal hemorrhage or extra-axial collection. No evidence of acute cortical infarct. Brain parenchyma and CSF-containing spaces are normal for age. No contrast-enhancing lesions. Vascular: No hyperdense vessel or unexpected calcification. Skull: Normal visualized skull base, calvarium and extracranial soft tissues. Sinuses/Orbits: No sinus fluid levels or advanced mucosal thickening. No mastoid effusion. Normal orbits. CT NECK FINDINGS Pharynx and larynx: --Nasopharynx: Fossae of Rosenmuller are clear. Normal adenoid tonsils for age. --Oral cavity and oropharynx: The palatine and lingual tonsils are normal. The visible oral cavity and floor of mouth are normal. --Hypopharynx: There is hypertrophy of the lingual tonsils. --Larynx: The epiglottis is thickened, compatible with history of radiation treatment. There is  circumferential soft tissue thickening within the larynx. This is greatest at the posterior left aspect. --Retropharyngeal space: No abscess, effusion or lymphadenopathy. Salivary glands: --Parotid: No mass lesion or inflammation. No sialolithiasis or ductal dilatation. --Submandibular: Symmetric without inflammation. No sialolithiasis or ductal dilatation. --Sublingual: Normal. No ranula or other visible lesion of the base of tongue and floor of mouth. Thyroid: Normal. Lymph nodes: There is confluent lymphadenopathy at left level 2, encasing the proximal internal and external carotid arteries. This appears unchanged compared to the prior PET CT. Right level 5A node measures 9 mm. Vascular: There is aortic atherosclerotic calcification. There is mixed calcified and noncalcified plaque within both common carotid arteries and at the carotid bifurcations. The there is an accessed right chest wall Port-A-Cath with its tip coursing beyond the field of view. Skeleton: No bony spinal canal  stenosis. No lytic or blastic lesions. Upper chest: Please see dedicated report for chest CT. Other: None. IMPRESSION: 1. No acute intracranial abnormality. No contrast-enhancing brain lesions. 2. Unchanged appearance of circumferential supraglottic laryngeal soft tissue thickening and confluent, masslike adenopathy at left level IIa compared to PET CT of 06/08/2016. 3. Thickening of the epiglottis is compatible with radiation treatment. 4. Please see report for dedicated chest CT for discussion of findings below the thoracic inlet. Electronically Signed   By: Ulyses Jarred M.D.   On: 11/13/2016 18:50   Ct Soft Tissue Neck W Contrast  Result Date: 11/13/2016 CLINICAL DATA:  Worsening throat pain and dysphagia. Supraglottic laryngeal cancer. Status post radiation and chemotherapy. EXAM: CT HEAD WITHOUT AND WITH CONTRAST CT NECK WITH CONTRAST TECHNIQUE: Contiguous axial images were obtained from the base of the skull through the vertex without and with intravenous contrast. Multidetector CT imaging of the and neck was performed using the standard protocol following the bolus administration of intravenous contrast. CONTRAST:  133mL ISOVUE-300 IOPAMIDOL (ISOVUE-300) INJECTION 61% COMPARISON:  PET CT 06/08/2016 FINDINGS: CT HEAD FINDINGS Brain: No mass lesion, intraparenchymal hemorrhage or extra-axial collection. No evidence of acute cortical infarct. Brain parenchyma and CSF-containing spaces are normal for age. No contrast-enhancing lesions. Vascular: No hyperdense vessel or unexpected calcification. Skull: Normal visualized skull base, calvarium and extracranial soft tissues. Sinuses/Orbits: No sinus fluid levels or advanced mucosal thickening. No mastoid effusion. Normal orbits. CT NECK FINDINGS Pharynx and larynx: --Nasopharynx: Fossae of Rosenmuller are clear. Normal adenoid tonsils for age. --Oral cavity and oropharynx: The palatine and lingual tonsils are normal. The visible oral cavity and floor of mouth  are normal. --Hypopharynx: There is hypertrophy of the lingual tonsils. --Larynx: The epiglottis is thickened, compatible with history of radiation treatment. There is circumferential soft tissue thickening within the larynx. This is greatest at the posterior left aspect. --Retropharyngeal space: No abscess, effusion or lymphadenopathy. Salivary glands: --Parotid: No mass lesion or inflammation. No sialolithiasis or ductal dilatation. --Submandibular: Symmetric without inflammation. No sialolithiasis or ductal dilatation. --Sublingual: Normal. No ranula or other visible lesion of the base of tongue and floor of mouth. Thyroid: Normal. Lymph nodes: There is confluent lymphadenopathy at left level 2, encasing the proximal internal and external carotid arteries. This appears unchanged compared to the prior PET CT. Right level 5A node measures 9 mm. Vascular: There is aortic atherosclerotic calcification. There is mixed calcified and noncalcified plaque within both common carotid arteries and at the carotid bifurcations. The there is an accessed right chest wall Port-A-Cath with its tip coursing beyond the field of view. Skeleton: No bony spinal canal stenosis. No lytic or blastic lesions. Upper chest:  Please see dedicated report for chest CT. Other: None. IMPRESSION: 1. No acute intracranial abnormality. No contrast-enhancing brain lesions. 2. Unchanged appearance of circumferential supraglottic laryngeal soft tissue thickening and confluent, masslike adenopathy at left level IIa compared to PET CT of 06/08/2016. 3. Thickening of the epiglottis is compatible with radiation treatment. 4. Please see report for dedicated chest CT for discussion of findings below the thoracic inlet. Electronically Signed   By: Ulyses Jarred M.D.   On: 11/13/2016 18:50   Ct Chest W Contrast  Result Date: 11/13/2016 CLINICAL DATA:  History of head and neck cancer with throat pain and dysphagia EXAM: CT CHEST WITH CONTRAST TECHNIQUE:  Multidetector CT imaging of the chest was performed during intravenous contrast administration. CONTRAST:  197mL ISOVUE-300 IOPAMIDOL (ISOVUE-300) INJECTION 61% COMPARISON:  PET-CT 06/08/2016 FINDINGS: Cardiovascular: Nonaneurysmal aorta. Atherosclerotic vascular calcifications. Coronary artery calcification. Central venous catheter tip terminates at the cavoatrial junction. Normal heart size. No significant pericardial effusion. Mediastinum/Nodes: Since the previous PET-CT, development of multiple enlarged mediastinal lymph nodes. Right paratracheal lymph node measures 32 x 22 cm. AP window lymph node measures 16 mm. subcarinal lymph node measures 23 mm. A lymph node anterior to the pericardium measures 6 mm. Development of bilateral hilar adenopathy, measuring 15 mm on the right and 17 mm on the left. Midline trachea.  No thyroid mass.  Esophagus within normal limits. Lungs/Pleura: Development of a small right-sided pleural effusion. Suspect increased size of a right lower lobe lung mass but increased consolidation in the right lower lobe makes assessment difficult. Mass measures approximately 2.6 cm, compared with 1.9 cm previously. Development of multiple bilateral ill-defined pulmonary nodules within the upper lobes and bilateral lower lobes. Patchy consolidation within the right lower lobe. Upper Abdomen: Subcentimeter hypodense liver foci too small to further characterize. Subcentimeter low-density lesion mid to upper left kidney. Musculoskeletal: No acute or suspicious bone lesions IMPRESSION: 1. Interim development of multiple enlarged mediastinal and hilar lymph nodes consistent with metastatic disease. 2. Development of multiple bilateral pulmonary nodules, also suspicious for metastatic disease. Suspect that the previously noted right lower lobe lung mass has increased in size, now measuring about 26 mm compared with 19 mm previously. 3. New small right-sided pleural effusion. Patchy consolidations in  the right lower lobe, uncertain if this represents pneumonia, aspiration, or metastatic nodules. 4. Subcentimeter hypodense foci in the liver, too small to further characterize. When the patient is clinically stable and able to follow directions and hold their breath (preferably as an outpatient) further evaluation with dedicated abdominal MRI may be considered. Aortic Atherosclerosis (ICD10-I70.0). Electronically Signed   By: Donavan Foil M.D.   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 Unfortunately, the patient has developed recurrent disease within 3 months of stopping treatment At this point in time, even without a biopsy, this is highly suspicious for recurrent metastatic squamous cell carcinoma from the head and neck region The patient had severe, locally advanced disease before chemoradiation therapy The patient is informed that his disease is no longer curable and any form of treatment is strictly palliative in nature With significant disease diffuse disease, even with chemotherapy, I think his prognosis is very poor I recommend family meeting tomorrow and consultation with palliative care team for goals of care discussion tomorrow In the meantime, we will provide supportive care with pain management and management of malignant hypercalcemia  Malignant hypercalcemia He is on  aggressive IV fluid resuscitation, IV Lasix and steroids Unfortunately, blood work has not been repeated We will repeat his blood work tomorrow for further follow-up If he is malignant hypercalcemia does not improve, we will initiate more aggressive measures. Will consider adding nasal calcitonin Avoid IV bisphosphonate due to risk of osteonecrosis of the jaw  Moderate to severe protein calorie malnutrition Will consult dietitian to start tube feeding  Severe cancer pain He will continue pain management with  morphine sulfate I will add availability of liquid Roxanol to be given through the feeding tube due to difficulties with swallowing and IV prn morphine for breakthrough pain  Anemia chronic illness Will observe  CODE STATUS He is currently full code We will further discuss goals of care and CODE STATUS tomorrow  Discharge planning He is not safe to be discharged until hypercalcemia resolved I will return tomorrow for family meeting  All questions were answered. The patient knows to call the clinic with any problems, questions or concerns.    [name], MD 11/14/2016 3:43 PM

## 2016-11-14 NOTE — Progress Notes (Signed)
Called again about lab work being redrawn. Spoke with Deja with phlebotomy stated that the order had to be put back in.

## 2016-11-14 NOTE — Progress Notes (Addendum)
Initial Nutrition Assessment  DOCUMENTATION CODES:   Severe malnutrition in context of chronic illness  INTERVENTION:   Osmolite 1.5  @ 60 ml/hr  Provides: 2160 kcals, 90 grams protein, 1094 ml free water. This meets 100% of calorie needs and 100% of protein needs.  If bolus feedings desired: Osmolite 1.5 at 240 ml x6 times per day.   NUTRITION DIAGNOSIS:   Malnutrition (Severe) related to chronic illness, cancer and cancer related treatments as evidenced by 12% weight loss in 4 months, moderate depletion of body fat, moderate to severe depletion of muscle mass.  GOAL:   Patient will meet greater than or equal to 90% of their needs  MONITOR:   TF tolerance, Supplement acceptance, PO intake, Labs, Weight trends  REASON FOR ASSESSMENT:   Malnutrition Screening Tool    ASSESSMENT:   Pt with PMH significant for supraglottic cancer s/p radiation/chemotherapy, COPD, and HTN. Presented from cancer center for evaluation of worsening throat pain and dysphagia. At cancer center patient was found to have elevated calcium level concerning of cancer recurrence and was admitted for further evaluation/treatment.    Pt had PEG tube placed 06/23/16. Spoke with pt at bedside regarding at home feedings. Reports he has not received tube feeding formula from [facility] in two months related to financial issues. States he consumed three Ensure Plus' by mouth per day with jello and other clear liquid products. Explained to pt that his current daily intake is not meeting his needs.   Pt seen by oncology Dietitian 8/14. She recommended Ensure Plus TID with 1 carton of Osmolite TID. Given pt's significant wt loss and severely malnourished state, would recommend using only Osmolite 1.5 at this time. See recommendations above. Pt amendable to continuous feedings.   Records indicate pt has lost 12% of body wt in 4 months. This percentage in this time frame is significant.   Nutrition-Focused  physical exam completed. Findings are moderate fat depletion, severe muscle depletion, and no edema.   Medications reviewed and include: lasix, NS @ 125 ml/hr Labs reviewed: Mg 2.7 (H) Calcium 12.8 (H) (corrected calcium 13.8)  Diet Order:  Diet clear liquid Room service appropriate? Yes; Fluid consistency: Thin  Skin:  Reviewed, no issues  Last BM:  11/14/16  Height:   Ht Readings from Last 1 Encounters:  11/14/16 5\' 3"  (1.6 m)    Weight:   Wt Readings from Last 1 Encounters:  11/14/16 128 lb (58.1 kg)    Ideal Body Weight:  56.4 kg  BMI:  Body mass index is 22.67 kg/m.  Estimated Nutritional Needs:   Kcal:  2040-2240 (35-38 kcal/kg)  Protein:  88-105 grams (1.5-1.8 g/kg)  Fluid:  >2.2 L/day  EDUCATION NEEDS:   No education needs identified at this time  [place], LDN Clinical Nutrition Pager # - [phone]

## 2016-11-14 NOTE — Progress Notes (Signed)
Called x1 to get labs redrawn. Called phlebotomy. They stated they would come draw them asap

## 2016-11-14 NOTE — Care Management Note (Signed)
Case Management Note  Patient Details  Name: Antonio [name] MRN: [id number] Date of Birth: [birth year]  Subjective/Objective:                  61 y.o. male with known supraglottic cancer, follows with [name], s/p radiation and chemo therapy, presented from cancer center for evaluation of progressively worsening throat pain and dysphagia. Patient describes pain as throbbing, 10/10 in severity, radiating down to neck, worse with swallowing, no specific alleviating factors, associated with dysphagia and weight loss, due to poor oral intake. Patient was seen at the cancer center and was found to have high Ca level and was referred for an admission for further treatment. Pt denies chest pain or dyspnea. No specific abd or urinary concerns.   Action/Plan: Date:  November 14, 2016 Chart reviewed for concurrent status and case management needs.  Will continue to follow patient progress.  Discharge Planning: following for needs  Expected discharge date: November 17, 2016  Velva Harman, BSN, [facility], [place]   Expected Discharge Date:   (unknown)               Expected Discharge Plan:  Home/Self Care  In-House Referral:     Discharge planning Services  CM Consult  Post Acute Care Choice:    Choice offered to:     DME Arranged:    DME Agency:     HH Arranged:    HH Agency:     Status of Service:  In process, will continue to follow  If discussed at Long Length of Stay Meetings, dates discussed:    Additional Comments:  Leeroy Cha, RN 11/14/2016, 9:12 AM

## 2016-11-14 NOTE — Progress Notes (Signed)
Patient ID: Antonio [name], male   DOB: [birth year], 61 y.o.   MRN: [id number]    PROGRESS NOTE  Antonio [name]  MWN:[id number] DOB: 13-Dec-1955 DOA: 11/13/2016  PCP: Maryellen Pile, MD   Brief Narrative:  61 y.o. male with known supraglottic cancer, follows with [name], s/p radiation and chemo therapy, presented from cancer center for evaluation of progressively worsening throat pain and dysphagia. Patient describes pain as throbbing, 10/10 in severity, radiating down to neck, worse with swallowing, no specific alleviating factors, associated with dysphagia and weight loss, due to poor oral intake. Patient was seen at the cancer center and was found to have high Ca level and was referred for an admission for further treatment. Pt denies chest pain or dyspnea. No specific abd or urinary concerns.   ED Course: Pt is hemodynamically stable, VSS, Ca 12.8. TRH asked to admit for further evaluation.   Assessment & Plan: 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[place]), throat pain and dysphagia  - with high concern of cancer recurrence especially in the setting of hypercalcemia  - also notable palpable mass in the chin area  - imaging studies notable for multiple pulm nodules, concern for metastatic disease - will follow up on Dr. Calton Dach recommendations   Hypercalcemia of malignancy - provided IV hydration, IV lasix and low dose IV steroids  - BMP pending this AM  Leukocytosis - from steroids - CBC in AM   Cancer associated pain - analgesia as needed   Anemia, chronic disease - Hg up from yesterday, not sure if blood work error  - will repeat CBC in AM   DVT prophylaxis: Lovenox SQ Code Status: Full  Family Communication: Patient at bedside  Disposition Plan: to be determined   Consultants:   Oncology   Procedures:   None  Antimicrobials:   None  Subjective: Still with pain in his throat.   Objective: Vitals:   11/13/16 1630 11/13/16 1700 11/13/16 2105 11/14/16 0427  BP:  133/81 127/86 (!) 156/85 (!) 150/82  Pulse: 86 90 91 96  Resp: 15 (!) 24 20 18   Temp:   98.4 F (36.9 C) 98.7 F (37.1 C)  TempSrc:   Oral Oral  SpO2: 97% 98% 98% 96%    Intake/Output Summary (Last 24 hours) at 11/14/16 0842 Last data filed at 11/14/16 4034  Gross per 24 hour  Intake          1862.08 ml  Output             1350 ml  Net           512.08 ml   There were no vitals filed for this visit.  Examination:  General exam: Appears calm and comfortable  Respiratory system: Clear to auscultation. Respiratory effort normal. Cardiovascular system: S1 & S2 heard, RRR. No JVD, murmurs, rubs, gallops or clicks. No pedal edema. Gastrointestinal system: Abdomen is nondistended, soft and nontender. No organomegaly or masses felt. Normal bowel sounds heard. Central nervous system: Alert and oriented. No focal neurological deficits.  Data Reviewed: I have personally reviewed following labs and imaging studies  CBC:  Recent Labs Lab 11/13/16 1340 11/13/16 2204  WBC 7.6 15.4*  NEUTROABS 6.2  --   HGB 9.8* 13.6  HCT 30.0* 42.8  MCV 82.6 96.8  PLT 278 742   Basic Metabolic Panel:  Recent Labs Lab 11/13/16 1338 11/13/16 2204 11/14/16 0628  NA 138  --  QUESTIONABLE RESULTS, RECOMMEND RECOLLECT TO VERIFY  K 3.8  --  QUESTIONABLE RESULTS, RECOMMEND  RECOLLECT TO VERIFY  CL  --   --  QUESTIONABLE RESULTS, RECOMMEND RECOLLECT TO VERIFY  CO2 31*  --  QUESTIONABLE RESULTS, RECOMMEND RECOLLECT TO VERIFY  GLUCOSE 126  --  QUESTIONABLE RESULTS, RECOMMEND RECOLLECT TO VERIFY  BUN 15.7  --  QUESTIONABLE RESULTS, RECOMMEND RECOLLECT TO VERIFY  CREATININE 1.1 1.17 QUESTIONABLE RESULTS, RECOMMEND RECOLLECT TO VERIFY  CALCIUM 12.8*  --  QUESTIONABLE RESULTS, RECOMMEND RECOLLECT TO VERIFY  MG 2.7*  --   --    Liver Function Tests:  Recent Labs Lab 11/13/16 1338  AST 17  ALT 12  ALKPHOS 67  BILITOT 0.57  PROT 7.2  ALBUMIN 3.4*    Radiology Studies: Ct Head W Or Wo  Contrast Ct Soft Tissue Neck W Contrast Result Date: 11/13/2016 1. No acute intracranial abnormality. No contrast-enhancing brain lesions. 2. Unchanged appearance of circumferential supraglottic laryngeal soft tissue thickening and confluent, masslike adenopathy at left level IIa compared to PET CT of 06/08/2016. 3. Thickening of the epiglottis is compatible with radiation treatment. 4. Please see report for dedicated chest CT for discussion of findings below the thoracic inlet.   Ct Chest W Contrast Result Date: 11/13/2016 1. Interim development of multiple enlarged mediastinal and hilar lymph nodes consistent with metastatic disease. 2. Development of multiple bilateral pulmonary nodules, also suspicious for metastatic disease. Suspect that the previously noted right lower lobe lung mass has increased in size, now measuring about 26 mm compared with 19 mm previously. 3. New small right-sided pleural effusion. Patchy consolidations in the right lower lobe, uncertain if this represents pneumonia, aspiration, or metastatic nodules. 4. Subcentimeter hypodense foci in the liver, too small to further characterize. When the patient is clinically stable and able to follow directions and hold their breath (preferably as an outpatient) further evaluation with dedicated abdominal MRI may be considered.    Scheduled Meds: . cyclobenzaprine  10 mg Oral QHS  . dexamethasone  2 mg Intravenous Q12H  . enoxaparin (LOVENOX) injection  40 mg Subcutaneous Q24H  . furosemide  20 mg Intravenous Daily  . gabapentin  300 mg Oral TID   Continuous Infusions: . sodium chloride 125 mL/hr at 11/13/16 2311     LOS: 1 day   Time spent: 25 minutes   Faye Ramsay, MD [facility] Pager [phone]  If 7PM-7AM, please contact night-coverage [email or web address] Password [email or web address] 11/14/2016, 8:42 AM

## 2016-11-15 ENCOUNTER — Encounter: Payer: Self-pay | Admitting: *Deleted

## 2016-11-15 DIAGNOSIS — E43 Unspecified severe protein-calorie malnutrition: Secondary | ICD-10-CM

## 2016-11-15 DIAGNOSIS — Z515 Encounter for palliative care: Secondary | ICD-10-CM

## 2016-11-15 DIAGNOSIS — R627 Adult failure to thrive: Secondary | ICD-10-CM

## 2016-11-15 DIAGNOSIS — Z7189 Other specified counseling: Secondary | ICD-10-CM

## 2016-11-15 DIAGNOSIS — IMO0002 Reserved for concepts with insufficient information to code with codable children: Secondary | ICD-10-CM

## 2016-11-15 DIAGNOSIS — R1310 Dysphagia, unspecified: Secondary | ICD-10-CM

## 2016-11-15 DIAGNOSIS — C799 Secondary malignant neoplasm of unspecified site: Secondary | ICD-10-CM

## 2016-11-15 LAB — CBC
HCT: 27.4 % — ABNORMAL LOW (ref 39.0–52.0)
HEMOGLOBIN: 9.3 g/dL — AB (ref 13.0–17.0)
MCH: 27.4 pg (ref 26.0–34.0)
MCHC: 33.9 g/dL (ref 30.0–36.0)
MCV: 80.6 fL (ref 78.0–100.0)
Platelets: 308 10*3/uL (ref 150–400)
RBC: 3.4 MIL/uL — ABNORMAL LOW (ref 4.22–5.81)
RDW: 15.3 % (ref 11.5–15.5)
WBC: 7.4 10*3/uL (ref 4.0–10.5)

## 2016-11-15 LAB — BASIC METABOLIC PANEL
ANION GAP: 6 (ref 5–15)
BUN: 13 mg/dL (ref 6–20)
CHLORIDE: 103 mmol/L (ref 101–111)
CO2: 30 mmol/L (ref 22–32)
CREATININE: 0.94 mg/dL (ref 0.61–1.24)
Calcium: 11.5 mg/dL — ABNORMAL HIGH (ref 8.9–10.3)
GFR calc non Af Amer: >60 mL/min (ref >60)
Glucose, Bld: 107 mg/dL — ABNORMAL HIGH (ref 65–99)
POTASSIUM: 3.7 mmol/L (ref 3.5–5.1)
SODIUM: 139 mmol/L (ref 135–145)

## 2016-11-15 LAB — GLUCOSE, CAPILLARY
Glucose-Capillary: 145 mg/dL — ABNORMAL HIGH (ref 65–99)
Glucose-Capillary: 166 mg/dL — ABNORMAL HIGH (ref 65–99)
Glucose-Capillary: 181 mg/dL — ABNORMAL HIGH (ref 65–99)

## 2016-11-15 MED ORDER — OSMOLITE 1.5 CAL PO LIQD
1000.0000 mL | ORAL | Status: DC
  Administered 2016-11-15 – 2016-11-17 (×3): 1000 mL
  Filled 2016-11-15 (×4): qty 1000

## 2016-11-15 MED ORDER — JEVITY 1.2 CAL PO LIQD: 1000.0000 mL | ORAL | Status: DC

## 2016-11-15 MED ORDER — DEXAMETHASONE SODIUM PHOSPHATE 4 MG/ML IJ SOLN
4.0000 mg | Freq: Four times a day (QID) | INTRAMUSCULAR | Status: DC
  Administered 2016-11-15 – 2016-11-18 (×12): 4 mg via INTRAVENOUS
  Filled 2016-11-15 (×13): qty 1

## 2016-11-15 MED ORDER — LIDOCAINE VISCOUS 2 % MT SOLN
15.0000 mL | OROMUCOSAL | Status: DC | PRN
  Filled 2016-11-15 (×2): qty 15

## 2016-11-15 MED ORDER — CALCITONIN (SALMON) 200 UNIT/ACT NA SOLN
1.0000 | Freq: Every day | NASAL | Status: DC
  Administered 2016-11-15 – 2016-11-16 (×2): 1 via NASAL
  Filled 2016-11-15: qty 3.7

## 2016-11-15 MED ORDER — FREE WATER
100.0000 mL | Status: DC
  Administered 2016-11-15 – 2016-11-19 (×23): 100 mL

## 2016-11-15 NOTE — Progress Notes (Signed)
I have extensive discussion with the patient and family members. They understood that he has stage IV metastatic cancer.  Treatment goal is no longer curable We discussed poor prognosis due to malignant hypercalcemia We discussed role of palliative care and hospice versus palliative chemotherapy We discussed CODE STATUS and the patient is not able to commit to definitive plan to change his CODE STATUS. He wanted CPR but declined ventilator support Ultimately, they are undecided I will return tomorrow to check on him again tomorrow I spent over 1 hour of total contact time on counseling today

## 2016-11-15 NOTE — Progress Notes (Signed)
Antonio [name]   DOB:Jan 15, 1956   LG#:[id number]    Subjective: He continues to have throat pain and significant dysphagia.  Throat pain is stable with current dose of pain medicine.  Objective:  Vitals:   11/14/16 2135 11/15/16 0621  BP: (!) 152/80 132/74  Pulse: 98 92  Resp: 18 18  Temp: 98.6 F (37 C) 98.6 F (37 C)  SpO2: 100% 98%     Intake/Output Summary (Last 24 hours) at 11/15/16 0843 Last data filed at 11/15/16 0836  Gross per 24 hour  Intake             3240 ml  Output             1590 ml  Net             1650 ml    GENERAL:alert, no distress and comfortable SKIN: skin color, texture, turgor are normal, no rashes or significant lesions EYES: normal, Conjunctiva are pink and non-injected, sclera clear OROPHARYNX:no exudate, no erythema and lips, buccal mucosa, and tongue normal  NECK: supple, thyroid normal size, non-tender, without nodularity LYMPH:  no palpable lymphadenopathy in the cervical, axillary or inguinal LUNGS: clear to auscultation and percussion with normal breathing effort HEART: regular rate & rhythm and no murmurs and no lower extremity edema ABDOMEN:abdomen soft, non-tender and normal bowel sounds Musculoskeletal:no cyanosis of digits and no clubbing  NEURO: alert & oriented x 3 with dysarthria, no focal motor/sensory deficits   Labs:  Lab Results  Component Value Date   WBC 7.4 11/15/2016   HGB 9.3 (L) 11/15/2016   HCT 27.4 (L) 11/15/2016   MCV 80.6 11/15/2016   PLT 308 11/15/2016   NEUTROABS 6.2 11/13/2016    Lab Results  Component Value Date   NA 139 11/15/2016   K 3.7 11/15/2016   CL 103 11/15/2016   CO2 30 11/15/2016    Studies:  Ct Head W Or Wo Contrast  Result Date: 11/13/2016 CLINICAL DATA:  Worsening throat pain and dysphagia. Supraglottic laryngeal cancer. Status post radiation and chemotherapy. EXAM: CT HEAD WITHOUT AND WITH CONTRAST CT NECK WITH CONTRAST TECHNIQUE: Contiguous axial images were obtained from the base of  the skull through the vertex without and with intravenous contrast. Multidetector CT imaging of the and neck was performed using the standard protocol following the bolus administration of intravenous contrast. CONTRAST:  162mL ISOVUE-300 IOPAMIDOL (ISOVUE-300) INJECTION 61% COMPARISON:  PET CT 06/08/2016 FINDINGS: CT HEAD FINDINGS Brain: No mass lesion, intraparenchymal hemorrhage or extra-axial collection. No evidence of acute cortical infarct. Brain parenchyma and CSF-containing spaces are normal for age. No contrast-enhancing lesions. Vascular: No hyperdense vessel or unexpected calcification. Skull: Normal visualized skull base, calvarium and extracranial soft tissues. Sinuses/Orbits: No sinus fluid levels or advanced mucosal thickening. No mastoid effusion. Normal orbits. CT NECK FINDINGS Pharynx and larynx: --Nasopharynx: Fossae of Rosenmuller are clear. Normal adenoid tonsils for age. --Oral cavity and oropharynx: The palatine and lingual tonsils are normal. The visible oral cavity and floor of mouth are normal. --Hypopharynx: There is hypertrophy of the lingual tonsils. --Larynx: The epiglottis is thickened, compatible with history of radiation treatment. There is circumferential soft tissue thickening within the larynx. This is greatest at the posterior left aspect. --Retropharyngeal space: No abscess, effusion or lymphadenopathy. Salivary glands: --Parotid: No mass lesion or inflammation. No sialolithiasis or ductal dilatation. --Submandibular: Symmetric without inflammation. No sialolithiasis or ductal dilatation. --Sublingual: Normal. No ranula or other visible lesion of the base of tongue and floor of mouth. Thyroid:  Normal. Lymph nodes: There is confluent lymphadenopathy at left level 2, encasing the proximal internal and external carotid arteries. This appears unchanged compared to the prior PET CT. Right level 5A node measures 9 mm. Vascular: There is aortic atherosclerotic calcification. There is  mixed calcified and noncalcified plaque within both common carotid arteries and at the carotid bifurcations. The there is an accessed right chest wall Port-A-Cath with its tip coursing beyond the field of view. Skeleton: No bony spinal canal stenosis. No lytic or blastic lesions. Upper chest: Please see dedicated report for chest CT. Other: None. IMPRESSION: 1. No acute intracranial abnormality. No contrast-enhancing brain lesions. 2. Unchanged appearance of circumferential supraglottic laryngeal soft tissue thickening and confluent, masslike adenopathy at left level IIa compared to PET CT of 06/08/2016. 3. Thickening of the epiglottis is compatible with radiation treatment. 4. Please see report for dedicated chest CT for discussion of findings below the thoracic inlet. Electronically Signed   By: Ulyses Jarred M.D.   On: 11/13/2016 18:50   Ct Soft Tissue Neck W Contrast  Result Date: 11/13/2016 CLINICAL DATA:  Worsening throat pain and dysphagia. Supraglottic laryngeal cancer. Status post radiation and chemotherapy. EXAM: CT HEAD WITHOUT AND WITH CONTRAST CT NECK WITH CONTRAST TECHNIQUE: Contiguous axial images were obtained from the base of the skull through the vertex without and with intravenous contrast. Multidetector CT imaging of the and neck was performed using the standard protocol following the bolus administration of intravenous contrast. CONTRAST:  18mL ISOVUE-300 IOPAMIDOL (ISOVUE-300) INJECTION 61% COMPARISON:  PET CT 06/08/2016 FINDINGS: CT HEAD FINDINGS Brain: No mass lesion, intraparenchymal hemorrhage or extra-axial collection. No evidence of acute cortical infarct. Brain parenchyma and CSF-containing spaces are normal for age. No contrast-enhancing lesions. Vascular: No hyperdense vessel or unexpected calcification. Skull: Normal visualized skull base, calvarium and extracranial soft tissues. Sinuses/Orbits: No sinus fluid levels or advanced mucosal thickening. No mastoid effusion. Normal  orbits. CT NECK FINDINGS Pharynx and larynx: --Nasopharynx: Fossae of Rosenmuller are clear. Normal adenoid tonsils for age. --Oral cavity and oropharynx: The palatine and lingual tonsils are normal. The visible oral cavity and floor of mouth are normal. --Hypopharynx: There is hypertrophy of the lingual tonsils. --Larynx: The epiglottis is thickened, compatible with history of radiation treatment. There is circumferential soft tissue thickening within the larynx. This is greatest at the posterior left aspect. --Retropharyngeal space: No abscess, effusion or lymphadenopathy. Salivary glands: --Parotid: No mass lesion or inflammation. No sialolithiasis or ductal dilatation. --Submandibular: Symmetric without inflammation. No sialolithiasis or ductal dilatation. --Sublingual: Normal. No ranula or other visible lesion of the base of tongue and floor of mouth. Thyroid: Normal. Lymph nodes: There is confluent lymphadenopathy at left level 2, encasing the proximal internal and external carotid arteries. This appears unchanged compared to the prior PET CT. Right level 5A node measures 9 mm. Vascular: There is aortic atherosclerotic calcification. There is mixed calcified and noncalcified plaque within both common carotid arteries and at the carotid bifurcations. The there is an accessed right chest wall Port-A-Cath with its tip coursing beyond the field of view. Skeleton: No bony spinal canal stenosis. No lytic or blastic lesions. Upper chest: Please see dedicated report for chest CT. Other: None. IMPRESSION: 1. No acute intracranial abnormality. No contrast-enhancing brain lesions. 2. Unchanged appearance of circumferential supraglottic laryngeal soft tissue thickening and confluent, masslike adenopathy at left level IIa compared to PET CT of 06/08/2016. 3. Thickening of the epiglottis is compatible with radiation treatment. 4. Please see report for dedicated chest CT for  discussion of findings below the thoracic inlet.  Electronically Signed   By: Ulyses Jarred M.D.   On: 11/13/2016 18:50   Ct Chest W Contrast  Result Date: 11/13/2016 CLINICAL DATA:  History of head and neck cancer with throat pain and dysphagia EXAM: CT CHEST WITH CONTRAST TECHNIQUE: Multidetector CT imaging of the chest was performed during intravenous contrast administration. CONTRAST:  147mL ISOVUE-300 IOPAMIDOL (ISOVUE-300) INJECTION 61% COMPARISON:  PET-CT 06/08/2016 FINDINGS: Cardiovascular: Nonaneurysmal aorta. Atherosclerotic vascular calcifications. Coronary artery calcification. Central venous catheter tip terminates at the cavoatrial junction. Normal heart size. No significant pericardial effusion. Mediastinum/Nodes: Since the previous PET-CT, development of multiple enlarged mediastinal lymph nodes. Right paratracheal lymph node measures 32 x 22 cm. AP window lymph node measures 16 mm. subcarinal lymph node measures 23 mm. A lymph node anterior to the pericardium measures 6 mm. Development of bilateral hilar adenopathy, measuring 15 mm on the right and 17 mm on the left. Midline trachea.  No thyroid mass.  Esophagus within normal limits. Lungs/Pleura: Development of a small right-sided pleural effusion. Suspect increased size of a right lower lobe lung mass but increased consolidation in the right lower lobe makes assessment difficult. Mass measures approximately 2.6 cm, compared with 1.9 cm previously. Development of multiple bilateral ill-defined pulmonary nodules within the upper lobes and bilateral lower lobes. Patchy consolidation within the right lower lobe. Upper Abdomen: Subcentimeter hypodense liver foci too small to further characterize. Subcentimeter low-density lesion mid to upper left kidney. Musculoskeletal: No acute or suspicious bone lesions IMPRESSION: 1. Interim development of multiple enlarged mediastinal and hilar lymph nodes consistent with metastatic disease. 2. Development of multiple bilateral pulmonary nodules, also  suspicious for metastatic disease. Suspect that the previously noted right lower lobe lung mass has increased in size, now measuring about 26 mm compared with 19 mm previously. 3. New small right-sided pleural effusion. Patchy consolidations in the right lower lobe, uncertain if this represents pneumonia, aspiration, or metastatic nodules. 4. Subcentimeter hypodense foci in the liver, too small to further characterize. When the patient is clinically stable and able to follow directions and hold their breath (preferably as an outpatient) further evaluation with dedicated abdominal MRI may be considered. Aortic Atherosclerosis (ICD10-I70.0). Electronically Signed   By: Donavan Foil M.D.   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 Unfortunately, the patient has developed recurrent disease within 3 months of stopping treatment At this point in time, even without a biopsy, this is highly suspicious for recurrent metastatic squamous cell carcinoma from the head and neck region The patient had severe, locally advanced disease before chemoradiation therapy The patient is informed that his disease is no longer curable and any form of treatment is strictly palliative in nature With significant disease diffuse disease, even with chemotherapy, I think his prognosis is very poor I recommend family meeting today around 330 pm to 4 pm I have requested consultation with palliative care team for goals of care discussion  In the meantime, we will provide supportive care with pain management and management of malignant hypercalcemia  Malignant hypercalcemia He is on aggressive IV fluid resuscitation, IV Lasix and steroids Calcium is mildly improved but still abnormal I will increase the dose of steroids and added nasal calcitonin Avoid IV bisphosphonate due to risk of osteonecrosis of the jaw, but I might have to  give it tomorrow if it still not improved  Moderate to  severe protein calorie malnutrition Will consult dietitian to start tube feeding  Severe cancer pain He will continue pain management with morphine sulfate I will add availability of liquid Roxanol to be given through the feeding tube due to difficulties with swallowing and IV prn morphine for breakthrough pain  Anemia chronic illness Will observe  CODE STATUS He is currently full code We will further discuss goals of care and CODE STATUS later  Discharge planning He is not safe to be discharged until hypercalcemia resolved I will return for family meeting later today  [name], MD 11/15/2016  8:43 AM

## 2016-11-15 NOTE — Progress Notes (Signed)
PROGRESS NOTE    Antonio [name]  TMH:[id number] DOB: [birth year] DOA: 11/13/2016 PCP: Maryellen Pile, MD   Brief Narrative:  61 year old male with history of supraglottic cancer status post chemoradiation follows with [name] admitted for worsening throat pain and dysphagia. He was also noted to be hypercalcemic therefore getting treatment with IV Lasix, IV fluids and steroids. Repeat scans a CT head and neck were suggestive of widespread recurrence of malignancy. Per oncology disease diseases uncurable therefore will need palliative care.   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 He Manda needing a PEG tube.  Malignant hypercalcemia -Continue steroids, IV fluids, IV Lasix. Nasal calcitonin added today. -Currently on Decadron 4 mg every 6 hours -She Manda needing bisphosphonates.  Anemia of chronic disease -This is in setting of ongoing chronic issues including his cancer  Mild leukocytosis -No signs of active infection. He is on steroids. No antibiotics at this time   DVT prophylaxis: Subcutaneous Lovenox Code Status: Full code Family Communication:  None at bedside Disposition Plan: To be determined  Consultants:   Oncology  Palliative care  Procedures:   None  Antimicrobials:   None   Subjective: No complaints besides ongoing dysphagia. No acute events overnight  Objective: Vitals:   11/14/16 1350 11/14/16 1412 11/14/16 2135 11/15/16 0621  BP:  (!) 153/87 (!) 152/80 132/74  Pulse:  96 98 92  Resp:   18 18 18   Temp:  98.9 F (37.2 C) 98.6 F (37 C) 98.6 F (37 C)  TempSrc:  Oral Oral Oral  SpO2:  99% 100% 98%  Weight: 58.1 kg (128 lb)     Height: 5\' 3"  (1.6 m)       Intake/Output Summary (Last 24 hours) at 11/15/16 1034 Last data filed at 11/15/16 0936  Gross per 24 hour  Intake             3480 ml  Output             1590 ml  Net             1890 ml   Filed Weights   11/14/16 1350  Weight: 58.1 kg (128 lb)    Examination:  General exam: Appears calm and comfortable  Respiratory system: Clear to auscultation. Respiratory effort normal. Cardiovascular system: S1 & S2 heard, RRR. No JVD, murmurs, rubs, gallops or clicks. No pedal edema. Gastrointestinal system: Abdomen is nondistended, soft and nontender. No organomegaly or masses felt. Normal bowel sounds heard. Central nervous system: Alert and oriented. No focal neurological deficits. Extremities: Symmetric 5 x 5 power. Skin: No rashes, lesions or ulcers Psychiatry: Judgement and insight appear normal. Mood & affect appropriate.     Data Reviewed:   CBC:  Recent Labs Lab 11/13/16 1340 11/13/16 2204 11/14/16 1638 11/15/16 0504  WBC 7.6 15.4* 8.2 7.4  NEUTROABS 6.2  --   --   --   HGB 9.8* 13.6 9.6* 9.3*  HCT 30.0* 42.8 28.2* 27.4*  MCV 82.6 96.8 79.4 80.6  PLT 278 248 303 921   Basic Metabolic Panel:  Recent Labs Lab 11/13/16 1338 11/13/16  2204 11/14/16 0628 11/14/16 1638 11/15/16 0504  NA 138  --  QUESTIONABLE RESULTS, RECOMMEND RECOLLECT TO VERIFY 139 139  K 3.8  --  QUESTIONABLE RESULTS, RECOMMEND RECOLLECT TO VERIFY 3.7 3.7  CL  --   --  QUESTIONABLE RESULTS, RECOMMEND RECOLLECT TO VERIFY 101 103  CO2 31*  --  QUESTIONABLE RESULTS, RECOMMEND RECOLLECT TO VERIFY 29 30  GLUCOSE 126  --  QUESTIONABLE RESULTS, RECOMMEND RECOLLECT TO VERIFY 110* 107*  BUN 15.7  --  QUESTIONABLE RESULTS, RECOMMEND RECOLLECT TO VERIFY 13 13  CREATININE 1.1 1.17 QUESTIONABLE RESULTS, RECOMMEND RECOLLECT TO VERIFY  0.93 0.94  CALCIUM 12.8*  --  QUESTIONABLE RESULTS, RECOMMEND RECOLLECT TO VERIFY 11.6* 11.5*  MG 2.7*  --   --   --   --    GFR: Estimated Creatinine Clearance: 67.3 mL/min (by C-G formula based on SCr of 0.94 mg/dL). Liver Function Tests:  Recent Labs Lab 11/13/16 1338  AST 17  ALT 12  ALKPHOS 67  BILITOT 0.57  PROT 7.2  ALBUMIN 3.4*   No results for input(s): LIPASE, AMYLASE in the last 168 hours. No results for input(s): AMMONIA in the last 168 hours. Coagulation Profile: No results for input(s): INR, PROTIME in the last 168 hours. Cardiac Enzymes: No results for input(s): CKTOTAL, CKMB, CKMBINDEX, TROPONINI in the last 168 hours. BNP (last 3 results) No results for input(s): PROBNP in the last 8760 hours. HbA1C: No results for input(s): HGBA1C in the last 72 hours. CBG: No results for input(s): GLUCAP in the last 168 hours. Lipid Profile: No results for input(s): CHOL, HDL, LDLCALC, TRIG, CHOLHDL, LDLDIRECT in the last 72 hours. Thyroid Function Tests: No results for input(s): TSH, T4TOTAL, FREET4, T3FREE, THYROIDAB in the last 72 hours. Anemia Panel: No results for input(s): VITAMINB12, FOLATE, FERRITIN, TIBC, IRON, RETICCTPCT in the last 72 hours. Sepsis Labs: No results for input(s): PROCALCITON, LATICACIDVEN in the last 168 hours.  No results found for this or any previous visit (from the past 240 hour(s)).       Radiology Studies: Ct Head W Or Wo Contrast  Result Date: 11/13/2016 CLINICAL DATA:  Worsening throat pain and dysphagia. Supraglottic laryngeal cancer. Status post radiation and chemotherapy. EXAM: CT HEAD WITHOUT AND WITH CONTRAST CT NECK WITH CONTRAST TECHNIQUE: Contiguous axial images were obtained from the base of the skull through the vertex without and with intravenous contrast. Multidetector CT imaging of the and neck was performed using the standard protocol following the bolus administration of intravenous contrast. CONTRAST:  164mL  ISOVUE-300 IOPAMIDOL (ISOVUE-300) INJECTION 61% COMPARISON:  PET CT 06/08/2016 FINDINGS: CT HEAD FINDINGS Brain: No mass lesion, intraparenchymal hemorrhage or extra-axial collection. No evidence of acute cortical infarct. Brain parenchyma and CSF-containing spaces are normal for age. No contrast-enhancing lesions. Vascular: No hyperdense vessel or unexpected calcification. Skull: Normal visualized skull base, calvarium and extracranial soft tissues. Sinuses/Orbits: No sinus fluid levels or advanced mucosal thickening. No mastoid effusion. Normal orbits. CT NECK FINDINGS Pharynx and larynx: --Nasopharynx: Fossae of Rosenmuller are clear. Normal adenoid tonsils for age. --Oral cavity and oropharynx: The palatine and lingual tonsils are normal. The visible oral cavity and floor of mouth are normal. --Hypopharynx: There is hypertrophy of the lingual tonsils. --Larynx: The epiglottis is thickened, compatible with history of radiation treatment. There is circumferential soft tissue thickening within the larynx. This is greatest at the posterior left aspect. --Retropharyngeal space: No abscess, effusion or lymphadenopathy. Salivary glands: --Parotid: No mass lesion or inflammation. No sialolithiasis or ductal dilatation. --  Submandibular: Symmetric without inflammation. No sialolithiasis or ductal dilatation. --Sublingual: Normal. No ranula or other visible lesion of the base of tongue and floor of mouth. Thyroid: Normal. Lymph nodes: There is confluent lymphadenopathy at left level 2, encasing the proximal internal and external carotid arteries. This appears unchanged compared to the prior PET CT. Right level 5A node measures 9 mm. Vascular: There is aortic atherosclerotic calcification. There is mixed calcified and noncalcified plaque within both common carotid arteries and at the carotid bifurcations. The there is an accessed right chest wall Port-A-Cath with its tip coursing beyond the field of view. Skeleton: No bony  spinal canal stenosis. No lytic or blastic lesions. Upper chest: Please see dedicated report for chest CT. Other: None. IMPRESSION: 1. No acute intracranial abnormality. No contrast-enhancing brain lesions. 2. Unchanged appearance of circumferential supraglottic laryngeal soft tissue thickening and confluent, masslike adenopathy at left level IIa compared to PET CT of 06/08/2016. 3. Thickening of the epiglottis is compatible with radiation treatment. 4. Please see report for dedicated chest CT for discussion of findings below the thoracic inlet. Electronically Signed   By: Ulyses Jarred M.D.   On: 11/13/2016 18:50   Ct Soft Tissue Neck W Contrast  Result Date: 11/13/2016 CLINICAL DATA:  Worsening throat pain and dysphagia. Supraglottic laryngeal cancer. Status post radiation and chemotherapy. EXAM: CT HEAD WITHOUT AND WITH CONTRAST CT NECK WITH CONTRAST TECHNIQUE: Contiguous axial images were obtained from the base of the skull through the vertex without and with intravenous contrast. Multidetector CT imaging of the and neck was performed using the standard protocol following the bolus administration of intravenous contrast. CONTRAST:  198mL ISOVUE-300 IOPAMIDOL (ISOVUE-300) INJECTION 61% COMPARISON:  PET CT 06/08/2016 FINDINGS: CT HEAD FINDINGS Brain: No mass lesion, intraparenchymal hemorrhage or extra-axial collection. No evidence of acute cortical infarct. Brain parenchyma and CSF-containing spaces are normal for age. No contrast-enhancing lesions. Vascular: No hyperdense vessel or unexpected calcification. Skull: Normal visualized skull base, calvarium and extracranial soft tissues. Sinuses/Orbits: No sinus fluid levels or advanced mucosal thickening. No mastoid effusion. Normal orbits. CT NECK FINDINGS Pharynx and larynx: --Nasopharynx: Fossae of Rosenmuller are clear. Normal adenoid tonsils for age. --Oral cavity and oropharynx: The palatine and lingual tonsils are normal. The visible oral cavity and  floor of mouth are normal. --Hypopharynx: There is hypertrophy of the lingual tonsils. --Larynx: The epiglottis is thickened, compatible with history of radiation treatment. There is circumferential soft tissue thickening within the larynx. This is greatest at the posterior left aspect. --Retropharyngeal space: No abscess, effusion or lymphadenopathy. Salivary glands: --Parotid: No mass lesion or inflammation. No sialolithiasis or ductal dilatation. --Submandibular: Symmetric without inflammation. No sialolithiasis or ductal dilatation. --Sublingual: Normal. No ranula or other visible lesion of the base of tongue and floor of mouth. Thyroid: Normal. Lymph nodes: There is confluent lymphadenopathy at left level 2, encasing the proximal internal and external carotid arteries. This appears unchanged compared to the prior PET CT. Right level 5A node measures 9 mm. Vascular: There is aortic atherosclerotic calcification. There is mixed calcified and noncalcified plaque within both common carotid arteries and at the carotid bifurcations. The there is an accessed right chest wall Port-A-Cath with its tip coursing beyond the field of view. Skeleton: No bony spinal canal stenosis. No lytic or blastic lesions. Upper chest: Please see dedicated report for chest CT. Other: None. IMPRESSION: 1. No acute intracranial abnormality. No contrast-enhancing brain lesions. 2. Unchanged appearance of circumferential supraglottic laryngeal soft tissue thickening and confluent, masslike adenopathy at left  level IIa compared to PET CT of 06/08/2016. 3. Thickening of the epiglottis is compatible with radiation treatment. 4. Please see report for dedicated chest CT for discussion of findings below the thoracic inlet. Electronically Signed   By: Ulyses Jarred M.D.   On: 11/13/2016 18:50   Ct Chest W Contrast  Result Date: 11/13/2016 CLINICAL DATA:  History of head and neck cancer with throat pain and dysphagia EXAM: CT CHEST WITH CONTRAST  TECHNIQUE: Multidetector CT imaging of the chest was performed during intravenous contrast administration. CONTRAST:  1105mL ISOVUE-300 IOPAMIDOL (ISOVUE-300) INJECTION 61% COMPARISON:  PET-CT 06/08/2016 FINDINGS: Cardiovascular: Nonaneurysmal aorta. Atherosclerotic vascular calcifications. Coronary artery calcification. Central venous catheter tip terminates at the cavoatrial junction. Normal heart size. No significant pericardial effusion. Mediastinum/Nodes: Since the previous PET-CT, development of multiple enlarged mediastinal lymph nodes. Right paratracheal lymph node measures 32 x 22 cm. AP window lymph node measures 16 mm. subcarinal lymph node measures 23 mm. A lymph node anterior to the pericardium measures 6 mm. Development of bilateral hilar adenopathy, measuring 15 mm on the right and 17 mm on the left. Midline trachea.  No thyroid mass.  Esophagus within normal limits. Lungs/Pleura: Development of a small right-sided pleural effusion. Suspect increased size of a right lower lobe lung mass but increased consolidation in the right lower lobe makes assessment difficult. Mass measures approximately 2.6 cm, compared with 1.9 cm previously. Development of multiple bilateral ill-defined pulmonary nodules within the upper lobes and bilateral lower lobes. Patchy consolidation within the right lower lobe. Upper Abdomen: Subcentimeter hypodense liver foci too small to further characterize. Subcentimeter low-density lesion mid to upper left kidney. Musculoskeletal: No acute or suspicious bone lesions IMPRESSION: 1. Interim development of multiple enlarged mediastinal and hilar lymph nodes consistent with metastatic disease. 2. Development of multiple bilateral pulmonary nodules, also suspicious for metastatic disease. Suspect that the previously noted right lower lobe lung mass has increased in size, now measuring about 26 mm compared with 19 mm previously. 3. New small right-sided pleural effusion. Patchy  consolidations in the right lower lobe, uncertain if this represents pneumonia, aspiration, or metastatic nodules. 4. Subcentimeter hypodense foci in the liver, too small to further characterize. When the patient is clinically stable and able to follow directions and hold their breath (preferably as an outpatient) further evaluation with dedicated abdominal MRI may be considered. Aortic Atherosclerosis (ICD10-I70.0). Electronically Signed   By: Donavan Foil M.D.   On: 11/13/2016 18:59        Scheduled Meds: . calcitonin (salmon)  1 spray Alternating Nares Daily  . cyclobenzaprine  10 mg Oral QHS  . dexamethasone  4 mg Intravenous Q6H  . enoxaparin (LOVENOX) injection  40 mg Subcutaneous Q24H  . furosemide  20 mg Intravenous Daily  . gabapentin  300 mg Oral TID   Continuous Infusions: . sodium chloride 125 mL/hr at 11/14/16 2123  . feeding supplement (OSMOLITE 1.5 CAL)       LOS: 2 days    Time spent: 25 mins     Ankit Arsenio Loader, MD [facility] Pager [phone]   If 7PM-7AM, please contact night-coverage [email or web address] Password [email or web address] 11/15/2016, 10:34 AM

## 2016-11-15 NOTE — Progress Notes (Signed)
Nutrition Brief Note  RD consulted for tube feeding initiation and management. RD placed ordered previously recommended. RD to continue follow up to monitor tolerance and changes.  Parks Ranger, MS, RDN, LDN 11/15/2016 9:16 AM

## 2016-11-15 NOTE — Progress Notes (Signed)
Oncology Nurse Navigator Documentation  Visited pt [facility] [id number] to check on his well-being.  He was resting comfortably in bed.  Will continue to follow during this admission.  [name], RN, BSN, [place] [facility] Nurse [place] [facility] [place] [phone]

## 2016-11-15 NOTE — Consult Note (Addendum)
Consultation Note Date: 11/15/2016   Patient Name: Antonio [name]  DOB: 02/29/1955  MRN: [id number]  Age / Sex: 61 y.o., male  PCP: Maryellen Pile, MD Referring Physician: Damita Lack, MD  Reason for Consultation: Establishing goals of care  HPI/Patient Profile: 61 y.o. 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  Clinical Assessment and Goals of Care:  61 year old male with history of supraglottic cancer status post chemoradiation follows with [name] admitted for worsening throat pain and dysphagia. He was also noted to be hypercalcemic therefore getting treatment with IV Lasix, IV fluids and steroids. Repeat scans a CT head and neck were suggestive of widespread recurrence of malignancy.  Palliative care consulted for goals of care discussions.   I met with the patient this morning, he was awake alert resting in bed. I introduced myself and palliative care as follows: Palliative medicine is specialized medical care for people living with serious illness. It focuses on providing relief from the symptoms and stress of a serious illness. The goal is to improve quality of life for both the patient and the family.  He stated that he felt fine, has always had pain in his L thigh. He also admitted to some ongoing pain in his throat, he complained of not being able to swallow.  He said that he lived alone, he has 3 sisters whom he is close to, who are his primary caregivers, he does not have a spouse, he thinks he may have an adult child, how ever, he is not aware that he does.   We discussed the severity of his serious illness, our concerns for this being a rapidly spreading malignancy. His goals, wishes and values were attempted to be elicited.   See additional discussions and  recommendations below, thank you for the consult.   NEXT OF KIN  has 3 sisters who live locally and who are involved with his care.   SUMMARY OF RECOMMENDATIONS    1. Add viscous lidocaine PO for his sore throat 2. Continue current opioid regimen, will need some long acting pain medication soon. Will continue to assess for pain management needs.  3. Has PEG, tube feedings resumed. Continue for now.  4.ongoing goals of care discussions. The patient is still grasping the severity and the non curative nature of his illness, we will continue goals of care conversations and help establish further discussions such as code status, hospice discussions etc.   The patient's family did arrive this afternoon, met with Dr [name], appreciate her input, Dr Calton Dach note reviewed, I did knock and entered his room, but found that the patient and his family were in a prayer circle for a prolonged period of time, shortly after meeting with Dr [name] this afternoon. I will follow up with them in am 11-16-16.   Thank you for the consult.   Code Status/Advance Care Planning:  Full code    Symptom Management:  as above   Palliative Prophylaxis:   Delirium Protocol   Psycho-social/Spiritual:   Desire for further Chaplaincy support:yes  Additional Recommendations: Education on Hospice  Prognosis:   Guarded   Discharge Planning: To Be Determined      Primary Diagnoses: Present on Admission: . Hypercalcemia   I have reviewed the medical record, interviewed the patient and family, and examined the patient. The following aspects are pertinent.  Past Medical History:  Diagnosis Date  . Allergy    allergic rhinitis  . Arthritis    "qwhere" (05/17/2016)  . Chronic cough   . Chronic sinus infection   . Colonic polyp   . COPD (chronic obstructive pulmonary disease) (HCC)   . 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 Dysphagia    normal EGD except for mild gastritis  in 01/2006  . GERD (gastroesophageal reflux disease)   . Head and neck malignancy (HCC) 05/18/2016  . High cholesterol   . History of radiation therapy 06/28/16- 08/17/16   Larynx 70 Gy in 35 fractions to gross disease, 63 Gy in 35 fractions to high risk nodal echelons, and 56 Gy in 35 fractions to intermediate risk nodal echelons  . History of radiation therapy 06/28/16- 08/17/16   Right Lung 60 Gy in 5 fractions--- given at same time as Larynx radiation  . Hypertension   . Rectal bleeding    colonoscopy in 01/2006  . Rectal polyp    01/2006 with path showing it to be benign, though with a question that it was incomplete biopsy, next scheduled coloscopy 2010  . Sickle cell trait (HCC)   . Tobacco abuse   . Varus deformity of knee    bilateral, congenital   Social History   Social History  . Marital status: Single    Spouse name: N/A  . Number of children: N/A  . Years of education: N/A   Social History Main Topics  . Smoking status: Former Smoker    Packs/day: 0.12    Years: 43.00    Types: Cigarettes  . Smokeless tobacco: Never Used     Comment: cuting back  . Alcohol use 14.4 oz/week    24 Cans of beer per week     Comment: he tells me he has decreased. He is drinking 6-8 cans of beer a week.   . Drug use: Yes    Types: "Crack" cocaine, Marijuana     Comment: 05/17/2016 "quit marijuana in the 1970s til recently; started smoking to kill pain in my legs/knees; use crack very rarely"  . Sexual activity: Not Currently   Other Topics Concern  . None   Social History Narrative   Currently awaiting disability.   Cell# [phone]   Family History  Problem Relation Age of Onset  . Cancer Mother        gastric ca  . Hypertension Father   . Diabetes Father   . Diabetes Sister   . CAD Sister    Scheduled Meds: . calcitonin (salmon)  1 spray Alternating Nares Daily  . cyclobenzaprine  10 mg Oral QHS  . dexamethasone  4 mg Intravenous Q6H  . enoxaparin (LOVENOX) injection  40 mg  Subcutaneous Q24H  . free water  100 mL Per Tube Q4H  . furosemide  20 mg Intravenous Daily  . gabapentin  300 mg Oral TID   Continuous Infusions: . sodium chloride 125 mL/hr at 11/15/16 1517  . feeding supplement (OSMOLITE 1.5 CAL) 1,000 mL (11/15/16  1259)   PRN Meds:.diphenhydrAMINE, lip balm, morphine, morphine injection, morphine CONCENTRATE, ondansetron **OR** ondansetron (ZOFRAN) IV, sodium chloride flush Medications Prior to Admission:  Prior to Admission medications   Medication Sig Start Date End Date Taking? Authorizing Provider  cyclobenzaprine (FLEXERIL) 10 MG tablet TAKE ONE TABLET BY MOUTH AT BEDTIME Patient taking differently: TAKE ONE TABLET BY MOUTH AT BEDTIME PRN FOR SPASMS 10/19/16  Yes Maryellen Pile, MD  gabapentin (NEURONTIN) 300 MG capsule Take 1 capsule (300 mg total) by mouth 3 (three) times daily. 08/24/16  Yes Gorsuch, Ni, MD  morphine (MSIR) 15 MG tablet Take 1 tablet (15 mg total) by mouth every 6 (six) hours as needed for severe pain. 11/02/16  Yes Wyatt Portela, MD  EQ ALLERGY RELIEF 10 MG tablet TAKE ONE TABLET BY MOUTH ONCE DAILY Patient not taking: Reported on 11/13/2016 07/17/16   Maryellen Pile, MD  lidocaine-prilocaine (EMLA) cream Apply to affected area once Patient not taking: Reported on 10/18/2016 06/14/16   [name], MD  Nutritional Supplements (FEEDING SUPPLEMENT, OSMOLITE 1.5 CAL,) LIQD Begin Osmolite 1.5 via PEG TID with 60 mL free water. Advance as tolerated to goal of 1 1/2 bottles QID with 60 mL free water before and after. In addition, drink or flush PEG with 240 mL free water TID. Patient not taking: Reported on 11/13/2016 07/05/16   [name], MD  ondansetron (ZOFRAN) 8 MG tablet Take 1 tablet (8 mg total) by mouth 2 (two) times daily as needed. Start on the third day after chemotherapy. Patient not taking: Reported on 10/18/2016 06/14/16   [name], MD  prochlorperazine (COMPAZINE) 10 MG tablet Take 1 tablet (10 mg total) by mouth every 6 (six)  hours as needed (Nausea or vomiting). Patient not taking: Reported on 10/18/2016 06/14/16   [name], MD  senna-docusate (SENOKOT-S) 8.6-50 MG tablet Take 1 tablet by mouth 2 (two) times daily. Patient not taking: Reported on 11/13/2016 06/14/16   [name], MD   No Known Allergies Review of Systems +pain in thigh +sore throat  Physical Exam GENERAL:alert, no distress and comfortable Has some mild swelling on his R side of neck  LYMPH:  no palpable lymphadenopathy in the cervical, axillary or inguinal LUNGS: clear to auscultation and percussion with normal breathing effort HEART: regular rate & rhythm and no murmurs and no lower extremity edema ABDOMEN:abdomen soft, non-tender and normal bowel sounds Musculoskeletal:no cyanosis of digits and no clubbing  NEURO: alert & oriented x 3 with dysarthria, no focal motor/sensory deficits               Vital Signs: BP 136/68 (BP Location: Left Arm)   Pulse 96   Temp 98.3 F (36.8 C) (Oral)   Resp 18   Ht '5\' 3"'$  (1.6 m)   Wt 58.1 kg (128 lb)   SpO2 100%   BMI 22.67 kg/m  Pain Assessment: 0-10   Pain Score: 0-No pain   SpO2: SpO2: 100 % O2 Device:SpO2: 100 % O2 Flow Rate: .   IO: Intake/output summary:  Intake/Output Summary (Last 24 hours) at 11/15/16 1637 Last data filed at 11/15/16 1348  Gross per 24 hour  Intake             2110 ml  Output              750 ml  Net             1360 ml    LBM: Last BM Date: 11/14/16 Baseline Weight: Weight:  58.1 kg (128 lb) Most recent weight: Weight: 58.1 kg (128 lb)     Palliative Assessment/Data:   Flowsheet Rows     Most Recent Value  Intake Tab  Referral Department  Hospitalist  Unit at Time of Referral  Oncology Unit  Palliative Care Primary Diagnosis  Cancer  Palliative Care Type  New Palliative care  Reason for referral  Clarify Goals of Care  Clinical Assessment  Palliative Performance Scale Score  40%  Pain Max last 24 hours  4  Pain Min Last 24 hours  3  Psychosocial  & Spiritual Assessment  Palliative Care Outcomes    PPS 40%  Time In:  10 Time Out:  11.10 Time Total:  70 min  Greater than 50%  of this time was spent counseling and coordinating care related to the above assessment and plan.  Signed by: Loistine Chance, MD  [phone]  Please contact Palliative Medicine Team phone at [phone] for questions and concerns.  For individual provider: See Shea Evans

## 2016-11-16 LAB — COMPREHENSIVE METABOLIC PANEL
ALBUMIN: 3 g/dL — AB (ref 3.5–5.0)
ALT: 13 U/L — ABNORMAL LOW (ref 17–63)
ANION GAP: 6 (ref 5–15)
AST: 17 U/L (ref 15–41)
Alkaline Phosphatase: 55 U/L (ref 38–126)
BILIRUBIN TOTAL: 0.3 mg/dL (ref 0.3–1.2)
BUN: 16 mg/dL (ref 6–20)
CO2: 29 mmol/L (ref 22–32)
Calcium: 11.1 mg/dL — ABNORMAL HIGH (ref 8.9–10.3)
Chloride: 103 mmol/L (ref 101–111)
Creatinine, Ser: 0.86 mg/dL (ref 0.61–1.24)
GFR calc Af Amer: >60 mL/min (ref >60)
Glucose, Bld: 177 mg/dL — ABNORMAL HIGH (ref 65–99)
POTASSIUM: 3.8 mmol/L (ref 3.5–5.1)
Sodium: 138 mmol/L (ref 135–145)
TOTAL PROTEIN: 6.1 g/dL — AB (ref 6.5–8.1)

## 2016-11-16 LAB — GLUCOSE, CAPILLARY
GLUCOSE-CAPILLARY: 169 mg/dL — AB (ref 65–99)
GLUCOSE-CAPILLARY: 139 mg/dL — AB (ref 65–99)
GLUCOSE-CAPILLARY: 138 mg/dL — AB (ref 65–99)
Glucose-Capillary: 127 mg/dL — ABNORMAL HIGH (ref 65–99)
Glucose-Capillary: 148 mg/dL — ABNORMAL HIGH (ref 65–99)

## 2016-11-16 LAB — MAGNESIUM: MAGNESIUM: 1.6 mg/dL — AB (ref 1.7–2.4)

## 2016-11-16 LAB — PHOSPHORUS: Phosphorus: 3.2 mg/dL (ref 2.5–4.6)

## 2016-11-16 MED ORDER — ZOLEDRONIC ACID 4 MG/5ML IV CONC
4.0000 mg | Freq: Once | INTRAVENOUS | Status: AC
  Administered 2016-11-16: 4 mg via INTRAVENOUS
  Filled 2016-11-16: qty 5

## 2016-11-16 NOTE — Progress Notes (Signed)
Further discussion with patient and family He has decided he would want to pursue palliative chemo. If calcium level continues to improve, we will try to transfer him to [facility] [place] tomorrow for palliative chemo. I have not discussed chemotherapy chemo plans in detail, but will consider weekly carbo/taxol We discussed code status. He wants full code for now. I have asked him to nominate one family member to be his MPOA.

## 2016-11-16 NOTE — Progress Notes (Signed)
Antonio [name]   DOB:1956/01/27   VZ#:[id number]    Subjective: I went to check on him today.  He denies pain.  He is tolerating tube feeds well. He is still undecided what to do with plan of care.  No reported confusion episodes.  Objective:  Vitals:   11/15/16 2020 11/16/16 0415  BP: 138/72 136/75  Pulse: 97 88  Resp: 20 20  Temp: 98.2 F (36.8 C) 98.4 F (36.9 C)  SpO2: 100% 98%     Intake/Output Summary (Last 24 hours) at 11/16/16 1020 Last data filed at 11/16/16 0600  Gross per 24 hour  Intake             4261 ml  Output              575 ml  Net             3686 ml    GENERAL:alert, no distress and comfortable SKIN: skin color, texture, turgor are normal, no rashes or significant lesions EYES: normal, Conjunctiva are pink and non-injected, sclera clear OROPHARYNX:no exudate, no erythema and lips, buccal mucosa, and tongue normal  NECK: supple, thyroid normal size, non-tender, without nodularity LYMPH:  no palpable lymphadenopathy in the cervical, axillary or inguinal LUNGS: clear to auscultation and percussion with normal breathing effort HEART: regular rate & rhythm and no murmurs and no lower extremity edema ABDOMEN:abdomen soft, non-tender and normal bowel sounds Musculoskeletal:no cyanosis of digits and no clubbing  NEURO: alert & oriented x 3 with mild dysarthria, no focal motor/sensory deficits   Labs:  Lab Results  Component Value Date   WBC 7.4 11/15/2016   HGB 9.3 (L) 11/15/2016   HCT 27.4 (L) 11/15/2016   MCV 80.6 11/15/2016   PLT 308 11/15/2016   NEUTROABS 6.2 11/13/2016    Lab Results  Component Value Date   NA 138 11/16/2016   K 3.8 11/16/2016   CL 103 11/16/2016   CO2 29 11/16/2016    Assessment & Plan:   Recurrent, 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, 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 Unfortunately, the patient has developed recurrent disease within 3 months of stopping treatment At this point in time, even without a  biopsy, this is highly suspicious for recurrent metastatic squamous cell carcinoma from the head and neck region The patient had severe, locally advanced disease before chemoradiation therapy The patient is informed that his disease is no longer curable and any form of treatment is strictly palliative in nature With significant disease diffuse disease, even with chemotherapy, I think his prognosis is very poor Yesterday, we discussed about the role of palliative chemotherapy to treat hypercalcemia and to extend his life.  But he is undecided yesterday and this morning.  I will return again later today  Malignant hypercalcemia He is on aggressive IV fluid resuscitation, IV Lasix and steroids Calcium is mildly improved but still abnormal On November 15, 2016, the dose of steroids was increased along with addition of nasal calcitonin.  It is still not normalized today. I plan to try to give him a dose of Zometa today.  We will continue to monitor daily  Moderate to severe protein calorie malnutrition He has started to resume tube feeding on November 15, 2016 at goal rate The patient is at risk of refeeding syndrome Recommend checking electrolytes carefully  Severe cancer pain, improved  he will continue pain management with morphine sulfate I will add availability of liquid Roxanol to be given through the feeding tube  due to difficulties with swallowing and IV prn morphine for breakthrough pain  Anemia chronic illness Will observe  CODE STATUS He is currently full code We will further discuss goals of care and CODE STATUS later Yesterday, the patient wants CPR but declined ventilator support. We will resume further discussion later today  Discharge planning He is not safe to be discharged until hypercalcemia resolved I will return to check on him later today  [name], MD 11/16/2016  10:20 AM

## 2016-11-16 NOTE — Progress Notes (Addendum)
Progress Note    Antonio [name]  YOV:[id number] DOB: Dec 11, 1955  DOA: 11/13/2016 PCP: Maryellen Pile, MD    Brief Narrative:   Chief complaint: Follow-up dysphasia and hypercalcemia  Medical records reviewed and are as summarized below:  Antonio [name] is an 61 y.o. male with history of supraglottic cancer status post chemoradiation, follows with [name], who was admitted 11/13/16 for worsening throat pain and dysphagia. He was also noted to be hypercalcemic therefore getting treatment with IV Lasix, IV fluids and steroids. Repeat scans a CT head and neck were suggestive of widespread recurrence of malignancy.  Assessment/Plan:   Principal problem:  Throat pain and dysphagia secondary to stage IV supraglottic cancer Oncologist following and has had discussions with the patient regarding his prognosis. Palliative care has evaluated patient and is assisting with comfort measures. He has not yet decided changing his CODE STATUS, and ongoing discussions are being held with him by both palliative care and oncology.  Active problems: Malignant hypercalcemia Continue steroids, IV fluids, IV Lasix. Nasal calcitonin added as well. Calcium trending down, but still elevated.Zometa ordered for today.  Anemia of chronic disease This is in setting of ongoing chronic issues including his cancer.  Mild leukocytosis No signs of active infection. He is on steroids. No antibiotics at this time.  Severe protein calorie malnutrition Body mass index is 22.67 kg/m. Malnutrition a combination of cachexia related to stage IV cancer and poor oral intake due to dysphagia. If patient continues to want aggressive care, and is receiving Osmolite 60 mL/hour through his PEG tube.   Family Communication/Anticipated D/C date and plan/Code Status   DVT prophylaxis: Lovenox ordered. Code Status: Full Code.  Family Communication: No family currently at the bedside. Disposition Plan: Home versus  rehabilitation depending on progress. Nurses report that he is ambulatory.   Medical Consultants:    Oncology  Palliative Care   Anti-Infectives:    None  Subjective:   Patient has ongoing throat pain and dysphagia. Denies abdominal pain, nausea. Tolerating his tube feeds without difficulty.  Objective:    Vitals:   11/15/16 0621 11/15/16 1348 11/15/16 2020 11/16/16 0415  BP: 132/74 136/68 138/72 136/75  Pulse: 92 96 97 88  Resp: 18 18 20 20   Temp: 98.6 F (37 C) 98.3 F (36.8 C) 98.2 F (36.8 C) 98.4 F (36.9 C)  TempSrc: Oral Oral Oral Oral  SpO2: 98% 100% 100% 98%  Weight:      Height:        Intake/Output Summary (Last 24 hours) at 11/16/16 0756 Last data filed at 11/16/16 0600  Gross per 24 hour  Intake             4501 ml  Output              875 ml  Net             3626 ml   Filed Weights   11/14/16 1350  Weight: 58.1 kg (128 lb)    Exam: General: No acute distress. Mildly cachectic. Cardiovascular: Heart sounds show a regular rate, and rhythm. No gallops or rubs. No murmurs. No JVD. Lungs: Diminished breath sounds with fair air movement. No rales, rhonchi or wheezes. Abdomen: Soft, nontender, nondistended with normal active bowel sounds. No masses. No hepatosplenomegaly. PEG tube present. Neurological: Alert and oriented 3. Moves all extremities 4 with equal strength. Cranial nerves II through XII grossly intact. Skin: Warm and dry. No rashes or lesions. Extremities: No clubbing or  cyanosis. No edema. Pedal pulses 2+. Psychiatric: Mood and affect are normal. Insight and judgment are fair.   Data Reviewed:   I have personally reviewed following labs and imaging studies:  Labs: Labs show the following:   Basic Metabolic Panel:  Recent Labs Lab 11/13/16 1338 11/13/16 2204  11/14/16 0628 11/14/16 1638 11/15/16 0504 11/16/16 0446  NA 138  --   --  QUESTIONABLE RESULTS, RECOMMEND RECOLLECT TO VERIFY 139 139 138  K 3.8  --   < >  QUESTIONABLE RESULTS, RECOMMEND RECOLLECT TO VERIFY 3.7 3.7 3.8  CL  --   --   --  QUESTIONABLE RESULTS, RECOMMEND RECOLLECT TO VERIFY 101 103 103  CO2 31*  --   --  QUESTIONABLE RESULTS, RECOMMEND RECOLLECT TO VERIFY 29 30 29   GLUCOSE 126  --   --  QUESTIONABLE RESULTS, RECOMMEND RECOLLECT TO VERIFY 110* 107* 177*  BUN 15.7  --   --  QUESTIONABLE RESULTS, RECOMMEND RECOLLECT TO VERIFY 13 13 16   CREATININE 1.1 1.17  --  QUESTIONABLE RESULTS, RECOMMEND RECOLLECT TO VERIFY 0.93 0.94 0.86  CALCIUM 12.8*  --   --  QUESTIONABLE RESULTS, RECOMMEND RECOLLECT TO VERIFY 11.6* 11.5* 11.1*  MG 2.7*  --   --   --   --   --  1.6*  PHOS  --   --   --   --   --   --  3.2  < > = values in this interval not displayed. GFR Estimated Creatinine Clearance: 73.5 mL/min (by C-G formula based on SCr of 0.86 mg/dL). Liver Function Tests:  Recent Labs Lab 11/13/16 1338 11/16/16 0446  AST 17 17  ALT 12 13*  ALKPHOS 67 55  BILITOT 0.57 0.3  PROT 7.2 6.1*  ALBUMIN 3.4* 3.0*   CBC:  Recent Labs Lab 11/13/16 1340 11/13/16 2204 11/14/16 1638 11/15/16 0504  WBC 7.6 15.4* 8.2 7.4  NEUTROABS 6.2  --   --   --   HGB 9.8* 13.6 9.6* 9.3*  HCT 30.0* 42.8 28.2* 27.4*  MCV 82.6 96.8 79.4 80.6  PLT 278 248 303 308   CBG:  Recent Labs Lab 11/15/16 1620 11/15/16 2023 11/15/16 2339 11/16/16 0417 11/16/16 0751  GLUCAP 166* 181* 145* 169* 127*    Microbiology No results found for this or any previous visit (from the past 240 hour(s)).  Procedures and diagnostic studies:  No results found.  Medications:   . calcitonin (salmon)  1 spray Alternating Nares Daily  . cyclobenzaprine  10 mg Oral QHS  . dexamethasone  4 mg Intravenous Q6H  . enoxaparin (LOVENOX) injection  40 mg Subcutaneous Q24H  . free water  100 mL Per Tube Q4H  . furosemide  20 mg Intravenous Daily  . gabapentin  300 mg Oral TID   Continuous Infusions: . sodium chloride 125 mL/hr at 11/15/16 1517  . feeding supplement (OSMOLITE  1.5 CAL) 1,000 mL (11/16/16 0712)     LOS: 3 days   Nariya Neumeyer  [facility] Pager [phone]. If unable to reach me by pager, please call my cell phone at [phone].  *Please refer to [email or web address], password [email or web address] to get updated schedule on who will round on this patient, as hospitalists switch teams weekly. If 7PM-7AM, please contact night-coverage at [email or web address], password [email or web address] for any overnight needs.  11/16/2016, 7:56 AM

## 2016-11-16 NOTE — Evaluation (Signed)
Physical Therapy Evaluation Patient Details Name: Antonio [name] MRN: [id number] DOB: [birth year] Today's Date: 11/16/2016   History of Present Illness  61 yo male admitted with weakness, fatigue, hypercalcemia. 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 He walked ~450 feet while pushing his IV pole. No LOB. Encouraged pt to walk in hallway with nursing/family at least once a day. Will follow during hospital stay. Do not anticipate any f/u PT needs at this time-will continue to assess.     Follow Up Recommendations Supervision - Intermittent;No PT follow up (will continue to assess)    Equipment Recommendations  None recommended by PT    Recommendations for Other Services       Precautions / Restrictions Precautions Precautions: Fall Restrictions Weight Bearing Restrictions: No      Mobility  Bed Mobility               General bed mobility comments: sitting EOB  Transfers Overall transfer level: Needs assistance   Transfers: Sit to/from Stand Sit to Stand: Supervision         General transfer comment: for safety, lines  Ambulation/Gait Ambulation/Gait assistance: Supervision Ambulation Distance (Feet): 450 Feet Assistive device:  (IV pole) Gait Pattern/deviations: Step-through pattern     General Gait Details: for safety. Pt pushed IV pole. No LOB. He tolerated distance well.   Stairs            Wheelchair Mobility    Modified Rankin (Stroke Patients Only)       Balance                                             Pertinent Vitals/Pain Pain Assessment: No/denies pain    Home Living Family/patient expects to be discharged to:: Private residence Living Arrangements: Alone Available Help at Discharge: Family;Available PRN/intermittently           Home Equipment: Kasandra Knudsen - single point      Prior Function Level of  Independence: Independent with assistive device(s)         Comments: uses cane PRN     Hand Dominance        Extremity/Trunk Assessment   Upper Extremity Assessment Upper Extremity Assessment: Generalized weakness    Lower Extremity Assessment Lower Extremity Assessment: Generalized weakness    Cervical / Trunk Assessment Cervical / Trunk Assessment: Normal  Communication   Communication: Expressive difficulties  Cognition Arousal/Alertness: Awake/alert Behavior During Therapy: WFL for tasks assessed/performed Overall Cognitive Status: Within Functional Limits for tasks assessed                                        General Comments      Exercises     Assessment/Plan    PT Assessment Patient needs continued PT services  PT Problem List Decreased mobility;Decreased strength       PT Treatment Interventions Gait training;Therapeutic exercise;Patient/family education;Therapeutic activities;Functional mobility training    PT Goals (Current goals can be found in the Care Plan section)  Acute Rehab PT Goals Patient Stated Goal: none stated PT Goal Formulation: With patient/family Time For Goal Achievement: 11/30/16 Potential to Achieve Goals: Good    Frequency Min 3X/week  Barriers to discharge        Co-evaluation               AM-PAC PT "6 Clicks" Daily Activity  Outcome Measure Difficulty turning over in bed (including adjusting bedclothes, sheets and blankets)?: None Difficulty moving from lying on back to sitting on the side of the bed? : None Difficulty sitting down on and standing up from a chair with arms (e.g., wheelchair, bedside commode, etc,.)?: None Help needed moving to and from a bed to chair (including a wheelchair)?: None Help needed walking in hospital room?: A Little Help needed climbing 3-5 steps with a railing? : A Little 6 Click Score: 22    End of Session   Activity Tolerance: Patient tolerated  treatment well Patient left: in bed;with call bell/phone within reach;with family/visitor present (sitting EOB)   PT Visit Diagnosis: Difficulty in walking, not elsewhere classified (R26.2);Muscle weakness (generalized) (M62.81)    Time: 5329-9242 PT Time Calculation (min) (ACUTE ONLY): 9 min   Charges:   PT Evaluation $PT Eval Low Complexity: 1 Low     PT G Codes:          Weston Anna, MPT Pager: [phone]

## 2016-11-16 NOTE — Consult Note (Addendum)
   [facility] Inpatient Consult   11/16/2016  Antonio [name] May 13, 1955 [id number]   [name] is active with [facility] program.   Please see chart review tab then encounters for further patient outreach details.   Spoke with [name] at bedside. He is agreeable to ongoing [facility] services. He reports he has a home visit scheduled next week with [facility].   Left contact information at bedside.   Will update [facility] RNCM,  Will make inpatient RNCM aware [facility] active.     Marthenia Rolling, MSN-Ed, RN,BSN [facility] Liaison [phone]

## 2016-11-16 NOTE — Care Management Important Message (Signed)
Important Message  Patient Details  Name: Antonio [name] MRN: [id number] Date of Birth: 1955/03/07   Medicare Important Message Given:  Yes    Kerin Salen 11/16/2016, 11:41 AMImportant Message  Patient Details  Name: Antonio [name] MRN: [id number] Date of Birth: [birth year]   Medicare Important Message Given:  Yes    Kerin Salen 11/16/2016, 11:41 AM

## 2016-11-17 ENCOUNTER — Other Ambulatory Visit: Payer: Self-pay | Admitting: Hematology and Oncology

## 2016-11-17 DIAGNOSIS — C799 Secondary malignant neoplasm of unspecified site: Secondary | ICD-10-CM

## 2016-11-17 DIAGNOSIS — R627 Adult failure to thrive: Secondary | ICD-10-CM

## 2016-11-17 DIAGNOSIS — C770 Secondary and unspecified malignant neoplasm of lymph nodes of head, face and neck: Secondary | ICD-10-CM

## 2016-11-17 DIAGNOSIS — Z5111 Encounter for antineoplastic chemotherapy: Secondary | ICD-10-CM

## 2016-11-17 DIAGNOSIS — R1319 Other dysphagia: Secondary | ICD-10-CM

## 2016-11-17 LAB — CBC WITH DIFFERENTIAL/PLATELET
Basophils Absolute: 0 10*3/uL (ref 0.0–0.1)
Basophils Relative: 0 %
EOS ABS: 0 10*3/uL (ref 0.0–0.7)
Eosinophils Relative: 0 %
HCT: 27.6 % — ABNORMAL LOW (ref 39.0–52.0)
HEMOGLOBIN: 9.4 g/dL — AB (ref 13.0–17.0)
LYMPHS ABS: 0.6 10*3/uL — AB (ref 0.7–4.0)
Lymphocytes Relative: 4 %
MCH: 27.3 pg (ref 26.0–34.0)
MCHC: 34.1 g/dL (ref 30.0–36.0)
MCV: 80.2 fL (ref 78.0–100.0)
MONOS PCT: 3 %
Monocytes Absolute: 0.4 10*3/uL (ref 0.1–1.0)
NEUTROS PCT: 93 %
Neutro Abs: 13.5 10*3/uL — ABNORMAL HIGH (ref 1.7–7.7)
Platelets: 296 10*3/uL (ref 150–400)
RBC: 3.44 MIL/uL — ABNORMAL LOW (ref 4.22–5.81)
RDW: 15.1 % (ref 11.5–15.5)
WBC: 14.4 10*3/uL — ABNORMAL HIGH (ref 4.0–10.5)

## 2016-11-17 LAB — BASIC METABOLIC PANEL
Anion gap: 7 (ref 5–15)
BUN: 15 mg/dL (ref 6–20)
CHLORIDE: 102 mmol/L (ref 101–111)
CO2: 28 mmol/L (ref 22–32)
CREATININE: 0.72 mg/dL (ref 0.61–1.24)
Calcium: 10.3 mg/dL (ref 8.9–10.3)
GFR calc non Af Amer: >60 mL/min (ref >60)
Glucose, Bld: 152 mg/dL — ABNORMAL HIGH (ref 65–99)
Potassium: 3.5 mmol/L (ref 3.5–5.1)
SODIUM: 137 mmol/L (ref 135–145)

## 2016-11-17 LAB — GLUCOSE, CAPILLARY
GLUCOSE-CAPILLARY: 117 mg/dL — AB (ref 65–99)
GLUCOSE-CAPILLARY: 170 mg/dL — AB (ref 65–99)
GLUCOSE-CAPILLARY: 173 mg/dL — AB (ref 65–99)
Glucose-Capillary: 127 mg/dL — ABNORMAL HIGH (ref 65–99)
Glucose-Capillary: 121 mg/dL — ABNORMAL HIGH (ref 65–99)

## 2016-11-17 LAB — MAGNESIUM: MAGNESIUM: 1.5 mg/dL — AB (ref 1.7–2.4)

## 2016-11-17 MED ORDER — COLD PACK MISC ONCOLOGY: 1.0000 | Freq: Once | Status: AC | PRN

## 2016-11-17 MED ORDER — DIPHENHYDRAMINE HCL 50 MG/ML IJ SOLN: 50.0000 mg | Freq: Once | INTRAMUSCULAR | Status: DC

## 2016-11-17 MED ORDER — POTASSIUM CHLORIDE 20 MEQ/15ML (10%) PO SOLN
40.0000 meq | Freq: Once | ORAL | Status: AC
  Administered 2016-11-17: 40 meq
  Filled 2016-11-17: qty 30

## 2016-11-17 MED ORDER — FAMOTIDINE IN NACL 20-0.9 MG/50ML-% IV SOLN
20.0000 mg | Freq: Once | INTRAVENOUS | Status: AC
  Administered 2016-11-17: 20 mg via INTRAVENOUS
  Filled 2016-11-17: qty 50

## 2016-11-17 MED ORDER — SODIUM CHLORIDE 0.9% FLUSH: 10.0000 mL | INTRAVENOUS | Status: DC | PRN

## 2016-11-17 MED ORDER — SODIUM CHLORIDE 0.9 % IV SOLN
Freq: Once | INTRAVENOUS | Status: DC
Start: 2016-11-17 — End: 2016-11-19

## 2016-11-17 MED ORDER — HEPARIN SOD (PORK) LOCK FLUSH 100 UNIT/ML IV SOLN: 500.0000 [IU] | Freq: Once | INTRAVENOUS | Status: DC | PRN

## 2016-11-17 MED ORDER — SODIUM CHLORIDE 0.9 % IV SOLN
207.6000 mg | Freq: Once | INTRAVENOUS | Status: AC
  Administered 2016-11-17: 210 mg via INTRAVENOUS
  Filled 2016-11-17: qty 21

## 2016-11-17 MED ORDER — ALBUTEROL SULFATE (2.5 MG/3ML) 0.083% IN NEBU
2.5000 mg | INHALATION_SOLUTION | Freq: Once | RESPIRATORY_TRACT | Status: DC | PRN
Start: 2016-11-17 — End: 2016-11-19

## 2016-11-17 MED ORDER — PALONOSETRON HCL INJECTION 0.25 MG/5ML
0.2500 mg | Freq: Once | INTRAVENOUS | Status: AC
  Administered 2016-11-17: 0.25 mg via INTRAVENOUS
  Filled 2016-11-17: qty 5

## 2016-11-17 MED ORDER — EPINEPHRINE PF 1 MG/10ML IJ SOSY: 0.2500 mg | PREFILLED_SYRINGE | Freq: Once | INTRAMUSCULAR | Status: DC | PRN

## 2016-11-17 MED ORDER — DIPHENHYDRAMINE HCL 50 MG/ML IJ SOLN
50.0000 mg | Freq: Once | INTRAMUSCULAR | Status: DC | PRN
Start: 2016-11-17 — End: 2016-11-19

## 2016-11-17 MED ORDER — FAMOTIDINE IN NACL 20-0.9 MG/50ML-% IV SOLN: 20.0000 mg | Freq: Once | INTRAVENOUS | Status: DC | PRN

## 2016-11-17 MED ORDER — EPINEPHRINE PF 1 MG/ML IJ SOLN: 0.5000 mg | Freq: Once | INTRAMUSCULAR | Status: DC | PRN

## 2016-11-17 MED ORDER — PACLITAXEL CHEMO INJECTION 300 MG/50ML
80.0000 mg/m2 | Freq: Once | INTRAVENOUS | Status: AC
  Administered 2016-11-17: 126 mg via INTRAVENOUS
  Filled 2016-11-17: qty 21

## 2016-11-17 MED ORDER — SODIUM CHLORIDE 0.9 % IV SOLN: Freq: Once | INTRAVENOUS | Status: DC | PRN

## 2016-11-17 MED ORDER — ALTEPLASE 2 MG IJ SOLR: 2.0000 mg | Freq: Once | INTRAMUSCULAR | Status: DC | PRN

## 2016-11-17 MED ORDER — OSMOLITE 1.5 CAL PO LIQD
237.0000 mL | Freq: Every day | ORAL | Status: DC
  Administered 2016-11-17 – 2016-11-18 (×3): 237 mL
  Filled 2016-11-17 (×9): qty 237

## 2016-11-17 MED ORDER — SODIUM CHLORIDE 0.9% FLUSH: 3.0000 mL | INTRAVENOUS | Status: DC | PRN

## 2016-11-17 MED ORDER — METHYLPREDNISOLONE SODIUM SUCC 125 MG IJ SOLR: 125.0000 mg | Freq: Once | INTRAMUSCULAR | Status: DC | PRN

## 2016-11-17 MED ORDER — SODIUM CHLORIDE 0.9 % IV SOLN
20.0000 mg | Freq: Once | INTRAVENOUS | Status: DC
  Filled 2016-11-17: qty 2

## 2016-11-17 MED ORDER — DIPHENHYDRAMINE HCL 50 MG/ML IJ SOLN: 25.0000 mg | Freq: Once | INTRAMUSCULAR | Status: DC | PRN

## 2016-11-17 MED ORDER — HEPARIN SOD (PORK) LOCK FLUSH 100 UNIT/ML IV SOLN: 250.0000 [IU] | Freq: Once | INTRAVENOUS | Status: DC | PRN

## 2016-11-17 NOTE — Progress Notes (Signed)
Report called to Jocelyn Lamer on [place].  Patient transported in stable medical condition.

## 2016-11-17 NOTE — Progress Notes (Signed)
Chemo dosages and calculations checked with vicki Herbst RN,

## 2016-11-17 NOTE — Progress Notes (Signed)
Antonio [name]   DOB:Aug 17, 1955   VZ#:[id number]    Subjective: The patient feels good.  Denies pain. He tolerated nutritional feeding well.  Objective:  Vitals:   11/16/16 2033 11/17/16 0451  BP: (!) 146/75 (!) 142/63  Pulse: 89 91  Resp: 20 18  Temp: 98.5 F (36.9 C) 98.7 F (37.1 C)  SpO2: 95% 96%     Intake/Output Summary (Last 24 hours) at 11/17/16 3295 Last data filed at 11/17/16 0543  Gross per 24 hour  Intake          2547.58 ml  Output              975 ml  Net          1572.58 ml    GENERAL:alert, no distress and comfortable SKIN: skin color, texture, turgor are normal, no rashes or significant lesions EYES: normal, Conjunctiva are pink and non-injected, sclera clear Musculoskeletal:no cyanosis of digits and no clubbing  NEURO: alert & oriented x 3 with fluent speech, no focal motor/sensory deficits   Labs:  Lab Results  Component Value Date   WBC 14.4 (H) 11/17/2016   HGB 9.4 (L) 11/17/2016   HCT 27.6 (L) 11/17/2016   MCV 80.2 11/17/2016   PLT 296 11/17/2016   NEUTROABS 13.5 (H) 11/17/2016    Lab Results  Component Value Date   NA 137 11/17/2016   K 3.5 11/17/2016   CL 102 11/17/2016   CO2 28 11/17/2016    Assessment & Plan:   Recurrent, 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, 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 Unfortunately, the patient has developed recurrent disease within 3 months of stopping treatment At this point in time, even without a biopsy, this is highly suspicious for recurrent metastatic squamous cell carcinoma from the head and neck region The patient had severe, locally advanced disease before chemoradiation therapy The patient is informed that his disease is no longer curable and any form of treatment is strictly palliative in nature With significant disease diffuse disease, even with chemotherapy, I think his prognosis is very poor Yesterday, we discussed about the role of palliative chemotherapy to treat hypercalcemia  and to extend his life.  After much discussion, the patient agreed to proceed with palliative chemotherapy.  We discussed some of the risks, benefits, side-effects of carboplatin and paclitaxel given in a weekly fashion.  I would reserve immunotherapy to the future as I believe he should benefit more from chemotherapy rather than immunotherapy due to early disease relapse.  Some of the short term side-effects included, though not limited to, including weight loss, life threatening infections, risk of allergic reactions, need for transfusions of blood products, nausea, vomiting, change in bowel habits, loss of hair, admission to hospital for various reasons, and risks of death.   Long term side-effects are also discussed including risks of infertility, permanent damage to nerve function, hearing loss, chronic fatigue, kidney damage with possibility needing hemodialysis, and rare secondary malignancy including bone marrow disorders.  The patient is aware that the response rates discussed earlier is not guaranteed.  After a long discussion, patient made an informed decision to proceed with the prescribed plan of care.  I will get the patient to be transferred to the third floor, oncology unit We will discontinue telemetry  Malignant hypercalcemia, resolved He is on aggressive IV fluid resuscitation, IV Lasix and steroids On November 15, 2016, the dose of steroids was increased along with addition of nasal calcitonin.  Finally, he received 1 dose  of IV Zometa on November 16, 2016.   I recommend we continue to monitor his calcium level daily. Hopefully, with chemotherapy, hypercalcemia will not recur He will continue IV fluids, IV Lasix and steroids for now with plan to transition him to oral medications over the next 2 days  Moderate to severe protein calorie malnutrition He has started to resume tube feeding on November 15, 2016 at goal rate Recommend checking electrolytes carefully I recommend  transitioning him back to bolus feeding if possible before discharge  Severe cancer pain, improved  he will continue pain management with morphine sulfate I will add availability of liquid Roxanol to be given through the feeding tube due to difficulties with swallowing and IV prn morphine for breakthrough pain  Anemia chronic illness Will observe  CODE STATUS He is currently full code Recommend the patient to appoint one family member for his medical healthcare power of attorney  Discharge planning Hopefully, he will be discharged over the next 2 days after chemotherapy and resolution of hypercalcemia  [name], MD 11/17/2016  8:29 AM

## 2016-11-17 NOTE — Progress Notes (Signed)
Daily Progress Note   Patient Name: Antonio [name]       Date: 11/17/2016 DOB: 06-14-1955  Age: 61 y.o. MRN#: [id number] Attending Physician: Florencia Reasons, MD Primary Care Physician: Maryellen Pile, MD Admit Date: 11/13/2016  Reason for Consultation/Follow-up: Establishing goals of care  Subjective:  awake alert Now on med oncology floor, in anticipation of chemotherapy No family at bedside Pain well controlled Denies sore throat currently See below   Length of Stay: 4  Current Medications: Scheduled Meds:  . CARBOplatin  210 mg Intravenous Once  . cyclobenzaprine  10 mg Oral QHS  . dexamethasone  4 mg Intravenous Q6H  . diphenhydrAMINE  50 mg Intravenous Once  . enoxaparin (LOVENOX) injection  40 mg Subcutaneous Q24H  . feeding supplement (OSMOLITE 1.5 CAL)  237 mL Per Tube 6 X Daily  . free water  100 mL Per Tube Q4H  . furosemide  20 mg Intravenous Daily  . gabapentin  300 mg Oral TID  . PACLitaxel  80 mg/m2 (Treatment Plan Recorded) Intravenous Once  . palonosetron  0.25 mg Intravenous Once    Continuous Infusions: . sodium chloride 125 mL/hr at 11/17/16 1243  . sodium chloride    . sodium chloride    . dexamethasone (DECADRON) IVPB [facility]    . famotidine    . famotidine      PRN Meds: sodium chloride, albuterol, alteplase, Cold Pack, diphenhydrAMINE, diphenhydrAMINE, diphenhydrAMINE, EPINEPHrine, EPINEPHrine, EPINEPHrine, EPINEPHrine, famotidine, heparin lock flush, heparin lock flush, lidocaine, lip balm, methylPREDNISolone sodium succinate, morphine, morphine injection, morphine CONCENTRATE, ondansetron **OR** ondansetron (ZOFRAN) IV, sodium chloride flush, sodium chloride flush, sodium chloride flush  Physical Exam         NAD Awake alert Regular S1 S2 Abdomen  soft, has PEG No edema Non focal  Vital Signs: BP (!) 156/80 (BP Location: Left Arm)   Pulse 86   Temp 98.4 F (36.9 C) (Oral)   Resp 18   Ht 5\' 3"  (1.6 m)   Wt 57.3 kg (126 lb 5.2 oz)   SpO2 100%   BMI 22.38 kg/m  SpO2: SpO2: 100 % O2 Device: O2 Device: Not Delivered O2 Flow Rate:    Intake/output summary:  Intake/Output Summary (Last 24 hours) at 11/17/16 1329 Last data filed at 11/17/16 1118  Gross per 24 hour  Intake  2547.58 ml  Output             1775 ml  Net           772.58 ml   LBM: Last BM Date: 11/16/16 Baseline Weight: Weight: 58.1 kg (128 lb) Most recent weight: Weight: 57.3 kg (126 lb 5.2 oz)       Palliative Assessment/Data: PPS 30%   Flowsheet Rows     Most Recent Value  Intake Tab  Referral Department  Hospitalist  Unit at Time of Referral  Oncology Unit  Palliative Care Primary Diagnosis  Cancer  Palliative Care Type  New Palliative care  Reason for referral  Clarify Goals of Care  Clinical Assessment  Palliative Performance Scale Score  40%  Pain Max last 24 hours  4  Pain Min Last 24 hours  3  Psychosocial & Spiritual Assessment  Palliative Care Outcomes      Patient Active Problem List   Diagnosis Date Noted  . Dysphagia   . Failure to thrive in adult   . Metastatic squamous cell carcinoma ([place])   . Encounter for palliative care   . Hypercalcemia 11/13/2016  . Hypercalcemia of malignancy 11/07/2016  . Port catheter in place 11/06/2016  . Acquired lymphedema 09/15/2016  . Anemia, chronic disease 08/30/2016  . Prerenal acute renal failure ([place]) 08/26/2016  . Pancytopenia, acquired ([place]) 07/27/2016  . Nausea without vomiting 07/27/2016  . Other constipation 07/20/2016  . 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 Malignant neoplasm of lower lobe of right lung ([place]) 06/28/2016  . Cancer associated pain 05/31/2016  . Weight loss 05/31/2016  . Dysarthria 05/31/2016  . 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[place]) 05/18/2016   . Sciatic leg pain 02/24/2015  . Goals of care, counseling/discussion 12/01/2014  . Hyperlipidemia 02/20/2013  . Varus deformity of knee 06/16/2011  . COLONIC POLYPS 03/14/2006  . Allergic rhinitis 01/19/2006  . COPD 01/19/2006  . Essential hypertension 01/11/2006  . 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 Malignant hypercalcemia Dysphagia Cancer related pain.   Recommendations/Plan:  Appreciate and agree with Dr Calton Dach input and recommendations, agree with current pain regimen.   Patient elects his sister "[name]" to be his designated health care power of attorney agent.   Chaplain consult to help complete advanced directives   Code Status:    Code Status Orders        Start     Ordered   11/13/16 2126  Full code  Continuous     11/13/16 2125    Code Status History    Date Active Date Inactive Code Status Order ID Comments User Context   05/17/2016  4:36 PM 05/19/2016  5:53 PM Full Code [id number]  Burgess Estelle, MD Inpatient       Prognosis:   Unable to determine  Discharge Planning:  To Be Determined  Care plan was discussed with  Patient, RN  Thank you for allowing the Palliative Medicine Team to assist in the care of this patient.   Time In: 1300 Time Out: 1325 Total Time 25 Prolonged Time Billed  no       Greater than 50%  of this time was spent counseling and coordinating care related to the above assessment and plan.  Loistine Chance, MD [phone]  Please contact Palliative Medicine Team  phone at [phone] for questions and concerns.

## 2016-11-17 NOTE — Progress Notes (Signed)
DISCONTINUE OFF PATHWAY REGIMEN - Head and Neck   [id number]:Cisplatin 100 mg/m2 q21d:   A cycle is every 21 days:     Cisplatin   **Always confirm dose/schedule in your pharmacy ordering system**    REASON: Disease Progression PRIOR TREATMENT: Off Pathway: Cisplatin 100 mg/m2 q21d TREATMENT RESPONSE: Progressive Disease (PD)  START OFF PATHWAY REGIMEN - Head and Neck   [id number]:Carboplatin + Paclitaxel (2/80) weekly:   Administer weekly:     Paclitaxel      Carboplatin   **Always confirm dose/schedule in your pharmacy ordering system**    Patient Characteristics: Hypopharynx, Metastatic (Squamous Cell), Second Line Disease Classification: Hypopharynx Current Disease Status: Metastatic Disease AJCC M Category: M1 AJCC N Category: cN3 AJCC T Category: T4 AJCC 8 Stage Grouping: IVC Line of therapy: Second Line Would you be surprised if this patient died  in the next year<= I would be surprised if this patient died in the next year Intent of Therapy: Non-Curative / Palliative Intent, Discussed with Patient

## 2016-11-17 NOTE — Progress Notes (Addendum)
Nutrition Follow-up  DOCUMENTATION CODES:   Severe malnutrition in context of chronic illness  INTERVENTION:  Osmolite 1.5 at 240 ml x6 times per day.  Water flushes at 60 ml before and after each feeding Pt to drink or flush feeding with additional 200 ml BID (when IVF d/c)  Provides: 2160 kcals, 90 grams protein, 1094 ml free water (2214 ml with flushes). This meets 100% of calorie needs and 100% of protein needs.  Monitor and supplement electrolytes as need per MD descretion.   NUTRITION DIAGNOSIS:   Malnutrition (Severe) related to chronic illness, cancer and cancer related treatments as evidenced by percent weight loss, moderate depletion of body fat, severe depletion of muscle mass.  Ongoing  GOAL:   Patient will meet greater than or equal to 90% of their needs  Meeting with tube feeding  MONITOR:   TF tolerance, Supplement acceptance, PO intake, Labs, Weight trends  REASON FOR ASSESSMENT:   Malnutrition Screening Tool    ASSESSMENT:   Pt with PMH significant for supraglottic cancer s/p radiation/chemotherapy, COPD, and HTN. Presented this from cancer center for evaluation of worsening throat pain and dysphagia. At cancer center patient was found to have elevated Ca level concerning of cancer recurrence and was admitted for further evaluation/treatment.    RD consulted for TF bolus management. See recommendations above. Pt currently consuming 50% of clear liquid diet. Tolerating Osmolite 1.5 @ 60 ml/hr. RD added water flush recommendation to bolus schedule. Pt reports drinking cranberry juice regularly. Pt may not need additional water flushes throughout the day if he continues to drink fluids. Wt noted to be down 3 lbs since beginning of admission. Nursing to administer bolus feedings prior to discharge.   Medications reviewed and include: lasix, NS @ 125 ml/hr Labs reviewed: CBG 152-177 Mg 1.5 (L)  Diet Order:  Diet clear liquid Room service appropriate? Yes;  Fluid consistency: Thin  Skin:  Reviewed, no issues  Last BM:  11/16/16  Height:   Ht Readings from Last 1 Encounters:  11/14/16 5\' 3"  (1.6 m)    Weight:   Wt Readings from Last 1 Encounters:  11/16/16 125 lb (56.7 kg)    Ideal Body Weight:  56.4 kg  BMI:  Body mass index is 22.14 kg/m.  Estimated Nutritional Needs:   Kcal:  2040-2240 (35-38 kcal/kg)  Protein:  88-105 grams (1.5-1.8 g/kg)  Fluid:  >2.2 L/day  EDUCATION NEEDS:   No education needs identified at this time  [place], LDN Clinical Nutrition Pager # - [phone]

## 2016-11-17 NOTE — Progress Notes (Signed)
Progress Note    Antonio [name]  QQP:[id number] DOB: Dec 08, 1955  DOA: 11/13/2016 PCP: Maryellen Pile, MD    Brief Narrative:   Chief complaint: Follow-up dysphasia and hypercalcemia  Medical records reviewed and are as summarized below:  Antonio [name] is an 61 y.o. male with history of supraglottic cancer status post chemoradiation, follows with [name], who was admitted 11/13/16 for worsening throat pain and dysphagia. He was also noted to be hypercalcemic therefore getting treatment with IV Lasix, IV fluids and steroids. Repeat scans a CT head and neck were suggestive of widespread recurrence of malignancy.  Assessment/Plan:   Principal problem:  Throat pain and dysphagia secondary to stage IV supraglottic cancer Oncologist following and has had discussions with the patient regarding his prognosis. Palliative care has evaluated patient and is assisting with comfort measures. He has not yet decided changing his CODE STATUS, and ongoing discussions are being held with him by both palliative care and oncology. Patient decided to go ahead with palliative chemo.  Active problems: Malignant hypercalcemia Continue steroids, IV fluids, IV Lasix.   Calcium trending down, but still elevated.Zometa x1 on 10/4 Ca 10.5  Anemia of chronic disease This is in setting of ongoing chronic issues including his cancer.  Mild leukocytosis No signs of active infection. He is on steroids. No antibiotics at this time.  Severe protein calorie malnutrition Body mass index is 22.14 kg/m. Malnutrition a combination of cachexia related to stage IV cancer and poor oral intake due to dysphagia. If patient continues to want aggressive care, and is receiving Osmolite 60 mL/hour through his PEG tube. Nutrition to see to transition to bolus feeds to facilitate discharge   Family Communication/Anticipated D/C date and plan/Code Status   DVT prophylaxis: Lovenox ordered. Code Status: Full Code.   Family Communication: family currently at the bedside. Disposition Plan: Home versus rehabilitation depending on progress. Nurses report that he is ambulatory.   Medical Consultants:    Oncology  Palliative Care   Anti-Infectives:    None  Subjective:   Sitting up in bed, multiple family in room, left leg pain  Patient has ongoing throat pain and dysphagia. Denies abdominal pain, nausea. Tolerating his tube feeds without difficulty.  Objective:    Vitals:   11/16/16 1449 11/16/16 1700 11/16/16 2033 11/17/16 0451  BP: 136/79  (!) 146/75 (!) 142/63  Pulse: 96  89 91  Resp: 20  20 18   Temp: 98.5 F (36.9 C)  98.5 F (36.9 C) 98.7 F (37.1 C)  TempSrc: Oral  Oral Oral  SpO2: 98%  95% 96%  Weight:  56.7 kg (125 lb)    Height:        Intake/Output Summary (Last 24 hours) at 11/17/16 0838 Last data filed at 11/17/16 0543  Gross per 24 hour  Intake          2547.58 ml  Output              975 ml  Net          1572.58 ml   Filed Weights   11/14/16 1350 11/16/16 1700  Weight: 58.1 kg (128 lb) 56.7 kg (125 lb)    Exam: General: No acute distress. Mildly cachectic. Cardiovascular: Heart sounds show a regular rate, and rhythm. No gallops or rubs. No murmurs. No JVD. Lungs: Diminished breath sounds with fair air movement. No rales, rhonchi or wheezes. Abdomen: Soft, nontender, nondistended with normal active bowel sounds. No masses. No hepatosplenomegaly. PEG tube present.  Neurological: Alert and oriented 3. Moves all extremities 4 with equal strength. Cranial nerves II through XII grossly intact. Skin: Warm and dry. No rashes or lesions. Extremities: No clubbing or cyanosis. No edema. Pedal pulses 2+. Psychiatric: Mood and affect are normal. Insight and judgment are fair.   Data Reviewed:   I have personally reviewed following labs and imaging studies:  Labs: Labs show the following:   Basic Metabolic Panel:  Recent Labs Lab 11/13/16 1338   11/14/16 0628 11/14/16 1638 11/15/16 0504 11/16/16 0446 11/17/16 0500  NA 138  --  QUESTIONABLE RESULTS, RECOMMEND RECOLLECT TO VERIFY 139 139 138 137  K 3.8  < > QUESTIONABLE RESULTS, RECOMMEND RECOLLECT TO VERIFY 3.7 3.7 3.8 3.5  CL  --   --  QUESTIONABLE RESULTS, RECOMMEND RECOLLECT TO VERIFY 101 103 103 102  CO2 31*  --  QUESTIONABLE RESULTS, RECOMMEND RECOLLECT TO VERIFY 29 30 29 28   GLUCOSE 126  --  QUESTIONABLE RESULTS, RECOMMEND RECOLLECT TO VERIFY 110* 107* 177* 152*  BUN 15.7  --  QUESTIONABLE RESULTS, RECOMMEND RECOLLECT TO VERIFY 13 13 16 15   CREATININE 1.1  < > QUESTIONABLE RESULTS, RECOMMEND RECOLLECT TO VERIFY 0.93 0.94 0.86 0.72  CALCIUM 12.8*  --  QUESTIONABLE RESULTS, RECOMMEND RECOLLECT TO VERIFY 11.6* 11.5* 11.1* 10.3  MG 2.7*  --   --   --   --  1.6*  --   PHOS  --   --   --   --   --  3.2  --   < > = values in this interval not displayed. GFR Estimated Creatinine Clearance: 78.8 mL/min (by C-G formula based on SCr of 0.72 mg/dL). Liver Function Tests:  Recent Labs Lab 11/13/16 1338 11/16/16 0446  AST 17 17  ALT 12 13*  ALKPHOS 67 55  BILITOT 0.57 0.3  PROT 7.2 6.1*  ALBUMIN 3.4* 3.0*   CBC:  Recent Labs Lab 11/13/16 1340 11/13/16 2204 11/14/16 1638 11/15/16 0504 11/17/16 0500  WBC 7.6 15.4* 8.2 7.4 14.4*  NEUTROABS 6.2  --   --   --  13.5*  HGB 9.8* 13.6 9.6* 9.3* 9.4*  HCT 30.0* 42.8 28.2* 27.4* 27.6*  MCV 82.6 96.8 79.4 80.6 80.2  PLT 278 248 303 308 296   CBG:  Recent Labs Lab 11/16/16 1720 11/16/16 2034 11/17/16 0014 11/17/16 0448 11/17/16 0721  GLUCAP 138* 148* 173* 170* 127*    Microbiology No results found for this or any previous visit (from the past 240 hour(s)).  Procedures and diagnostic studies:  No results found.  Medications:   . calcitonin (salmon)  1 spray Alternating Nares Daily  . cyclobenzaprine  10 mg Oral QHS  . dexamethasone  4 mg Intravenous Q6H  . enoxaparin (LOVENOX) injection  40 mg Subcutaneous  Q24H  . free water  100 mL Per Tube Q4H  . furosemide  20 mg Intravenous Daily  . gabapentin  300 mg Oral TID   Continuous Infusions: . sodium chloride 125 mL/hr at 11/17/16 0531  . feeding supplement (OSMOLITE 1.5 CAL) 1,000 mL (11/17/16 0535)     LOS: 4 days   Zoltan Genest MD PhD  [facility] Pager [phone]  *Please refer to [email or web address], password [email or web address] to get updated schedule on who will round on this patient, as hospitalists switch teams weekly. If 7PM-7AM, please contact night-coverage at [email or web address], password [email or web address] for any overnight needs.  11/17/2016, 8:38 AM

## 2016-11-17 NOTE — Progress Notes (Signed)
Patient tolerated chemo well, patient monitored every 15 min while taxol was started. No reaction noted.

## 2016-11-17 NOTE — Progress Notes (Signed)
Date:  November 17, 2016 Chart reviewed for concurrent status and case management needs.  Will continue to follow patient progress.  Discharge Planning: following for needs  Expected discharge date: November 20, 2016  Johnmark Geiger, BSN, RN3, CCM   [phone]  

## 2016-11-18 LAB — BASIC METABOLIC PANEL
ANION GAP: 7 (ref 5–15)
BUN: 16 mg/dL (ref 6–20)
CALCIUM: 9.3 mg/dL (ref 8.9–10.3)
CO2: 28 mmol/L (ref 22–32)
Chloride: 103 mmol/L (ref 101–111)
Creatinine, Ser: 0.82 mg/dL (ref 0.61–1.24)
GFR calc Af Amer: >60 mL/min (ref >60)
GFR calc non Af Amer: >60 mL/min (ref >60)
GLUCOSE: 87 mg/dL (ref 65–99)
Potassium: 3.8 mmol/L (ref 3.5–5.1)
SODIUM: 138 mmol/L (ref 135–145)

## 2016-11-18 LAB — CBC WITH DIFFERENTIAL/PLATELET
BASOS ABS: 0 10*3/uL (ref 0.0–0.1)
Basophils Relative: 0 %
EOS ABS: 0 10*3/uL (ref 0.0–0.7)
EOS PCT: 0 %
HCT: 27.9 % — ABNORMAL LOW (ref 39.0–52.0)
Hemoglobin: 9.6 g/dL — ABNORMAL LOW (ref 13.0–17.0)
LYMPHS PCT: 2 %
Lymphs Abs: 0.2 10*3/uL — ABNORMAL LOW (ref 0.7–4.0)
MCH: 27.7 pg (ref 26.0–34.0)
MCHC: 34.4 g/dL (ref 30.0–36.0)
MCV: 80.6 fL (ref 78.0–100.0)
MONO ABS: 0.7 10*3/uL (ref 0.1–1.0)
Monocytes Relative: 6 %
Neutro Abs: 11.3 10*3/uL — ABNORMAL HIGH (ref 1.7–7.7)
Neutrophils Relative %: 92 %
PLATELETS: 257 10*3/uL (ref 150–400)
RBC: 3.46 MIL/uL — ABNORMAL LOW (ref 4.22–5.81)
RDW: 15.3 % (ref 11.5–15.5)
WBC: 12.3 10*3/uL — ABNORMAL HIGH (ref 4.0–10.5)

## 2016-11-18 LAB — PHOSPHORUS: Phosphorus: 2.5 mg/dL (ref 2.5–4.6)

## 2016-11-18 LAB — GLUCOSE, CAPILLARY
GLUCOSE-CAPILLARY: 116 mg/dL — AB (ref 65–99)
GLUCOSE-CAPILLARY: 87 mg/dL (ref 65–99)
GLUCOSE-CAPILLARY: 109 mg/dL — AB (ref 65–99)
GLUCOSE-CAPILLARY: 151 mg/dL — AB (ref 65–99)
Glucose-Capillary: 153 mg/dL — ABNORMAL HIGH (ref 65–99)

## 2016-11-18 LAB — MAGNESIUM: Magnesium: 1.7 mg/dL (ref 1.7–2.4)

## 2016-11-18 MED ORDER — SODIUM CHLORIDE 0.9 % IV SOLN: INTRAVENOUS | Status: DC

## 2016-11-18 MED ORDER — OSMOLITE 1.5 CAL PO LIQD
474.0000 mL | Freq: Three times a day (TID) | ORAL | Status: DC
  Administered 2016-11-18 – 2016-11-19 (×3): 474 mL
  Filled 2016-11-18 (×4): qty 474

## 2016-11-18 MED ORDER — FUROSEMIDE 20 MG PO TABS: 20.0000 mg | ORAL_TABLET | Freq: Every day | ORAL | Status: DC

## 2016-11-18 MED ORDER — SENNOSIDES-DOCUSATE SODIUM 8.6-50 MG PO TABS
1.0000 | ORAL_TABLET | Freq: Every day | ORAL | Status: DC
  Administered 2016-11-18: 1 via ORAL
  Filled 2016-11-18: qty 1

## 2016-11-18 NOTE — Progress Notes (Signed)
Antonio [name]   DOB:05-Nov-1955   BM#:[id number]    Subjective: He is doing well.  Pain is well controlled.  He is able to tolerate intermittent bolus feeds.  Completed cycle 1 of carbo/taxol on November 18, 270 without complications  Objective:  Vitals:   11/18/16 0527 11/18/16 1440  BP: 135/67 123/76  Pulse: 88 98  Resp: 16 16  Temp: 98.4 F (36.9 C) 98.2 F (36.8 C)  SpO2: 98% 99%     Intake/Output Summary (Last 24 hours) at 11/18/16 1449 Last data filed at 11/18/16 1440  Gross per 24 hour  Intake              814 ml  Output             4725 ml  Net            -3911 ml    GENERAL:alert, no distress and comfortable SKIN: skin color, texture, turgor are normal, no rashes or significant lesions EYES: normal, Conjunctiva are pink and non-injected, sclera clear OROPHARYNX:no exudate, no erythema and lips, buccal mucosa, and tongue normal  NECK: supple, thyroid normal size, non-tender, without nodularity LYMPH:  no palpable lymphadenopathy in the cervical, axillary or inguinal LUNGS: clear to auscultation and percussion with normal breathing effort HEART: regular rate & rhythm and no murmurs and no lower extremity edema ABDOMEN:abdomen soft, non-tender and normal bowel sounds Musculoskeletal:no cyanosis of digits and no clubbing  NEURO: alert & oriented x 3 with dysarthria, no focal motor/sensory deficits   Labs:  Lab Results  Component Value Date   WBC 12.3 (H) 11/18/2016   HGB 9.6 (L) 11/18/2016   HCT 27.9 (L) 11/18/2016   MCV 80.6 11/18/2016   PLT 257 11/18/2016   NEUTROABS 11.3 (H) 11/18/2016    Lab Results  Component Value Date   NA 138 11/18/2016   K 3.8 11/18/2016   CL 103 11/18/2016   CO2 28 11/18/2016    Assessment & Plan:   Recurrent, 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, 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 Unfortunately, the patient has developed recurrent disease within 3 months of stopping treatment At this point in time, even without a  biopsy, this is highly suspicious for recurrent metastatic squamous cell carcinoma from the head and neck region The patient had severe, locally advanced disease before chemoradiation therapy The patient is informed that his disease is no longer curable and any form of treatment is strictly palliative in nature With significant disease diffuse disease, even with chemotherapy, I think his prognosis is very poor Yesterday, we discussed about the role of palliative chemotherapy to treat hypercalcemia and to extend his life.  After much discussion, the patient agreed to proceed with palliative chemotherapy. He received cycle 1 day 1 on November 17, 2016.  We will resume outpatient chemotherapy next week  Malignant hypercalcemia, resolved He is on aggressive IV fluid resuscitation, IV Lasix and steroids On November 15, 2016, the dose of steroids was increased along with addition of nasal calcitonin.  Finally, he received 1 dose of IV Zometa on November 16, 2016.   His serum calcium has stabilized over the last 2 days I recommend we continue to monitor his calcium level daily. Hopefully, with chemotherapy, hypercalcemia will not recur I plan to discontinue Lasix, IV fluid and steroids  Moderate to severe protein calorie malnutrition He has started to resume tube feeding on November 15, 2016 at goal rate Recommend checking electrolytes carefully I recommend transitioning him back to bolus  feeding if possible before discharge  Severe cancer pain, improved  he will continue pain management with morphine sulfate I will add availability of liquid Roxanol to be given through the feeding tube due to difficulties with swallowing and IV prn morphine for breakthrough pain  Anemia chronic illness Will observe  CODE STATUS He is currently full code Recommend the patient to appoint one family member for his medical healthcare power of attorney  Discharge planning Hopefully tomorrow.  I will schedule  outpatient follow-up [name], MD 11/18/2016  2:49 PM

## 2016-11-18 NOTE — Progress Notes (Signed)
Progress Note    Antonio [name]  OMV:[id number] DOB: 1955-11-23  DOA: 11/13/2016 PCP: Maryellen Pile, MD    Brief Narrative:   Chief complaint: Follow-up dysphasia and hypercalcemia  Medical records reviewed and are as summarized below:  Antonio [name] is an 62 y.o. male with history of supraglottic cancer status post chemoradiation, follows with [name], who was admitted 11/13/16 for worsening throat pain and dysphagia. He was also noted to be hypercalcemic therefore getting treatment with IV Lasix, IV fluids and steroids. Repeat scans a CT head and neck were suggestive of widespread recurrence of malignancy.  Assessment/Plan:   Principal problem:  Throat pain and dysphagia secondary to stage IV supraglottic cancer -Oncologist following and has had discussions with the patient regarding his prognosis. Palliative care has evaluated patient and is assisting with comfort measures. -he is currently full code, ongoing discussions are being held with him by both palliative care and oncology. -Patient decided to go ahead with palliative chemo. S/p carboplatin/paclitaxel on 10/5  Active problems: Malignant hypercalcemia Ca on admission was 12.8 He received  steroids, IV fluids, IV Lasix. Nasal calcitonin for 48hrs during initial hospitalization   Zometa x1 on 10/4 Chemo with carboplatin/paclitazel on 10/5 Ca 9.3 on 10/6 D/c lasix/ivf, steroids on 10/6 per oncology recommendation,  repeat labs in am, if ca remain stable, hopefully can discharge home   Anemia of chronic disease This is in setting of ongoing chronic issues including his cancer. hgb stable around 9.6, will need to repeat cbc post chemo, he is to follow up with hematology/oncology closely  Mild leukocytosis No signs of active infection. He is on steroids. No antibiotics at this time.   Severe protein calorie malnutrition Body mass index is 22.65 kg/m. Malnutrition a combination of cachexia related to  stage IV cancer and poor oral intake due to dysphagia. He is receiving Osmolite 60 mL/hour through his PEG tube, now transitioned to bolus feeds, patient prefer osmolites 2can tid at 8am, 4pm and 10pm with free water flushes,  He can tolerate oral fluids intake Nutrition input appreciated  Case discussed with RN, case manager and oncology in person.   Family Communication/Anticipated D/C date and plan/Code Status   DVT prophylaxis: Lovenox ordered. Code Status: Full Code.  Family Communication: patient Disposition Plan: Home with home health.   Medical Consultants:    Oncology  Palliative Care   Anti-Infectives:    None  Subjective:   Feeling better, he wants the bolus feeds two cans three times a day at 8am, 4pm and 10pm,  He report he has been doing this at home since may, 2018 Report left sided back and and left leg pain has resolved  He denies throat pain on clears. Denies abdominal pain, nausea. No fever.  Objective:    Vitals:   11/17/16 1815 11/17/16 2225 11/18/16 0527 11/18/16 0827  BP: (!) 149/81 (!) 144/79 135/67   Pulse: 82 85 88   Resp: 16 16 16    Temp: 98.1 F (36.7 C) 99.3 F (37.4 C) 98.4 F (36.9 C)   TempSrc: Oral Oral Oral   SpO2: 99% 97% 98%   Weight:    58 kg (127 lb 13.9 oz)  Height:        Intake/Output Summary (Last 24 hours) at 11/18/16 1308 Last data filed at 11/18/16 1207  Gross per 24 hour  Intake              814 ml  Output  3625 ml  Net            -2811 ml   Filed Weights   11/16/16 1700 11/17/16 1300 11/18/16 0827  Weight: 56.7 kg (125 lb) 57.3 kg (126 lb 5.2 oz) 58 kg (127 lb 13.9 oz)    Exam: General: No acute distress. Mildly cachectic. Cardiovascular: Heart sounds show a regular rate, and rhythm. No gallops or rubs. No murmurs. No JVD. Lungs: Diminished breath sounds with fair air movement. No rales, rhonchi or wheezes. Abdomen: Soft, nontender, nondistended with normal active bowel sounds. No masses.  No hepatosplenomegaly. PEG tube present. Neurological: Alert and oriented 3. Moves all extremities 4 with equal strength. Cranial nerves II through XII grossly intact. Skin: Warm and dry. No rashes or lesions. Extremities: No clubbing or cyanosis. No edema. Pedal pulses 2+. Psychiatric: Mood and affect are normal. Insight and judgment are fair.   Data Reviewed:   I have personally reviewed following labs and imaging studies:  Labs: Labs show the following:   Basic Metabolic Panel:  Recent Labs Lab 11/13/16 1338  11/14/16 1638 11/15/16 0504 11/16/16 0446 11/17/16 0500 11/18/16 0745  NA 138  < > 139 139 138 137 138  K 3.8  < > 3.7 3.7 3.8 3.5 3.8  CL  --   < > 101 103 103 102 103  CO2 31*  < > 29 30 29 28 28   GLUCOSE 126  < > 110* 107* 177* 152* 87  BUN 15.7  < > 13 13 16 15 16   CREATININE 1.1  < > 0.93 0.94 0.86 0.72 0.82  CALCIUM 12.8*  < > 11.6* 11.5* 11.1* 10.3 9.3  MG 2.7*  --   --   --  1.6* 1.5* 1.7  PHOS  --   --   --   --  3.2  --  2.5  < > = values in this interval not displayed. GFR Estimated Creatinine Clearance: 77.1 mL/min (by C-G formula based on SCr of 0.82 mg/dL). Liver Function Tests:  Recent Labs Lab 11/13/16 1338 11/16/16 0446  AST 17 17  ALT 12 13*  ALKPHOS 67 55  BILITOT 0.57 0.3  PROT 7.2 6.1*  ALBUMIN 3.4* 3.0*   CBC:  Recent Labs Lab 11/13/16 1340 11/13/16 2204 11/14/16 1638 11/15/16 0504 11/17/16 0500 11/18/16 0745  WBC 7.6 15.4* 8.2 7.4 14.4* 12.3*  NEUTROABS 6.2  --   --   --  13.5* 11.3*  HGB 9.8* 13.6 9.6* 9.3* 9.4* 9.6*  HCT 30.0* 42.8 28.2* 27.4* 27.6* 27.9*  MCV 82.6 96.8 79.4 80.6 80.2 80.6  PLT 278 248 303 308 296 257   CBG:  Recent Labs Lab 11/17/16 1158 11/17/16 2220 11/18/16 0525 11/18/16 0814 11/18/16 1204  GLUCAP 117* 121* 116* 87 153*    Microbiology No results found for this or any previous visit (from the past 240 hour(s)).  Procedures and diagnostic studies:  No results  found.  Medications:   . cyclobenzaprine  10 mg Oral QHS  . dexamethasone  4 mg Intravenous Q6H  . diphenhydrAMINE  50 mg Intravenous Once  . enoxaparin (LOVENOX) injection  40 mg Subcutaneous Q24H  . feeding supplement (OSMOLITE 1.5 CAL)  474 mL Per Tube TID WC  . free water  100 mL Per Tube Q4H  . gabapentin  300 mg Oral TID   Continuous Infusions: . sodium chloride    . sodium chloride    . sodium chloride    . famotidine  LOS: 5 days   Chryl Holten MD PhD  [facility] Pager [phone]  *Please refer to [email or web address], password [email or web address] to get updated schedule on who will round on this patient, as hospitalists switch teams weekly. If 7PM-7AM, please contact night-coverage at [email or web address], password [email or web address] for any overnight needs.  11/18/2016, 1:08 PM

## 2016-11-18 NOTE — Care Management Note (Signed)
Case Management Note  Patient Details  Name: Antonio [name] MRN: [id number] Date of Birth: 11/20/1955  Subjective/Objective:  Cancer of supraglottis, hypercalcemia, anemia                  Action/Plan: Discharge Planning: NCM spoke to pt at bedside. States he was getting some samples from the cancer center for his tube feeds. States he only has two bottles left. States paying copay will be a challenge due to his fixed income. States his sister will assist with help him pay for tube feedings. Offered choice for HH/list provided. Pt agreeable to [facility] for HH. Contacted [facility] with new referral for tube feeding and HH.   PCP  Expected Discharge Date:  11/19/2016            Expected Discharge Plan:  [place]  In-House Referral:  NA  Discharge planning Services  CM Consult  Post Acute Care Choice:  Home Health Choice offered to:  Patient  DME Arranged:  Tube feeding DME Agency:  [place] Arranged:  RN, Social Work [employer] Agency:  [place]  Status of Service:  Completed, signed off  If discussed at [employer], dates discussed:    Additional Comments:  Erenest Rasher, RN 11/18/2016, 3:28 PM

## 2016-11-19 LAB — BASIC METABOLIC PANEL
ANION GAP: 8 (ref 5–15)
BUN: 21 mg/dL — ABNORMAL HIGH (ref 6–20)
CHLORIDE: 101 mmol/L (ref 101–111)
CO2: 31 mmol/L (ref 22–32)
Calcium: 9.2 mg/dL (ref 8.9–10.3)
Creatinine, Ser: 0.84 mg/dL (ref 0.61–1.24)
GFR calc non Af Amer: >60 mL/min (ref >60)
Glucose, Bld: 108 mg/dL — ABNORMAL HIGH (ref 65–99)
Potassium: 3.4 mmol/L — ABNORMAL LOW (ref 3.5–5.1)
SODIUM: 140 mmol/L (ref 135–145)

## 2016-11-19 LAB — GLUCOSE, CAPILLARY
GLUCOSE-CAPILLARY: 156 mg/dL — AB (ref 65–99)
Glucose-Capillary: 94 mg/dL (ref 65–99)
Glucose-Capillary: 94 mg/dL (ref 65–99)

## 2016-11-19 LAB — PHOSPHORUS: PHOSPHORUS: 2.1 mg/dL — AB (ref 2.5–4.6)

## 2016-11-19 LAB — MAGNESIUM: MAGNESIUM: 1.9 mg/dL (ref 1.7–2.4)

## 2016-11-19 MED ORDER — OSMOLITE 1.5 CAL PO LIQD: ORAL | 11 refills | Status: AC

## 2016-11-19 MED ORDER — POTASSIUM CHLORIDE 20 MEQ/15ML (10%) PO SOLN
40.0000 meq | Freq: Once | ORAL | Status: AC
  Administered 2016-11-19: 40 meq via ORAL
  Filled 2016-11-19: qty 30

## 2016-11-19 MED ORDER — FREE WATER: 100.0000 mL | 0 refills | Status: AC

## 2016-11-19 MED ORDER — MORPHINE SULFATE 15 MG PO TABS: 15.0000 mg | ORAL_TABLET | Freq: Four times a day (QID) | ORAL | 0 refills | Status: DC | PRN

## 2016-11-19 MED ORDER — MORPHINE SULFATE (CONCENTRATE) 10 MG/0.5ML PO SOLN: 10.0000 mg | ORAL | 0 refills | Status: DC | PRN

## 2016-11-19 NOTE — Progress Notes (Signed)
Patient discharged to home, all discharge medications and instructions reviewed and questions answered.  Patient to be assisted to vehicle by wheelchair.  

## 2016-11-19 NOTE — Discharge Summary (Signed)
Discharge Summary  Antonio [name] NGE:[id number] DOB: January 30, 1956  PCP: Maryellen Pile, MD  Admit date: 11/13/2016 Discharge date: 11/19/2016  Time spent:> 71mins  Recommendations for Outpatient Follow-up:  1. F/u with PMD within a week  for hospital discharge follow up, repeat cbc/bmp at follow up 2. F/u with oncology in one week for palliative chemotherapy 3. Patient prefers morphine tablet.  he  Reports he can crush the pills, he does not like solution form.  4. Home health arranged  Discharge Diagnoses:  Active Hospital Problems   Diagnosis Date Noted  . Dysphagia   . Failure to thrive in adult   . 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 Encounter for palliative care   . Hypercalcemia 11/13/2016    Resolved Hospital Problems   Diagnosis Date Noted Date Resolved  No resolved problems to display.    Discharge Condition: stable  Diet recommendation: heart healthy/carb modified  Filed Weights   11/16/16 1700 11/17/16 1300 11/18/16 0827  Weight: 56.7 kg (125 lb) 57.3 kg (126 lb 5.2 oz) 58 kg (127 lb 13.9 oz)    History of present illness:  PCP: Maryellen Pile, MD   Outpatient Specialists: Oncologist [name]   Patient coming from: home   Chief Complaint: weakness, fatigue   HPI: Antonio [name] is a 61 y.o. male with known supraglottic cancer, follows with [name], s/p radiation and chemo therapy, presented from cancer center for evaluation of progressively worsening throat pain and dysphagia. Patient describes pain as throbbing, 10/10 in severity, radiating down to neck, worse with swallowing, no specific alleviating factors, associated with dysphagia and weight loss, due to poor oral intake. Patient was seen at the cancer center and was found to have high Ca level and was referred for an admission for further treatment. Pt denies chest pain or dyspnea. No specific abd or urinary concerns.   ED Course: Pt is hemodynamically stable, VSS, Ca 12.8. TRH  asked to admit for further evaluation.   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 Palliative care has evaluated patient and is assisting with comfort measures. -he is currently full code, ongoing discussions are being held with him by both palliative care and oncology. -Patient decided to go ahead with palliative chemo. S/p carboplatin/paclitaxel on 10/5 -he is to follow up with oncology in one week  Active problems: Malignant hypercalcemia Ca on admission was 12.8 He received  steroids, IV fluids, IV Lasix. Nasal calcitonin for 48hrs during initial hospitalization   Zometa x1 on 10/4 Chemo with carboplatin/paclitazel on 10/5 D/c lasix/ivf, steroids on 10/6 per oncology recommendation, Ca 9.3 on 10/6, ca 9.2 on 10/7    Anemia of chronic disease This is in setting of ongoing chronic issues including his cancer. hgb stable around 9.6, will need to repeat cbc post chemo, he is to follow up with hematology/oncology closely  Mild leukocytosis No signs of active infection. He is on steroids. No antibiotics at this time.   Severe protein calorie malnutrition Body mass index is 22.65 kg/m. Malnutrition a combination of cachexia related to stage IV cancer and poor oral intake due to dysphagia. He is receiving Osmolite 60 mL/hour through his PEG tube, now transitioned to bolus feeds, patient  prefer osmolites 2can tid at 8am, 4pm and 10pm with free water flushes,  He can tolerate oral fluids intake Nutrition input appreciated  Cancer pain: he declined liquid form of morphine, he prefers morphine in pills form. He states he can crush the pills.   Family  Communication/Anticipated D/C date and plan/Code Status   DVT prophylaxis while in the hospital: Lovenox ordered. Code Status: Full Code.  Family Communication: patient Disposition Plan: Home with home health.   Medical Consultants:    Oncology  Palliative Care   Anti-Infectives:    None   Discharge Exam: BP 120/69 (BP Location: Left Arm)   Pulse 94   Temp 98.5 F (36.9 C) (Oral)   Resp 18   Ht 5\' 3"  (1.6 m)   Wt 58 kg (127 lb 13.9 oz)   SpO2 99%   BMI 22.65 kg/m   General: NAD Cardiovascular: RRR Respiratory: CTABL  Discharge Instructions You were cared for by a hospitalist during your hospital stay. If you have any questions about your discharge medications or the care you received while you were in the hospital after you are discharged, you can call the unit and asked to speak with the hospitalist on call if the hospitalist that took care of you is not available. Once you are discharged, your primary care physician will handle any further medical issues. Please note that NO REFILLS for any discharge medications will be authorized once you are discharged, as it is imperative that you return to your primary care physician (or establish a relationship with a primary care physician if you do not have one) for your aftercare needs so that they can reassess your need for medications and monitor your lab values.  Discharge Instructions    Discharge instructions    Complete by:  As directed    Clear liquid by mouth main nutrition per peg tube   Face-to-face encounter (required for Medicare/Medicaid patients)    Complete by:  As directed    I Mechel Schutter certify that this patient is under my care and that I, or a nurse practitioner or physician's assistant working with me, had a face-to-face encounter that meets the physician face-to-face encounter requirements with this patient on 11/18/2016. The encounter with the patient was in whole, or in part for the following  medical condition(s) which is the primary reason for home health care (List medical condition): FTT, Recurrent, 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, 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  Severe malnutrition,  Nutrition per peg tube   The encounter with the patient was in whole, or in part, for the following medical condition, which is the primary reason for home health care:  FTT   I certify that, based on my findings, the following services are medically necessary home health services:  Nursing   Reason for Medically Necessary Home Health Services:  Skilled Nursing- Change/Decline in Patient Status   My clinical findings support the need for the above services:  Shortness of breath with activity   Further, I certify that my clinical findings support that this patient is homebound due to:  Immunocompromised   Home Health    Complete by:  As directed    To provide the following care/treatments:   RN Social work     Increase activity slowly    Complete by:  As directed    [place]    Complete by:  As directed    Chemotherapy Appointment  - 3 hour    TREATMENT  CONDITIONS    Complete by:  As directed    Patient should have CBC & CMP within 7 days prior to chemotherapy administration. NOTIFY MD IF: ANC < 1500, Hemoglobin < 8, PLT < 100,000,  Total Bili > 1.5, Creatinine > 1.5, ALT & AST > 80 or if patient has unstable vital signs: Temperature > 38.5, SBP > 180 or < 90, RR > 30 or HR > 100.     Allergies as of 11/19/2016   No Known Allergies     Medication List    STOP taking these medications   EQ ALLERGY RELIEF 10 MG tablet Generic drug:  loratadine   morphine 15 MG tablet Commonly known as:  MSIR Replaced by:  morphine CONCENTRATE 10 MG/0.5ML Soln concentrated solution   prochlorperazine 10 MG tablet Commonly known as:  COMPAZINE     TAKE these medications   cyclobenzaprine 10 MG tablet Commonly known as:  FLEXERIL TAKE ONE TABLET BY MOUTH AT  BEDTIME What changed:  See the new instructions.   feeding supplement (OSMOLITE 1.5 CAL) Liqd Begin Osmolite 1.5 via PEG TID with 60 mL free water. Advance slowly to  goal of 2 bottles QID with 60 mL free water before and after. In addition, drink or flush PEG with 240 mL free water TID. What changed:  additional instructions   free water Soln Place 100 mLs into feeding tube every 4 (four) hours.   gabapentin 300 MG capsule Commonly known as:  NEURONTIN Take 1 capsule (300 mg total) by mouth 3 (three) times daily.   lidocaine-prilocaine cream Commonly known as:  EMLA Apply to affected area once   morphine CONCENTRATE 10 MG/0.5ML Soln concentrated solution Place 0.5 mLs (10 mg total) into feeding tube every 2 (two) hours as needed for moderate pain or shortness of breath. Replaces:  morphine 15 MG tablet   ondansetron 8 MG tablet Commonly known as:  ZOFRAN Take 1 tablet (8 mg total) by mouth 2 (two) times daily as needed. Start on the third day after chemotherapy.   senna-docusate 8.6-50 MG tablet Commonly known as:  Senokot-S Take 1 tablet by mouth 2 (two) times daily.            Durable Medical Equipment        Start     Ordered   11/18/16 1511  For home use only DME Tube feeding  Once    Comments:  Provides: 2160 kcals, 90 grams protein, 1094 ml free water. This meets 100% of calorie needs and 100% of protein needs.   bolus feedings : Osmolite 1.5, two cans (237cc per can) TID at 8am, 4pm and 10pm per patient's preference.   11/18/16 1512     No Known Allergies Follow-up Information    [facility] Follow [facility].   Why:  Home Health RN and Social Worker-agency will call to arrange initial visit Contact information: [address] [place] [address] [phone]        Gorsuch, Ni, MD Follow up in 1 week(s).   Specialty:  Hematology and Oncology Why:  for palliative chemotherapy  Contact information: [place] [id number] [phone]        Maryellen Pile, MD Follow up in 2 week(s).   Specialty:  Internal Medicine Why:  hospital discharge follow up. Contact information: [place] [id number] [phone]            The results of significant diagnostics from this  hospitalization (including imaging, microbiology, ancillary and laboratory) are listed below for reference.    Significant Diagnostic Studies: Ct Head W Or Wo Contrast  Addendum Date: 11/15/2016   ADDENDUM REPORT: 11/15/2016 15:09 ADDENDUM: I discussed the case with Dr. Logan Bores, who had reviewed the case with the clinical team at tumor board on 11/15/2016. On further review, the confluent nodal mass at left level 2 has decreased in size compared to the PET CT of 06/08/2016. Precise measurement is difficult due to the loss of fat planes between the mass and the adjacent muscle. At right level 3, there is a low-density lymph node measuring 12 mm, which is new from the prior study. There is a new low-density lymph node at right level 4, measuring 12 mm. Electronically Signed   By: Ulyses Jarred M.D.   On: 11/15/2016 15:09   Result Date: 11/15/2016 CLINICAL DATA:  Worsening throat pain and dysphagia. Supraglottic laryngeal cancer. Status post radiation and chemotherapy. EXAM: CT HEAD WITHOUT AND WITH CONTRAST CT NECK WITH CONTRAST TECHNIQUE: Contiguous axial images were obtained from the base of the skull through the vertex without and with intravenous contrast. Multidetector CT imaging of the and neck was performed using the standard protocol following the bolus administration of intravenous contrast. CONTRAST:  168mL ISOVUE-300 IOPAMIDOL (ISOVUE-300) INJECTION 61% COMPARISON:  PET CT 06/08/2016 FINDINGS: CT HEAD FINDINGS Brain: No mass lesion, intraparenchymal hemorrhage or extra-axial collection. No evidence of acute cortical infarct. Brain parenchyma and CSF-containing spaces are normal for  age. No contrast-enhancing lesions. Vascular: No hyperdense vessel or unexpected calcification. Skull: Normal visualized skull base, calvarium and extracranial soft tissues. Sinuses/Orbits: No sinus fluid levels or advanced mucosal thickening. No mastoid effusion. Normal orbits. CT NECK FINDINGS Pharynx and larynx: --Nasopharynx: Fossae of Rosenmuller are clear. Normal adenoid tonsils for age. --Oral cavity and oropharynx: The palatine and lingual tonsils are normal. The visible oral cavity and floor of mouth are normal. --Hypopharynx: There is hypertrophy of the lingual tonsils. --Larynx: The epiglottis is thickened, compatible with history of radiation treatment. There is circumferential soft tissue thickening within the larynx. This is greatest at the posterior left aspect. --Retropharyngeal space: No abscess, effusion or lymphadenopathy. Salivary glands: --Parotid: No mass lesion or inflammation. No sialolithiasis or ductal dilatation. --Submandibular: Symmetric without inflammation. No sialolithiasis or ductal dilatation. --Sublingual: Normal. No ranula or other visible lesion of the base of tongue and floor of mouth. Thyroid: Normal. Lymph nodes: There is confluent lymphadenopathy at left level 2, encasing the proximal internal and external carotid arteries. This appears unchanged compared to the prior PET CT. Right level 5A node measures 9 mm. Vascular: There is aortic atherosclerotic calcification. There is mixed calcified and noncalcified plaque within both common carotid arteries and at the carotid bifurcations. The there is an accessed right chest wall Port-A-Cath with its tip coursing beyond the field of view. Skeleton: No bony spinal canal stenosis. No lytic or blastic lesions. Upper chest: Please see dedicated report for chest CT. Other: None. IMPRESSION: 1. No acute intracranial abnormality. No contrast-enhancing brain lesions. 2. Unchanged appearance of circumferential supraglottic laryngeal soft  tissue thickening and confluent, masslike adenopathy at left level IIa compared to PET CT of 06/08/2016. 3. Thickening of the epiglottis is compatible with radiation treatment. 4. Please see report for dedicated chest CT for discussion of findings below the thoracic inlet. Electronically Signed: By: Ulyses Jarred M.D. On: 11/13/2016 18:50   Ct Soft Tissue Neck W Contrast  Addendum Date: 11/15/2016   ADDENDUM REPORT: 11/15/2016  15:09 ADDENDUM: I discussed the case with Dr. Logan Bores, who had reviewed the case with the clinical team at tumor board on 11/15/2016. On further review, the confluent nodal mass at left level 2 has decreased in size compared to the PET CT of 06/08/2016. Precise measurement is difficult due to the loss of fat planes between the mass and the adjacent muscle. At right level 3, there is a low-density lymph node measuring 12 mm, which is new from the prior study. There is a new low-density lymph node at right level 4, measuring 12 mm. Electronically Signed   By: Ulyses Jarred M.D.   On: 11/15/2016 15:09   Result Date: 11/15/2016 CLINICAL DATA:  Worsening throat pain and dysphagia. Supraglottic laryngeal cancer. Status post radiation and chemotherapy. EXAM: CT HEAD WITHOUT AND WITH CONTRAST CT NECK WITH CONTRAST TECHNIQUE: Contiguous axial images were obtained from the base of the skull through the vertex without and with intravenous contrast. Multidetector CT imaging of the and neck was performed using the standard protocol following the bolus administration of intravenous contrast. CONTRAST:  113mL ISOVUE-300 IOPAMIDOL (ISOVUE-300) INJECTION 61% COMPARISON:  PET CT 06/08/2016 FINDINGS: CT HEAD FINDINGS Brain: No mass lesion, intraparenchymal hemorrhage or extra-axial collection. No evidence of acute cortical infarct. Brain parenchyma and CSF-containing spaces are normal for age. No contrast-enhancing lesions. Vascular: No hyperdense vessel or unexpected calcification. Skull: Normal  visualized skull base, calvarium and extracranial soft tissues. Sinuses/Orbits: No sinus fluid levels or advanced mucosal thickening. No mastoid effusion. Normal orbits. CT NECK FINDINGS Pharynx and larynx: --Nasopharynx: Fossae of Rosenmuller are clear. Normal adenoid tonsils for age. --Oral cavity and oropharynx: The palatine and lingual tonsils are normal. The visible oral cavity and floor of mouth are normal. --Hypopharynx: There is hypertrophy of the lingual tonsils. --Larynx: The epiglottis is thickened, compatible with history of radiation treatment. There is circumferential soft tissue thickening within the larynx. This is greatest at the posterior left aspect. --Retropharyngeal space: No abscess, effusion or lymphadenopathy. Salivary glands: --Parotid: No mass lesion or inflammation. No sialolithiasis or ductal dilatation. --Submandibular: Symmetric without inflammation. No sialolithiasis or ductal dilatation. --Sublingual: Normal. No ranula or other visible lesion of the base of tongue and floor of mouth. Thyroid: Normal. Lymph nodes: There is confluent lymphadenopathy at left level 2, encasing the proximal internal and external carotid arteries. This appears unchanged compared to the prior PET CT. Right level 5A node measures 9 mm. Vascular: There is aortic atherosclerotic calcification. There is mixed calcified and noncalcified plaque within both common carotid arteries and at the carotid bifurcations. The there is an accessed right chest wall Port-A-Cath with its tip coursing beyond the field of view. Skeleton: No bony spinal canal stenosis. No lytic or blastic lesions. Upper chest: Please see dedicated report for chest CT. Other: None. IMPRESSION: 1. No acute intracranial abnormality. No contrast-enhancing brain lesions. 2. Unchanged appearance of circumferential supraglottic laryngeal soft tissue thickening and confluent, masslike adenopathy at left level IIa compared to PET CT of 06/08/2016. 3.  Thickening of the epiglottis is compatible with radiation treatment. 4. Please see report for dedicated chest CT for discussion of findings below the thoracic inlet. Electronically Signed: By: Ulyses Jarred M.D. On: 11/13/2016 18:50   Ct Chest W Contrast  Result Date: 11/13/2016 CLINICAL DATA:  History of head and neck cancer with throat pain and dysphagia EXAM: CT CHEST WITH CONTRAST TECHNIQUE: Multidetector CT imaging of the chest was performed during intravenous contrast administration. CONTRAST:  18mL ISOVUE-300 IOPAMIDOL (ISOVUE-300) INJECTION 61%  COMPARISON:  PET-CT 06/08/2016 FINDINGS: Cardiovascular: Nonaneurysmal aorta. Atherosclerotic vascular calcifications. Coronary artery calcification. Central venous catheter tip terminates at the cavoatrial junction. Normal heart size. No significant pericardial effusion. Mediastinum/Nodes: Since the previous PET-CT, development of multiple enlarged mediastinal lymph nodes. Right paratracheal lymph node measures 32 x 22 cm. AP window lymph node measures 16 mm. subcarinal lymph node measures 23 mm. A lymph node anterior to the pericardium measures 6 mm. Development of bilateral hilar adenopathy, measuring 15 mm on the right and 17 mm on the left. Midline trachea.  No thyroid mass.  Esophagus within normal limits. Lungs/Pleura: Development of a small right-sided pleural effusion. Suspect increased size of a right lower lobe lung mass but increased consolidation in the right lower lobe makes assessment difficult. Mass measures approximately 2.6 cm, compared with 1.9 cm previously. Development of multiple bilateral ill-defined pulmonary nodules within the upper lobes and bilateral lower lobes. Patchy consolidation within the right lower lobe. Upper Abdomen: Subcentimeter hypodense liver foci too small to further characterize. Subcentimeter low-density lesion mid to upper left kidney. Musculoskeletal: No acute or suspicious bone lesions IMPRESSION: 1. Interim  development of multiple enlarged mediastinal and hilar lymph nodes consistent with metastatic disease. 2. Development of multiple bilateral pulmonary nodules, also suspicious for metastatic disease. Suspect that the previously noted right lower lobe lung mass has increased in size, now measuring about 26 mm compared with 19 mm previously. 3. New small right-sided pleural effusion. Patchy consolidations in the right lower lobe, uncertain if this represents pneumonia, aspiration, or metastatic nodules. 4. Subcentimeter hypodense foci in the liver, too small to further characterize. When the patient is clinically stable and able to follow directions and hold their breath (preferably as an outpatient) further evaluation with dedicated abdominal MRI may be considered. Aortic Atherosclerosis (ICD10-I70.0). Electronically Signed   By: Donavan Foil M.D.   On: 11/13/2016 18:59    Microbiology: No results found for this or any previous visit (from the past 240 hour(s)).   Labs: Basic Metabolic Panel:  Recent Labs Lab 11/13/16 1338  11/15/16 0504 11/16/16 0446 11/17/16 0500 11/18/16 0745 11/19/16 0500  NA 138  < > 139 138 137 138 140  K 3.8  < > 3.7 3.8 3.5 3.8 3.4*  CL  --   < > 103 103 102 103 101  CO2 31*  < > 30 29 28 28 31   GLUCOSE 126  < > 107* 177* 152* 87 108*  BUN 15.7  < > 13 16 15 16  21*  CREATININE 1.1  < > 0.94 0.86 0.72 0.82 0.84  CALCIUM 12.8*  < > 11.5* 11.1* 10.3 9.3 9.2  MG 2.7*  --   --  1.6* 1.5* 1.7 1.9  PHOS  --   --   --  3.2  --  2.5 2.1*  < > = values in this interval not displayed. Liver Function Tests:  Recent Labs Lab 11/13/16 1338 11/16/16 0446  AST 17 17  ALT 12 13*  ALKPHOS 67 55  BILITOT 0.57 0.3  PROT 7.2 6.1*  ALBUMIN 3.4* 3.0*   No results for input(s): LIPASE, AMYLASE in the last 168 hours. No results for input(s): AMMONIA in the last 168 hours. CBC:  Recent Labs Lab 11/13/16 1340 11/13/16 2204 11/14/16 1638 11/15/16 0504 11/17/16 0500  11/18/16 0745  WBC 7.6 15.4* 8.2 7.4 14.4* 12.3*  NEUTROABS 6.2  --   --   --  13.5* 11.3*  HGB 9.8* 13.6 9.6* 9.3* 9.4* 9.6*  HCT  30.0* 42.8 28.2* 27.4* 27.6* 27.9*  MCV 82.6 96.8 79.4 80.6 80.2 80.6  PLT 278 248 303 308 296 257   Cardiac Enzymes: No results for input(s): CKTOTAL, CKMB, CKMBINDEX, TROPONINI in the last 168 hours. BNP: BNP (last 3 results) No results for input(s): BNP in the last 8760 hours.  ProBNP (last 3 results) No results for input(s): PROBNP in the last 8760 hours.  CBG:  Recent Labs Lab 11/18/16 1545 11/18/16 2101 11/19/16 0158 11/19/16 0605 11/19/16 0753  GLUCAP 109* 151* 156* 94 94       Signed:  Rushi Chasen MD, PhD  [facility] 11/19/2016, 9:08 AM

## 2016-11-19 NOTE — Progress Notes (Signed)
Antonio [name]   DOB:1955/12/31   MV#:[id number]    Subjective: He is doing well.  He denies pain.  He completed cycle 1 of treatment last week without complications  Objective:  Vitals:   11/18/16 2222 11/19/16 0617  BP: 131/82 120/69  Pulse: 88 94  Resp: 18 18  Temp: 99.1 F (37.3 C) 98.5 F (36.9 C)  SpO2: 99% 99%     Intake/Output Summary (Last 24 hours) at 11/19/16 1115 Last data filed at 11/19/16 6283  Gross per 24 hour  Intake             2873 ml  Output             2725 ml  Net              148 ml    GENERAL:alert, no distress and comfortable SKIN: skin color, texture, turgor are normal, no rashes or significant lesions EYES: normal, Conjunctiva are pink and non-injected, sclera clear Musculoskeletal:no cyanosis of digits and no clubbing  NEURO: alert & oriented x 3 with fluent speech, no focal motor/sensory deficits   Labs:  Lab Results  Component Value Date   WBC 12.3 (H) 11/18/2016   HGB 9.6 (L) 11/18/2016   HCT 27.9 (L) 11/18/2016   MCV 80.6 11/18/2016   PLT 257 11/18/2016   NEUTROABS 11.3 (H) 11/18/2016    Lab Results  Component Value Date   NA 140 11/19/2016   K 3.4 (L) 11/19/2016   CL 101 11/19/2016   CO2 31 11/19/2016     Assessment & Plan:  Recurrent, 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, 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 Unfortunately, the patient has developed recurrent disease within 3 months of stopping treatment At this point in time, even without a biopsy, this is highly suspicious for recurrent metastatic squamous cell carcinoma from the head and neck region The patient had severe, locally advanced disease before chemoradiation therapy The patient is informed that his disease is no longer curable and any form of treatment is strictly palliative in nature With significant disease diffuse disease, even with chemotherapy, I think his prognosis is very poor Yesterday, we discussed about the role of palliative chemotherapy to  treat hypercalcemia and to extend his life.  After much discussion, the patient agreed to proceed with palliative chemotherapy. He received cycle 1 day 1 on November 17, 2016.  We will resume outpatient chemotherapy next week  Malignant hypercalcemia, resolved He is on aggressive IV fluid resuscitation, IV Lasix and steroids On November 15, 2016, the dose of steroids was increased along with addition of nasal calcitonin. Finally, he received 1 dose of IV Zometa on November 16, 2016.  His serum calcium has stabilized over the last 2 days Hopefully, with chemotherapy, hypercalcemia will not recur I plan to discontinue Lasix, IV fluid and steroids  Moderate to severe protein calorie malnutrition He has started to resume tube feeding on November 15, 2016 at goal rate Recommend checking electrolytes carefully I recommend transitioning him back to bolus feeding if possible before discharge  Severe cancer pain, improved  he will continue pain management with morphine sulfate Anemia chronic illness Will observe  CODE STATUS He is currently full code Recommend the patient toappoint one family member for his medical healthcare power of attorney. He appointing Antonio [name] as MPOA  Discharge planning Home today.  I will schedule outpatient follow-up  [name], MD 11/19/2016  11:15 AM

## 2016-11-20 ENCOUNTER — Other Ambulatory Visit: Payer: Self-pay

## 2016-11-20 DIAGNOSIS — M170 Bilateral primary osteoarthritis of knee: Secondary | ICD-10-CM | POA: Diagnosis not present

## 2016-11-20 DIAGNOSIS — C7900 Secondary malignant neoplasm of unspecified kidney and renal pelvis: Secondary | ICD-10-CM | POA: Diagnosis not present

## 2016-11-20 DIAGNOSIS — C760 Malignant neoplasm of head, face and neck: Secondary | ICD-10-CM | POA: Diagnosis not present

## 2016-11-20 DIAGNOSIS — J449 Chronic obstructive pulmonary disease, unspecified: Secondary | ICD-10-CM | POA: Diagnosis not present

## 2016-11-20 DIAGNOSIS — Z431 Encounter for attention to gastrostomy: Secondary | ICD-10-CM | POA: Diagnosis not present

## 2016-11-20 DIAGNOSIS — Z87891 Personal history of nicotine dependence: Secondary | ICD-10-CM | POA: Diagnosis not present

## 2016-11-20 DIAGNOSIS — C321 Malignant neoplasm of supraglottis: Secondary | ICD-10-CM | POA: Diagnosis not present

## 2016-11-20 DIAGNOSIS — Z79899 Other long term (current) drug therapy: Secondary | ICD-10-CM | POA: Diagnosis not present

## 2016-11-20 DIAGNOSIS — G893 Neoplasm related pain (acute) (chronic): Secondary | ICD-10-CM | POA: Diagnosis not present

## 2016-11-20 DIAGNOSIS — R1310 Dysphagia, unspecified: Secondary | ICD-10-CM | POA: Diagnosis not present

## 2016-11-20 DIAGNOSIS — E43 Unspecified severe protein-calorie malnutrition: Secondary | ICD-10-CM | POA: Diagnosis not present

## 2016-11-20 DIAGNOSIS — I69391 Dysphagia following cerebral infarction: Secondary | ICD-10-CM | POA: Diagnosis not present

## 2016-11-20 DIAGNOSIS — C781 Secondary malignant neoplasm of mediastinum: Secondary | ICD-10-CM | POA: Diagnosis not present

## 2016-11-20 DIAGNOSIS — D630 Anemia in neoplastic disease: Secondary | ICD-10-CM | POA: Diagnosis not present

## 2016-11-20 DIAGNOSIS — C329 Malignant neoplasm of larynx, unspecified: Secondary | ICD-10-CM | POA: Diagnosis not present

## 2016-11-20 MED FILL — MORPHINE SULFATE IR 15 MG T: 15 | 2 days supply | Qty: 10 | Fill #0

## 2016-11-20 NOTE — Patient Outreach (Signed)
[facility]) Care Management  11/20/2016  Antonio [name] [birth year] [id number]  Patient with recent admission 10/1-10/08/2016. He had presented to ED from cancer center for evaluation of progressively worsening throat pain and dysphagia (describing pain as 10/10 throbbing with radiation down to neck). Was also found to have high Ca level and referred for admission for further treatment. Ca was 12.8. Patient was found to have recurrent metastatic squamous cell carcinoma to mediastinum, lung and possibly the liver. He has been informed that cancer is no longer curable and any form of treatment is strictly palliative in nature. Prognosis is poor even with chemotherapy. He agreed to palliative chemotherapy to treat hypercalcemia and to extend life. He received cycle 1 ay 1 on 11/17/2016. Resumed tube feedings. Full code at discharge. Patient was taken off morphine tablets and placed on morphine suspension. He is to drink clear liquid by mouth and main nutrition via PEG tube. He was discharged with [facility] for RN and SW. He is to follow up with [name] in 1 week for palliative chemo and with PCP in 2 weeks.  Attempted to reach patient without success. RNCM was unable to leave a voicemail because patient's voicemail box was full.  RNCM will continue to make outreaches to patient per [facility] workflow to initiate TOC.  [place] R. Emmanual Gauthreaux, RN, BSN, [place] Management Coordinator [phone]

## 2016-11-21 ENCOUNTER — Telehealth: Payer: Self-pay | Admitting: Hematology and Oncology

## 2016-11-21 ENCOUNTER — Other Ambulatory Visit: Payer: Self-pay

## 2016-11-21 ENCOUNTER — Telehealth: Payer: Self-pay | Admitting: *Deleted

## 2016-11-21 ENCOUNTER — Ambulatory Visit: Payer: Self-pay

## 2016-11-21 NOTE — Telephone Encounter (Signed)
Mailed out letter regarding October appointments

## 2016-11-21 NOTE — Patient Outreach (Signed)
[facility]) Care Management  11/21/16  Antonio [name] [birth year] [id number]  Second attempt to reach patient for transition of care outreach without success. Phone went straight to voicemail, however RNCM unable to leave a message due to mailbox being full.  RNCM to attempt another outreach tomorrow.  [place] R. Darnesha Diloreto, RN, BSN, [place] Management Coordinator [phone]

## 2016-11-21 NOTE — Telephone Encounter (Signed)
CALLED PATIENT TO INFORM THAT HE COULD COME IN FOR FU NEXT WEEK , IF HE WANTS TO , BUT HE DOESN'T HAVE TOO., NO ANSWER, MAIL BOX FULL

## 2016-11-21 NOTE — Telephone Encounter (Signed)
"  Did my brother come today to receive chemotherapy?  I'm trying to find him and he's not answering."  Not scheduled for [facility] today.

## 2016-11-22 ENCOUNTER — Telehealth: Payer: Self-pay | Admitting: *Deleted

## 2016-11-22 ENCOUNTER — Other Ambulatory Visit: Payer: Self-pay

## 2016-11-22 DIAGNOSIS — J449 Chronic obstructive pulmonary disease, unspecified: Secondary | ICD-10-CM | POA: Diagnosis not present

## 2016-11-22 DIAGNOSIS — Z87891 Personal history of nicotine dependence: Secondary | ICD-10-CM | POA: Diagnosis not present

## 2016-11-22 DIAGNOSIS — C321 Malignant neoplasm of supraglottis: Secondary | ICD-10-CM | POA: Diagnosis not present

## 2016-11-22 DIAGNOSIS — Z79899 Other long term (current) drug therapy: Secondary | ICD-10-CM | POA: Diagnosis not present

## 2016-11-22 DIAGNOSIS — E43 Unspecified severe protein-calorie malnutrition: Secondary | ICD-10-CM | POA: Diagnosis not present

## 2016-11-22 DIAGNOSIS — C781 Secondary malignant neoplasm of mediastinum: Secondary | ICD-10-CM | POA: Diagnosis not present

## 2016-11-22 DIAGNOSIS — R1310 Dysphagia, unspecified: Secondary | ICD-10-CM | POA: Diagnosis not present

## 2016-11-22 DIAGNOSIS — M170 Bilateral primary osteoarthritis of knee: Secondary | ICD-10-CM | POA: Diagnosis not present

## 2016-11-22 DIAGNOSIS — D630 Anemia in neoplastic disease: Secondary | ICD-10-CM | POA: Diagnosis not present

## 2016-11-22 DIAGNOSIS — G893 Neoplasm related pain (acute) (chronic): Secondary | ICD-10-CM | POA: Diagnosis not present

## 2016-11-22 DIAGNOSIS — Z431 Encounter for attention to gastrostomy: Secondary | ICD-10-CM | POA: Diagnosis not present

## 2016-11-22 NOTE — Telephone Encounter (Signed)
"  This is [facility].  Does this patient have an appointment tomorrow?"  No appointments at [facility] tomorrow.  Radiation on 11-28-2076, Med Onc on 11-29-2016.  No further questions.

## 2016-11-22 NOTE — Progress Notes (Signed)
[facility]  Message via in basket from [place] wit hDr. [name] for pt to have weekly nutritional assessments to ensure pt is receiving TF as ordered and is maintaining adequate intake. Relayed to [facility] team and they will follow up with the patient's primary nurse.  If patient discharges after hours, please call [phone].   Larry Sierras 11/22/2016, 5:49 PM

## 2016-11-22 NOTE — Progress Notes (Signed)
Antonio [name] presents for follow up of radiation completed 08/17/16 to his Larynx and Right Lung.   Pain issues, if any: He reports chronic back pain Using a feeding tube?: Yes, He is using 5-6 cans of osmolite daily. He is instilling water via his PEG also.  Weight changes, if any:  Wt Readings from Last 3 Encounters:  11/28/16 120 lb 12.8 oz (54.8 kg)  11/18/16 127 lb 13.9 oz (58 kg)  11/13/16 128 lb 14.4 oz (58.5 kg)   Swallowing issues, if any: He is only able to swallow liquids. He has tried applesauce with some success. Smoking or chewing tobacco? No Using fluoride trays daily? No Last ENT visit was on: He tells me that he will see Antonio [name] in 2-3 months.  Other notable issues, if any:  He presented for a routine follow up to Antonio [name] on 11/13/16. He was found to have hypercalcemia, new throat pain and mass. He was admitted and had CT Chest which demonstrated development of multiple enlarged mediastinal and hilar lymph nodes consistent with metastatic disease and development of multiple bilateral pulmonary nodules, also suspicious for metastatic disease. His PET scan has been cancelled. He has begun palliative chemotherapy given by Antonio [name].   He is scheduled to see Antonio [name] RD today.  He is scheduled to have labs drawn and an appointment with Antonio [name] with chemotherapy infusion tomorrow.   BP 106/68   Pulse (!) 127   Temp (!) 100.7 F (38.2 C)   Ht 5\' 3"  (1.6 m)   Wt 120 lb 12.8 oz (54.8 kg)   SpO2 94% Comment: room air  BMI 21.40 kg/m

## 2016-11-22 NOTE — Patient Outreach (Incomplete)
[facility]) Care Management  11/22/16  Antonio [name] Jul 13, 1955 [id number]  Successful outreach completed with patient. Patient identification.  Patient had difficulty understanding nurse on the phone. Home visit scheduled for tomorrow to complete assessments and transition of care.  Marland Kitchen

## 2016-11-23 ENCOUNTER — Other Ambulatory Visit: Payer: Self-pay | Admitting: Hematology and Oncology

## 2016-11-23 ENCOUNTER — Other Ambulatory Visit: Payer: Self-pay

## 2016-11-23 ENCOUNTER — Telehealth: Payer: Self-pay

## 2016-11-23 MED ORDER — MORPHINE SULFATE 15 MG PO TABS: 15.0000 mg | ORAL_TABLET | ORAL | 0 refills | Status: AC | PRN

## 2016-11-23 NOTE — Telephone Encounter (Signed)
Jordan Hawks, nurse with [facility] called on behalf of patient. Patient was recently in the hospital and Morphine got changed to liquid Morphine. The liquid Morphine is stopping up the feeding tube. Patietn wants the Morphine changed back to the tablets. Please call patient when Rx ready.

## 2016-11-23 NOTE — Patient Outreach (Unsigned)
[facility]) Care Management  [place]  11/23/2016   Antonio [name] 11-Oct-1955 [id number]   Home visit completed with patient.  Subjective: "I need to rest but I can't because all you nurses keep calling and wanting to come by."  Objective:   Encounter Medications:  Outpatient Encounter Prescriptions as of 11/23/2016  Medication Sig  . cyclobenzaprine (FLEXERIL) 10 MG tablet TAKE ONE TABLET BY MOUTH AT BEDTIME (Patient taking differently: TAKE ONE TABLET BY MOUTH AT BEDTIME PRN FOR SPASMS)  . gabapentin (NEURONTIN) 300 MG capsule Take 1 capsule (300 mg total) by mouth 3 (three) times daily.  Marland Kitchen lidocaine-prilocaine (EMLA) cream Apply to affected area once (Patient not taking: Reported on 10/18/2016)  . morphine (MSIR) 15 MG tablet Take 1 tablet (15 mg total) by mouth every 6 (six) hours as needed for severe pain.  . Nutritional Supplements (FEEDING SUPPLEMENT, OSMOLITE 1.5 CAL,) LIQD Begin Osmolite 1.5 via PEG TID with 60 mL free water. Advance slowly to  goal of 2 bottles QID with 60 mL free water before and after. In addition, drink or flush PEG with 240 mL free water TID.  Marland Kitchen ondansetron (ZOFRAN) 8 MG tablet Take 1 tablet (8 mg total) by mouth 2 (two) times daily as needed. Start on the third day after chemotherapy. (Patient not taking: Reported on 10/18/2016)  . senna-docusate (SENOKOT-S) 8.6-50 MG tablet Take 1 tablet by mouth 2 (two) times daily. (Patient not taking: Reported on 11/13/2016)  . Water For Irrigation, Sterile (FREE WATER) SOLN Place 100 mLs into feeding tube every 4 (four) hours.   No facility-administered encounter medications on file as of 11/23/2016.     Functional Status:  In your present state of health, do you have any difficulty performing the following activities: 11/13/2016 10/18/2016  Hearing? N N  Vision? N N  Difficulty concentrating or making decisions? N N  Walking or climbing stairs? Y Y  Comment - avoids stairs  Dressing or  bathing? N N  Doing errands, shopping? N Y  Conservation officer, nature and eating ? - N  Using the Toilet? - N  In the past six months, have you accidently leaked urine? - N  Do you have problems with loss of bowel control? - N  Managing your Medications? - Y  Managing your Finances? - N  Housekeeping or managing your Housekeeping? - N  Some recent data might be hidden    Fall/Depression Screening: Fall Risk  10/18/2016 10/05/2016 09/22/2016  Falls in the past year? No No No  Risk for fall due to : - - -  Risk for fall due to: Comment - - -   PHQ 2/9 Scores 10/18/2016 10/05/2016 09/22/2016 06/14/2016 06/02/2016 05/17/2016 03/21/2016  PHQ - 2 Score 0 1 0 0 0 1 2  PHQ- 9 Score - - - - - - 4    Assessment:  Eating jello, pudding, applesauce, popsicles, can tolerate liquids/full liquids ok. Also on osmolite 1.5 cal - 4 - 6 bottles per day via feeding tube Unable to tolerate morphine solution- was only given 10 tablets at discharged and only has 2 tablets back RNCM called cancer center, left message at nurse center for patient regarding need for new prescription Patient complains of back pain when getting up Has appointment for palliative chemotherapy next week Still has pain with swallowing, mostly dry, talking a lot bothers him Provided education about palliative care and how they may be able to assist him and patient stated he wants to  keep things the way they are and declines palliative care referral at present. He stated "I ain't giving up" Patient completed a tube feeding while RNCM was present. Dressing placed at feeding tube site Patient stated that the price of his medications increased at [facility]. He stated that he has been getting some medicines filled at [facility] and their prices are better. Stated he needs to transfer his other medications over there. Patient has a port in right upper chest.  Patient has follow up appointment on 10/16 and 10/17, labs to be drawn on  10/17 and also palliative chemotherapy  Plan:   Antonio R. [name], RN, BSN, [place] Management Coordinator [phone]

## 2016-11-23 NOTE — Telephone Encounter (Signed)
Called and told Rx ready for pick-up. 

## 2016-11-24 MED FILL — MORPHINE SULFATE IR 15 MG T: 15 | 10 days supply | Qty: 60 | Fill #0

## 2016-11-27 ENCOUNTER — Telehealth: Payer: Self-pay | Admitting: Hematology and Oncology

## 2016-11-27 NOTE — Telephone Encounter (Signed)
Tried calling patient regarding update to october

## 2016-11-28 ENCOUNTER — Other Ambulatory Visit: Payer: Self-pay | Admitting: Hematology and Oncology

## 2016-11-28 ENCOUNTER — Encounter: Payer: Self-pay | Admitting: Radiation Oncology

## 2016-11-28 ENCOUNTER — Ambulatory Visit: Payer: Medicare Other | Admitting: Nutrition

## 2016-11-28 ENCOUNTER — Ambulatory Visit
Admission: RE | Admit: 2016-11-28 | Discharge: 2016-11-28 | Disposition: A | Discharge status: Home / Self Care | Payer: Medicare Other | Source: Ambulatory Visit | Attending: Radiation Oncology | Admitting: Radiation Oncology

## 2016-11-28 VITALS — BP 106/68 | HR 127 | Temp 100.7°F | Ht 63.0 in | Wt 120.8 lb

## 2016-11-28 DIAGNOSIS — I10 Essential (primary) hypertension: Secondary | ICD-10-CM | POA: Diagnosis not present

## 2016-11-28 DIAGNOSIS — E43 Unspecified severe protein-calorie malnutrition: Secondary | ICD-10-CM | POA: Insufficient documentation

## 2016-11-28 DIAGNOSIS — Z08 Encounter for follow-up examination after completed treatment for malignant neoplasm: Secondary | ICD-10-CM | POA: Diagnosis not present

## 2016-11-28 DIAGNOSIS — F17210 Nicotine dependence, cigarettes, uncomplicated: Secondary | ICD-10-CM | POA: Diagnosis not present

## 2016-11-28 DIAGNOSIS — C321 Malignant neoplasm of supraglottis: Secondary | ICD-10-CM

## 2016-11-28 DIAGNOSIS — E7800 Pure hypercholesterolemia, unspecified: Secondary | ICD-10-CM | POA: Diagnosis not present

## 2016-11-28 DIAGNOSIS — Z510 Encounter for antineoplastic radiation therapy: Secondary | ICD-10-CM | POA: Diagnosis not present

## 2016-11-28 DIAGNOSIS — K5900 Constipation, unspecified: Secondary | ICD-10-CM | POA: Diagnosis not present

## 2016-11-28 DIAGNOSIS — R1310 Dysphagia, unspecified: Secondary | ICD-10-CM | POA: Diagnosis not present

## 2016-11-28 DIAGNOSIS — C3431 Malignant neoplasm of lower lobe, right bronchus or lung: Secondary | ICD-10-CM

## 2016-11-28 DIAGNOSIS — D573 Sickle-cell trait: Secondary | ICD-10-CM | POA: Diagnosis not present

## 2016-11-28 DIAGNOSIS — E440 Moderate protein-calorie malnutrition: Secondary | ICD-10-CM

## 2016-11-28 DIAGNOSIS — K219 Gastro-esophageal reflux disease without esophagitis: Secondary | ICD-10-CM | POA: Diagnosis not present

## 2016-11-28 NOTE — Progress Notes (Signed)
Nutrition follow-up completed with patient status post treatment for laryngeal cancer.  The patient completed treatment on July 5. On 11/13/16 he was admitted at the request of Dr [name] for dysphagia, weight loss, malignant hypercalcemia, and palpable neck mass. He began palliative chemotherapy consisting of Carboplatin/Taxol on 11/17/16, ordered by Dr [name]. Patient complains of constipation and dry mouth. Weight is down significantly at 120.8 pounds on October 16, decreased from 127 pounds October 6. Patient states he has resumed Osmolite 1.5 via feeding tube, 2 bottles, 3 times a day.  He is consuming a liquid diet.  Patient reports he has 4 full cases of Osmolite 1.5 at home.  He is interested in drinking Ensure Plus.  Estimated nutrition needs: 1910-2182 calories, 80-90 grams protein, 2.2 L fluid.  Labs include phosphorus 2.1, magnesium 1.9, potassium 3.4, glucose 108.  Nutrition diagnosis: Unintended weight loss continues.  Per inpatient RD: Malnutrition (Severe) related to chronic illness, cancer and cancer related treatments as evidenced by percent weight loss, moderate depletion of body fat, severe depletion of muscle mass.  Intervention: I educated patient to continue Osmolite 1.5, 2 bottles, 3 times a day with 60 mL free water before and after bolus feedings. Reviewed full liquid diet.  Encouraged patient to consume foods at mealtimes as tolerated. Patient should consume at least 3, 8 ounce glasses of water daily. Patient's sister agreed to contact [facility] to [facility] tube feeding as needed.  I provided her with telephone number and extension. Encouraged him to drink Ensure Plus as desired.  Provided one complementary case. Educated patient about constipation and dry mouth and provided samples of Biotene. Questions were answered.  Teach back method used.  6 bottles Osmolite 1.5 provides 2130 cal, 89.4 g protein, 2286 mL free water.  Monitoring, evaluation,  goals: Tolerate tube feeding plus oral intake to promote weight gain.  Next visit: Wednesday, October 24, during infusion.  **Disclaimer: This note was dictated with voice recognition software. Similar sounding words can inadvertently be transcribed and this note may contain transcription errors which may not have been corrected upon publication of note.**

## 2016-11-28 NOTE — Progress Notes (Signed)
Radiation Oncology         [phone] ________________________________  Name: Antonio [name] MRN: [id number]  Date: 11/28/2016  DOB: 23-May-1955  Follow-Up Visit Note  CC: Maryellen Pile, MD  [name], MD  Diagnosis and Prior Radiotherapy:       ICD-10-CM   1. Malignant neoplasm of lower lobe of right lung ([place]) C34.31   2. 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 Patient completed treatment on 08/17/16. On 11/13/16 he was admitted at the request of Dr [name] for dysphagia, weight loss, malignant hypercalcemia, and palpable neck mass. During his admission he underwent CT Chest, Neck, and Head. These demonstrated interval develop of multiple enlarged mediastinal and hilar lymph nodes consistent with metastatic disease. There was also development of pulmonary nodules bilaterally. On the head and neck scans there were new lymph nodes seen within level IIa. No intracranial abnormalities were seen otherwise. She was concerned for disease recurrence. He began palliative chemotherapy consisting of Carboplatin/Taxol on 11/17/16, ordered by Dr [name]. Of significant note, on his arrival his vitals were concerning. Most notably he was tachycardic at 127, febrile at 100.7, hypotensive at 106/68. He states that he feels okay overall, but is suffering from chills and ongoing constipation. He has been using laxatives at home but this has not been helping with his constipation. Additionally, he reports that he has been experiencing dry mouth as well. Otherwise, he is without acute complaint.  ALLERGIES:  has No Known Allergies.  Meds: Current Outpatient Prescriptions  Medication Sig Dispense Refill  . cyclobenzaprine (FLEXERIL) 10 MG tablet TAKE ONE TABLET BY MOUTH AT BEDTIME (Patient taking differently: TAKE ONE TABLET BY MOUTH AT BEDTIME PRN FOR SPASMS) 30 tablet 1   . gabapentin (NEURONTIN) 300 MG capsule Take 1 capsule (300 mg total) by mouth 3 (three) times daily. 90 capsule 3  . lidocaine-prilocaine (EMLA) cream Apply to affected area once 30 g 3  . morphine (MSIR) 15 MG tablet Take 1 tablet (15 mg total) by mouth every 4 (four) hours as needed for severe pain. 60 tablet 0  . Nutritional Supplements (FEEDING SUPPLEMENT, OSMOLITE 1.5 CAL,) LIQD Begin Osmolite 1.5 via PEG TID with 60 mL free water. Advance slowly to  goal of 2 bottles QID with 60 mL free water before and after. In addition, drink or flush PEG with 240 mL free water TID. 6 Bottle 11  . Water For Irrigation, Sterile (FREE WATER) SOLN Place 100 mLs into feeding tube every 4 (four) hours. 1000 mL 0  . ondansetron (ZOFRAN) 8 MG tablet Take 1 tablet (8 mg total) by mouth 2 (two) times daily as needed. Start on the third day after chemotherapy. (Patient not taking: Reported on 10/18/2016) 30 tablet 1  . senna-docusate (SENOKOT-S) 8.6-50 MG tablet Take 1 tablet by mouth 2 (two) times daily. (Patient not taking: Reported on 11/13/2016) 60 tablet 3   No current facility-administered medications for this encounter.     Physical Findings: The patient is in no acute distress. Patient is alert and oriented. Wt Readings from Last 3 Encounters:  11/28/16 120 lb 12.8 oz (54.8 kg)  11/18/16 127 lb 13.9 oz (58 kg)  11/13/16 128 lb 14.4 oz (58.5 kg)    height is 5\' 3"  (1.6 m) and weight is 120 lb 12.8 oz (54.8 kg). His temperature is 100.7 F (38.2 C) (abnormal). His blood  pressure is 106/68 and his pulse is 127 (abnormal). His oxygen saturation is 94%. .  General: Alert and oriented, in no acute distress HEENT: Head is normocephalic. Extraocular movements are intact. Oropharynx is notable for stringy saliva but no mucositis. No concerning lesions within the oropharynx.  Cardiac: Tachycardic with regular rhythm. No murmurs, rubs, or gallops.  Pulmonary: Lungs CTA bilaterally. No wheezes, rales, or rhonchi.    Neck: No palpable tumor in masses in cervical or supraclavicular regions. Skin is healing well in the neck.  Skin: Skin in treatment fields shows satisfactory healing Lymphatics: see Neck Exam   Lab Findings: Lab Results  Component Value Date   WBC 12.3 (H) 11/18/2016   HGB 9.6 (L) 11/18/2016   HCT 27.9 (L) 11/18/2016   MCV 80.6 11/18/2016   PLT 257 11/18/2016    Lab Results  Component Value Date   TSH 0.091 (L) 07/24/2016    Radiographic Findings: Ct Head W Or Wo Contrast  Addendum Date: 11/15/2016   ADDENDUM REPORT: 11/15/2016 15:09 ADDENDUM: I discussed the case with Dr. Logan Bores, who had reviewed the case with the clinical team at tumor board on 11/15/2016. On further review, the confluent nodal mass at left level 2 has decreased in size compared to the PET CT of 06/08/2016. Precise measurement is difficult due to the loss of fat planes between the mass and the adjacent muscle. At right level 3, there is a low-density lymph node measuring 12 mm, which is new from the prior study. There is a new low-density lymph node at right level 4, measuring 12 mm. Electronically Signed   By: Ulyses Jarred M.D.   On: 11/15/2016 15:09   Result Date: 11/15/2016 CLINICAL DATA:  Worsening throat pain and dysphagia. Supraglottic laryngeal cancer. Status post radiation and chemotherapy. EXAM: CT HEAD WITHOUT AND WITH CONTRAST CT NECK WITH CONTRAST TECHNIQUE: Contiguous axial images were obtained from the base of the skull through the vertex without and with intravenous contrast. Multidetector CT imaging of the and neck was performed using the standard protocol following the bolus administration of intravenous contrast. CONTRAST:  172mL ISOVUE-300 IOPAMIDOL (ISOVUE-300) INJECTION 61% COMPARISON:  PET CT 06/08/2016 FINDINGS: CT HEAD FINDINGS Brain: No mass lesion, intraparenchymal hemorrhage or extra-axial collection. No evidence of acute cortical infarct. Brain parenchyma and CSF-containing spaces are  normal for age. No contrast-enhancing lesions. Vascular: No hyperdense vessel or unexpected calcification. Skull: Normal visualized skull base, calvarium and extracranial soft tissues. Sinuses/Orbits: No sinus fluid levels or advanced mucosal thickening. No mastoid effusion. Normal orbits. CT NECK FINDINGS Pharynx and larynx: --Nasopharynx: Fossae of Rosenmuller are clear. Normal adenoid tonsils for age. --Oral cavity and oropharynx: The palatine and lingual tonsils are normal. The visible oral cavity and floor of mouth are normal. --Hypopharynx: There is hypertrophy of the lingual tonsils. --Larynx: The epiglottis is thickened, compatible with history of radiation treatment. There is circumferential soft tissue thickening within the larynx. This is greatest at the posterior left aspect. --Retropharyngeal space: No abscess, effusion or lymphadenopathy. Salivary glands: --Parotid: No mass lesion or inflammation. No sialolithiasis or ductal dilatation. --Submandibular: Symmetric without inflammation. No sialolithiasis or ductal dilatation. --Sublingual: Normal. No ranula or other visible lesion of the base of tongue and floor of mouth. Thyroid: Normal. Lymph nodes: There is confluent lymphadenopathy at left level 2, encasing the proximal internal and external carotid arteries. This appears unchanged compared to the prior PET CT. Right level 5A node measures 9 mm. Vascular: There is aortic atherosclerotic calcification. There is mixed  calcified and noncalcified plaque within both common carotid arteries and at the carotid bifurcations. The there is an accessed right chest wall Port-A-Cath with its tip coursing beyond the field of view. Skeleton: No bony spinal canal stenosis. No lytic or blastic lesions. Upper chest: Please see dedicated report for chest CT. Other: None. IMPRESSION: 1. No acute intracranial abnormality. No contrast-enhancing brain lesions. 2. Unchanged appearance of circumferential supraglottic  laryngeal soft tissue thickening and confluent, masslike adenopathy at left level IIa compared to PET CT of 06/08/2016. 3. Thickening of the epiglottis is compatible with radiation treatment. 4. Please see report for dedicated chest CT for discussion of findings below the thoracic inlet. Electronically Signed: By: Ulyses Jarred M.D. On: 11/13/2016 18:50   Ct Soft Tissue Neck W Contrast  Addendum Date: 11/15/2016   ADDENDUM REPORT: 11/15/2016 15:09 ADDENDUM: I discussed the case with Dr. Logan Bores, who had reviewed the case with the clinical team at tumor board on 11/15/2016. On further review, the confluent nodal mass at left level 2 has decreased in size compared to the PET CT of 06/08/2016. Precise measurement is difficult due to the loss of fat planes between the mass and the adjacent muscle. At right level 3, there is a low-density lymph node measuring 12 mm, which is new from the prior study. There is a new low-density lymph node at right level 4, measuring 12 mm. Electronically Signed   By: Ulyses Jarred M.D.   On: 11/15/2016 15:09   Result Date: 11/15/2016 CLINICAL DATA:  Worsening throat pain and dysphagia. Supraglottic laryngeal cancer. Status post radiation and chemotherapy. EXAM: CT HEAD WITHOUT AND WITH CONTRAST CT NECK WITH CONTRAST TECHNIQUE: Contiguous axial images were obtained from the base of the skull through the vertex without and with intravenous contrast. Multidetector CT imaging of the and neck was performed using the standard protocol following the bolus administration of intravenous contrast. CONTRAST:  157mL ISOVUE-300 IOPAMIDOL (ISOVUE-300) INJECTION 61% COMPARISON:  PET CT 06/08/2016 FINDINGS: CT HEAD FINDINGS Brain: No mass lesion, intraparenchymal hemorrhage or extra-axial collection. No evidence of acute cortical infarct. Brain parenchyma and CSF-containing spaces are normal for age. No contrast-enhancing lesions. Vascular: No hyperdense vessel or unexpected calcification.  Skull: Normal visualized skull base, calvarium and extracranial soft tissues. Sinuses/Orbits: No sinus fluid levels or advanced mucosal thickening. No mastoid effusion. Normal orbits. CT NECK FINDINGS Pharynx and larynx: --Nasopharynx: Fossae of Rosenmuller are clear. Normal adenoid tonsils for age. --Oral cavity and oropharynx: The palatine and lingual tonsils are normal. The visible oral cavity and floor of mouth are normal. --Hypopharynx: There is hypertrophy of the lingual tonsils. --Larynx: The epiglottis is thickened, compatible with history of radiation treatment. There is circumferential soft tissue thickening within the larynx. This is greatest at the posterior left aspect. --Retropharyngeal space: No abscess, effusion or lymphadenopathy. Salivary glands: --Parotid: No mass lesion or inflammation. No sialolithiasis or ductal dilatation. --Submandibular: Symmetric without inflammation. No sialolithiasis or ductal dilatation. --Sublingual: Normal. No ranula or other visible lesion of the base of tongue and floor of mouth. Thyroid: Normal. Lymph nodes: There is confluent lymphadenopathy at left level 2, encasing the proximal internal and external carotid arteries. This appears unchanged compared to the prior PET CT. Right level 5A node measures 9 mm. Vascular: There is aortic atherosclerotic calcification. There is mixed calcified and noncalcified plaque within both common carotid arteries and at the carotid bifurcations. The there is an accessed right chest wall Port-A-Cath with its tip coursing beyond the field of view. Skeleton:  No bony spinal canal stenosis. No lytic or blastic lesions. Upper chest: Please see dedicated report for chest CT. Other: None. IMPRESSION: 1. No acute intracranial abnormality. No contrast-enhancing brain lesions. 2. Unchanged appearance of circumferential supraglottic laryngeal soft tissue thickening and confluent, masslike adenopathy at left level IIa compared to PET CT of  06/08/2016. 3. Thickening of the epiglottis is compatible with radiation treatment. 4. Please see report for dedicated chest CT for discussion of findings below the thoracic inlet. Electronically Signed: By: Ulyses Jarred M.D. On: 11/13/2016 18:50   Ct Chest W Contrast  Result Date: 11/13/2016 CLINICAL DATA:  History of head and neck cancer with throat pain and dysphagia EXAM: CT CHEST WITH CONTRAST TECHNIQUE: Multidetector CT imaging of the chest was performed during intravenous contrast administration. CONTRAST:  123mL ISOVUE-300 IOPAMIDOL (ISOVUE-300) INJECTION 61% COMPARISON:  PET-CT 06/08/2016 FINDINGS: Cardiovascular: Nonaneurysmal aorta. Atherosclerotic vascular calcifications. Coronary artery calcification. Central venous catheter tip terminates at the cavoatrial junction. Normal heart size. No significant pericardial effusion. Mediastinum/Nodes: Since the previous PET-CT, development of multiple enlarged mediastinal lymph nodes. Right paratracheal lymph node measures 32 x 22 cm. AP window lymph node measures 16 mm. subcarinal lymph node measures 23 mm. A lymph node anterior to the pericardium measures 6 mm. Development of bilateral hilar adenopathy, measuring 15 mm on the right and 17 mm on the left. Midline trachea.  No thyroid mass.  Esophagus within normal limits. Lungs/Pleura: Development of a small right-sided pleural effusion. Suspect increased size of a right lower lobe lung mass but increased consolidation in the right lower lobe makes assessment difficult. Mass measures approximately 2.6 cm, compared with 1.9 cm previously. Development of multiple bilateral ill-defined pulmonary nodules within the upper lobes and bilateral lower lobes. Patchy consolidation within the right lower lobe. Upper Abdomen: Subcentimeter hypodense liver foci too small to further characterize. Subcentimeter low-density lesion mid to upper left kidney. Musculoskeletal: No acute or suspicious bone lesions IMPRESSION: 1.  Interim development of multiple enlarged mediastinal and hilar lymph nodes consistent with metastatic disease. 2. Development of multiple bilateral pulmonary nodules, also suspicious for metastatic disease. Suspect that the previously noted right lower lobe lung mass has increased in size, now measuring about 26 mm compared with 19 mm previously. 3. New small right-sided pleural effusion. Patchy consolidations in the right lower lobe, uncertain if this represents pneumonia, aspiration, or metastatic nodules. 4. Subcentimeter hypodense foci in the liver, too small to further characterize. When the patient is clinically stable and able to follow directions and hold their breath (preferably as an outpatient) further evaluation with dedicated abdominal MRI may be considered. Aortic Atherosclerosis (ICD10-I70.0). Electronically Signed   By: Donavan Foil M.D.   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    2) Nutritional Status:  PEG tube: In place, patient not currently using.  Push intake po, and by PEG prn  3) Risk Factors: The patient has been educated about risk factors including alcohol and tobacco abuse; they understand that avoidance of alcohol and tobacco is important to prevent recurrences as well as other cancers. Pt reports that he has stopped smoking at this time. Pt voices that he intermittently consumes beer, and was advised to cut back on ETOH use.   4) Swallowing: continues to have some difficulty, currently managed my Dr [name].   5) Dental: Encouraged to continue regular followup with dentistry, and  dental hygiene including fluoride rinses.  6) Thyroid function: I will ask [name] to recheck this in the next 6 months; if she cannot, [name], RN, our Head and Neck Oncology Navigator will find a PCP to do so. Lab Results  Component Value Date   TSH 0.091 (L)  07/24/2016    7) PRN f/u in radiation oncology. I advised him to use miralax OTC supplementally to assist with his constipation. Continue to follow up with Dr [name] for ongoing management of his metastatic cancer.  Nursing contacting her now re: fever and other vitals of concern, while on chemotherapy.     [name], MD   This document serves as a record of services personally performed by [name], MD. It was created on her behalf by Reola Mosher, a trained medical scribe. The creation of this record is based on the scribe's personal observations and the provider's statements to them. This document has been checked and approved by the attending provider.

## 2016-11-29 ENCOUNTER — Ambulatory Visit: Payer: Medicare Other

## 2016-11-29 ENCOUNTER — Ambulatory Visit (HOSPITAL_BASED_OUTPATIENT_CLINIC_OR_DEPARTMENT_OTHER): Payer: Medicare Other | Admitting: Hematology and Oncology

## 2016-11-29 ENCOUNTER — Ambulatory Visit (HOSPITAL_BASED_OUTPATIENT_CLINIC_OR_DEPARTMENT_OTHER): Payer: Medicare Other

## 2016-11-29 ENCOUNTER — Encounter: Payer: Self-pay | Admitting: Hematology and Oncology

## 2016-11-29 ENCOUNTER — Other Ambulatory Visit (HOSPITAL_BASED_OUTPATIENT_CLINIC_OR_DEPARTMENT_OTHER): Payer: Medicare Other

## 2016-11-29 DIAGNOSIS — K5909 Other constipation: Secondary | ICD-10-CM | POA: Diagnosis not present

## 2016-11-29 DIAGNOSIS — C329 Malignant neoplasm of larynx, unspecified: Secondary | ICD-10-CM | POA: Diagnosis not present

## 2016-11-29 DIAGNOSIS — I69391 Dysphagia following cerebral infarction: Secondary | ICD-10-CM | POA: Diagnosis not present

## 2016-11-29 DIAGNOSIS — R634 Abnormal weight loss: Secondary | ICD-10-CM

## 2016-11-29 DIAGNOSIS — R1310 Dysphagia, unspecified: Secondary | ICD-10-CM | POA: Diagnosis not present

## 2016-11-29 DIAGNOSIS — Z5111 Encounter for antineoplastic chemotherapy: Secondary | ICD-10-CM

## 2016-11-29 DIAGNOSIS — G893 Neoplasm related pain (acute) (chronic): Secondary | ICD-10-CM

## 2016-11-29 DIAGNOSIS — C770 Secondary and unspecified malignant neoplasm of lymph nodes of head, face and neck: Secondary | ICD-10-CM

## 2016-11-29 DIAGNOSIS — Z95828 Presence of other vascular implants and grafts: Secondary | ICD-10-CM

## 2016-11-29 DIAGNOSIS — C760 Malignant neoplasm of head, face and neck: Secondary | ICD-10-CM | POA: Diagnosis not present

## 2016-11-29 DIAGNOSIS — C7900 Secondary malignant neoplasm of unspecified kidney and renal pelvis: Secondary | ICD-10-CM | POA: Diagnosis not present

## 2016-11-29 DIAGNOSIS — E440 Moderate protein-calorie malnutrition: Secondary | ICD-10-CM | POA: Diagnosis not present

## 2016-11-29 DIAGNOSIS — C321 Malignant neoplasm of supraglottis: Secondary | ICD-10-CM | POA: Diagnosis not present

## 2016-11-29 DIAGNOSIS — Z7189 Other specified counseling: Secondary | ICD-10-CM

## 2016-11-29 LAB — COMPREHENSIVE METABOLIC PANEL
ALBUMIN: 2.7 g/dL — AB (ref 3.5–5.0)
ALT: 50 U/L (ref 0–55)
ANION GAP: 9 meq/L (ref 3–11)
AST: 32 U/L (ref 5–34)
Alkaline Phosphatase: 90 U/L (ref 40–150)
BUN: 18.4 mg/dL (ref 7.0–26.0)
CALCIUM: 9.6 mg/dL (ref 8.4–10.4)
CHLORIDE: 102 meq/L (ref 98–109)
CO2: 28 meq/L (ref 22–29)
Creatinine: 0.9 mg/dL (ref 0.7–1.3)
Glucose: 160 mg/dl — ABNORMAL HIGH (ref 70–140)
POTASSIUM: 3.7 meq/L (ref 3.5–5.1)
Sodium: 139 mEq/L (ref 136–145)
Total Bilirubin: 0.82 mg/dL (ref 0.20–1.20)
Total Protein: 6.6 g/dL (ref 6.4–8.3)

## 2016-11-29 LAB — CBC WITH DIFFERENTIAL/PLATELET
BASO%: 0.1 % (ref 0.0–2.0)
BASOS ABS: 0 10*3/uL (ref 0.0–0.1)
EOS ABS: 0 10*3/uL (ref 0.0–0.5)
EOS%: 0.1 % (ref 0.0–7.0)
HCT: 27 % — ABNORMAL LOW (ref 38.4–49.9)
HEMOGLOBIN: 8.9 g/dL — AB (ref 13.0–17.1)
LYMPH%: 7.6 % — AB (ref 14.0–49.0)
MCH: 26.6 pg — ABNORMAL LOW (ref 27.2–33.4)
MCHC: 33 g/dL (ref 32.0–36.0)
MCV: 80.8 fL (ref 79.3–98.0)
MONO#: 0.6 10*3/uL (ref 0.1–0.9)
MONO%: 5.9 % (ref 0.0–14.0)
NEUT%: 86.3 % — ABNORMAL HIGH (ref 39.0–75.0)
NEUTROS ABS: 8.1 10*3/uL — AB (ref 1.5–6.5)
PLATELETS: 227 10*3/uL (ref 140–400)
RBC: 3.34 10*6/uL — ABNORMAL LOW (ref 4.20–5.82)
RDW: 16.1 % — AB (ref 11.0–14.6)
WBC: 9.4 10*3/uL (ref 4.0–10.3)
lymph#: 0.7 10*3/uL — ABNORMAL LOW (ref 0.9–3.3)

## 2016-11-29 LAB — MAGNESIUM: Magnesium: 2.3 mg/dl (ref 1.5–2.5)

## 2016-11-29 MED ORDER — SODIUM CHLORIDE 0.9 % IV SOLN
20.0000 mg | Freq: Once | INTRAVENOUS | Status: AC
  Administered 2016-11-29: 20 mg via INTRAVENOUS
  Filled 2016-11-29: qty 2

## 2016-11-29 MED ORDER — FAMOTIDINE IN NACL 20-0.9 MG/50ML-% IV SOLN
20.0000 mg | Freq: Once | INTRAVENOUS | Status: AC
  Administered 2016-11-29: 20 mg via INTRAVENOUS

## 2016-11-29 MED ORDER — FAMOTIDINE IN NACL 20-0.9 MG/50ML-% IV SOLN
INTRAVENOUS | Status: AC
  Filled 2016-11-29: qty 50

## 2016-11-29 MED ORDER — SODIUM CHLORIDE 0.9% FLUSH
10.0000 mL | INTRAVENOUS | Status: DC | PRN
Start: 2016-11-29 — End: 2016-11-29
  Administered 2016-11-29: 10 mL
  Filled 2016-11-29: qty 10

## 2016-11-29 MED ORDER — SODIUM CHLORIDE 0.9 % IV SOLN
190.0000 mg | Freq: Once | INTRAVENOUS | Status: AC
  Administered 2016-11-29: 190 mg via INTRAVENOUS
  Filled 2016-11-29: qty 19

## 2016-11-29 MED ORDER — PALONOSETRON HCL INJECTION 0.25 MG/5ML
INTRAVENOUS | Status: AC
  Filled 2016-11-29: qty 5

## 2016-11-29 MED ORDER — DIPHENHYDRAMINE HCL 50 MG/ML IJ SOLN
INTRAMUSCULAR | Status: AC
  Filled 2016-11-29: qty 1

## 2016-11-29 MED ORDER — DIPHENHYDRAMINE HCL 50 MG/ML IJ SOLN
50.0000 mg | Freq: Once | INTRAMUSCULAR | Status: AC
  Administered 2016-11-29: 50 mg via INTRAVENOUS

## 2016-11-29 MED ORDER — PALONOSETRON HCL INJECTION 0.25 MG/5ML
0.2500 mg | Freq: Once | INTRAVENOUS | Status: AC
  Administered 2016-11-29: 0.25 mg via INTRAVENOUS

## 2016-11-29 MED ORDER — SODIUM CHLORIDE 0.9 % IV SOLN
80.0000 mg/m2 | Freq: Once | INTRAVENOUS | Status: AC
  Administered 2016-11-29: 126 mg via INTRAVENOUS
  Filled 2016-11-29: qty 21

## 2016-11-29 MED ORDER — SODIUM CHLORIDE 0.9% FLUSH
10.0000 mL | Freq: Once | INTRAVENOUS | Status: AC
  Administered 2016-11-29: 10 mL
  Filled 2016-11-29: qty 10

## 2016-11-29 MED ORDER — HEPARIN SOD (PORK) LOCK FLUSH 100 UNIT/ML IV SOLN
500.0000 [IU] | Freq: Once | INTRAVENOUS | Status: AC | PRN
  Administered 2016-11-29: 500 [IU]
  Filled 2016-11-29: qty 5

## 2016-11-29 MED ORDER — SODIUM CHLORIDE 0.9 % IV SOLN
Freq: Once | INTRAVENOUS | Status: AC
  Administered 2016-11-29: 12:00:00 via INTRAVENOUS

## 2016-11-29 NOTE — Assessment & Plan Note (Signed)
His calcium level remained within normal limits Continue to observe closely

## 2016-11-29 NOTE — Patient Instructions (Signed)
[facility] Discharge Instructions for Patients Receiving Chemotherapy  Today you received the following chemotherapy agents: Taxol and Carboplatin.  To help prevent nausea and vomiting after your treatment, we encourage you to take your nausea medication as directed. May take Zofran 3 days after treatment for nausea.   If you develop nausea and vomiting that is not controlled by your nausea medication, call the clinic.   BELOW ARE SYMPTOMS THAT SHOULD BE REPORTED IMMEDIATELY:  *FEVER GREATER THAN 100.5 F  *CHILLS WITH OR WITHOUT FEVER  NAUSEA AND VOMITING THAT IS NOT CONTROLLED WITH YOUR NAUSEA MEDICATION  *UNUSUAL SHORTNESS OF BREATH  *UNUSUAL BRUISING OR BLEEDING  TENDERNESS IN MOUTH AND THROAT WITH OR WITHOUT PRESENCE OF ULCERS  *URINARY PROBLEMS  *BOWEL PROBLEMS  UNUSUAL RASH Items with * indicate a potential emergency and should be followed up as soon as possible.  Feel free to call the clinic you have any questions or concerns. The clinic phone number is [phone].  Please show the [facility] at check-in to the Emergency Department and triage nurse.  Paclitaxel injection What is this medicine? PACLITAXEL (PAK li TAX el) is a chemotherapy drug. It targets fast dividing cells, like cancer cells, and causes these cells to die. This medicine is used to treat ovarian cancer, breast cancer, and other cancers. This medicine may be used for other purposes; ask your health care provider or pharmacist if you have questions. COMMON BRAND NAME(S): Onxol, Taxol What should I tell my health care provider before I take this medicine? They need to know if you have any of these conditions: -blood disorders -irregular heartbeat -infection (especially a virus infection such as chickenpox, cold sores, or herpes) -liver disease -previous or ongoing radiation therapy -an unusual or allergic reaction to paclitaxel, alcohol, polyoxyethylated castor oil, other  chemotherapy agents, other medicines, foods, dyes, or preservatives -pregnant or trying to get pregnant -breast-feeding How should I use this medicine? This drug is given as an infusion into a vein. It is administered in a hospital or clinic by a specially trained health care professional. Talk to your pediatrician regarding the use of this medicine in children. Special care may be needed. Overdosage: If you think you have taken too much of this medicine contact a poison control center or emergency room at once. NOTE: This medicine is only for you. Do not share this medicine with others. What if I miss a dose? It is important not to miss your dose. Call your doctor or health care professional if you are unable to keep an appointment. What may interact with this medicine? Do not take this medicine with any of the following medications: -disulfiram -metronidazole This medicine may also interact with the following medications: -cyclosporine -diazepam -ketoconazole -medicines to increase blood counts like filgrastim, pegfilgrastim, sargramostim -other chemotherapy drugs like cisplatin, doxorubicin, epirubicin, etoposide, teniposide, vincristine -quinidine -testosterone -vaccines -verapamil Talk to your doctor or health care professional before taking any of these medicines: -acetaminophen -aspirin -ibuprofen -ketoprofen -naproxen This list may not describe all possible interactions. Give your health care provider a list of all the medicines, herbs, non-prescription drugs, or dietary supplements you use. Also tell them if you smoke, drink alcohol, or use illegal drugs. Some items may interact with your medicine. What should I watch for while using this medicine? Your condition will be monitored carefully while you are receiving this medicine. You will need important blood work done while you are taking this medicine. This medicine can cause serious allergic  reactions. To reduce your risk  you will need to take other medicine(s) before treatment with this medicine. If you experience allergic reactions like skin rash, itching or hives, swelling of the face, lips, or tongue, tell your doctor or health care professional right away. In some cases, you may be given additional medicines to help with side effects. Follow all directions for their use. This drug may make you feel generally unwell. This is not uncommon, as chemotherapy can affect healthy cells as well as cancer cells. Report any side effects. Continue your course of treatment even though you feel ill unless your doctor tells you to stop. Call your doctor or health care professional for advice if you get a fever, chills or sore throat, or other symptoms of a cold or flu. Do not treat yourself. This drug decreases your body's ability to fight infections. Try to avoid being around people who are sick. This medicine may increase your risk to bruise or bleed. Call your doctor or health care professional if you notice any unusual bleeding. Be careful brushing and flossing your teeth or using a toothpick because you may get an infection or bleed more easily. If you have any dental work done, tell your dentist you are receiving this medicine. Avoid taking products that contain aspirin, acetaminophen, ibuprofen, naproxen, or ketoprofen unless instructed by your doctor. These medicines may hide a fever. Do not become pregnant while taking this medicine. Women should inform their doctor if they wish to become pregnant or think they might be pregnant. There is a potential for serious side effects to an unborn child. Talk to your health care professional or pharmacist for more information. Do not breast-feed an infant while taking this medicine. Men are advised not to father a child while receiving this medicine. This product may contain alcohol. Ask your pharmacist or healthcare provider if this medicine contains alcohol. Be sure to tell all  healthcare providers you are taking this medicine. Certain medicines, like metronidazole and disulfiram, can cause an unpleasant reaction when taken with alcohol. The reaction includes flushing, headache, nausea, vomiting, sweating, and increased thirst. The reaction can last from 30 minutes to several hours. What side effects may I notice from receiving this medicine? Side effects that you should report to your doctor or health care professional as soon as possible: -allergic reactions like skin rash, itching or hives, swelling of the face, lips, or tongue -low blood counts - This drug may decrease the number of white blood cells, red blood cells and platelets. You may be at increased risk for infections and bleeding. -signs of infection - fever or chills, cough, sore throat, pain or difficulty passing urine -signs of decreased platelets or bleeding - bruising, pinpoint red spots on the skin, black, tarry stools, nosebleeds -signs of decreased red blood cells - unusually weak or tired, fainting spells, lightheadedness -breathing problems -chest pain -high or low blood pressure -mouth sores -nausea and vomiting -pain, swelling, redness or irritation at the injection site -pain, tingling, numbness in the hands or feet -slow or irregular heartbeat -swelling of the ankle, feet, hands Side effects that usually do not require medical attention (report to your doctor or health care professional if they continue or are bothersome): -bone pain -complete hair loss including hair on your head, underarms, pubic hair, eyebrows, and eyelashes -changes in the color of fingernails -diarrhea -loosening of the fingernails -loss of appetite -muscle or joint pain -red flush to skin -sweating This list may not describe all possible  side effects. Call your doctor for medical advice about side effects. You may report side effects to FDA at 1-800-FDA-1088. Where should I keep my medicine? This drug is given in  a hospital or clinic and will not be stored at home. NOTE: This sheet is a summary. It may not cover all possible information. If you have questions about this medicine, talk to your doctor, pharmacist, or health care provider.  2018 [employer] Standard (2014-12-01 19:58:00) Carboplatin injection What is this medicine? CARBOPLATIN (KAR boe pla tin) is a chemotherapy drug. It targets fast dividing cells, like cancer cells, and causes these cells to die. This medicine is used to treat ovarian cancer and many other cancers. This medicine may be used for other purposes; ask your health care provider or pharmacist if you have questions. COMMON BRAND NAME(S): Paraplatin What should I tell my health care provider before I take this medicine? They need to know if you have any of these conditions: -blood disorders -hearing problems -kidney disease -recent or ongoing radiation therapy -an unusual or allergic reaction to carboplatin, cisplatin, other chemotherapy, other medicines, foods, dyes, or preservatives -pregnant or trying to get pregnant -breast-feeding How should I use this medicine? This drug is usually given as an infusion into a vein. It is administered in a hospital or clinic by a specially trained health care professional. Talk to your pediatrician regarding the use of this medicine in children. Special care may be needed. Overdosage: If you think you have taken too much of this medicine contact a poison control center or emergency room at once. NOTE: This medicine is only for you. Do not share this medicine with others. What if I miss a dose? It is important not to miss a dose. Call your doctor or health care professional if you are unable to keep an appointment. What may interact with this medicine? -medicines for seizures -medicines to increase blood counts like filgrastim, pegfilgrastim, sargramostim -some antibiotics like amikacin, gentamicin, neomycin, streptomycin,  tobramycin -vaccines Talk to your doctor or health care professional before taking any of these medicines: -acetaminophen -aspirin -ibuprofen -ketoprofen -naproxen This list may not describe all possible interactions. Give your health care provider a list of all the medicines, herbs, non-prescription drugs, or dietary supplements you use. Also tell them if you smoke, drink alcohol, or use illegal drugs. Some items may interact with your medicine. What should I watch for while using this medicine? Your condition will be monitored carefully while you are receiving this medicine. You will need important blood work done while you are taking this medicine. This drug may make you feel generally unwell. This is not uncommon, as chemotherapy can affect healthy cells as well as cancer cells. Report any side effects. Continue your course of treatment even though you feel ill unless your doctor tells you to stop. In some cases, you may be given additional medicines to help with side effects. Follow all directions for their use. Call your doctor or health care professional for advice if you get a fever, chills or sore throat, or other symptoms of a cold or flu. Do not treat yourself. This drug decreases your body's ability to fight infections. Try to avoid being around people who are sick. This medicine may increase your risk to bruise or bleed. Call your doctor or health care professional if you notice any unusual bleeding. Be careful brushing and flossing your teeth or using a toothpick because you may get an infection or bleed more easily. If you have  any dental work done, tell your dentist you are receiving this medicine. Avoid taking products that contain aspirin, acetaminophen, ibuprofen, naproxen, or ketoprofen unless instructed by your doctor. These medicines may hide a fever. Do not become pregnant while taking this medicine. Women should inform their doctor if they wish to become pregnant or think  they might be pregnant. There is a potential for serious side effects to an unborn child. Talk to your health care professional or pharmacist for more information. Do not breast-feed an infant while taking this medicine. What side effects may I notice from receiving this medicine? Side effects that you should report to your doctor or health care professional as soon as possible: -allergic reactions like skin rash, itching or hives, swelling of the face, lips, or tongue -signs of infection - fever or chills, cough, sore throat, pain or difficulty passing urine -signs of decreased platelets or bleeding - bruising, pinpoint red spots on the skin, black, tarry stools, nosebleeds -signs of decreased red blood cells - unusually weak or tired, fainting spells, lightheadedness -breathing problems -changes in hearing -changes in vision -chest pain -high blood pressure -low blood counts - This drug may decrease the number of white blood cells, red blood cells and platelets. You may be at increased risk for infections and bleeding. -nausea and vomiting -pain, swelling, redness or irritation at the injection site -pain, tingling, numbness in the hands or feet -problems with balance, talking, walking -trouble passing urine or change in the amount of urine Side effects that usually do not require medical attention (report to your doctor or health care professional if they continue or are bothersome): -hair loss -loss of appetite -metallic taste in the mouth or changes in taste This list may not describe all possible side effects. Call your doctor for medical advice about side effects. You may report side effects to FDA at 1-800-FDA-1088. Where should I keep my medicine? This drug is given in a hospital or clinic and will not be stored at home. NOTE: This sheet is a summary. It may not cover all possible information. If you have questions about this medicine, talk to your doctor, pharmacist, or health care  provider.  2018 [employer] Standard (2007-05-07 14:38:05)

## 2016-11-29 NOTE — Progress Notes (Signed)
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 LEFT neck lymphadenopathy including LEFT level IIa to level 3 4.7 x 4.6 x 7.6 cm necrotic nodal conglomeration with suspected extracapsular spread of disease. Moderately narrowed hypopharynx.       05/18/2016 Procedure    Technically successful ultrasound-guided core biopsy of left cervical adenopathy.       05/18/2016 Pathology Results    Lymph node, needle/core biopsy, L cerv LAN - SQUAMOUS CELL CARCINOMA, SEE COMMENT. Microscopic Comment Residual lymph node is not seen. There is tumor invading skeletal muscle. p16 weakly positive      06/06/2016 Procedure    He had dental extraction      06/08/2016 PET scan    1. 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 2. Hypermetabolic right lower lobe pulmonary nodule is favored to represent a synchronous primary bronchogenic neoplasm. Isolated pulmonary metastasis could look similar. No thoracic nodal hypermetabolism. 3. Soft tissue hypermetabolism about the right iliac bone and greater trochanter of the proximal right femur are favored to be degenerative or posttraumatic. Soft tissue  metastasis felt highly unlikely. 4.  Coronary artery atherosclerosis. Aortic atherosclerosis. 5. A tiny right lower lobe pulmonary nodule is most likely a subpleural lymph node.      06/15/2016 Procedure    He has near normal baseline hearing test      06/23/2016 Procedure    Ultrasound and fluoroscopically guided right internal jugular single lumen power port catheter insertion. Tip in the SVC/RA junction. Catheter ready for use      06/23/2016 Procedure    Fluoroscopic insertion of a 20-French "pull-through" gastrostomy.      06/28/2016 - 08/17/2016 Radiation Therapy    Radiation treatment dates:   06/28/16 - 08/17/16  Site/dose:     1) Larynx and neck / 70 Gy in 35 fractions to gross disease, 63 Gy in 35 fractions to high risk nodal echelons, and 56 Gy in 35 fractions to intermediate risk nodal echelons                         2) Right Lower Lung treated to 54 Gy in 3 fractions  Beams/energy:   1)  IMRT // 6 MV photons                              2) SBRT/SRT-VMAT // 6X-FFF        06/30/2016 - 08/11/2016 Chemotherapy    He received chemotherapy with high dose cisplatin      11/13/2016 Imaging    1. Interim development of multiple enlarged mediastinal and hilar lymph nodes consistent with metastatic disease. 2. Development of multiple bilateral pulmonary nodules, also suspicious for metastatic disease. Suspect that  the previously noted right lower lobe lung mass has increased in size, now measuring about 26 mm compared with 19 mm previously. 3. New small right-sided pleural effusion. Patchy consolidations in the right lower lobe, uncertain if this represents pneumonia, aspiration, or metastatic nodules. 4. Subcentimeter hypodense foci in the liver, too small to further characterize. When the patient is clinically stable and able to follow directions and hold their breath (preferably as an outpatient) further evaluation with dedicated abdominal MRI may be considered.  Aortic Atherosclerosis  (ICD10-I70.0).      11/13/2016 - 11/19/2016 Hospital Admission    The patient was hospitalized for management of malignant hypercalcemia.  He was started on palliative chemotherapy on November 17, 2016      11/17/2016 -  Chemotherapy    He received combination chemotherapy with carboplatin and Taxol       INTERVAL HISTORY: Please see below for problem oriented charting. He returns for further follow-up and chemotherapy His pain is stable He continued to take morphine as needed He developed recent constipation, resolved with laxative He is taking 5-6 nutritional supplement per day but have lost some weight Denies recent nausea of neuropathy Denies recent fever, chills or cough  REVIEW OF SYSTEMS:   Constitutional: Denies fevers, chills  Eyes: Denies blurriness of vision Ears, nose, mouth, throat, and face: Denies mucositis or sore throat Respiratory: Denies cough, dyspnea or wheezes Cardiovascular: Denies palpitation, chest discomfort or lower extremity swelling Skin: Denies abnormal skin rashes Lymphatics: Denies new lymphadenopathy or easy bruising Neurological:Denies numbness, tingling or new weaknesses Behavioral/Psych: Mood is stable, no new changes  All other systems were reviewed with the patient and are negative.  I have reviewed the past medical history, past surgical history, social history and family history with the patient and they are unchanged from previous note.  ALLERGIES:  has No Known Allergies.  MEDICATIONS:  Current Outpatient Prescriptions  Medication Sig Dispense Refill  . gabapentin (NEURONTIN) 300 MG capsule Take 1 capsule (300 mg total) by mouth 3 (three) times daily. 90 capsule 3  . lidocaine-prilocaine (EMLA) cream Apply to affected area once 30 g 3  . morphine (MSIR) 15 MG tablet Take 1 tablet (15 mg total) by mouth every 4 (four) hours as needed for severe pain. 60 tablet 0  . Nutritional Supplements (FEEDING SUPPLEMENT, OSMOLITE 1.5 CAL,) LIQD  Begin Osmolite 1.5 via PEG TID with 60 mL free water. Advance slowly to  goal of 2 bottles QID with 60 mL free water before and after. In addition, drink or flush PEG with 240 mL free water TID. 6 Bottle 11  . ondansetron (ZOFRAN) 8 MG tablet Take 1 tablet (8 mg total) by mouth 2 (two) times daily as needed. Start on the third day after chemotherapy. (Patient not taking: Reported on 10/18/2016) 30 tablet 1  . senna-docusate (SENOKOT-S) 8.6-50 MG tablet Take 1 tablet by mouth 2 (two) times daily. (Patient not taking: Reported on 11/13/2016) 60 tablet 3  . Water For Irrigation, Sterile (FREE WATER) SOLN Place 100 mLs into feeding tube every 4 (four) hours. 1000 mL 0   No current facility-administered medications for this visit.     PHYSICAL EXAMINATION: ECOG PERFORMANCE STATUS: 2 - Symptomatic, <50% confined to bed  Vitals:   11/29/16 1132  BP: 126/70  Pulse: (!) 116  Resp: 18  Temp: 99.5 F (37.5 C)  SpO2: 96%   Filed Weights   11/29/16 1132  Weight: 121 lb 8 oz (55.1 kg)    GENERAL:alert, no  distress and comfortable SKIN: skin color, texture, turgor are normal, no rashes or significant lesions EYES: normal, Conjunctiva are pink and non-injected, sclera clear OROPHARYNX:no exudate, no erythema and lips, buccal mucosa, and tongue normal  NECK: supple, thyroid normal size, non-tender, without nodularity LYMPH:  no palpable lymphadenopathy in the cervical, axillary or inguinal LUNGS: clear to auscultation and percussion with normal breathing effort HEART: regular rate & rhythm and no murmurs and no lower extremity edema ABDOMEN:abdomen soft, non-tender and normal bowel sounds Musculoskeletal:no cyanosis of digits and no clubbing  NEURO: alert & oriented x 3 with fluent speech, no focal motor/sensory deficits  LABORATORY DATA:  I have reviewed the data as listed    Component Value Date/Time   NA 139 11/29/2016 1037   K 3.7 11/29/2016 1037   CL 101 11/19/2016 0500   CO2 28  11/29/2016 1037   GLUCOSE 160 (H) 11/29/2016 1037   BUN 18.4 11/29/2016 1037   CREATININE 0.9 11/29/2016 1037   CALCIUM 9.6 11/29/2016 1037   PROT 6.6 11/29/2016 1037   ALBUMIN 2.7 (L) 11/29/2016 1037   AST 32 11/29/2016 1037   ALT 50 11/29/2016 1037   ALKPHOS 90 11/29/2016 1037   BILITOT 0.82 11/29/2016 1037   GFRNONAA >60 11/19/2016 0500   GFRAA >60 11/19/2016 0500    No results found for: SPEP, UPEP  Lab Results  Component Value Date   WBC 9.4 11/29/2016   NEUTROABS 8.1 (H) 11/29/2016   HGB 8.9 (L) 11/29/2016   HCT 27.0 (L) 11/29/2016   MCV 80.8 11/29/2016   PLT 227 11/29/2016      Chemistry      Component Value Date/Time   NA 139 11/29/2016 1037   K 3.7 11/29/2016 1037   CL 101 11/19/2016 0500   CO2 28 11/29/2016 1037   BUN 18.4 11/29/2016 1037   CREATININE 0.9 11/29/2016 1037      Component Value Date/Time   CALCIUM 9.6 11/29/2016 1037   ALKPHOS 90 11/29/2016 1037   AST 32 11/29/2016 1037   ALT 50 11/29/2016 1037   BILITOT 0.82 11/29/2016 1037       RADIOGRAPHIC STUDIES: I have personally reviewed the radiological images as listed and agreed with the findings in the report. Ct Head W Or Wo Contrast  Addendum Date: 11/15/2016   ADDENDUM REPORT: 11/15/2016 15:09 ADDENDUM: I discussed the case with Dr. Logan Bores, who had reviewed the case with the clinical team at tumor board on 11/15/2016. On further review, the confluent nodal mass at left level 2 has decreased in size compared to the PET CT of 06/08/2016. Precise measurement is difficult due to the loss of fat planes between the mass and the adjacent muscle. At right level 3, there is a low-density lymph node measuring 12 mm, which is new from the prior study. There is a new low-density lymph node at right level 4, measuring 12 mm. Electronically Signed   By: Ulyses Jarred M.D.   On: 11/15/2016 15:09   Result Date: 11/15/2016 CLINICAL DATA:  Worsening throat pain and dysphagia. Supraglottic laryngeal  cancer. Status post radiation and chemotherapy. EXAM: CT HEAD WITHOUT AND WITH CONTRAST CT NECK WITH CONTRAST TECHNIQUE: Contiguous axial images were obtained from the base of the skull through the vertex without and with intravenous contrast. Multidetector CT imaging of the and neck was performed using the standard protocol following the bolus administration of intravenous contrast. CONTRAST:  179mL ISOVUE-300 IOPAMIDOL (ISOVUE-300) INJECTION 61% COMPARISON:  PET CT 06/08/2016 FINDINGS: CT HEAD  FINDINGS Brain: No mass lesion, intraparenchymal hemorrhage or extra-axial collection. No evidence of acute cortical infarct. Brain parenchyma and CSF-containing spaces are normal for age. No contrast-enhancing lesions. Vascular: No hyperdense vessel or unexpected calcification. Skull: Normal visualized skull base, calvarium and extracranial soft tissues. Sinuses/Orbits: No sinus fluid levels or advanced mucosal thickening. No mastoid effusion. Normal orbits. CT NECK FINDINGS Pharynx and larynx: --Nasopharynx: Fossae of Rosenmuller are clear. Normal adenoid tonsils for age. --Oral cavity and oropharynx: The palatine and lingual tonsils are normal. The visible oral cavity and floor of mouth are normal. --Hypopharynx: There is hypertrophy of the lingual tonsils. --Larynx: The epiglottis is thickened, compatible with history of radiation treatment. There is circumferential soft tissue thickening within the larynx. This is greatest at the posterior left aspect. --Retropharyngeal space: No abscess, effusion or lymphadenopathy. Salivary glands: --Parotid: No mass lesion or inflammation. No sialolithiasis or ductal dilatation. --Submandibular: Symmetric without inflammation. No sialolithiasis or ductal dilatation. --Sublingual: Normal. No ranula or other visible lesion of the base of tongue and floor of mouth. Thyroid: Normal. Lymph nodes: There is confluent lymphadenopathy at left level 2, encasing the proximal internal and  external carotid arteries. This appears unchanged compared to the prior PET CT. Right level 5A node measures 9 mm. Vascular: There is aortic atherosclerotic calcification. There is mixed calcified and noncalcified plaque within both common carotid arteries and at the carotid bifurcations. The there is an accessed right chest wall Port-A-Cath with its tip coursing beyond the field of view. Skeleton: No bony spinal canal stenosis. No lytic or blastic lesions. Upper chest: Please see dedicated report for chest CT. Other: None. IMPRESSION: 1. No acute intracranial abnormality. No contrast-enhancing brain lesions. 2. Unchanged appearance of circumferential supraglottic laryngeal soft tissue thickening and confluent, masslike adenopathy at left level IIa compared to PET CT of 06/08/2016. 3. Thickening of the epiglottis is compatible with radiation treatment. 4. Please see report for dedicated chest CT for discussion of findings below the thoracic inlet. Electronically Signed: By: Ulyses Jarred M.D. On: 11/13/2016 18:50   Ct Soft Tissue Neck W Contrast  Addendum Date: 11/15/2016   ADDENDUM REPORT: 11/15/2016 15:09 ADDENDUM: I discussed the case with Dr. Logan Bores, who had reviewed the case with the clinical team at tumor board on 11/15/2016. On further review, the confluent nodal mass at left level 2 has decreased in size compared to the PET CT of 06/08/2016. Precise measurement is difficult due to the loss of fat planes between the mass and the adjacent muscle. At right level 3, there is a low-density lymph node measuring 12 mm, which is new from the prior study. There is a new low-density lymph node at right level 4, measuring 12 mm. Electronically Signed   By: Ulyses Jarred M.D.   On: 11/15/2016 15:09   Result Date: 11/15/2016 CLINICAL DATA:  Worsening throat pain and dysphagia. Supraglottic laryngeal cancer. Status post radiation and chemotherapy. EXAM: CT HEAD WITHOUT AND WITH CONTRAST CT NECK WITH CONTRAST  TECHNIQUE: Contiguous axial images were obtained from the base of the skull through the vertex without and with intravenous contrast. Multidetector CT imaging of the and neck was performed using the standard protocol following the bolus administration of intravenous contrast. CONTRAST:  116mL ISOVUE-300 IOPAMIDOL (ISOVUE-300) INJECTION 61% COMPARISON:  PET CT 06/08/2016 FINDINGS: CT HEAD FINDINGS Brain: No mass lesion, intraparenchymal hemorrhage or extra-axial collection. No evidence of acute cortical infarct. Brain parenchyma and CSF-containing spaces are normal for age. No contrast-enhancing lesions. Vascular: No hyperdense vessel or  unexpected calcification. Skull: Normal visualized skull base, calvarium and extracranial soft tissues. Sinuses/Orbits: No sinus fluid levels or advanced mucosal thickening. No mastoid effusion. Normal orbits. CT NECK FINDINGS Pharynx and larynx: --Nasopharynx: Fossae of Rosenmuller are clear. Normal adenoid tonsils for age. --Oral cavity and oropharynx: The palatine and lingual tonsils are normal. The visible oral cavity and floor of mouth are normal. --Hypopharynx: There is hypertrophy of the lingual tonsils. --Larynx: The epiglottis is thickened, compatible with history of radiation treatment. There is circumferential soft tissue thickening within the larynx. This is greatest at the posterior left aspect. --Retropharyngeal space: No abscess, effusion or lymphadenopathy. Salivary glands: --Parotid: No mass lesion or inflammation. No sialolithiasis or ductal dilatation. --Submandibular: Symmetric without inflammation. No sialolithiasis or ductal dilatation. --Sublingual: Normal. No ranula or other visible lesion of the base of tongue and floor of mouth. Thyroid: Normal. Lymph nodes: There is confluent lymphadenopathy at left level 2, encasing the proximal internal and external carotid arteries. This appears unchanged compared to the prior PET CT. Right level 5A node measures 9 mm.  Vascular: There is aortic atherosclerotic calcification. There is mixed calcified and noncalcified plaque within both common carotid arteries and at the carotid bifurcations. The there is an accessed right chest wall Port-A-Cath with its tip coursing beyond the field of view. Skeleton: No bony spinal canal stenosis. No lytic or blastic lesions. Upper chest: Please see dedicated report for chest CT. Other: None. IMPRESSION: 1. No acute intracranial abnormality. No contrast-enhancing brain lesions. 2. Unchanged appearance of circumferential supraglottic laryngeal soft tissue thickening and confluent, masslike adenopathy at left level IIa compared to PET CT of 06/08/2016. 3. Thickening of the epiglottis is compatible with radiation treatment. 4. Please see report for dedicated chest CT for discussion of findings below the thoracic inlet. Electronically Signed: By: Ulyses Jarred M.D. On: 11/13/2016 18:50   Ct Chest W Contrast  Result Date: 11/13/2016 CLINICAL DATA:  History of head and neck cancer with throat pain and dysphagia EXAM: CT CHEST WITH CONTRAST TECHNIQUE: Multidetector CT imaging of the chest was performed during intravenous contrast administration. CONTRAST:  156mL ISOVUE-300 IOPAMIDOL (ISOVUE-300) INJECTION 61% COMPARISON:  PET-CT 06/08/2016 FINDINGS: Cardiovascular: Nonaneurysmal aorta. Atherosclerotic vascular calcifications. Coronary artery calcification. Central venous catheter tip terminates at the cavoatrial junction. Normal heart size. No significant pericardial effusion. Mediastinum/Nodes: Since the previous PET-CT, development of multiple enlarged mediastinal lymph nodes. Right paratracheal lymph node measures 32 x 22 cm. AP window lymph node measures 16 mm. subcarinal lymph node measures 23 mm. A lymph node anterior to the pericardium measures 6 mm. Development of bilateral hilar adenopathy, measuring 15 mm on the right and 17 mm on the left. Midline trachea.  No thyroid mass.  Esophagus  within normal limits. Lungs/Pleura: Development of a small right-sided pleural effusion. Suspect increased size of a right lower lobe lung mass but increased consolidation in the right lower lobe makes assessment difficult. Mass measures approximately 2.6 cm, compared with 1.9 cm previously. Development of multiple bilateral ill-defined pulmonary nodules within the upper lobes and bilateral lower lobes. Patchy consolidation within the right lower lobe. Upper Abdomen: Subcentimeter hypodense liver foci too small to further characterize. Subcentimeter low-density lesion mid to upper left kidney. Musculoskeletal: No acute or suspicious bone lesions IMPRESSION: 1. Interim development of multiple enlarged mediastinal and hilar lymph nodes consistent with metastatic disease. 2. Development of multiple bilateral pulmonary nodules, also suspicious for metastatic disease. Suspect that the previously noted right lower lobe lung mass has increased in size, now measuring  about 26 mm compared with 19 mm previously. 3. New small right-sided pleural effusion. Patchy consolidations in the right lower lobe, uncertain if this represents pneumonia, aspiration, or metastatic nodules. 4. Subcentimeter hypodense foci in the liver, too small to further characterize. When the patient is clinically stable and able to follow directions and hold their breath (preferably as an outpatient) further evaluation with dedicated abdominal MRI may be considered. Aortic Atherosclerosis (ICD10-I70.0). Electronically Signed   By: Donavan Foil M.D.   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[place]) He tolerated recent chemotherapy well We will continue treatment without dose adjustment I plan minimum 2-3 months of treatment before repeat imaging  Hypercalcemia of malignancy His calcium level remained within normal limits Continue to observe closely  Protein-calorie malnutrition, moderate ([place]) He had recent weight  loss and moderate protein calorie malnutrition He will continue close follow-up with dietitian I encouraged him to increase oral supplement as tolerated  Other constipation He had recent constipation I recommend aggressive laxative therapy  Cancer associated pain He continues to have pain  due to disease He will continue IR morphine as instructed    No orders of the defined types were placed in this encounter.  All questions were answered. The patient knows to call the clinic with any problems, questions or concerns. No barriers to learning was detected. I spent 25 minutes counseling the patient face to face. The total time spent in the appointment was 30 minutes and more than 50% was on counseling and review of test results     [name], MD 11/29/2016 12:10 PM

## 2016-11-29 NOTE — Assessment & Plan Note (Signed)
He continues to have pain  due to disease He will continue IR morphine as instructed

## 2016-11-29 NOTE — Patient Instructions (Signed)
Implanted Port Home Guide An implanted port is a type of central line that is placed under the skin. Central lines are used to provide IV access when treatment or nutrition needs to be given through a person's veins. Implanted ports are used for long-term IV access. An implanted port may be placed because:  You need IV medicine that would be irritating to the small veins in your hands or arms.  You need long-term IV medicines, such as antibiotics.  You need IV nutrition for a long period.  You need frequent blood draws for lab tests.  You need dialysis.  Implanted ports are usually placed in the chest area, but they can also be placed in the upper arm, the abdomen, or the leg. An implanted port has two main parts:  Reservoir. The reservoir is round and will appear as a small, raised area under your skin. The reservoir is the part where a needle is inserted to give medicines or draw blood.  Catheter. The catheter is a thin, flexible tube that extends from the reservoir. The catheter is placed into a large vein. Medicine that is inserted into the reservoir goes into the catheter and then into the vein.  How will I care for my incision site? Do not get the incision site wet. Bathe or shower as directed by your health care provider. How is my port accessed? Special steps must be taken to access the port:  Before the port is accessed, a numbing cream can be placed on the skin. This helps numb the skin over the port site.  Your health care provider uses a sterile technique to access the port. ? Your health care provider must put on a mask and sterile gloves. ? The skin over your port is cleaned carefully with an antiseptic and allowed to dry. ? The port is gently pinched between sterile gloves, and a needle is inserted into the port.  Only "non-coring" port needles should be used to access the port. Once the port is accessed, a blood return should be checked. This helps ensure that the port  is in the vein and is not clogged.  If your port needs to remain accessed for a constant infusion, a clear (transparent) bandage will be placed over the needle site. The bandage and needle will need to be changed every week, or as directed by your health care provider.  Keep the bandage covering the needle clean and dry. Do not get it wet. Follow your health care provider's instructions on how to take a shower or bath while the port is accessed.  If your port does not need to stay accessed, no bandage is needed over the port.  What is flushing? Flushing helps keep the port from getting clogged. Follow your health care provider's instructions on how and when to flush the port. Ports are usually flushed with saline solution or a medicine called heparin. The need for flushing will depend on how the port is used.  If the port is used for intermittent medicines or blood draws, the port will need to be flushed: ? After medicines have been given. ? After blood has been drawn. ? As part of routine maintenance.  If a constant infusion is running, the port may not need to be flushed.  How long will my port stay implanted? The port can stay in for as long as your health care provider thinks it is needed. When it is time for the port to come out, surgery will be   done to remove it. The procedure is similar to the one performed when the port was put in. When should I seek immediate medical care? When you have an implanted port, you should seek immediate medical care if:  You notice a bad smell coming from the incision site.  You have swelling, redness, or drainage at the incision site.  You have more swelling or pain at the port site or the surrounding area.  You have a fever that is not controlled with medicine.  This information is not intended to replace advice given to you by your health care provider. Make sure you discuss any questions you have with your health care provider. Document  Released: 01/30/2005 Document Revised: 07/08/2015 Document Reviewed: 10/07/2012 Elsevier Interactive Patient Education  2017 [employer]  

## 2016-11-29 NOTE — Assessment & Plan Note (Signed)
He had recent constipation I recommend aggressive laxative therapy

## 2016-11-29 NOTE — Assessment & Plan Note (Signed)
He tolerated recent chemotherapy well We will continue treatment without dose adjustment I plan minimum 2-3 months of treatment before repeat imaging

## 2016-11-29 NOTE — Assessment & Plan Note (Signed)
He had recent weight loss and moderate protein calorie malnutrition He will continue close follow-up with dietitian I encouraged him to increase oral supplement as tolerated

## 2016-11-30 ENCOUNTER — Telehealth: Payer: Self-pay

## 2016-11-30 ENCOUNTER — Ambulatory Visit: Payer: Self-pay

## 2016-11-30 DIAGNOSIS — C781 Secondary malignant neoplasm of mediastinum: Secondary | ICD-10-CM | POA: Diagnosis not present

## 2016-11-30 DIAGNOSIS — J449 Chronic obstructive pulmonary disease, unspecified: Secondary | ICD-10-CM | POA: Diagnosis not present

## 2016-11-30 DIAGNOSIS — C321 Malignant neoplasm of supraglottis: Secondary | ICD-10-CM | POA: Diagnosis not present

## 2016-11-30 DIAGNOSIS — M170 Bilateral primary osteoarthritis of knee: Secondary | ICD-10-CM | POA: Diagnosis not present

## 2016-11-30 DIAGNOSIS — Z87891 Personal history of nicotine dependence: Secondary | ICD-10-CM | POA: Diagnosis not present

## 2016-11-30 DIAGNOSIS — Z431 Encounter for attention to gastrostomy: Secondary | ICD-10-CM | POA: Diagnosis not present

## 2016-11-30 DIAGNOSIS — D630 Anemia in neoplastic disease: Secondary | ICD-10-CM | POA: Diagnosis not present

## 2016-11-30 DIAGNOSIS — E43 Unspecified severe protein-calorie malnutrition: Secondary | ICD-10-CM | POA: Diagnosis not present

## 2016-11-30 DIAGNOSIS — Z79899 Other long term (current) drug therapy: Secondary | ICD-10-CM | POA: Diagnosis not present

## 2016-11-30 DIAGNOSIS — R1310 Dysphagia, unspecified: Secondary | ICD-10-CM | POA: Diagnosis not present

## 2016-11-30 DIAGNOSIS — G893 Neoplasm related pain (acute) (chronic): Secondary | ICD-10-CM | POA: Diagnosis not present

## 2016-11-30 LAB — PHOSPHORUS: Phosphorus, Ser: 2.6 mg/dL (ref 2.5–4.5)

## 2016-11-30 NOTE — Telephone Encounter (Signed)
I am not concerned Could be due to mild dehydration No new orders

## 2016-11-30 NOTE — Telephone Encounter (Signed)
Jill with [facility] called with pt HR 120, BP 122/80, pt was asymptomatic but  seemed a little anxious. No nausea, no vomiting. Was handling his tube feeds/ fluids well.  Taxol carbo 10/17 Next OV lab/ flush/ inf on 10/24  Call Jill at [phone] if any orders.

## 2016-12-01 ENCOUNTER — Other Ambulatory Visit: Payer: Self-pay

## 2016-12-01 NOTE — Patient Outreach (Signed)
[facility]) Care Management  12/01/16  Antonio [name] 10-08-1955 [id number]  Successful outreach completed with patient. Patient identification verified.  Patient currently denies any questions or concerns. Stated that he cannot talk right now because he is riding with his sister while she is out paying bills. He has no complaints.  RNCM advised will follow up again next week and patient verbalized understanding and agreement with plan. Encouraged him to call RNCM with any needs/concerns.  [place] R. Lisha Vitale, RN, BSN, [place] Management Coordinator [phone]

## 2016-12-04 ENCOUNTER — Emergency Department (HOSPITAL_COMMUNITY): Payer: Medicare Other

## 2016-12-04 ENCOUNTER — Other Ambulatory Visit: Payer: Self-pay

## 2016-12-04 ENCOUNTER — Telehealth: Payer: Self-pay

## 2016-12-04 ENCOUNTER — Inpatient Hospital Stay (HOSPITAL_COMMUNITY)
Admission: EM | Admit: 2016-12-04 | Discharge: 2017-01-13 | DRG: 314 | Disposition: E | Payer: Medicare Other | Attending: Internal Medicine | Admitting: Internal Medicine

## 2016-12-04 ENCOUNTER — Encounter (HOSPITAL_COMMUNITY): Payer: Self-pay

## 2016-12-04 DIAGNOSIS — D638 Anemia in other chronic diseases classified elsewhere: Secondary | ICD-10-CM | POA: Diagnosis present

## 2016-12-04 DIAGNOSIS — G629 Polyneuropathy, unspecified: Secondary | ICD-10-CM | POA: Diagnosis not present

## 2016-12-04 DIAGNOSIS — C321 Malignant neoplasm of supraglottis: Secondary | ICD-10-CM | POA: Diagnosis not present

## 2016-12-04 DIAGNOSIS — E440 Moderate protein-calorie malnutrition: Secondary | ICD-10-CM | POA: Diagnosis not present

## 2016-12-04 DIAGNOSIS — D6959 Other secondary thrombocytopenia: Secondary | ICD-10-CM | POA: Diagnosis not present

## 2016-12-04 DIAGNOSIS — R1310 Dysphagia, unspecified: Secondary | ICD-10-CM

## 2016-12-04 DIAGNOSIS — Z4589 Encounter for adjustment and management of other implanted devices: Secondary | ICD-10-CM | POA: Diagnosis not present

## 2016-12-04 DIAGNOSIS — E876 Hypokalemia: Secondary | ICD-10-CM | POA: Diagnosis present

## 2016-12-04 DIAGNOSIS — D6481 Anemia due to antineoplastic chemotherapy: Secondary | ICD-10-CM | POA: Diagnosis not present

## 2016-12-04 DIAGNOSIS — G931 Anoxic brain damage, not elsewhere classified: Secondary | ICD-10-CM

## 2016-12-04 DIAGNOSIS — G893 Neoplasm related pain (acute) (chronic): Secondary | ICD-10-CM | POA: Diagnosis not present

## 2016-12-04 DIAGNOSIS — S062X9D Diffuse traumatic brain injury with loss of consciousness of unspecified duration, subsequent encounter: Secondary | ICD-10-CM | POA: Diagnosis not present

## 2016-12-04 DIAGNOSIS — D61810 Antineoplastic chemotherapy induced pancytopenia: Secondary | ICD-10-CM | POA: Diagnosis not present

## 2016-12-04 DIAGNOSIS — D72829 Elevated white blood cell count, unspecified: Secondary | ICD-10-CM | POA: Diagnosis not present

## 2016-12-04 DIAGNOSIS — Z800 Family history of malignant neoplasm of digestive organs: Secondary | ICD-10-CM

## 2016-12-04 DIAGNOSIS — T82898A Other specified complication of vascular prosthetic devices, implants and grafts, initial encounter: Secondary | ICD-10-CM | POA: Diagnosis not present

## 2016-12-04 DIAGNOSIS — J9601 Acute respiratory failure with hypoxia: Secondary | ICD-10-CM | POA: Diagnosis not present

## 2016-12-04 DIAGNOSIS — J440 Chronic obstructive pulmonary disease with acute lower respiratory infection: Secondary | ICD-10-CM | POA: Diagnosis not present

## 2016-12-04 DIAGNOSIS — F1210 Cannabis abuse, uncomplicated: Secondary | ICD-10-CM | POA: Diagnosis present

## 2016-12-04 DIAGNOSIS — J189 Pneumonia, unspecified organism: Secondary | ICD-10-CM | POA: Diagnosis not present

## 2016-12-04 DIAGNOSIS — Z8521 Personal history of malignant neoplasm of larynx: Secondary | ICD-10-CM

## 2016-12-04 DIAGNOSIS — R7881 Bacteremia: Secondary | ICD-10-CM

## 2016-12-04 DIAGNOSIS — M21162 Varus deformity, not elsewhere classified, left knee: Secondary | ICD-10-CM | POA: Diagnosis present

## 2016-12-04 DIAGNOSIS — L02213 Cutaneous abscess of chest wall: Secondary | ICD-10-CM

## 2016-12-04 DIAGNOSIS — D573 Sickle-cell trait: Secondary | ICD-10-CM | POA: Diagnosis present

## 2016-12-04 DIAGNOSIS — R1319 Other dysphagia: Secondary | ICD-10-CM | POA: Diagnosis not present

## 2016-12-04 DIAGNOSIS — T451X5A Adverse effect of antineoplastic and immunosuppressive drugs, initial encounter: Secondary | ICD-10-CM | POA: Diagnosis not present

## 2016-12-04 DIAGNOSIS — D61818 Other pancytopenia: Secondary | ICD-10-CM | POA: Diagnosis present

## 2016-12-04 DIAGNOSIS — R531 Weakness: Secondary | ICD-10-CM | POA: Diagnosis not present

## 2016-12-04 DIAGNOSIS — Z931 Gastrostomy status: Secondary | ICD-10-CM

## 2016-12-04 DIAGNOSIS — J90 Pleural effusion, not elsewhere classified: Secondary | ICD-10-CM

## 2016-12-04 DIAGNOSIS — C3431 Malignant neoplasm of lower lobe, right bronchus or lung: Secondary | ICD-10-CM | POA: Diagnosis not present

## 2016-12-04 DIAGNOSIS — E860 Dehydration: Secondary | ICD-10-CM | POA: Diagnosis not present

## 2016-12-04 DIAGNOSIS — R509 Fever, unspecified: Secondary | ICD-10-CM | POA: Diagnosis not present

## 2016-12-04 DIAGNOSIS — C787 Secondary malignant neoplasm of liver and intrahepatic bile duct: Secondary | ICD-10-CM | POA: Diagnosis present

## 2016-12-04 DIAGNOSIS — R296 Repeated falls: Secondary | ICD-10-CM | POA: Diagnosis present

## 2016-12-04 DIAGNOSIS — T884XXA Failed or difficult intubation, initial encounter: Secondary | ICD-10-CM | POA: Diagnosis not present

## 2016-12-04 DIAGNOSIS — Z923 Personal history of irradiation: Secondary | ICD-10-CM

## 2016-12-04 DIAGNOSIS — C384 Malignant neoplasm of pleura: Secondary | ICD-10-CM | POA: Diagnosis not present

## 2016-12-04 DIAGNOSIS — W19XXXA Unspecified fall, initial encounter: Secondary | ICD-10-CM

## 2016-12-04 DIAGNOSIS — C770 Secondary and unspecified malignant neoplasm of lymph nodes of head, face and neck: Secondary | ICD-10-CM | POA: Diagnosis present

## 2016-12-04 DIAGNOSIS — A419 Sepsis, unspecified organism: Secondary | ICD-10-CM | POA: Diagnosis present

## 2016-12-04 DIAGNOSIS — J910 Malignant pleural effusion: Secondary | ICD-10-CM | POA: Diagnosis not present

## 2016-12-04 DIAGNOSIS — T80211D Bloodstream infection due to central venous catheter, subsequent encounter: Secondary | ICD-10-CM | POA: Diagnosis not present

## 2016-12-04 DIAGNOSIS — Z9889 Other specified postprocedural states: Secondary | ICD-10-CM

## 2016-12-04 DIAGNOSIS — K219 Gastro-esophageal reflux disease without esophagitis: Secondary | ICD-10-CM | POA: Diagnosis present

## 2016-12-04 DIAGNOSIS — T80212D Local infection due to central venous catheter, subsequent encounter: Secondary | ICD-10-CM | POA: Diagnosis not present

## 2016-12-04 DIAGNOSIS — Z452 Encounter for adjustment and management of vascular access device: Secondary | ICD-10-CM | POA: Diagnosis not present

## 2016-12-04 DIAGNOSIS — E46 Unspecified protein-calorie malnutrition: Secondary | ICD-10-CM | POA: Diagnosis not present

## 2016-12-04 DIAGNOSIS — I10 Essential (primary) hypertension: Secondary | ICD-10-CM | POA: Diagnosis present

## 2016-12-04 DIAGNOSIS — M545 Low back pain: Secondary | ICD-10-CM | POA: Diagnosis not present

## 2016-12-04 DIAGNOSIS — B9561 Methicillin susceptible Staphylococcus aureus infection as the cause of diseases classified elsewhere: Secondary | ICD-10-CM | POA: Diagnosis present

## 2016-12-04 DIAGNOSIS — Y848 Other medical procedures as the cause of abnormal reaction of the patient, or of later complication, without mention of misadventure at the time of the procedure: Secondary | ICD-10-CM | POA: Diagnosis not present

## 2016-12-04 DIAGNOSIS — J15211 Pneumonia due to Methicillin susceptible Staphylococcus aureus: Secondary | ICD-10-CM | POA: Diagnosis not present

## 2016-12-04 DIAGNOSIS — I5030 Unspecified diastolic (congestive) heart failure: Secondary | ICD-10-CM | POA: Diagnosis not present

## 2016-12-04 DIAGNOSIS — Z6821 Body mass index (BMI) 21.0-21.9, adult: Secondary | ICD-10-CM | POA: Diagnosis not present

## 2016-12-04 DIAGNOSIS — M170 Bilateral primary osteoarthritis of knee: Secondary | ICD-10-CM | POA: Diagnosis present

## 2016-12-04 DIAGNOSIS — R9431 Abnormal electrocardiogram [ECG] [EKG]: Secondary | ICD-10-CM | POA: Diagnosis not present

## 2016-12-04 DIAGNOSIS — L0291 Cutaneous abscess, unspecified: Secondary | ICD-10-CM

## 2016-12-04 DIAGNOSIS — F1910 Other psychoactive substance abuse, uncomplicated: Secondary | ICD-10-CM | POA: Diagnosis not present

## 2016-12-04 DIAGNOSIS — C782 Secondary malignant neoplasm of pleura: Secondary | ICD-10-CM | POA: Diagnosis not present

## 2016-12-04 DIAGNOSIS — R000 Tachycardia, unspecified: Secondary | ICD-10-CM | POA: Diagnosis not present

## 2016-12-04 DIAGNOSIS — W010XXA Fall on same level from slipping, tripping and stumbling without subsequent striking against object, initial encounter: Secondary | ICD-10-CM | POA: Diagnosis present

## 2016-12-04 DIAGNOSIS — Z66 Do not resuscitate: Secondary | ICD-10-CM | POA: Diagnosis not present

## 2016-12-04 DIAGNOSIS — M542 Cervicalgia: Secondary | ICD-10-CM | POA: Diagnosis present

## 2016-12-04 DIAGNOSIS — R0602 Shortness of breath: Secondary | ICD-10-CM | POA: Diagnosis not present

## 2016-12-04 DIAGNOSIS — R6521 Severe sepsis with septic shock: Secondary | ICD-10-CM | POA: Diagnosis present

## 2016-12-04 DIAGNOSIS — J151 Pneumonia due to Pseudomonas: Secondary | ICD-10-CM | POA: Diagnosis not present

## 2016-12-04 DIAGNOSIS — E43 Unspecified severe protein-calorie malnutrition: Secondary | ICD-10-CM | POA: Diagnosis not present

## 2016-12-04 DIAGNOSIS — Z515 Encounter for palliative care: Secondary | ICD-10-CM | POA: Diagnosis not present

## 2016-12-04 DIAGNOSIS — Z87891 Personal history of nicotine dependence: Secondary | ICD-10-CM

## 2016-12-04 DIAGNOSIS — Y92009 Unspecified place in unspecified non-institutional (private) residence as the place of occurrence of the external cause: Secondary | ICD-10-CM

## 2016-12-04 DIAGNOSIS — I469 Cardiac arrest, cause unspecified: Secondary | ICD-10-CM | POA: Diagnosis not present

## 2016-12-04 DIAGNOSIS — A4101 Sepsis due to Methicillin susceptible Staphylococcus aureus: Secondary | ICD-10-CM | POA: Diagnosis not present

## 2016-12-04 DIAGNOSIS — C771 Secondary and unspecified malignant neoplasm of intrathoracic lymph nodes: Secondary | ICD-10-CM | POA: Diagnosis not present

## 2016-12-04 DIAGNOSIS — C7951 Secondary malignant neoplasm of bone: Secondary | ICD-10-CM | POA: Diagnosis not present

## 2016-12-04 DIAGNOSIS — R52 Pain, unspecified: Secondary | ICD-10-CM | POA: Diagnosis not present

## 2016-12-04 DIAGNOSIS — M21161 Varus deformity, not elsewhere classified, right knee: Secondary | ICD-10-CM | POA: Diagnosis present

## 2016-12-04 DIAGNOSIS — T80211A Bloodstream infection due to central venous catheter, initial encounter: Secondary | ICD-10-CM | POA: Diagnosis not present

## 2016-12-04 DIAGNOSIS — I468 Cardiac arrest due to other underlying condition: Secondary | ICD-10-CM | POA: Diagnosis not present

## 2016-12-04 DIAGNOSIS — J9811 Atelectasis: Secondary | ICD-10-CM | POA: Diagnosis not present

## 2016-12-04 DIAGNOSIS — C7800 Secondary malignant neoplasm of unspecified lung: Secondary | ICD-10-CM | POA: Diagnosis present

## 2016-12-04 DIAGNOSIS — R4020 Unspecified coma: Secondary | ICD-10-CM | POA: Diagnosis not present

## 2016-12-04 DIAGNOSIS — G253 Myoclonus: Secondary | ICD-10-CM | POA: Diagnosis not present

## 2016-12-04 DIAGNOSIS — R6520 Severe sepsis without septic shock: Secondary | ICD-10-CM | POA: Diagnosis not present

## 2016-12-04 DIAGNOSIS — I959 Hypotension, unspecified: Secondary | ICD-10-CM | POA: Diagnosis not present

## 2016-12-04 DIAGNOSIS — R569 Unspecified convulsions: Secondary | ICD-10-CM | POA: Diagnosis not present

## 2016-12-04 DIAGNOSIS — R091 Pleurisy: Secondary | ICD-10-CM | POA: Diagnosis not present

## 2016-12-04 DIAGNOSIS — F1410 Cocaine abuse, uncomplicated: Secondary | ICD-10-CM | POA: Diagnosis present

## 2016-12-04 DIAGNOSIS — Z9181 History of falling: Secondary | ICD-10-CM | POA: Diagnosis not present

## 2016-12-04 DIAGNOSIS — Z7189 Other specified counseling: Secondary | ICD-10-CM | POA: Diagnosis not present

## 2016-12-04 DIAGNOSIS — I361 Nonrheumatic tricuspid (valve) insufficiency: Secondary | ICD-10-CM | POA: Diagnosis not present

## 2016-12-04 LAB — I-STAT CG4 LACTIC ACID, ED
LACTIC ACID, VENOUS: 1.58 mmol/L (ref 0.5–1.9)
LACTIC ACID, VENOUS: 1.7 mmol/L (ref 0.5–1.9)
LACTIC ACID, VENOUS: 1.92 mmol/L — AB (ref 0.5–1.9)

## 2016-12-04 LAB — CBC WITH DIFFERENTIAL/PLATELET
BASOS ABS: 0 10*3/uL (ref 0.0–0.1)
BASOS PCT: 0 %
Eosinophils Absolute: 0 10*3/uL (ref 0.0–0.7)
Eosinophils Relative: 0 %
HEMATOCRIT: 23.5 % — AB (ref 39.0–52.0)
HEMOGLOBIN: 8.2 g/dL — AB (ref 13.0–17.0)
LYMPHS PCT: 6 %
Lymphs Abs: 0.2 10*3/uL — ABNORMAL LOW (ref 0.7–4.0)
MCH: 26.8 pg (ref 26.0–34.0)
MCHC: 34.9 g/dL (ref 30.0–36.0)
MCV: 76.8 fL — ABNORMAL LOW (ref 78.0–100.0)
MONOS PCT: 8 %
Monocytes Absolute: 0.3 10*3/uL (ref 0.1–1.0)
NEUTROS ABS: 3.2 10*3/uL (ref 1.7–7.7)
NEUTROS PCT: 86 %
Platelets: 184 10*3/uL (ref 150–400)
RBC: 3.06 MIL/uL — ABNORMAL LOW (ref 4.22–5.81)
RDW: 16.4 % — ABNORMAL HIGH (ref 11.5–15.5)
WBC: 3.7 10*3/uL — ABNORMAL LOW (ref 4.0–10.5)

## 2016-12-04 LAB — COMPREHENSIVE METABOLIC PANEL
ALT: 57 U/L (ref 17–63)
AST: 38 U/L (ref 15–41)
Albumin: 2.5 g/dL — ABNORMAL LOW (ref 3.5–5.0)
Alkaline Phosphatase: 91 U/L (ref 38–126)
Anion gap: 10 (ref 5–15)
BILIRUBIN TOTAL: 1.3 mg/dL — AB (ref 0.3–1.2)
BUN: 23 mg/dL — AB (ref 6–20)
CALCIUM: 8.6 mg/dL — AB (ref 8.9–10.3)
CO2: 29 mmol/L (ref 22–32)
CREATININE: 1 mg/dL (ref 0.61–1.24)
Chloride: 98 mmol/L — ABNORMAL LOW (ref 101–111)
GFR calc Af Amer: >60 mL/min (ref >60)
Glucose, Bld: 135 mg/dL — ABNORMAL HIGH (ref 65–99)
Potassium: 3.7 mmol/L (ref 3.5–5.1)
Sodium: 137 mmol/L (ref 135–145)
TOTAL PROTEIN: 6.3 g/dL — AB (ref 6.5–8.1)

## 2016-12-04 LAB — URINALYSIS, ROUTINE W REFLEX MICROSCOPIC
Bilirubin Urine: NEGATIVE
GLUCOSE, UA: NEGATIVE mg/dL
Hgb urine dipstick: NEGATIVE
KETONES UR: NEGATIVE mg/dL
LEUKOCYTES UA: NEGATIVE
NITRITE: NEGATIVE
PROTEIN: NEGATIVE mg/dL
Specific Gravity, Urine: 1.013 (ref 1.005–1.030)
pH: 5 (ref 5.0–8.0)

## 2016-12-04 LAB — PROTIME-INR
INR: 1.29
PROTHROMBIN TIME: 16 s — AB (ref 11.4–15.2)

## 2016-12-04 LAB — TROPONIN I: TROPONIN I: 0.03 ng/mL — AB (ref <0.03)

## 2016-12-04 MED ORDER — MORPHINE SULFATE 15 MG PO TABS
15.0000 mg | ORAL_TABLET | ORAL | Status: DC | PRN
  Administered 2016-12-05 – 2016-12-18 (×31): 15 mg via ORAL
  Filled 2016-12-04 (×31): qty 1

## 2016-12-04 MED ORDER — FREE WATER
100.0000 mL | Status: DC
  Administered 2016-12-05 – 2016-12-14 (×53): 100 mL

## 2016-12-04 MED ORDER — ENOXAPARIN SODIUM 40 MG/0.4ML ~~LOC~~ SOLN
40.0000 mg | SUBCUTANEOUS | Status: DC
  Administered 2016-12-05 – 2016-12-22 (×17): 40 mg via SUBCUTANEOUS
  Filled 2016-12-04 (×18): qty 0.4

## 2016-12-04 MED ORDER — ACETAMINOPHEN 325 MG PO TABS
650.0000 mg | ORAL_TABLET | Freq: Four times a day (QID) | ORAL | Status: DC | PRN
Start: 2016-12-04 — End: 2016-12-05
  Administered 2016-12-05: 650 mg via ORAL
  Filled 2016-12-04: qty 2

## 2016-12-04 MED ORDER — SODIUM CHLORIDE 0.9 % IV BOLUS (SEPSIS)
1000.0000 mL | Freq: Once | INTRAVENOUS | Status: AC
  Administered 2016-12-04: 1000 mL via INTRAVENOUS

## 2016-12-04 MED ORDER — MORPHINE SULFATE (PF) 4 MG/ML IV SOLN
6.0000 mg | Freq: Once | INTRAVENOUS | Status: AC
  Administered 2016-12-04: 6 mg via INTRAVENOUS
  Filled 2016-12-04: qty 2

## 2016-12-04 MED ORDER — ONDANSETRON HCL 4 MG/2ML IJ SOLN: 4.0000 mg | Freq: Four times a day (QID) | INTRAMUSCULAR | Status: DC | PRN

## 2016-12-04 MED ORDER — SENNOSIDES-DOCUSATE SODIUM 8.6-50 MG PO TABS
1.0000 | ORAL_TABLET | Freq: Two times a day (BID) | ORAL | Status: DC
  Administered 2016-12-05 – 2016-12-18 (×22): 1 via ORAL
  Filled 2016-12-04 (×25): qty 1

## 2016-12-04 MED ORDER — VANCOMYCIN HCL IN DEXTROSE 1-5 GM/200ML-% IV SOLN
1000.0000 mg | Freq: Once | INTRAVENOUS | Status: AC
  Administered 2016-12-04: 1000 mg via INTRAVENOUS
  Filled 2016-12-04: qty 200

## 2016-12-04 MED ORDER — ACETAMINOPHEN 650 MG RE SUPP: 650.0000 mg | Freq: Four times a day (QID) | RECTAL | Status: DC | PRN

## 2016-12-04 MED ORDER — LIDOCAINE-PRILOCAINE 2.5-2.5 % EX CREA: TOPICAL_CREAM | CUTANEOUS | Status: DC | PRN

## 2016-12-04 MED ORDER — ACETAMINOPHEN 325 MG PO TABS
650.0000 mg | ORAL_TABLET | Freq: Once | ORAL | Status: AC
  Administered 2016-12-04: 650 mg via ORAL
  Filled 2016-12-04: qty 2

## 2016-12-04 MED ORDER — IBUPROFEN 800 MG PO TABS
800.0000 mg | ORAL_TABLET | Freq: Once | ORAL | Status: AC
  Administered 2016-12-04: 800 mg via ORAL
  Filled 2016-12-04: qty 1

## 2016-12-04 MED ORDER — ONDANSETRON HCL 4 MG PO TABS: 4.0000 mg | ORAL_TABLET | Freq: Four times a day (QID) | ORAL | Status: DC | PRN

## 2016-12-04 MED ORDER — GABAPENTIN 300 MG PO CAPS
300.0000 mg | ORAL_CAPSULE | Freq: Three times a day (TID) | ORAL | Status: DC
  Filled 2016-12-04: qty 1

## 2016-12-04 MED ORDER — PIPERACILLIN-TAZOBACTAM 3.375 G IVPB 30 MIN
3.3750 g | Freq: Once | INTRAVENOUS | Status: AC
  Administered 2016-12-04: 3.375 g via INTRAVENOUS
  Filled 2016-12-04: qty 50

## 2016-12-04 MED ORDER — SODIUM CHLORIDE 0.9 % IV SOLN
INTRAVENOUS | Status: DC
  Administered 2016-12-04 – 2016-12-06 (×5): via INTRAVENOUS

## 2016-12-04 NOTE — ED Notes (Signed)
Bed: MD47 Expected date:  Expected time:  Means of arrival:  Comments: EMS-pain

## 2016-12-04 NOTE — ED Triage Notes (Signed)
Pt family called EMS for generalized pain and throat pain. Presents in Sinus Tach and febrile at 103.1

## 2016-12-04 NOTE — ED Notes (Signed)
Asked for urine, pt made aware

## 2016-12-04 NOTE — H&P (Addendum)
History and Physical    Azzan Butler DVV:[id number] DOB: Aug 22, 1955 DOA: 11/26/2016  Referring MD/NP/PA: Dr. Dorise Bullion PCP: Maryellen Pile, MD  Patient coming from: home via EMS  Chief Complaint: Generalized body aches  HPI: Antonio [name] is a 61 y.o. male with medical history significant of supraglottic cancer(s/p resection, chemotherapy, radiation,and PEG followed by [name]), COPD, sickle cell trait; who presents with complaints of generalized body aches. Patient is somewhat of a poor historian and reports that his sister made him come here. Symptoms have been progressively worse over the last few days. He reports slipping and falling hitting his head yesterday, but denies any loss of consciousness. Most notably he complains of neck and leg pain. He has not tried anything other than his home medications to treat symptoms. Associated symptoms include reports having chills and a productive cough. He denies complaints of any fever, but reports that his sister has had similar symptoms of cough. Patient had just been admitted earlier in the month for hypercalcemia secondary to malignancy.  ED Course: On admission into the emergency department patient was seen to be febrile up to 103.69F, heart rates 134-141, respirations 18-32, blood pressures 117/57-122/66, O2 saturations 91-96% on room air. Labs revealed WBC 3.7, hemoglobin 8.2, MCV 76.8, RDW 16.4, platelets 184, BUN 23, creatinine 1, albumin 2.5, lactic acid trending upwards to 1.92. Chest x-ray showed right base collapse with small pleural effusion. Urinalysis was negative for any signs of infection. Patient code sepsis was initiated. Patient ordered to be given 3 L of normal saline IV fluids and empiric antibiotics of Zosyn and vancomycin. Influenza and strep screen pending.    Review of Systems  Constitutional: Positive for chills and malaise/fatigue.  HENT: Negative for ear discharge and nosebleeds.   Eyes: Negative for pain and  redness.  Respiratory: Positive for cough and sputum production.   Cardiovascular: Positive for chest pain. Negative for leg swelling.  Gastrointestinal: Negative for abdominal pain and vomiting.  Genitourinary: Negative for dysuria and frequency.  Musculoskeletal: Positive for falls and myalgias.  Skin: Negative for itching and rash.  Neurological: Positive for weakness. Negative for sensory change and loss of consciousness.  Endo/Heme/Allergies: Negative for polydipsia. Does not bruise/bleed easily.  Psychiatric/Behavioral: Negative for memory loss and substance abuse.    Past Medical History:  Diagnosis Date  . Allergy    allergic rhinitis  . Arthritis    "qwhere" (05/17/2016)  . Chronic cough   . Chronic sinus infection   . Colonic polyp   . COPD (chronic obstructive pulmonary disease) ([place])   . 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 Dysphagia    normal EGD except for mild gastritis in 01/2006  . GERD (gastroesophageal reflux disease)   . Head and neck malignancy ([place]) 05/18/2016  . High cholesterol   . History of radiation therapy 06/28/16- 08/17/16   Larynx 70 Gy in 35 fractions to gross disease, 63 Gy in 35 fractions to high risk nodal echelons, and 56 Gy in 35 fractions to intermediate risk nodal echelons  . History of radiation therapy 06/28/16- 08/17/16   Right Lung 60 Gy in 5 fractions--- given at same time as Larynx radiation  . Hypertension   . Rectal bleeding    colonoscopy in 01/2006  . Rectal polyp    01/2006 with path showing it to be benign, though with a question that it was incomplete biopsy, next scheduled coloscopy 2010  . Sickle cell trait ([place])   .  Tobacco abuse   . Varus deformity of knee    bilateral, congenital    Past Surgical History:  Procedure Laterality Date  . COLONOSCOPY W/ BIOPSIES AND POLYPECTOMY  X 2  . IR FLUORO GUIDE PORT INSERTION RIGHT  06/23/2016  . IR GASTROSTOMY TUBE MOD SED  06/23/2016  . IR US GUIDE VASC ACCESS  RIGHT  06/23/2016  . MULTIPLE TOOTH EXTRACTIONS  ~ 2008   "took them all out"     reports that he has quit smoking. His smoking use included Cigarettes. He has a 5.16 pack-year smoking history. He has never used smokeless tobacco. He reports that he drinks about 14.4 oz of alcohol per week . He reports that he uses drugs, including "Crack" cocaine and Marijuana.  No Known Allergies  Family History  Problem Relation Age of Onset  . Cancer Mother        gastric ca  . Hypertension Father   . Diabetes Father   . Diabetes Sister   . CAD Sister     Prior to Admission medications   Medication Sig Start Date End Date Taking? Authorizing Provider  gabapentin (NEURONTIN) 300 MG capsule Take 1 capsule (300 mg total) by mouth 3 (three) times daily. 08/24/16  Yes Gorsuch, Ni, MD  lidocaine-prilocaine (EMLA) cream Apply to affected area once 06/14/16  Yes Gorsuch, Ni, MD  morphine (MSIR) 15 MG tablet Take 1 tablet (15 mg total) by mouth every 4 (four) hours as needed for severe pain. 11/23/16  Yes [name], MD  Nutritional Supplements (FEEDING SUPPLEMENT, OSMOLITE 1.5 CAL,) LIQD Begin Osmolite 1.5 via PEG TID with 60 mL free water. Advance slowly to  goal of 2 bottles QID with 60 mL free water before and after. In addition, drink or flush PEG with 240 mL free water TID. 11/19/16  Yes Florencia Reasons, MD  senna-docusate (SENOKOT-S) 8.6-50 MG tablet Take 1 tablet by mouth 2 (two) times daily. 06/14/16  Yes [name], MD  Water For Irrigation, Sterile (FREE WATER) SOLN Place 100 mLs into feeding tube every 4 (four) hours. 11/19/16  Yes Florencia Reasons, MD  ondansetron (ZOFRAN) 8 MG tablet Take 1 tablet (8 mg total) by mouth 2 (two) times daily as needed. Start on the third day after chemotherapy. Patient not taking: Reported on 10/18/2016 06/14/16   [name], MD    Physical Exam:  Constitutional: Older male who appears acutely ill Vitals:   12/13/2016 2023 11/25/2016 2129 12/01/2016 2200 12/12/2016 2212  BP:  119/70 (!)  117/57 122/66  Pulse:  (!) 139 (!) 137 (!) 134  Resp:  (!) 24 (!) 32 (!) 24  Temp:  (!) 102.8 F (39.3 C)    TempSrc:  Oral    SpO2:  95% 96% 91%  Weight: 58.1 kg (128 lb)     Height: 5\' 3"  (1.6 m)      Eyes: PERRL, lids and conjunctivae normal ENMT: Mucous membranes are moist. Posterior pharynx clear of any exudate or lesions.  Neck: normal, supple, no masses, no thyromegaly Respiratory: Tachypneic with positive rhonchi noted on right lower field.  Cardiovascular: Tachycardic, no murmurs / rubs / gallops. No extremity edema. 2+ pedal pulses. No carotid bruits.  Abdomen: no tenderness, no masses palpated. No hepatosplenomegaly. Bowel sounds positive.  Musculoskeletal: no clubbing / cyanosis. No joint deformity upper and lower extremities. Good ROM, no contractures. Normal muscle tone.  Skin: no rashes, lesions, ulcers. No induration. Diaphoretic. Neurologic: CN 2-12 grossly intact. Sensation intact, DTR normal. Strength 5/5  in all 4.  Psychiatric: Normal judgment and insight. Alert and oriented x 3. Normal mood.     Labs on Admission: I have personally reviewed following labs and imaging studies  CBC:  Recent Labs Lab 11/29/16 1038 11/27/2016 1837  WBC 9.4 3.7*  NEUTROABS 8.1* 3.2  HGB 8.9* 8.2*  HCT 27.0* 23.5*  MCV 80.8 76.8*  PLT 227 885   Basic Metabolic Panel:  Recent Labs Lab 11/29/16 1037 11/29/16 1038 11/19/2016 1837  NA 139  --  137  K 3.7  --  3.7  CL  --   --  98*  CO2 28  --  29  GLUCOSE 160*  --  135*  BUN 18.4  --  23*  CREATININE 0.9  --  1.00  CALCIUM 9.6  --  8.6*  MG 2.3  --   --   PHOS  --  2.6  --    GFR: Estimated Creatinine Clearance: 63.2 mL/min (by C-G formula based on SCr of 1 mg/dL). Liver Function Tests:  Recent Labs Lab 11/29/16 1037 11/24/2016 1837  AST 32 38  ALT 50 57  ALKPHOS 90 91  BILITOT 0.82 1.3*  PROT 6.6 6.3*  ALBUMIN 2.7* 2.5*   No results for input(s): LIPASE, AMYLASE in the last 168 hours. No results for  input(s): AMMONIA in the last 168 hours. Coagulation Profile:  Recent Labs Lab 11/19/2016 1837  INR 1.29   Cardiac Enzymes:  Recent Labs Lab 12/08/2016 1924  TROPONINI 0.03*   BNP (last 3 results) No results for input(s): PROBNP in the last 8760 hours. HbA1C: No results for input(s): HGBA1C in the last 72 hours. CBG: No results for input(s): GLUCAP in the last 168 hours. Lipid Profile: No results for input(s): CHOL, HDL, LDLCALC, TRIG, CHOLHDL, LDLDIRECT in the last 72 hours. Thyroid Function Tests: No results for input(s): TSH, T4TOTAL, FREET4, T3FREE, THYROIDAB in the last 72 hours. Anemia Panel: No results for input(s): VITAMINB12, FOLATE, FERRITIN, TIBC, IRON, RETICCTPCT in the last 72 hours. Urine analysis:    Component Value Date/Time   COLORURINE YELLOW 12/03/2016 1847   APPEARANCEUR CLEAR 11/28/2016 1847   LABSPEC 1.013 12/01/2016 1847   PHURINE 5.0 11/13/2016 1847   GLUCOSEU NEGATIVE 12/11/2016 1847   HGBUR NEGATIVE 11/28/2016 1847   BILIRUBINUR NEGATIVE 12/09/2016 1847   KETONESUR NEGATIVE 12/11/2016 1847   PROTEINUR NEGATIVE 11/23/2016 1847   NITRITE NEGATIVE 11/26/2016 1847   LEUKOCYTESUR NEGATIVE 12/05/2016 1847   Sepsis Labs: No results found for this or any previous visit (from the past 240 hour(s)).   Radiological Exams on Admission: Dg Chest 2 View  Result Date: 12/10/2016 CLINICAL DATA:  Generalized pain EXAM: CHEST  2 VIEW COMPARISON:  06/23/2005 FINDINGS: Asymmetric elevation right hemidiaphragm with right base collapse/consolidation and small right pleural effusion. The cardiopericardial silhouette is within normal limits for size. Right Port-A-Cath tip projects distal SVC level. Gastrostomy tube overlies the left upper quadrant. Degenerative changes are noted in the right shoulder. IMPRESSION: Right base collapse/ consolidation with small right pleural effusion. Aspiration and/ or infection could cause this appearance. Given the history of multiple  pulmonary nodule seen on recent CT scan, malignant effusion also consideration. Electronically Signed   By: Misty Stanley M.D.   On: 11/22/2016 20:14    EKG: Independently reviewed. Sinus tachycardia 142 bpm  Assessment/Plan Sepsis 2/2 pneumonia: Acute patient presents with complaints of cough. Found to have fever up to 103.18F, tachycardia, and tachypnea. WBC 3.7 and lactic acid noted to be up  to 1.93. Chest x-ray showing right base collapse /consolidation with small pleural effusion. Patient was empirically treated with broad-spectrum antibiotics. - Admit to telemetry bed - Follow-up blood and respiratory studies - Continue empiric antibiotics of cefepime and Zosyn   - Tylenol  Prn fever - Trend lactic acid level  - IV fluids of NS at 116ml/hr as tolerated  Supraglottic cancer s/p resection: Patient status post radiation and followed by [name] on chemotherapy. - morphine prn pain - Notify [name] of the patient being admitted into the hospital  Dysphagia S/p PEG - Will need to continue tube feed  Anemia associated with chemotherapy: Hemoglobin appears to have slowly been trending downward last noted to be 8.9 currently 8.2. - Type and Screen for possible need of blood products - Continue to monitor  Fall with Generalized weakness - Physical therapy  Neuropathy - Continue gabapentin   DVT prophylaxis: scd  Code Status: Full  Family Communication: No Family present at bedside Disposition Plan: TBD Consults called: none Admission status: inpatient  Norval Morton MD [facility] Pager [phone]   If 7PM-7AM, please contact night-coverage [email or web address] Password [facility]  12/03/2016, 10:58 PM

## 2016-12-04 NOTE — Telephone Encounter (Signed)
Sister is waiting room concerned about brother. He has fallen at home multiple times, not eating much and loosing weight. She is concerned, but does not want him to know she came to the cancer center. Instructed her to call 911 or I could call. States that she will call.

## 2016-12-04 NOTE — ED Provider Notes (Signed)
Emergency Department Provider Note   I have reviewed the triage vital signs and the nursing notes.   HISTORY  Chief Complaint Neck Pain; Fever; and Tachycardia   HPI Antonio [name] is a 61 y.o. male with a history of head and neck malignancy status post resection and radiation therapy also multiple chronic issues to include COPD cough sinus infections arthritis and sickle cell trait.  He presents the emergency department today secondary to generalized body aches.  Is been going on for a couple days of progressively worsening is also had increased weakness during this time.  Does state that he has neck pain but also states he has arm pain leg pain abdominal pain chest pain back pain and headache.  Has not tried any at home for the symptoms.  His sisters are here and said they are the ones that wanted him to come in to be evaluated.  When asked about his fever he said he did not know he had a fever he is is felt cold for approximately 24 hours.  No urinary symptoms.  He does endorse a cough that is nonproductive.  No other associated or modifying symptoms.  No history of the same.  Past Medical History:  Diagnosis Date  . Allergy    allergic rhinitis  . Arthritis    "qwhere" (05/17/2016)  . Chronic cough   . Chronic sinus infection   . Colonic polyp   . COPD (chronic obstructive pulmonary disease) ([place])   . 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 Dysphagia    normal EGD except for mild gastritis in 01/2006  . GERD (gastroesophageal reflux disease)   . Head and neck malignancy ([place]) 05/18/2016  . High cholesterol   . History of radiation therapy 06/28/16- 08/17/16   Larynx 70 Gy in 35 fractions to gross disease, 63 Gy in 35 fractions to high risk nodal echelons, and 56 Gy in 35 fractions to intermediate risk nodal echelons  . History of radiation therapy 06/28/16- 08/17/16   Right Lung 60 Gy in 5 fractions--- given at same time as Larynx radiation  . Hypertension     . Rectal bleeding    colonoscopy in 01/2006  . Rectal polyp    01/2006 with path showing it to be benign, though with a question that it was incomplete biopsy, next scheduled coloscopy 2010  . Sickle cell trait ([place])   . Tobacco abuse   . Varus deformity of knee    bilateral, congenital    Patient Active Problem List   Diagnosis Date Noted  . Sepsis ([place]) 12/12/2016  . Protein-calorie malnutrition, moderate ([place]) 11/28/2016  . Dysphagia   . Failure to thrive in adult   . 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 Encounter for palliative care   . Hypercalcemia 11/13/2016  . Hypercalcemia of malignancy 11/07/2016  . Port catheter in place 11/06/2016  . Acquired lymphedema 09/15/2016  . Anemia, chronic disease 08/30/2016  . Prerenal acute renal failure ([place]) 08/26/2016  . Pancytopenia, acquired ([place]) 07/27/2016  . Nausea without vomiting 07/27/2016  . Other constipation 07/20/2016  . 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 Malignant neoplasm of lower lobe of right lung ([place]) 06/28/2016  . Cancer associated pain 05/31/2016  . Weight loss 05/31/2016  . Dysarthria 05/31/2016  . 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 Sciatic leg pain 02/24/2015  . Goals of care, counseling/discussion 12/01/2014  . Hyperlipidemia 02/20/2013  .  Varus deformity of knee 06/16/2011  . COLONIC POLYPS 03/14/2006  . Allergic rhinitis 01/19/2006  . COPD 01/19/2006  . Essential hypertension 01/11/2006  . Osteoarthrosis involving lower leg 01/11/2006    Past Surgical History:  Procedure Laterality Date  . COLONOSCOPY W/ BIOPSIES AND POLYPECTOMY  X 2  . IR FLUORO GUIDE PORT INSERTION RIGHT  06/23/2016  . IR GASTROSTOMY TUBE MOD SED  06/23/2016  . IR US GUIDE VASC ACCESS RIGHT  06/23/2016  . MULTIPLE TOOTH EXTRACTIONS  ~ 2008   "took them all out"    Current Outpatient Rx  . Order #: [id number] Class: Print  . Order #: [id number] Class: Normal  . Order #: [id number] Class:  Print  . Order #: [id number] Class: Print  . Order #: [id number] Class: Normal  . Order #: [id number] Class: Normal  . Order #: [id number] Class: Normal    Allergies Patient has no known allergies.  Family History  Problem Relation Age of Onset  . Cancer Mother        gastric ca  . Hypertension Father   . Diabetes Father   . Diabetes Sister   . CAD Sister     Social History Social History  Substance Use Topics  . Smoking status: Former Smoker    Packs/day: 0.12    Years: 43.00    Types: Cigarettes  . Smokeless tobacco: Never Used     Comment: cuting back  . Alcohol use 14.4 oz/week    24 Cans of beer per week     Comment: he tells me he has decreased. He is drinking 6-8 cans of beer a week.     Review of Systems  All other systems negative except as documented in the HPI. All pertinent positives and negatives as reviewed in the HPI. ____________________________________________   PHYSICAL EXAM:  VITAL SIGNS: ED Triage Vitals  Enc Vitals Group     BP 11/30/2016 1816 120/65     Pulse Rate 11/29/2016 1816 (!) 141     Resp 11/22/2016 1816 18     Temp 11/20/2016 1816 (!) 103.1 F (39.5 C)     Temp Source 12/13/2016 1816 Oral     SpO2 11/24/2016 1814 96 %     Weight 11/25/2016 1817 128 lb (58.1 kg)     Height 12/06/2016 1817 5\' 4"  (1.626 m)     Head Circumference --      Peak Flow --      Pain Score --      Pain Loc --      Pain Edu? --      Excl. in [place]? --     Constitutional: Alert and oriented. Well appearing and in no acute distress. Eyes: Conjunctivae are normal. PERRL. EOMI. Head: Atraumatic. Nose: No congestion/rhinnorhea. Mouth/Throat: Mucous membranes are moist.  Oropharynx non-erythematous. Neck: No stridor.  No meningeal signs.   Cardiovascular: Normal rate, regular rhythm. Good peripheral circulation. Grossly normal heart sounds.   Respiratory: Normal respiratory effort.  No retractions. Lungs diminished with diffuse rhonchi. Gastrointestinal: Soft and nontender. No  distention.  Musculoskeletal: No lower extremity tenderness nor edema. No gross deformities of extremities. Neurologic:  Normal speech and language. No gross focal neurologic deficits are appreciated.  Skin:  Skin is warm, dry and intact. No rash noted.  ____________________________________________   LABS (all labs ordered are listed, but only abnormal results are displayed)  Labs Reviewed  COMPREHENSIVE METABOLIC PANEL - Abnormal; Notable for the following:       Result Value   Chloride 98 (*)  Glucose, Bld 135 (*)    BUN 23 (*)    Calcium 8.6 (*)    Total Protein 6.3 (*)    Albumin 2.5 (*)    Total Bilirubin 1.3 (*)    All other components within normal limits  CBC WITH DIFFERENTIAL/PLATELET - Abnormal; Notable for the following:    WBC 3.7 (*)    RBC 3.06 (*)    Hemoglobin 8.2 (*)    HCT 23.5 (*)    MCV 76.8 (*)    RDW 16.4 (*)    Lymphs Abs 0.2 (*)    All other components within normal limits  PROTIME-INR - Abnormal; Notable for the following:    Prothrombin Time 16.0 (*)    All other components within normal limits  TROPONIN I - Abnormal; Notable for the following:    Troponin I 0.03 (*)    All other components within normal limits  I-STAT CG4 LACTIC ACID, ED - Abnormal; Notable for the following:    Lactic Acid, Venous 1.92 (*)    All other components within normal limits  CULTURE, BLOOD (ROUTINE X 2)  CULTURE, BLOOD (ROUTINE X 2)  RAPID STREP SCREEN (NOT AT [facility])  GRAM STAIN  CULTURE, EXPECTORATED SPUTUM-ASSESSMENT  URINALYSIS, ROUTINE W REFLEX MICROSCOPIC  INFLUENZA PANEL BY PCR (TYPE A & B)  STREP PNEUMONIAE URINARY ANTIGEN  LACTIC ACID, PLASMA  LACTIC ACID, PLASMA  PROCALCITONIN  CBC  BASIC METABOLIC PANEL  LEGIONELLA PNEUMOPHILA SEROGP 1 UR AG  TROPONIN I  TROPONIN I  TROPONIN I  I-STAT CG4 LACTIC ACID, ED  I-STAT CG4 LACTIC ACID, ED  I-STAT CG4 LACTIC ACID, ED   ____________________________________________  EKG   EKG  Interpretation  Date/Time:  Monday December 04 2016 19:13:30 EDT Ventricular Rate:  142 PR Interval:    QRS Duration: 69 QT Interval:  284 QTC Calculation: 437 R Axis:   53 Text Interpretation:  Sinus tachycardia Ventricular premature complex Aberrant conduction of SV complex(es) Abnormal R-wave progression, early transition faster than previous Confirmed by Merrily Pew [phone]) on 12/05/2016 12:08:21 AM       ____________________________________________  RADIOLOGY  Dg Chest 2 View  Result Date: 11/25/2016 CLINICAL DATA:  Generalized pain EXAM: CHEST  2 VIEW COMPARISON:  06/23/2005 FINDINGS: Asymmetric elevation right hemidiaphragm with right base collapse/consolidation and small right pleural effusion. The cardiopericardial silhouette is within normal limits for size. Right Port-A-Cath tip projects distal SVC level. Gastrostomy tube overlies the left upper quadrant. Degenerative changes are noted in the right shoulder. IMPRESSION: Right base collapse/ consolidation with small right pleural effusion. Aspiration and/ or infection could cause this appearance. Given the history of multiple pulmonary nodule seen on recent CT scan, malignant effusion also consideration. Electronically Signed   By: Misty Stanley M.D.   On: 11/26/2016 20:14    ____________________________________________   PROCEDURES  Procedure(s) performed:   Procedures  CRITICAL CARE Performed by: Merrily Pew Total critical care time: 35 minutes Critical care time was exclusive of separately billable procedures and treating other patients. Critical care was necessary to treat or prevent imminent or life-threatening deterioration. Critical care was time spent personally by me on the following activities: development of treatment plan with patient and/or surrogate as well as nursing, discussions with consultants, evaluation of patient's response to treatment, examination of patient, obtaining history from patient or  surrogate, ordering and performing treatments and interventions, ordering and review of laboratory studies, ordering and review of radiographic studies, pulse oximetry and re-evaluation of patient's condition.  ____________________________________________  INITIAL IMPRESSION / ASSESSMENT AND PLAN / ED COURSE  Pertinent labs & imaging results that were available during my care of the patient were reviewed by me and considered in my medical decision making (see chart for details).  61 year old male with a past medical history of cancer and multiple areas presents the emergency department today with sepsis.  Patient tachycardic to the 140s, tachypneic with a weak cough.  Blood pressures were okay but temperature was 103.1.  Start on broad-spectrum antibiotics as soon as he was evaluated and fluids were initiated for concern of possible influenza versus pneumonia.  Chest x-ray came back with possible pneumonia versus malignant pleural effusion.  Influenza panel still pending.  Heart rate improved in the 140s-120s with fluids her lactic acid seems to be slightly worse however still less than 2.  Will admit to the hospitalist for continued management with antibiotics and fluids.   ____________________________________________  FINAL CLINICAL IMPRESSION(S) / ED DIAGNOSES  Final diagnoses:  Sepsis, due to unspecified organism ([place])     MEDICATIONS GIVEN DURING THIS VISIT:  Medications  morphine (MSIR) tablet 15 mg (not administered)  free water 100 mL (not administered)  gabapentin (NEURONTIN) capsule 300 mg (not administered)  lidocaine-prilocaine (EMLA) cream (not administered)  senna-docusate (Senokot-S) tablet 1 tablet (not administered)  enoxaparin (LOVENOX) injection 40 mg (not administered)  ondansetron (ZOFRAN) tablet 4 mg (not administered)    Or  ondansetron (ZOFRAN) injection 4 mg (not administered)  acetaminophen (TYLENOL) tablet 650 mg (not administered)    Or  acetaminophen  (TYLENOL) suppository 650 mg (not administered)  0.9 %  sodium chloride infusion (not administered)  vancomycin (VANCOCIN) IVPB 750 mg/150 ml premix (not administered)  sodium chloride 0.9 % bolus 1,000 mL (0 mLs Intravenous Stopped 11/30/2016 2243)  vancomycin (VANCOCIN) IVPB 1000 mg/200 mL premix (0 mg Intravenous Stopped 11/21/2016 2238)  piperacillin-tazobactam (ZOSYN) IVPB 3.375 g (0 g Intravenous Stopped 11/24/2016 2001)  morphine 4 MG/ML injection 6 mg (6 mg Intravenous Given 11/15/2016 2011)  acetaminophen (TYLENOL) tablet 650 mg (650 mg Oral Given 11/26/2016 2212)  sodium chloride 0.9 % bolus 1,000 mL (0 mLs Intravenous Stopped 12/05/2016 2353)  ibuprofen (ADVIL,MOTRIN) tablet 800 mg (800 mg Oral Given 12/11/2016 2211)  sodium chloride 0.9 % bolus 1,000 mL (1,000 mLs Intravenous New Bag/Given 11/17/2016 2243)     NEW OUTPATIENT MEDICATIONS STARTED DURING THIS VISIT:  New Prescriptions   No medications on file    Note:  This document was prepared using Dragon voice recognition software and may include unintentional dictation errors.   Ilze Roselli, Corene Cornea, MD 12/05/16 [phone]

## 2016-12-05 ENCOUNTER — Other Ambulatory Visit: Payer: Self-pay

## 2016-12-05 DIAGNOSIS — D6481 Anemia due to antineoplastic chemotherapy: Secondary | ICD-10-CM | POA: Diagnosis present

## 2016-12-05 DIAGNOSIS — W19XXXA Unspecified fall, initial encounter: Secondary | ICD-10-CM

## 2016-12-05 DIAGNOSIS — F1910 Other psychoactive substance abuse, uncomplicated: Secondary | ICD-10-CM

## 2016-12-05 DIAGNOSIS — Y92009 Unspecified place in unspecified non-institutional (private) residence as the place of occurrence of the external cause: Secondary | ICD-10-CM

## 2016-12-05 DIAGNOSIS — G893 Neoplasm related pain (acute) (chronic): Secondary | ICD-10-CM

## 2016-12-05 DIAGNOSIS — Z9181 History of falling: Secondary | ICD-10-CM

## 2016-12-05 DIAGNOSIS — D61810 Antineoplastic chemotherapy induced pancytopenia: Secondary | ICD-10-CM

## 2016-12-05 DIAGNOSIS — T451X5A Adverse effect of antineoplastic and immunosuppressive drugs, initial encounter: Secondary | ICD-10-CM

## 2016-12-05 DIAGNOSIS — C7800 Secondary malignant neoplasm of unspecified lung: Secondary | ICD-10-CM

## 2016-12-05 DIAGNOSIS — R7881 Bacteremia: Secondary | ICD-10-CM

## 2016-12-05 DIAGNOSIS — J189 Pneumonia, unspecified organism: Secondary | ICD-10-CM

## 2016-12-05 DIAGNOSIS — J15211 Pneumonia due to Methicillin susceptible Staphylococcus aureus: Secondary | ICD-10-CM

## 2016-12-05 DIAGNOSIS — C321 Malignant neoplasm of supraglottis: Secondary | ICD-10-CM

## 2016-12-05 DIAGNOSIS — E440 Moderate protein-calorie malnutrition: Secondary | ICD-10-CM

## 2016-12-05 DIAGNOSIS — R6521 Severe sepsis with septic shock: Secondary | ICD-10-CM

## 2016-12-05 DIAGNOSIS — A419 Sepsis, unspecified organism: Secondary | ICD-10-CM

## 2016-12-05 LAB — BASIC METABOLIC PANEL
ANION GAP: 9 (ref 5–15)
ANION GAP: 11 (ref 5–15)
ANION GAP: 9 (ref 5–15)
BUN: 23 mg/dL — AB (ref 6–20)
BUN: 21 mg/dL — ABNORMAL HIGH (ref 6–20)
BUN: 21 mg/dL — ABNORMAL HIGH (ref 6–20)
CALCIUM: 7.7 mg/dL — AB (ref 8.9–10.3)
CALCIUM: 7.4 mg/dL — AB (ref 8.9–10.3)
CO2: 26 mmol/L (ref 22–32)
CO2: 23 mmol/L (ref 22–32)
CO2: 23 mmol/L (ref 22–32)
CREATININE: 0.75 mg/dL (ref 0.61–1.24)
Calcium: 7.1 mg/dL — ABNORMAL LOW (ref 8.9–10.3)
Chloride: 103 mmol/L (ref 101–111)
Chloride: 104 mmol/L (ref 101–111)
Chloride: 105 mmol/L (ref 101–111)
Creatinine, Ser: 0.91 mg/dL (ref 0.61–1.24)
Creatinine, Ser: 0.78 mg/dL (ref 0.61–1.24)
GFR calc Af Amer: >60 mL/min (ref >60)
GFR calc Af Amer: >60 mL/min (ref >60)
GFR calc non Af Amer: >60 mL/min (ref >60)
GLUCOSE: 130 mg/dL — AB (ref 65–99)
GLUCOSE: 157 mg/dL — AB (ref 65–99)
Glucose, Bld: 168 mg/dL — ABNORMAL HIGH (ref 65–99)
POTASSIUM: 2.9 mmol/L — AB (ref 3.5–5.1)
Potassium: 3.5 mmol/L (ref 3.5–5.1)
Potassium: 2.8 mmol/L — ABNORMAL LOW (ref 3.5–5.1)
SODIUM: 138 mmol/L (ref 135–145)
SODIUM: 137 mmol/L (ref 135–145)
SODIUM: 138 mmol/L (ref 135–145)

## 2016-12-05 LAB — CORTISOL
CORTISOL PLASMA: 27.4 ug/dL
Cortisol, Plasma: 23.9 ug/dL

## 2016-12-05 LAB — INFLUENZA PANEL BY PCR (TYPE A & B)
INFLAPCR: NEGATIVE
INFLBPCR: NEGATIVE

## 2016-12-05 LAB — LACTIC ACID, PLASMA
LACTIC ACID, VENOUS: 1.2 mmol/L (ref 0.5–1.9)
LACTIC ACID, VENOUS: 1.2 mmol/L (ref 0.5–1.9)
LACTIC ACID, VENOUS: 2.1 mmol/L — AB (ref 0.5–1.9)
Lactic Acid, Venous: 1.9 mmol/L (ref 0.5–1.9)

## 2016-12-05 LAB — MAGNESIUM
MAGNESIUM: 2 mg/dL (ref 1.7–2.4)
Magnesium: 2 mg/dL (ref 1.7–2.4)

## 2016-12-05 LAB — BLOOD CULTURE ID PANEL (REFLEXED)
Acinetobacter baumannii: NOT DETECTED
CANDIDA ALBICANS: NOT DETECTED
CANDIDA GLABRATA: NOT DETECTED
CANDIDA KRUSEI: NOT DETECTED
CANDIDA TROPICALIS: NOT DETECTED
Candida parapsilosis: NOT DETECTED
ENTEROCOCCUS SPECIES: NOT DETECTED
Enterobacter cloacae complex: NOT DETECTED
Enterobacteriaceae species: NOT DETECTED
Escherichia coli: NOT DETECTED
Haemophilus influenzae: NOT DETECTED
Klebsiella oxytoca: NOT DETECTED
Klebsiella pneumoniae: NOT DETECTED
Listeria monocytogenes: NOT DETECTED
METHICILLIN RESISTANCE: NOT DETECTED
Neisseria meningitidis: NOT DETECTED
PROTEUS SPECIES: NOT DETECTED
PSEUDOMONAS AERUGINOSA: NOT DETECTED
SERRATIA MARCESCENS: NOT DETECTED
STREPTOCOCCUS PNEUMONIAE: NOT DETECTED
STREPTOCOCCUS PYOGENES: NOT DETECTED
Staphylococcus aureus (BCID): DETECTED — AB
Staphylococcus species: DETECTED — AB
Streptococcus agalactiae: NOT DETECTED
Streptococcus species: NOT DETECTED

## 2016-12-05 LAB — EXPECTORATED SPUTUM ASSESSMENT W REFEX TO RESP CULTURE

## 2016-12-05 LAB — PROCALCITONIN
PROCALCITONIN: 10.53 ng/mL
PROCALCITONIN: 10.67 ng/mL

## 2016-12-05 LAB — CBC
HCT: 21.6 % — ABNORMAL LOW (ref 39.0–52.0)
Hemoglobin: 7.4 g/dL — ABNORMAL LOW (ref 13.0–17.0)
MCH: 26.6 pg (ref 26.0–34.0)
MCHC: 34.3 g/dL (ref 30.0–36.0)
MCV: 77.7 fL — AB (ref 78.0–100.0)
PLATELETS: 139 10*3/uL — AB (ref 150–400)
RBC: 2.78 MIL/uL — ABNORMAL LOW (ref 4.22–5.81)
RDW: 16.3 % — AB (ref 11.5–15.5)
WBC: 2.8 10*3/uL — AB (ref 4.0–10.5)

## 2016-12-05 LAB — GLUCOSE, CAPILLARY
GLUCOSE-CAPILLARY: 154 mg/dL — AB (ref 65–99)
Glucose-Capillary: 114 mg/dL — ABNORMAL HIGH (ref 65–99)

## 2016-12-05 LAB — MRSA PCR SCREENING: MRSA BY PCR: NEGATIVE

## 2016-12-05 LAB — STREP PNEUMONIAE URINARY ANTIGEN: STREP PNEUMO URINARY ANTIGEN: NEGATIVE

## 2016-12-05 LAB — EXPECTORATED SPUTUM ASSESSMENT W GRAM STAIN, RFLX TO RESP C

## 2016-12-05 LAB — ABO/RH: ABO/RH(D): O POS

## 2016-12-05 LAB — RAPID STREP SCREEN (MED CTR MEBANE ONLY): Streptococcus, Group A Screen (Direct): NEGATIVE

## 2016-12-05 LAB — TROPONIN I
Troponin I: 0.03 ng/mL (ref <0.03)
Troponin I: <0.03 ng/mL (ref <0.03)

## 2016-12-05 LAB — PHOSPHORUS
PHOSPHORUS: 2.4 mg/dL — AB (ref 2.5–4.6)
PHOSPHORUS: 2.5 mg/dL (ref 2.5–4.6)

## 2016-12-05 LAB — PREPARE RBC (CROSSMATCH)

## 2016-12-05 MED ORDER — VANCOMYCIN HCL IN DEXTROSE 750-5 MG/150ML-% IV SOLN
750.0000 mg | Freq: Two times a day (BID) | INTRAVENOUS | Status: DC
  Filled 2016-12-05: qty 150

## 2016-12-05 MED ORDER — VANCOMYCIN HCL IN DEXTROSE 750-5 MG/150ML-% IV SOLN: 750.0000 mg | Freq: Two times a day (BID) | INTRAVENOUS | Status: DC

## 2016-12-05 MED ORDER — OSMOLITE 1.5 CAL PO LIQD
237.0000 mL | Freq: Every day | ORAL | Status: DC
  Administered 2016-12-05 – 2016-12-13 (×43): 237 mL
  Filled 2016-12-05 (×51): qty 237

## 2016-12-05 MED ORDER — ACETAMINOPHEN 325 MG PO TABS
650.0000 mg | ORAL_TABLET | Freq: Four times a day (QID) | ORAL | Status: DC | PRN
  Administered 2016-12-06 – 2016-12-20 (×7): 650 mg
  Filled 2016-12-05 (×7): qty 2

## 2016-12-05 MED ORDER — CEFAZOLIN SODIUM-DEXTROSE 2-4 GM/100ML-% IV SOLN
2.0000 g | Freq: Three times a day (TID) | INTRAVENOUS | Status: DC
  Administered 2016-12-05 – 2016-12-18 (×40): 2 g via INTRAVENOUS
  Filled 2016-12-05 (×44): qty 100

## 2016-12-05 MED ORDER — JEVITY 1.2 CAL PO LIQD
1000.0000 mL | ORAL | Status: DC
  Filled 2016-12-05: qty 1000

## 2016-12-05 MED ORDER — DEXTROSE 5 % IV SOLN
1.0000 g | Freq: Three times a day (TID) | INTRAVENOUS | Status: DC
  Administered 2016-12-05 (×2): 1 g via INTRAVENOUS
  Filled 2016-12-05 (×2): qty 1

## 2016-12-05 MED ORDER — SODIUM CHLORIDE 0.9 % IV BOLUS (SEPSIS)
1000.0000 mL | Freq: Once | INTRAVENOUS | Status: AC
  Administered 2016-12-05: 1000 mL via INTRAVENOUS

## 2016-12-05 MED ORDER — POTASSIUM CHLORIDE 20 MEQ/15ML (10%) PO SOLN
40.0000 meq | Freq: Once | ORAL | Status: AC
  Administered 2016-12-05: 40 meq
  Filled 2016-12-05: qty 30

## 2016-12-05 MED ORDER — SODIUM CHLORIDE 0.9 % IV SOLN: Freq: Once | INTRAVENOUS | Status: DC

## 2016-12-05 MED ORDER — IPRATROPIUM-ALBUTEROL 0.5-2.5 (3) MG/3ML IN SOLN
3.0000 mL | Freq: Four times a day (QID) | RESPIRATORY_TRACT | Status: DC | PRN
  Administered 2016-12-18: 3 mL via RESPIRATORY_TRACT
  Filled 2016-12-05: qty 3

## 2016-12-05 MED ORDER — ACETAMINOPHEN 650 MG RE SUPP: 650.0000 mg | Freq: Four times a day (QID) | RECTAL | Status: DC | PRN

## 2016-12-05 MED ORDER — POTASSIUM CHLORIDE 10 MEQ/100ML IV SOLN
10.0000 meq | INTRAVENOUS | Status: AC
  Administered 2016-12-05 – 2016-12-06 (×4): 10 meq via INTRAVENOUS
  Filled 2016-12-05 (×4): qty 100

## 2016-12-05 MED ORDER — ORAL CARE MOUTH RINSE
15.0000 mL | Freq: Two times a day (BID) | OROMUCOSAL | Status: DC
  Administered 2016-12-05 – 2016-12-18 (×26): 15 mL via OROMUCOSAL

## 2016-12-05 MED ORDER — IBUPROFEN 100 MG/5ML PO SUSP
600.0000 mg | Freq: Once | ORAL | Status: AC
  Administered 2016-12-05: 600 mg
  Filled 2016-12-05 (×2): qty 30

## 2016-12-05 MED ORDER — GABAPENTIN 250 MG/5ML PO SOLN
300.0000 mg | Freq: Three times a day (TID) | ORAL | Status: DC
  Administered 2016-12-05 – 2016-12-18 (×42): 300 mg
  Filled 2016-12-05 (×46): qty 6

## 2016-12-05 MED ORDER — CHLORHEXIDINE GLUCONATE 0.12 % MT SOLN
15.0000 mL | Freq: Two times a day (BID) | OROMUCOSAL | Status: DC
  Administered 2016-12-05 – 2016-12-18 (×29): 15 mL via OROMUCOSAL
  Filled 2016-12-05 (×26): qty 15

## 2016-12-05 NOTE — Progress Notes (Signed)
Attempted to give blood around 1615 but temp was 99.9. Tylenol given and rechecked temp @ 1735 and was 100.7. MD notified of blood not being given yet per protocol. Will report to night shift on need to administer 1 unit PRBC when temp has gone down.

## 2016-12-05 NOTE — Progress Notes (Signed)
Referring Physician(s): Hatcher,J/Gorsuch,N  Supervising Physician: Aletta Edouard  Patient Status:  [facility] - In-pt  Chief Complaint:  "I have an infection"  Subjective: Patient familiar to IR service from prior left cervical lymph node biopsy in April of this year as well as a right chest wall Port-A-Cath and G tube placement on 06/23/16. He has a history of metastatic laryngeal cancer and status post chemoradiation. Past medical history also significant for COPD, sickle cell trait, polysubstance abuse. He now presents with methicillin sensitive staph aureus bacteremia. He is currently afebrile but tachycardic. Latest WBC is 2.8, plts 139k, hgb 7.4. Request now received for Port-A-Cath removal.   Past Medical History:  Diagnosis Date  . Allergy    allergic rhinitis  . Arthritis    "qwhere" (05/17/2016)  . Chronic cough   . Chronic sinus infection   . Colonic polyp   . COPD (chronic obstructive pulmonary disease) ([place])   . 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 Dysphagia    normal EGD except for mild gastritis in 01/2006  . GERD (gastroesophageal reflux disease)   . Head and neck malignancy ([place]) 05/18/2016  . High cholesterol   . History of radiation therapy 06/28/16- 08/17/16   Larynx 70 Gy in 35 fractions to gross disease, 63 Gy in 35 fractions to high risk nodal echelons, and 56 Gy in 35 fractions to intermediate risk nodal echelons  . History of radiation therapy 06/28/16- 08/17/16   Right Lung 60 Gy in 5 fractions--- given at same time as Larynx radiation  . Hypertension   . Rectal bleeding    colonoscopy in 01/2006  . Rectal polyp    01/2006 with path showing it to be benign, though with a question that it was incomplete biopsy, next scheduled coloscopy 2010  . Sickle cell trait ([place])   . Tobacco abuse   . Varus deformity of knee    bilateral, congenital   Past Surgical History:  Procedure Laterality Date  . COLONOSCOPY W/ BIOPSIES AND  POLYPECTOMY  X 2  . IR FLUORO GUIDE PORT INSERTION RIGHT  06/23/2016  . IR GASTROSTOMY TUBE MOD SED  06/23/2016  . IR US GUIDE VASC ACCESS RIGHT  06/23/2016  . MULTIPLE TOOTH EXTRACTIONS  ~ 2008   "took them all out"      Allergies: Patient has no known allergies.  Medications: Prior to Admission medications   Medication Sig Start Date End Date Taking? Authorizing Provider  gabapentin (NEURONTIN) 300 MG capsule Take 1 capsule (300 mg total) by mouth 3 (three) times daily. 08/24/16  Yes Gorsuch, Ni, MD  lidocaine-prilocaine (EMLA) cream Apply to affected area once 06/14/16  Yes Gorsuch, Ni, MD  morphine (MSIR) 15 MG tablet Take 1 tablet (15 mg total) by mouth every 4 (four) hours as needed for severe pain. 11/23/16  Yes [name], MD  Nutritional Supplements (FEEDING SUPPLEMENT, OSMOLITE 1.5 CAL,) LIQD Begin Osmolite 1.5 via PEG TID with 60 mL free water. Advance slowly to  goal of 2 bottles QID with 60 mL free water before and after. In addition, drink or flush PEG with 240 mL free water TID. 11/19/16  Yes Florencia Reasons, MD  senna-docusate (SENOKOT-S) 8.6-50 MG tablet Take 1 tablet by mouth 2 (two) times daily. 06/14/16  Yes [name], MD  Water For Irrigation, Sterile (FREE WATER) SOLN Place 100 mLs into feeding tube every 4 (four) hours. 11/19/16  Yes Florencia Reasons, MD  ondansetron (ZOFRAN) 8 MG  tablet Take 1 tablet (8 mg total) by mouth 2 (two) times daily as needed. Start on the third day after chemotherapy. Patient not taking: Reported on 10/18/2016 06/14/16   [name], MD     Vital Signs: BP 130/66 (BP Location: Left Arm)   Pulse (!) 124   Temp 98.9 F (37.2 C) (Oral)   Resp 18   Ht 5\' 3"  (1.6 m)   Wt 120 lb 9.5 oz (54.7 kg)   SpO2 99%   BMI 21.36 kg/m   Physical Exam awake, alert. Chest with diminished breath sounds right base, left clear. Clean, intact right chest wall Port-A-Cath; site with some minimal tenderness to palpation, no erythema/drainage; some soft tissue fullness apical  portion of port pocket; Heart with tachycardic but regular rhythm. Abdomen soft, positive bowel sounds, intact gastrostomy tube, NT. No lower extremity edema.  Imaging: Dg Chest 2 View  Result Date: 11/27/2016 CLINICAL DATA:  Generalized pain EXAM: CHEST  2 VIEW COMPARISON:  06/23/2005 FINDINGS: Asymmetric elevation right hemidiaphragm with right base collapse/consolidation and small right pleural effusion. The cardiopericardial silhouette is within normal limits for size. Right Port-A-Cath tip projects distal SVC level. Gastrostomy tube overlies the left upper quadrant. Degenerative changes are noted in the right shoulder. IMPRESSION: Right base collapse/ consolidation with small right pleural effusion. Aspiration and/ or infection could cause this appearance. Given the history of multiple pulmonary nodule seen on recent CT scan, malignant effusion also consideration. Electronically Signed   By: Misty Stanley M.D.   On: 11/20/2016 20:14    Labs:  CBC:  Recent Labs  11/18/16 0745 11/29/16 1038 12/07/2016 1837 12/05/16 0244  WBC 12.3* 9.4 3.7* 2.8*  HGB 9.6* 8.9* 8.2* 7.4*  HCT 27.9* 27.0* 23.5* 21.6*  PLT 257 227 184 139*    COAGS:  Recent Labs  05/18/16 0642 06/23/16 0954 11/28/2016 1837  INR 1.10 1.01 1.29  APTT 35  --   --     BMP:  Recent Labs  11/18/16 0745 11/19/16 0500 11/29/16 1037 11/15/2016 1837 12/05/16 0244  NA 138 140 139 137 138  K 3.8 3.4* 3.7 3.7 3.5  CL 103 101  --  98* 103  CO2 28 31 28 29 26   GLUCOSE 87 108* 160* 135* 130*  BUN 16 21* 18.4 23* 23*  CALCIUM 9.3 9.2 9.6 8.6* 7.7*  CREATININE 0.82 0.84 0.9 1.00 0.91  GFRNONAA >60 >60  --  >60 >60  GFRAA >60 >60  --  >60 >60    LIVER FUNCTION TESTS:  Recent Labs  11/13/16 1338 11/16/16 0446 11/29/16 1037 11/17/2016 1837  BILITOT 0.57 0.3 0.82 1.3*  AST 17 17 32 38  ALT 12 13* 50 57  ALKPHOS 67 55 90 91  PROT 7.2 6.1* 6.6 6.3*  ALBUMIN 3.4* 3.0* 2.7* 2.5*    Assessment and Plan: Pt with  history of metastatic laryngeal cancer , status post chemoradiation. Past medical history also significant for COPD, sickle cell trait, polysubstance abuse. Port a cath /G tube placed 06/23/16. He now presents with methicillin sensitive staph aureus bacteremia. He is currently afebrile but tachycardic. Latest WBC is 2.8, plts 139k, hgb 7.4. Request now received for Port-A-Cath removal. Details/risks of procedure, including but not limited to, internal bleeding, worsening infection,injury to adjacent structures discussed with patient with his understanding and consent. Pt received lovenox today therefore procedure will be done on 10/24.     Electronically Signed: D. Rowe Robert, PA-C 12/05/2016, 3:02 PM   I spent a total  of 20 minutes at the the patient's bedside AND on the patient's hospital floor or unit, greater than 50% of which was counseling/coordinating care for port a cath removal   Patient ID: Antonio [name], Antonio [name]   DOB: Feb 08, 1956, 61 y.o.   MRN: [id number]

## 2016-12-05 NOTE — Progress Notes (Signed)
INTERIM PROGRESS NOTE  Subjective: Called by RN earlier today regarding inability to start transfusion due to elevated temperature. Pt's vitals otherwise stable at that time. Given tylenol. Unfortunately vital signs have grown more unstable through the evening. RN called again stating temperature 102.69F, pt more lethargic than earlier in the shift.   On my arrival, rapid response team is evaluating, BP's consistently low and verified manually. He actually seems cognitively about the same as this morning for me. Complains of dry mouth, wants water. Denies trouble breathing, chest pain, palpitations, light-headedness. Has urge to urinate.   Objective: BP (!) 88/52 (BP Location: Left Arm)   Pulse (!) 134   Temp (!) 102.3 F (39.1 C) (Oral)   Resp 18   Ht 5\' 3"  (1.6 m)   Wt 54.7 kg (120 lb 9.5 oz)   SpO2 93%   BMI 21.36 kg/m   Gen: Chronically ill-appearing male in no distress. Pulm: Clear and nonlabored. Voice remains hoarse though there is no stridor. Still no wheezing and no crackles. Diminished in right base laterally.  CV: Regular tachycardia that has sustained in 130-140's without murmur. No JVD or LE edema.  GI: Soft, NT, ND, +BS. PEG site wnl Neuro: Alert and oriented to person, place, situation. Able to recount this morning's visit. No focal deficits. Skin: No rashes or new wounds. Port site with normal appearing, nontender overlying skin.   Assessment & Plan: Sepsis due to MSSA bacteremia with worsening hypotension and tachycardia: Mental status unchanged for me. - 1L NS bolus - Transfer to SDU. I have paged CCM to consult on patient. He has conclusively remained full code, confirmed this evening. He would be a very difficult airway.   Vance Gather, MD [facility] Pager [phone] 12/05/2016, 7:15 PM

## 2016-12-05 NOTE — Progress Notes (Signed)
CRITICAL VALUE ALERT  Critical Value:  Lactic Acid 2.1  Date & Time Notied:  12/05/2017 @ 2245  Provider on call notified

## 2016-12-05 NOTE — Progress Notes (Signed)
[facility] Infusion Coordinator will follow pt with ID team to support home IV ABX for pharmacy services as needed at DC.    If patient discharges after hours, please call [phone].   Antonio [name] 12/05/2016, 4:42 PM

## 2016-12-05 NOTE — Progress Notes (Signed)
PHARMACY - PHYSICIAN COMMUNICATION CRITICAL VALUE ALERT - BLOOD CULTURE IDENTIFICATION (BCID)  Results for orders placed or performed during the hospital encounter of 11/17/2016  Blood Culture ID Panel (Reflexed) (Collected: 11/16/2016  6:37 PM)  Result Value Ref Range   Enterococcus species NOT DETECTED NOT DETECTED   Listeria monocytogenes NOT DETECTED NOT DETECTED   Staphylococcus species DETECTED (A) NOT DETECTED   Staphylococcus aureus DETECTED (A) NOT DETECTED   Methicillin resistance NOT DETECTED NOT DETECTED   Streptococcus species NOT DETECTED NOT DETECTED   Streptococcus agalactiae NOT DETECTED NOT DETECTED   Streptococcus pneumoniae NOT DETECTED NOT DETECTED   Streptococcus pyogenes NOT DETECTED NOT DETECTED   Acinetobacter baumannii NOT DETECTED NOT DETECTED   Enterobacteriaceae species NOT DETECTED NOT DETECTED   Enterobacter cloacae complex NOT DETECTED NOT DETECTED   Escherichia coli NOT DETECTED NOT DETECTED   Klebsiella oxytoca NOT DETECTED NOT DETECTED   Klebsiella pneumoniae NOT DETECTED NOT DETECTED   Proteus species NOT DETECTED NOT DETECTED   Serratia marcescens NOT DETECTED NOT DETECTED   Haemophilus influenzae NOT DETECTED NOT DETECTED   Neisseria meningitidis NOT DETECTED NOT DETECTED   Pseudomonas aeruginosa NOT DETECTED NOT DETECTED   Candida albicans NOT DETECTED NOT DETECTED   Candida glabrata NOT DETECTED NOT DETECTED   Candida krusei NOT DETECTED NOT DETECTED   Candida parapsilosis NOT DETECTED NOT DETECTED   Candida tropicalis NOT DETECTED NOT DETECTED    Name of physician (or Provider) Contacted: Grunz  Changes to prescribed antibiotics required:  change to Ancef 2 gm IV 8h and DC vanc/cefepime  Eudelia Bunch, Pharm.D. [phone] 12/05/2016 11:17 AM

## 2016-12-05 NOTE — Significant Event (Addendum)
Rapid Response Event Note  Overview: Time Called: 1846 Arrival Time: 1850 Event Type: Hypotension, Other (Comment) (Sepsis) Called by bedside RN in regards to patient being hypotensive, tachycardic, elevated temperature. Upon arrival, staff were obtaining EKG. Patient appeared to be alert and responsive. Initial Focused Assessment: Neuro: Alert and oriented x4, pupils equal and reactive bilaterally to light (size 2), all extremities equal in strength and motor response. Patient has no complaint of pain. Cardiac: ST (130s-140s), s1 and s2 heart sounds heard upon auscultation, 1+ pulses palpated in radial and pedal regions bilaterally, BP hypotensive systolic within low to mid 80s, see Vital signs  Pulmonary: Breath sounds clear in all lung fields, RR 20-25, no apparent distress, O2 Sats on RA 93%  Interventions: 1 Liter Saline Bolus ordered and all ready infusing 1 unit PRBC ordered 600mg  of ibuprofen placed for elevated temp Trx to SDU CCM consulted  Plan of Care (if not transferred):  Event Summary:   at      at          [facility] C

## 2016-12-05 NOTE — Progress Notes (Addendum)
Initial Nutrition Assessment  DOCUMENTATION CODES:   Severe malnutrition in context of chronic illness  INTERVENTION:   Osmolite 1.5 at 240 ml x6 times per day.  Water flushes at 60 ml before and after each feeding Pt to drink or flush feeding with additional 200 ml BID (when IVF d/c)  Provides: 2160kcals, 90grams protein, 1011ml free water (2214 ml with flushes). This meets 100% of calorie needs and 100% of protein needs.  NUTRITION DIAGNOSIS:   Malnutrition (Severe) related to chronic illness, cancer and cancer related treatments as evidenced by 15.4% weight loss in 2 months, severe depletion of body fat, severe depletion of muscle mass.  GOAL:   Patient will meet greater than or equal to 90% of their needs  MONITOR:   TF tolerance, PO intake, Labs, Weight trends, I & O's  REASON FOR ASSESSMENT:   Consult Enteral/tube feeding initiation and management  ASSESSMENT:   Pt with PMH significant for supraglottic cancer s/p radiation/chemotherapy/resection, dysphagia s/p PEG, COPD, tobacco abuse, and HTN. Recently sent over from [facility] 10/2 for admission with concerning hypercalcemia. Pt started chemotherapy Carboplatin/Taxol 10/5. Presents this admission with sepsis PNA and anemia associated with chemotherapy.   Spoke with pt at bedside. Reports being compliant with tube feeding at home. States he follows his current regimen of 2 bottles Osmolite 1.5 TID with 60 ml flushes before and after each feeding. Pt consuming full liquid products but unable to clarify which items.    Records indicate pt has lost 5.5% of body weight in the last month, totaling 15.4% of body weight in the last 2 months. This is significant. Unsure if patient has been compliant with tube feedings, as current regimen needs are to promote weight gain. Will continue with current regimen this hospital stay.   Pt was seen 10/16 by [place] RD. RD notes that patient has no trouble getting tube feeding  supplies or refills.   Nutrition-Focused physical exam completed. Findings are severe fat depletion, severe muscle depletion, and no edema.   Medications reviewed and include: NS @ 100 ml/hr, IV abx Labs reviewed: BUN 23 (H)   Diet Order:  Diet clear liquid Room service appropriate? Yes; Fluid consistency: Thin  Skin:  Reviewed, no issues  Last BM:  11/22/2016  Height:   Ht Readings from Last 1 Encounters:  12/05/16 5\' 3"  (1.6 m)    Weight:   Wt Readings from Last 1 Encounters:  12/05/16 120 lb 9.5 oz (54.7 kg)    Ideal Body Weight:  56.4 kg  BMI:  Body mass index is 21.36 kg/m.  Estimated Nutritional Needs:   Kcal:  1910-2182 kcal (35-39 kcal/kg)  Protein:  80-90 grams (1.4-1.6 g/kg)  Fluid:  2.2 L fluid  EDUCATION NEEDS:   No education needs identified at this time  [place], LDN Clinical Nutrition Pager # - [phone]

## 2016-12-05 NOTE — Progress Notes (Signed)
Pharmacy Antibiotic Note  Antonio [name] is a 61 y.o. male with hx supraglottic cancer admitted on 12/06/2016 with possible pneumonia.  Pharmacy has been consulted for vancomycin and cefepime dosing.  Has already received vancomycin 1g at 21:28 and Zosyn 3.375g at 19:21 in ED yesterday.  Plan: Vancomycin 750mg  IV q12h Check trough at steady state, goal 15-20 mcg/ml Recommend checking MRSA PCR nasal swab - stop vancomycin if negative Cefepime 1g IV q8h Follow up renal function & cultures, de-escalate as appropriate  Height: 5\' 3"  (160 cm) Weight: 128 lb (58.1 kg) IBW/kg (Calculated) : 56.9  Temp (24hrs), Avg:103 F (39.4 C), Min:102.8 F (39.3 C), Max:103.1 F (39.5 C)   Recent Labs Lab 11/29/16 1037 11/29/16 1038 11/21/2016 1837 11/13/2016 1851 12/05/2016 2057 11/22/2016 2201  WBC  --  9.4 3.7*  --   --   --   CREATININE 0.9  --  1.00  --   --   --   LATICACIDVEN  --   --   --  1.58 1.70 1.92*    Estimated Creatinine Clearance: 63.2 mL/min (by C-G formula based on SCr of 1 mg/dL).    No Known Allergies  Antimicrobials this admission:  10/22 Zosyn x 1 10/22 Vanc >> 10/23 Cefepime >>  Dose adjustments this admission:  ---  Microbiology results:  10/22 BCx: sent 10/22 Urine strep: 10/22 Urine legionella: 10/22 Influenza panel: Rapid strep screen: ordered Sputum: ordered  Thank you for allowing pharmacy to be a part of this patient's care.  Peggyann Juba, PharmD, BCPS Pager: [phone] 12/05/2016 12:13 AM

## 2016-12-05 NOTE — Consult Note (Signed)
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 Continue ancef 2. Agree with removal of port 3. Will check TTE 4. Would not check TEE given overall co-morbidities.  5. Repeat BCx in AM 6. Agree with holding chemo 7. Watch ANC (he is past nadir period from his chemo) 8. Watch for s/s of pneumonia (he had known effusion on his CT 11-13-16) 9. If s/s of pneumonia, would repeat his CT scan 10. We will rescreen him for hepatitis and HIV  Thank you so much for this interesting consult,  Principal Problem:   Sepsis due to pneumonia [facility]) Active Problems:   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[place])   Dysphagia   Anemia associated with chemotherapy   Fall at home, initial encounter   . chlorhexidine  15 mL Mouth Rinse BID  . enoxaparin (LOVENOX) injection  40 mg Subcutaneous Q24H  . feeding supplement (OSMOLITE 1.5 CAL)  237 mL Per Tube 6 X Daily  . free water  100 mL Per Tube Q4H  . gabapentin  300 mg Per Tube TID  . mouth rinse  15 mL Mouth Rinse q12n4p  . senna-docusate  1 tablet Oral BID    HPI: Antonio [name] is a 61 y.o. 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  He was adm at that time due to hyperCa treated with lasix, fluids and calictonin.  Last dose of CTX (taxol-carboplatinin) 11-29-16.  He returns on 10-22 with body aches, weakness and a pre-syncopal episode. In ED he was found to have temp 103 and was tachycardic. His  WBC was 3.7 ([place] 3200) and his CXR showed: Right base collapse/ consolidation with small right pleural effusion. Aspiration and/ or infection could cause this appearance. Given the history of multiple pulmonary nodule seen on recent CT scan, malignant effusion also consideration.  He was started on vanco/zosyn.    Review of Systems: Review of Systems  Constitutional: Negative for chills and fever.  HENT: Positive for sore throat.   Respiratory: Negative for cough and shortness of breath.   Gastrointestinal: Negative for constipation and diarrhea.  Genitourinary: Negative for dysuria.  Musculoskeletal: Positive for back pain. Negative for neck pain.  Neurological: Negative for headaches.  Psychiatric/Behavioral: The patient does not have insomnia.   Please see HPI. All other systems reviewed and negative.   Past Medical History:  Diagnosis Date  . Allergy    allergic rhinitis  . Arthritis    "qwhere" (05/17/2016)  . Chronic cough   . Chronic sinus infection   . Colonic polyp   . COPD (chronic obstructive pulmonary disease) ([place])   . 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 Dysphagia    normal EGD except for mild gastritis in 01/2006  . GERD (gastroesophageal reflux disease)   . Head and neck malignancy ([place]) 05/18/2016  . High cholesterol   .  History of radiation therapy 06/28/16- 08/17/16   Larynx 70 Gy in 35 fractions to gross disease, 63 Gy in 35 fractions to high risk nodal echelons, and 56 Gy in 35 fractions to intermediate risk nodal echelons  . History of radiation therapy 06/28/16- 08/17/16   Right Lung 60 Gy in 5 fractions--- given at same time as Larynx radiation  . Hypertension   . Rectal bleeding    colonoscopy in 01/2006  . Rectal polyp    01/2006 with path showing it to be benign, though with a question that it was incomplete biopsy, next scheduled coloscopy 2010  . Sickle cell trait ([place])   . Tobacco abuse   . Varus deformity of knee     bilateral, congenital    Social History  Substance Use Topics  . Smoking status: Former Smoker    Packs/day: 0.12    Years: 43.00    Types: Cigarettes  . Smokeless tobacco: Never Used     Comment: cuting back  . Alcohol use 14.4 oz/week    24 Cans of beer per week     Comment: he tells me he has decreased. He is drinking 6-8 cans of beer a week.     Family History  Problem Relation Age of Onset  . Cancer Mother        gastric ca  . Hypertension Father   . Diabetes Father   . Diabetes Sister   . CAD Sister      Medications:  Scheduled: . chlorhexidine  15 mL Mouth Rinse BID  . enoxaparin (LOVENOX) injection  40 mg Subcutaneous Q24H  . feeding supplement (OSMOLITE 1.5 CAL)  237 mL Per Tube 6 X Daily  . free water  100 mL Per Tube Q4H  . gabapentin  300 mg Per Tube TID  . mouth rinse  15 mL Mouth Rinse q12n4p  . senna-docusate  1 tablet Oral BID    Abtx:  Anti-infectives    Start     Dose/Rate Route Frequency Ordered Stop   12/06/16 1000  vancomycin (VANCOCIN) IVPB 750 mg/150 ml premix  Status:  Discontinued     750 mg 150 mL/hr over 60 Minutes Intravenous Every 12 hours 12/05/16 0008 12/05/16 0009   12/05/16 1800  ceFAZolin (ANCEF) IVPB 2g/100 mL premix     2 g 200 mL/hr over 30 Minutes Intravenous Every 8 hours 12/05/16 1122     12/05/16 1000  vancomycin (VANCOCIN) IVPB 750 mg/150 ml premix  Status:  Discontinued     750 mg 150 mL/hr over 60 Minutes Intravenous Every 12 hours 12/05/16 0009 12/05/16 1118   12/05/16 0200  ceFEPIme (MAXIPIME) 1 g in dextrose 5 % 50 mL IVPB  Status:  Discontinued     1 g 100 mL/hr over 30 Minutes Intravenous Every 8 hours 12/05/16 0009 12/05/16 1118   12/07/2016 1930  vancomycin (VANCOCIN) IVPB 1000 mg/200 mL premix     1,000 mg 200 mL/hr over 60 Minutes Intravenous  Once 11/20/2016 1915 12/02/2016 2238   12/13/2016 1930  piperacillin-tazobactam (ZOSYN) IVPB 3.375 g     3.375 g 100 mL/hr over 30 Minutes Intravenous  Once 12/10/2016 1915  11/15/2016 2001        OBJECTIVE: Blood pressure 116/76, pulse (!) 129, temperature 98.9 F (37.2 C), temperature source Oral, resp. rate 18, height '5\' 3"'$  (1.6 m), weight 54.7 kg (120 lb 9.5 oz), SpO2 90 %.  Physical Exam  Constitutional: He is well-developed, well-nourished, and in no distress.  HENT:  Mouth/Throat: No oropharyngeal exudate.  Neck: Neck supple.    Cardiovascular: Normal rate, regular rhythm and normal heart sounds.   Pulmonary/Chest: Effort normal and breath sounds normal.    Abdominal: Soft. Bowel sounds are normal. There is no tenderness. There is no rebound.  Genitourinary:  Genitourinary Comments: PEG site is clean, no tenderness, no gross discharge.  Musculoskeletal: He exhibits no edema.  Psychiatric: Mood and affect normal.    Lab Results Results for orders placed or performed during the hospital encounter of 11/28/2016 (from the past 48 hour(s))  Comprehensive metabolic panel     Status: Abnormal   Collection Time: 11/24/2016  6:37 PM  Result Value Ref Range   Sodium 137 135 - 145 mmol/L   Potassium 3.7 3.5 - 5.1 mmol/L   Chloride 98 (L) 101 - 111 mmol/L   CO2 29 22 - 32 mmol/L   Glucose, Bld 135 (H) 65 - 99 mg/dL   BUN 23 (H) 6 - 20 mg/dL   Creatinine, Ser 1.00 0.61 - 1.24 mg/dL   Calcium 8.6 (L) 8.9 - 10.3 mg/dL   Total Protein 6.3 (L) 6.5 - 8.1 g/dL   Albumin 2.5 (L) 3.5 - 5.0 g/dL   AST 38 15 - 41 U/L   ALT 57 17 - 63 U/L   Alkaline Phosphatase 91 38 - 126 U/L   Total Bilirubin 1.3 (H) 0.3 - 1.2 mg/dL   GFR calc non Af Amer >60 >60 mL/min   GFR calc Af Amer >60 >60 mL/min    Comment: (NOTE) The eGFR has been calculated using the CKD EPI equation. This calculation has not been validated in all clinical situations. eGFR's persistently <60 mL/min signify possible Chronic Kidney Disease.    Anion gap 10 5 - 15  CBC with Differential     Status: Abnormal   Collection Time: 11/30/2016  6:37 PM  Result Value Ref Range   WBC 3.7 (L) 4.0 - 10.5  K/uL   RBC 3.06 (L) 4.22 - 5.81 MIL/uL   Hemoglobin 8.2 (L) 13.0 - 17.0 g/dL   HCT 23.5 (L) 39.0 - 52.0 %   MCV 76.8 (L) 78.0 - 100.0 fL   MCH 26.8 26.0 - 34.0 pg   MCHC 34.9 30.0 - 36.0 g/dL   RDW 16.4 (H) 11.5 - 15.5 %   Platelets 184 150 - 400 K/uL   Neutrophils Relative % 86 %   Lymphocytes Relative 6 %   Monocytes Relative 8 %   Eosinophils Relative 0 %   Basophils Relative 0 %   Neutro Abs 3.2 1.7 - 7.7 K/uL   Lymphs Abs 0.2 (L) 0.7 - 4.0 K/uL   Monocytes Absolute 0.3 0.1 - 1.0 K/uL   Eosinophils Absolute 0.0 0.0 - 0.7 K/uL   Basophils Absolute 0.0 0.0 - 0.1 K/uL   Smear Review MORPHOLOGY UNREMARKABLE   Protime-INR     Status: Abnormal   Collection Time: 12/02/2016  6:37 PM  Result Value Ref Range   Prothrombin Time 16.0 (H) 11.4 - 15.2 seconds   INR 1.29   Culture, blood (Routine x 2)     Status: None (Preliminary result)   Collection Time: 11/13/2016  6:37 PM  Result Value Ref Range   Specimen Description BLOOD RIGHT ANTECUBITAL    Special Requests      BOTTLES DRAWN AEROBIC AND ANAEROBIC Blood Culture adequate volume   Culture  Setup Time      GRAM POSITIVE COCCI IN BOTH AEROBIC AND ANAEROBIC BOTTLES Organism ID  to follow CRITICAL RESULT CALLED TO, READ BACK BY AND VERIFIED WITH: M. Derrick Ravel Pharm.D. 10:55 12/05/16 ([email or web address])    Culture GRAM POSITIVE COCCI    Report Status PENDING   Blood Culture ID Panel (Reflexed)     Status: Abnormal   Collection Time: 11/16/2016  6:37 PM  Result Value Ref Range   Enterococcus species NOT DETECTED NOT DETECTED   Listeria monocytogenes NOT DETECTED NOT DETECTED   Staphylococcus species DETECTED (A) NOT DETECTED    Comment: CRITICAL RESULT CALLED TO, READ BACK BY AND VERIFIED WITH: M. Derrick Ravel Pharm.D. 10:55 12/05/16 ([email or web address])    Staphylococcus aureus DETECTED (A) NOT DETECTED    Comment: Methicillin (oxacillin) susceptible Staphylococcus aureus (MSSA). Preferred therapy is anti staphylococcal beta lactam antibiotic (Cefazolin or  Nafcillin), unless clinically contraindicated. CRITICAL RESULT CALLED TO, READ BACK BY AND VERIFIED WITH: M. Derrick Ravel Pharm.D. 10:55 12/05/16 ([email or web address])    Methicillin resistance NOT DETECTED NOT DETECTED   Streptococcus species NOT DETECTED NOT DETECTED   Streptococcus agalactiae NOT DETECTED NOT DETECTED   Streptococcus pneumoniae NOT DETECTED NOT DETECTED   Streptococcus pyogenes NOT DETECTED NOT DETECTED   Acinetobacter baumannii NOT DETECTED NOT DETECTED   Enterobacteriaceae species NOT DETECTED NOT DETECTED   Enterobacter cloacae complex NOT DETECTED NOT DETECTED   Escherichia coli NOT DETECTED NOT DETECTED   Klebsiella oxytoca NOT DETECTED NOT DETECTED   Klebsiella pneumoniae NOT DETECTED NOT DETECTED   Proteus species NOT DETECTED NOT DETECTED   Serratia marcescens NOT DETECTED NOT DETECTED   Haemophilus influenzae NOT DETECTED NOT DETECTED   Neisseria meningitidis NOT DETECTED NOT DETECTED   Pseudomonas aeruginosa NOT DETECTED NOT DETECTED   Candida albicans NOT DETECTED NOT DETECTED   Candida glabrata NOT DETECTED NOT DETECTED   Candida krusei NOT DETECTED NOT DETECTED   Candida parapsilosis NOT DETECTED NOT DETECTED   Candida tropicalis NOT DETECTED NOT DETECTED    Comment: Performed at [facility], [address] [place], [place] [address]  Culture, blood (Routine x 2)     Status: None (Preliminary result)   Collection Time: 11/13/2016  6:40 PM  Result Value Ref Range   Specimen Description BLOOD RIGHT FOREARM    Special Requests      BOTTLES DRAWN AEROBIC AND ANAEROBIC Blood Culture adequate volume   Culture  Setup Time      GRAM POSITIVE COCCI IN BOTH AEROBIC AND ANAEROBIC BOTTLES CRITICAL VALUE NOTED.  VALUE IS CONSISTENT WITH PREVIOUSLY REPORTED AND CALLED VALUE. Performed at [facility], [address], [place], [place] [address]    Culture GRAM POSITIVE COCCI    Report Status PENDING   Urinalysis, Routine w reflex microscopic     Status: None    Collection Time: 11/20/2016  6:47 PM  Result Value Ref Range   Color, Urine YELLOW YELLOW   APPearance CLEAR CLEAR   Specific Gravity, Urine 1.013 1.005 - 1.030   pH 5.0 5.0 - 8.0   Glucose, UA NEGATIVE NEGATIVE mg/dL   Hgb urine dipstick NEGATIVE NEGATIVE   Bilirubin Urine NEGATIVE NEGATIVE   Ketones, ur NEGATIVE NEGATIVE mg/dL   Protein, ur NEGATIVE NEGATIVE mg/dL   Nitrite NEGATIVE NEGATIVE   Leukocytes, UA NEGATIVE NEGATIVE  I-Stat CG4 Lactic Acid, ED     Status: None   Collection Time: 11/23/2016  6:51 PM  Result Value Ref Range   Lactic Acid, Venous 1.58 0.5 - 1.9 mmol/L  Troponin I     Status: Abnormal   Collection Time: 11/24/2016  7:24  PM  Result Value Ref Range   Troponin I 0.03 (HH) <0.03 ng/mL    Comment: CRITICAL RESULT CALLED TO, READ BACK BY AND VERIFIED WITH: HANKS,K AT 2158 ON 12/08/2016 BY MOSLEY,J   I-Stat CG4 Lactic Acid, ED  (not at  [facility])     Status: None   Collection Time: 12/03/2016  8:57 PM  Result Value Ref Range   Lactic Acid, Venous 1.70 0.5 - 1.9 mmol/L  I-Stat CG4 Lactic Acid, ED     Status: Abnormal   Collection Time: 12/07/2016 10:01 PM  Result Value Ref Range   Lactic Acid, Venous 1.92 (H) 0.5 - 1.9 mmol/L  Strep pneumoniae urinary antigen     Status: None   Collection Time: 11/13/2016 11:37 PM  Result Value Ref Range   Strep Pneumo Urinary Antigen NEGATIVE NEGATIVE    Comment:        Infection due to S. pneumoniae cannot be absolutely ruled out since the antigen present may be below the detection limit of the test.   Culture, sputum-assessment     Status: None   Collection Time: 11/25/2016 11:40 PM  Result Value Ref Range   Specimen Description SPUTUM    Special Requests Immunocompromised    Sputum evaluation THIS SPECIMEN IS ACCEPTABLE FOR SPUTUM CULTURE    Report Status 12/05/2016 FINAL   Culture, respiratory (NON-Expectorated)     Status: None (Preliminary result)   Collection Time: 11/20/2016 11:40 PM  Result Value Ref Range   Specimen  Description SPUTUM    Special Requests Immunocompromised Reflexed from [id number]    Gram Stain      RARE WBC PRESENT,BOTH PMN AND MONONUCLEAR ABUNDANT SQUAMOUS EPITHELIAL CELLS PRESENT MODERATE GRAM POSITIVE COCCI MODERATE GRAM POSITIVE RODS MODERATE GRAM NEGATIVE RODS Performed at [facility], [place] [address] [place], [place] [address]    Culture PENDING    Report Status PENDING   Lactic acid, plasma     Status: None   Collection Time: 12/05/16 12:37 AM  Result Value Ref Range   Lactic Acid, Venous 1.2 0.5 - 1.9 mmol/L  Procalcitonin     Status: None   Collection Time: 12/05/16 12:37 AM  Result Value Ref Range   Procalcitonin 10.53 ng/mL    Comment:        Interpretation: PCT >= 10 ng/mL: Important systemic inflammatory response, almost exclusively due to severe bacterial sepsis or septic shock. (NOTE)         ICU PCT Algorithm               Non ICU PCT Algorithm    ----------------------------     ------------------------------         PCT < 0.25 ng/mL                 PCT < 0.1 ng/mL     Stopping of antibiotics            Stopping of antibiotics       strongly encouraged.               strongly encouraged.    ----------------------------     ------------------------------       PCT level decrease by               PCT < 0.25 ng/mL       >= 80% from peak PCT       OR PCT 0.25 - 0.5 ng/mL          Stopping of  antibiotics                                             encouraged.     Stopping of antibiotics           encouraged.    ----------------------------     ------------------------------       PCT level decrease by              PCT >= 0.25 ng/mL       < 80% from peak PCT        AND PCT >= 0.5 ng/mL             Continuing antibiotics                                              encouraged.       Continuing antibiotics            encouraged.    ----------------------------     ------------------------------     PCT level increase compared          PCT > 0.5 ng/mL          with peak PCT AND          PCT >= 0.5 ng/mL             Escalation of antibiotics                                          strongly encouraged.      Escalation of antibiotics        strongly encouraged.   Troponin I (q 6hr x 3)     Status: Abnormal   Collection Time: 12/05/16 12:37 AM  Result Value Ref Range   Troponin I 0.03 (HH) <0.03 ng/mL    Comment: CRITICAL VALUE NOTED.  VALUE IS CONSISTENT WITH PREVIOUSLY REPORTED AND CALLED VALUE.  Influenza panel by PCR (type A & B)     Status: None   Collection Time: 12/05/16  1:45 AM  Result Value Ref Range   Influenza A By PCR NEGATIVE NEGATIVE   Influenza B By PCR NEGATIVE NEGATIVE    Comment: (NOTE) The Xpert Xpress Flu assay is intended as an aid in the diagnosis of  influenza and should not be used as a sole basis for treatment.  This  assay is FDA approved for nasopharyngeal swab specimens only. Nasal  washings and aspirates are unacceptable for Xpert Xpress Flu testing.   Lactic acid, plasma     Status: None   Collection Time: 12/05/16  2:44 AM  Result Value Ref Range   Lactic Acid, Venous 1.2 0.5 - 1.9 mmol/L  CBC     Status: Abnormal   Collection Time: 12/05/16  2:44 AM  Result Value Ref Range   WBC 2.8 (L) 4.0 - 10.5 K/uL   RBC 2.78 (L) 4.22 - 5.81 MIL/uL   Hemoglobin 7.4 (L) 13.0 - 17.0 g/dL   HCT 21.6 (L) 39.0 - 52.0 %   MCV 77.7 (L) 78.0 - 100.0 fL   MCH 26.6 26.0 - 34.0 pg   MCHC 34.3 30.0 - 36.0  g/dL   RDW 16.3 (H) 11.5 - 15.5 %   Platelets 139 (L) 150 - 400 K/uL  Basic metabolic panel     Status: Abnormal   Collection Time: 12/05/16  2:44 AM  Result Value Ref Range   Sodium 138 135 - 145 mmol/L   Potassium 3.5 3.5 - 5.1 mmol/L   Chloride 103 101 - 111 mmol/L   CO2 26 22 - 32 mmol/L   Glucose, Bld 130 (H) 65 - 99 mg/dL   BUN 23 (H) 6 - 20 mg/dL   Creatinine, Ser 0.91 0.61 - 1.24 mg/dL   Calcium 7.7 (L) 8.9 - 10.3 mg/dL   GFR calc non Af Amer >60 >60 mL/min   GFR calc Af Amer >60 >60 mL/min     Comment: (NOTE) The eGFR has been calculated using the CKD EPI equation. This calculation has not been validated in all clinical situations. eGFR's persistently <60 mL/min signify possible Chronic Kidney Disease.    Anion gap 9 5 - 15  Troponin I (q 6hr x 3)     Status: None   Collection Time: 12/05/16  2:44 AM  Result Value Ref Range   Troponin I <0.03 <0.03 ng/mL  ABO/Rh     Status: None   Collection Time: 12/05/16  7:38 AM  Result Value Ref Range   ABO/RH(D) O POS   Type and screen [place]     Status: None (Preliminary result)   Collection Time: 12/05/16  7:59 AM  Result Value Ref Range   ABO/RH(D) O POS    Antibody Screen NEG    Sample Expiration 12/08/2016    Unit Number [id number]    Blood Component Type RCLI PHER 1    Unit division 00    Status of Unit ALLOCATED    Transfusion Status OK TO TRANSFUSE    Crossmatch Result Compatible   Prepare RBC     Status: None   Collection Time: 12/05/16  7:59 AM  Result Value Ref Range   Order Confirmation ORDER PROCESSED BY BLOOD BANK       Component Value Date/Time   SDES SPUTUM 11/19/2016 2340   SDES SPUTUM 11/25/2016 2340   SPECREQUEST Immunocompromised 12/09/2016 2340   SPECREQUEST Immunocompromised Reflexed from [id number] 12/03/2016 2340   CULT PENDING 12/09/2016 2340   REPTSTATUS 12/05/2016 FINAL 11/21/2016 2340   REPTSTATUS PENDING 12/01/2016 2340   Dg Chest 2 View  Result Date: 11/27/2016 CLINICAL DATA:  Generalized pain EXAM: CHEST  2 VIEW COMPARISON:  06/23/2005 FINDINGS: Asymmetric elevation right hemidiaphragm with right base collapse/consolidation and small right pleural effusion. The cardiopericardial silhouette is within normal limits for size. Right Port-A-Cath tip projects distal SVC level. Gastrostomy tube overlies the left upper quadrant. Degenerative changes are noted in the right shoulder. IMPRESSION: Right base collapse/ consolidation with small right pleural effusion. Aspiration and/  or infection could cause this appearance. Given the history of multiple pulmonary nodule seen on recent CT scan, malignant effusion also consideration. Electronically Signed   By: Misty Stanley M.D.   On: 12/01/2016 20:14   Recent Results (from the past 240 hour(s))  Culture, blood (Routine x 2)     Status: None (Preliminary result)   Collection Time: 11/20/2016  6:37 PM  Result Value Ref Range Status   Specimen Description BLOOD RIGHT ANTECUBITAL  Final   Special Requests   Final    BOTTLES DRAWN AEROBIC AND ANAEROBIC Blood Culture adequate volume   Culture  Setup Time   Final  GRAM POSITIVE COCCI IN BOTH AEROBIC AND ANAEROBIC BOTTLES Organism ID to follow CRITICAL RESULT CALLED TO, READ BACK BY AND VERIFIED WITH: M. Lear Ng Pharm.D. 10:55 12/05/16 ([email or web address])    Culture GRAM POSITIVE COCCI  Final   Report Status PENDING  Incomplete  Blood Culture ID Panel (Reflexed)     Status: Abnormal   Collection Time: 11/15/2016  6:37 PM  Result Value Ref Range Status   Enterococcus species NOT DETECTED NOT DETECTED Final   Listeria monocytogenes NOT DETECTED NOT DETECTED Final   Staphylococcus species DETECTED (A) NOT DETECTED Final    Comment: CRITICAL RESULT CALLED TO, READ BACK BY AND VERIFIED WITH: M. Chanetta Marshall.D. 10:55 12/05/16 ([email or web address])    Staphylococcus aureus DETECTED (A) NOT DETECTED Final    Comment: Methicillin (oxacillin) susceptible Staphylococcus aureus (MSSA). Preferred therapy is anti staphylococcal beta lactam antibiotic (Cefazolin or Nafcillin), unless clinically contraindicated. CRITICAL RESULT CALLED TO, READ BACK BY AND VERIFIED WITH: M. Lear Ng Pharm.D. 10:55 12/05/16 ([email or web address])    Methicillin resistance NOT DETECTED NOT DETECTED Final   Streptococcus species NOT DETECTED NOT DETECTED Final   Streptococcus agalactiae NOT DETECTED NOT DETECTED Final   Streptococcus pneumoniae NOT DETECTED NOT DETECTED Final   Streptococcus pyogenes NOT DETECTED NOT DETECTED Final   Acinetobacter  baumannii NOT DETECTED NOT DETECTED Final   Enterobacteriaceae species NOT DETECTED NOT DETECTED Final   Enterobacter cloacae complex NOT DETECTED NOT DETECTED Final   Escherichia coli NOT DETECTED NOT DETECTED Final   Klebsiella oxytoca NOT DETECTED NOT DETECTED Final   Klebsiella pneumoniae NOT DETECTED NOT DETECTED Final   Proteus species NOT DETECTED NOT DETECTED Final   Serratia marcescens NOT DETECTED NOT DETECTED Final   Haemophilus influenzae NOT DETECTED NOT DETECTED Final   Neisseria meningitidis NOT DETECTED NOT DETECTED Final   Pseudomonas aeruginosa NOT DETECTED NOT DETECTED Final   Candida albicans NOT DETECTED NOT DETECTED Final   Candida glabrata NOT DETECTED NOT DETECTED Final   Candida krusei NOT DETECTED NOT DETECTED Final   Candida parapsilosis NOT DETECTED NOT DETECTED Final   Candida tropicalis NOT DETECTED NOT DETECTED Final    Comment: Performed at [facility], [address], [place], [place] [address]  Culture, blood (Routine x 2)     Status: None (Preliminary result)   Collection Time: 11/19/2016  6:40 PM  Result Value Ref Range Status   Specimen Description BLOOD RIGHT FOREARM  Final   Special Requests   Final    BOTTLES DRAWN AEROBIC AND ANAEROBIC Blood Culture adequate volume   Culture  Setup Time   Final    GRAM POSITIVE COCCI IN BOTH AEROBIC AND ANAEROBIC BOTTLES CRITICAL VALUE NOTED.  VALUE IS CONSISTENT WITH PREVIOUSLY REPORTED AND CALLED VALUE. Performed at [facility], [place] [address] [place], [place] [address]    Culture GRAM POSITIVE COCCI  Final   Report Status PENDING  Incomplete  Culture, sputum-assessment     Status: None   Collection Time: 12/10/2016 11:40 PM  Result Value Ref Range Status   Specimen Description SPUTUM  Final   Special Requests Immunocompromised  Final   Sputum evaluation THIS SPECIMEN IS ACCEPTABLE FOR SPUTUM CULTURE  Final   Report Status 12/05/2016 FINAL  Final  Culture, respiratory (NON-Expectorated)      Status: None (Preliminary result)   Collection Time: 12/08/2016 11:40 PM  Result Value Ref Range Status   Specimen Description SPUTUM  Final   Special Requests Immunocompromised Reflexed from [id number]  Final   Gram Stain  Final    RARE WBC PRESENT,BOTH PMN AND MONONUCLEAR ABUNDANT SQUAMOUS EPITHELIAL CELLS PRESENT MODERATE GRAM POSITIVE COCCI MODERATE GRAM POSITIVE RODS MODERATE GRAM NEGATIVE RODS Performed at [facility], [place] [address] [place], [place] [address]    Culture PENDING  Incomplete   Report Status PENDING  Incomplete    Microbiology: Recent Results (from the past 240 hour(s))  Culture, blood (Routine x 2)     Status: None (Preliminary result)   Collection Time: 11/17/2016  6:37 PM  Result Value Ref Range Status   Specimen Description BLOOD RIGHT ANTECUBITAL  Final   Special Requests   Final    BOTTLES DRAWN AEROBIC AND ANAEROBIC Blood Culture adequate volume   Culture  Setup Time   Final    GRAM POSITIVE COCCI IN BOTH AEROBIC AND ANAEROBIC BOTTLES Organism ID to follow CRITICAL RESULT CALLED TO, READ BACK BY AND VERIFIED WITH: M. Chanetta Marshall.D. 10:55 12/05/16 ([email or web address])    Culture GRAM POSITIVE COCCI  Final   Report Status PENDING  Incomplete  Blood Culture ID Panel (Reflexed)     Status: Abnormal   Collection Time: 11/19/2016  6:37 PM  Result Value Ref Range Status   Enterococcus species NOT DETECTED NOT DETECTED Final   Listeria monocytogenes NOT DETECTED NOT DETECTED Final   Staphylococcus species DETECTED (A) NOT DETECTED Final    Comment: CRITICAL RESULT CALLED TO, READ BACK BY AND VERIFIED WITH: M. Chanetta Marshall.D. 10:55 12/05/16 ([email or web address])    Staphylococcus aureus DETECTED (A) NOT DETECTED Final    Comment: Methicillin (oxacillin) susceptible Staphylococcus aureus (MSSA). Preferred therapy is anti staphylococcal beta lactam antibiotic (Cefazolin or Nafcillin), unless clinically contraindicated. CRITICAL RESULT CALLED TO, READ BACK BY AND VERIFIED WITH: M.  Lear Ng Pharm.D. 10:55 12/05/16 ([email or web address])    Methicillin resistance NOT DETECTED NOT DETECTED Final   Streptococcus species NOT DETECTED NOT DETECTED Final   Streptococcus agalactiae NOT DETECTED NOT DETECTED Final   Streptococcus pneumoniae NOT DETECTED NOT DETECTED Final   Streptococcus pyogenes NOT DETECTED NOT DETECTED Final   Acinetobacter baumannii NOT DETECTED NOT DETECTED Final   Enterobacteriaceae species NOT DETECTED NOT DETECTED Final   Enterobacter cloacae complex NOT DETECTED NOT DETECTED Final   Escherichia coli NOT DETECTED NOT DETECTED Final   Klebsiella oxytoca NOT DETECTED NOT DETECTED Final   Klebsiella pneumoniae NOT DETECTED NOT DETECTED Final   Proteus species NOT DETECTED NOT DETECTED Final   Serratia marcescens NOT DETECTED NOT DETECTED Final   Haemophilus influenzae NOT DETECTED NOT DETECTED Final   Neisseria meningitidis NOT DETECTED NOT DETECTED Final   Pseudomonas aeruginosa NOT DETECTED NOT DETECTED Final   Candida albicans NOT DETECTED NOT DETECTED Final   Candida glabrata NOT DETECTED NOT DETECTED Final   Candida krusei NOT DETECTED NOT DETECTED Final   Candida parapsilosis NOT DETECTED NOT DETECTED Final   Candida tropicalis NOT DETECTED NOT DETECTED Final    Comment: Performed at [facility], [address], [place], [place] [address]  Culture, blood (Routine x 2)     Status: None (Preliminary result)   Collection Time: 11/18/2016  6:40 PM  Result Value Ref Range Status   Specimen Description BLOOD RIGHT FOREARM  Final   Special Requests   Final    BOTTLES DRAWN AEROBIC AND ANAEROBIC Blood Culture adequate volume   Culture  Setup Time   Final    GRAM POSITIVE COCCI IN BOTH AEROBIC AND ANAEROBIC BOTTLES CRITICAL VALUE NOTED.  VALUE IS CONSISTENT WITH PREVIOUSLY REPORTED AND CALLED VALUE.  Performed at [facility], [place] [address] [place], [place] [address]    Culture GRAM POSITIVE COCCI  Final   Report Status PENDING  Incomplete  Culture,  sputum-assessment     Status: None   Collection Time: 12/06/2016 11:40 PM  Result Value Ref Range Status   Specimen Description SPUTUM  Final   Special Requests Immunocompromised  Final   Sputum evaluation THIS SPECIMEN IS ACCEPTABLE FOR SPUTUM CULTURE  Final   Report Status 12/05/2016 FINAL  Final  Culture, respiratory (NON-Expectorated)     Status: None (Preliminary result)   Collection Time: 11/19/2016 11:40 PM  Result Value Ref Range Status   Specimen Description SPUTUM  Final   Special Requests Immunocompromised Reflexed from [id number]  Final   Gram Stain   Final    RARE WBC PRESENT,BOTH PMN AND MONONUCLEAR ABUNDANT SQUAMOUS EPITHELIAL CELLS PRESENT MODERATE GRAM POSITIVE COCCI MODERATE GRAM POSITIVE RODS MODERATE GRAM NEGATIVE RODS Performed at [facility], [place] [address] [place], [place] [address]    Culture PENDING  Incomplete   Report Status PENDING  Incomplete    Radiographs and labs were personally reviewed by me.        [facility] Staphylococcus aureus bacteremia   Staphylococcus aureus bacteremia (SAB) is associated with a high rate of complications and mortality.  Specific aspects of clinical management are critical to optimizing the outcome of patients with SAB.  Therefore, the [facility] Antimicrobial Management Team [facility]) has initiated an intervention aimed at improving the management of SAB at [facility].  To do so, Infectious Diseases physicians are providing an evidence-based consult for the management of all patients with SAB.     Yes No Comments  Perform follow-up blood cultures (even if the patient is afebrile) to ensure clearance of bacteremia '[x]'$  '[]'$    Remove vascular catheter and obtain follow-up blood cultures after the removal of the catheter '[x]'$  '[]'$    Perform echocardiography to evaluate for endocarditis (transthoracic ECHO is 40-50% sensitive, TEE is > 90% sensitive) '[x]'$  '[]'$  Please keep in mind, that neither test can  definitively EXCLUDE endocarditis, and that should clinical suspicion remain high for endocarditis the patient should then still be treated with an "endocarditis" duration of therapy = 6 weeks  Consult electrophysiologist to evaluate implanted cardiac device (pacemaker, ICD) '[]'$  '[]'$    Ensure source control '[x]'$  '[]'$  Have all abscesses been drained effectively? Have deep seeded infections (septic joints or osteomyelitis) had appropriate surgical debridement?  Investigate for "metastatic" sites of infection '[]'$  '[]'$  Does the patient have ANY symptom or physical exam finding that would suggest a deeper infection (back or neck pain that may be suggestive of vertebral osteomyelitis or epidural abscess, muscle pain that could be a symptom of pyomyositis)?  Keep in mind that for deep seeded infections MRI imaging with contrast is preferred rather than other often insensitive tests such as plain x-rays, especially early in a patient's presentation.  Change antibiotic therapy to __________________ '[x]'$  '[]'$  Beta-lactam antibiotics are preferred for MSSA due to higher cure rates.   If on Vancomycin, goal trough should be 15 - 20 mcg/mL  Estimated duration of IV antibiotic therapy:   '[]'$  '[]'$  Consult case management for probably prolonged outpatient IV antibiotic therapy     Bobby Rumpf, MD [facility] [place] [facility] [phone] 12/05/2016, 1:08 PM

## 2016-12-05 NOTE — Consult Note (Addendum)
   [facility] Inpatient Consult   12/05/2016  Antonio [name] 04-03-1955 [id number]    Antonio [name] is active with [facility] program.   Spoke with Antonio [name] at bedside. He is agreeable to ongoing [facility] follow up.  Antonio [name] reports he lives alone. Also states he has home health thru [place] and has a [facility] nurse that comes out as well.    Chart reviewed and noted that Antonio [name] had palliative consult during last hospitalization. However, it appears Antonio [name] could benefit from another goals of care conversation during this hospitalization due to his ongoing medical issues and hospitalizations.   Spoke with Dr. Bonner Puna to discuss potential goals of care consult.  Discussed all of the above with inpatient RNCM.   Appreciative of inpatient staff collaboration.    Marthenia Rolling, MSN-Ed, RN,BSN [facility] Liaison [phone]

## 2016-12-05 NOTE — Consult Note (Signed)
Name: Antonio [name] MRN: [id number] DOB: [birth year]    ADMISSION DATE:  12/03/2016 CONSULTATION DATE:  12/05/2016  REFERRING MD :  Dr. 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 s/p chemo (carboplatin/paclitaxel on 10/5), and radiation to Larynx and right lung (completed 08/17/2016) has been informed that prognosis is poor and chemo is strictly palliative in nature, COPD, Dysphagia s/p PEG, arthritis, sickle cell trait, HLD     Presents to ED on 10/22. Family reports that patient has had decreased appetite, weight loss, and multiple falls at home. Upon arrival to ED patient voices overall generalized and throat pain, Temp 103.1, HR 140, Tachypenic, with weak productive cough. CXR with right base collapse concerning for PNA vs Malignant pleural effusion. Given 3L NS and Zosyn/Vanomcyin. Admitted to Telemetry. 10/23 Blood Cultures positive for MSSA and antibiotics narrowed to Ancef. Plan to remove Port. ID and Oncology consulted. Later in evening on 10/23 patient noted to be increasingly lethargic with temp 220.2 and BP systolic 54-27. 1L NS Given and PCCM consulted.   Of note patient was hospitalized 10/1-10/7 for management of malignant hypercalcemia   On my exam patient is awake/alert. He denies SOB, CP, Wheezing, Abd pain, or Dysuria. He reports he had diarrhea 2 days ago which is now resolved. He admits to occasional cough productive of clear sputum. He admits to mild tenderness over his port but says it has been tender like that since the port was placed. His only real complaint tonight is of a very dry mouth.   SIGNIFICANT EVENTS  10/22 > Presents to ED  10/23 > Transferred to Step-Down   STUDIES:  Pathology 4/5 > Lymph node, needle core biopsy with squamous cell carcinoma with tumor invading skeletal muscle  PET  4/26 > 1. 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 2. Hypermetabolic right lower lobe pulmonary nodule is favored to represent a synchronous primary bronchogenic neoplasm. Isolated pulmonary metastasis could look similar. No thoracic nodal hypermetabolism. 3. Soft tissue hypermetabolism about the right iliac bone and greater trochanter of the proximal right femur are favored to be degenerative or posttraumatic. Soft tissue metastasis felt highly unlikely. 4.  Coronary artery atherosclerosis. Aortic atherosclerosis. 5. A tiny right lower lobe pulmonary nodule is most likely a subpleural lymph node. CT Chest 10/01 > 1. Interim development of multiple enlarged mediastinal and hilar lymph nodes consistent with metastatic disease. 2. Development of multiple bilateral pulmonary nodules, also suspicious for metastatic disease. Suspect that the previously noted right lower lobe lung mass has increased in size, now measuring about 26 mm compared with 19 mm previously. 3. New small right-sided pleural effusion. Patchy consolidations in the right lower lobe, uncertain if this represents pneumonia, aspiration, or metastatic nodules. 4. Subcentimeter hypodense foci in the liver, too small to further characterize. When the patient is clinically stable and able to follow directions and hold their breath (preferably as an outpatient) further evaluation with dedicated abdominal MRI may be considered. CXR 10/22 > Right base collapse/ consolidation with small right pleural effusion. Aspiration and/ or infection could cause this appearance. Given the history of multiple pulmonary nodule seen on recent CT scan, malignant effusion also consideration.  MICROBIOLOGY:  Blood 10/22 > Gram Positive Cocci in both aerobic and anaerobic bottles >> MSSA >>  Sputum 10/22 >  Rare WBC, Abundant Squamous epithelial cells present, moderate GPC, GPR,  GNR >> Group A Strep 10/23 >>     PAST MEDICAL HISTORY :   has a past medical history of Allergy; Arthritis; Chronic cough; Chronic sinus infection; Colonic polyp; COPD (chronic obstructive pulmonary disease) (HCC); DJD (degenerative joint disease) of knee; Dysphagia; GERD (gastroesophageal reflux disease); Head and neck malignancy ([place]) (05/18/2016); High cholesterol; History of radiation therapy (06/28/16- 08/17/16); History of radiation therapy (06/28/16- 08/17/16); Hypertension; Rectal bleeding; Rectal polyp; Sickle cell trait ([place]); Tobacco abuse; and Varus deformity of knee.  has a past surgical history that includes Colonoscopy w/ biopsies and polypectomy (X 2); Multiple tooth extractions (~ 2008); IR GASTROSTOMY TUBE MOD SED (06/23/2016); IR FLUORO GUIDE PORT INSERTION RIGHT (06/23/2016); and IR US Guide Vasc Access Right (06/23/2016). Prior to Admission medications   Medication Sig Start Date End Date Taking? Authorizing Provider  gabapentin (NEURONTIN) 300 MG capsule Take 1 capsule (300 mg total) by mouth 3 (three) times daily. 08/24/16  Yes Gorsuch, Ni, MD  lidocaine-prilocaine (EMLA) cream Apply to affected area once 06/14/16  Yes Gorsuch, Ni, MD  morphine (MSIR) 15 MG tablet Take 1 tablet (15 mg total) by mouth every 4 (four) hours as needed for severe pain. 11/23/16  Yes [name], MD  Nutritional Supplements (FEEDING SUPPLEMENT, OSMOLITE 1.5 CAL,) LIQD Begin Osmolite 1.5 via PEG TID with 60 mL free water. Advance slowly to  goal of 2 bottles QID with 60 mL free water before and after. In addition, drink or flush PEG with 240 mL free water TID. 11/19/16  Yes Florencia Reasons, MD  senna-docusate (SENOKOT-S) 8.6-50 MG tablet Take 1 tablet by mouth 2 (two) times daily. 06/14/16  Yes [name], MD  Water For Irrigation, Sterile (FREE WATER) SOLN Place 100 mLs into feeding tube every 4 (four) hours. 11/19/16  Yes Florencia Reasons, MD  ondansetron (ZOFRAN) 8 MG tablet Take 1 tablet (8 mg total) by mouth 2 (two) times  daily as needed. Start on the third day after chemotherapy. Patient not taking: Reported on 10/18/2016 06/14/16   [name], MD   No Known Allergies  FAMILY HISTORY:  family history includes CAD in his sister; Cancer in his mother; Diabetes in his father and sister; Hypertension in his father. SOCIAL HISTORY:  reports that he has quit smoking. His smoking use included Cigarettes. He has a 5.16 pack-year smoking history. He has never used smokeless tobacco. He reports that he drinks about 14.4 oz of alcohol per week . He reports that he uses drugs, including "Crack" cocaine and Marijuana.  REVIEW OF SYSTEMS:   All negative; except for those that are bolded, which indicate positives.  Constitutional: weight loss, weight gain, night sweats, fevers, chills, fatigue, weakness.  HEENT: Dry mouth. headaches, sore throat, sneezing, nasal congestion, post nasal drip, difficulty swallowing, tooth/dental problems, visual complaints, visual changes, ear aches. Neuro: difficulty with speech, weakness, numbness, ataxia. CV:  chest pain, orthopnea, PND, swelling in lower extremities, dizziness, palpitations, syncope.  Resp: cough (clear sputum), hemoptysis, dyspnea, wheezing. GI: heartburn, indigestion, abdominal pain, nausea, vomiting, diarrhea, constipation, change in bowel habits, loss of appetite, hematemesis, melena, hematochezia.  GU: dysuria, change in color of urine, urgency or frequency, flank pain, hematuria. MSK: joint pain or swelling, decreased range of motion. Psych: change in mood or affect, depression, anxiety, suicidal ideations, homicidal ideations. Skin: rash, itching, bruising.   VITAL SIGNS: Temp:  [98.9 F (37.2 C)-102.8 F (39.3 C)] 102.3 F (39.1 C) (10/23 1840) Pulse Rate:  [  101-145] 134 (10/23 1906) Resp:  [16-32] 18 (10/23 1840) BP: (80-130)/(42-76) 88/52 (10/23 1906) SpO2:  [90 %-99 %] 93 % (10/23 1840) Weight:  [54.7 kg (120 lb 9.5 oz)-58.1 kg (128 lb)] 54.7 kg (120 lb  9.5 oz) (10/23 0115)  PHYSICAL EXAMINATION: General: Thin middle-aged male, chronically-ill appearing, lying in hospital bed, very chatty and social, in NAD Neuro: AAOx3, Slurred garbled speech which family says is his baseline; Moving all extremities. Obeying commands. PERRL HEENT: OP clear, MM slightly dry Cardiovascular: Tachycardic to 110's with a regular rhythm; no m/r/g Lungs: Coarse breath sounds b/l; no accessory muscle use. Speaking in full sentences Abdomen: Soft flat NTND, PEG Musculoskeletal: Right upper chest port with mild TTP that patient says is chronic; no fluctuance or warmth; No LE edema  Skin: no rashes    Recent Labs Lab 11/29/16 1037 11/23/2016 1837 12/05/16 0244  NA 139 137 138  K 3.7 3.7 3.5  CL  --  98* 103  CO2 28 29 26   BUN 18.4 23* 23*  CREATININE 0.9 1.00 0.91  GLUCOSE 160* 135* 130*    Recent Labs Lab 11/29/16 1038 11/29/2016 1837 12/05/16 0244  HGB 8.9* 8.2* 7.4*  HCT 27.0* 23.5* 21.6*  WBC 9.4 3.7* 2.8*  PLT 227 184 139*   Dg Chest 2 View  Result Date: 11/27/2016 CLINICAL DATA:  Generalized pain EXAM: CHEST  2 VIEW COMPARISON:  06/23/2005 FINDINGS: Asymmetric elevation right hemidiaphragm with right base collapse/consolidation and small right pleural effusion. The cardiopericardial silhouette is within normal limits for size. Right Port-A-Cath tip projects distal SVC level. Gastrostomy tube overlies the left upper quadrant. Degenerative changes are noted in the right shoulder. IMPRESSION: Right base collapse/ consolidation with small right pleural effusion. Aspiration and/ or infection could cause this appearance. Given the history of multiple pulmonary nodule seen on recent CT scan, malignant effusion also consideration. Electronically Signed   By: Misty Stanley M.D.   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     1. Septic shock: due to MSSA Bacteremia; Possible HCAP Pneumonia CXR: on my review there is increased RLL consolidation/atelectasis compared to prior imaging. Unclear if this is all from his previously diagnosed RLL malignancy of if there could be infiltrate there as well.  Plan  -ID Following  -Trend WBC and Fever Curve  -Continue Vanc and Ancef  -blood cultures (10/22) growing 4/4 bottles MSSA. Repeat blood cultures now.   - Sputum gram stain (10/22): shows moderate GPC's/GPR's/GNR's; culture pending - Lactate had improved from 1.9 to 1.2 since admission; repeat lactate now. Procalcitonin 10.53; continue to trend -Tylenol PRN  -IR Consulted with Plans for Port Removal in AM  - Will order Chest CT to better characterize the RLL consolidation.  - patient received 3L IVF on admission, had BP 88/52 this evening and received another 1L NS after which SBP 100's. Will not start pressors at this time. Hopefully will not need pressors and can get port out to have a line holiday before placing new CVC or Port. Continue NS @ 100cc/hr -Cardiac Monitoring -Maintain MAP >23 or Systolic >53 ; if BP remains low start PIV NEO  -ECHO pending  -Cortisol pending  - start incentive spirometry and flutter  valve  2. H/O COPD  - duonebs PRN  3. Dysphagia s/p PEG s/p Radiation   Plan  -Continue TF  -Continue Free Water   4. Metastatic Squamous cell carcinoma to mediastinum, lung and liver with primary Supraglottic Stage IV CA s/p chemo (carboplatin/paclitaxel on 10/5), and radiation to Larynx and right lung (completed 08/17/2016) has been informed that prognosis is poor and chemo is strictly palliative in nature Plan  -Per Oncology  -Plans to Continue Holding Chemotherapy  -Consider consulting Palliative care in AM.   5.  Pancytopenia - WBC now 2.8 down form 9.4, likely due to the sepsis; continue to monitor - Hgb 7.4 down from 8.9; 1u pRBC is already ordered; Recheck Hgb post transfusion - Thrombocytopenia: with Platelets 129 down from 184 yesterday from baseline of 227. likely from the sepsis itself. Continue to monitor  6. Hypokalemia: - replete with KCL 40MEQ IV   60 minutes critical care time  Vernie Murders, MD Pulmonary & Critical Care Medicine Pager: [phone]

## 2016-12-05 NOTE — Progress Notes (Signed)
Antonio [name]   DOB:1955/06/14   PY#:[id number]   The patient is well-known to me. 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 LEFT neck lymphadenopathy including LEFT level IIa to level 3 4.7 x 4.6 x 7.6 cm necrotic nodal conglomeration with suspected extracapsular spread of disease. Moderately narrowed hypopharynx.       05/18/2016 Procedure    Technically successful ultrasound-guided core biopsy of left cervical adenopathy.       05/18/2016 Pathology Results    Lymph node, needle/core biopsy, L cerv LAN - SQUAMOUS CELL CARCINOMA, SEE COMMENT. Microscopic Comment Residual lymph node is not seen. There is tumor invading skeletal muscle. p16 weakly positive      06/06/2016 Procedure    He had dental extraction      06/08/2016 PET scan    1. 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 2. Hypermetabolic right lower lobe pulmonary nodule is favored to represent a synchronous primary bronchogenic neoplasm. Isolated pulmonary metastasis could look similar. No thoracic nodal hypermetabolism. 3. Soft tissue hypermetabolism about the right iliac bone and greater trochanter of the proximal right femur are favored to be degenerative or posttraumatic. Soft tissue metastasis felt highly unlikely. 4.  Coronary artery atherosclerosis. Aortic atherosclerosis. 5. A tiny right lower lobe pulmonary nodule is most likely a subpleural lymph node.      06/15/2016 Procedure    He has near normal baseline hearing test      06/23/2016 Procedure    Ultrasound and fluoroscopically guided right internal jugular single lumen power port catheter insertion. Tip in the SVC/RA  junction. Catheter ready for use      06/23/2016 Procedure    Fluoroscopic insertion of a 20-French "pull-through" gastrostomy.      06/28/2016 - 08/17/2016 Radiation Therapy    Radiation treatment dates:   06/28/16 - 08/17/16  Site/dose:     1) Larynx and neck / 70 Gy in 35 fractions to gross disease, 63 Gy in 35 fractions to high risk nodal echelons, and 56 Gy in 35 fractions to intermediate risk nodal echelons                         2) Right Lower Lung treated to 54 Gy in 3 fractions  Beams/energy:   1)  IMRT // 6 MV photons                              2) SBRT/SRT-VMAT // 6X-FFF        06/30/2016 - 08/11/2016 Chemotherapy    He received chemotherapy with high dose cisplatin      11/13/2016 Imaging    1. Interim development of multiple enlarged mediastinal and hilar lymph nodes consistent with metastatic disease. 2. Development of multiple bilateral pulmonary nodules, also suspicious for metastatic disease. Suspect that the previously noted right lower lobe lung mass has increased in size, now measuring about 26 mm compared with 19 mm previously. 3. New small right-sided pleural effusion. Patchy consolidations in the right lower lobe, uncertain if this represents pneumonia, aspiration, or metastatic nodules. 4. Subcentimeter hypodense foci in the liver, too small to  further characterize. When the patient is clinically stable and able to follow directions and hold their breath (preferably as an outpatient) further evaluation with dedicated abdominal MRI may be considered.  Aortic Atherosclerosis (ICD10-I70.0).      11/13/2016 - 11/19/2016 Hospital Admission    The patient was hospitalized for management of malignant hypercalcemia.  He was started on palliative chemotherapy on November 17, 2016      11/17/2016 -  Chemotherapy    He received combination chemotherapy with carboplatin and Taxol      He last received chemotherapy a week ago Subjective: This morning, the patient is asking  why he cannot eat or drink.  He was noted to have fevers but denies cough.  He is also noted to be tachycardic. Chest x-ray show possible infection/aspiration.  He was started on broad-spectrum antibiotics. He denies fever or chills.  He denies nausea or vomiting. His chronic throat pain remained about the same. He denies recent constipation or diarrhea Objective:  Vitals:   12/05/16 0704 12/05/16 0857  BP: 124/65 116/76  Pulse: (!) 114 (!) 101  Resp: 16 18  Temp: 99.1 F (37.3 C) 98.9 F (37.2 C)  SpO2: 95% 95%     Intake/Output Summary (Last 24 hours) at 12/05/16 1141 Last data filed at 12/05/16 0820  Gross per 24 hour  Intake              325 ml  Output              200 ml  Net              125 ml    GENERAL:alert, no distress and comfortable SKIN: skin color, texture, turgor are normal, no rashes or significant lesions EYES: normal, Conjunctiva are pink and non-injected, sclera clear OROPHARYNX:no exudate, no erythema and lips, buccal mucosa, and tongue normal  NECK: supple, thyroid normal size, non-tender, without nodularity LYMPH:  no palpable lymphadenopathy in the cervical, axillary or inguinal LUNGS: clear to auscultation and percussion with normal breathing effort HEART: regular rate & rhythm and no murmurs and no lower extremity edema ABDOMEN:abdomen soft, non-tender and normal bowel sounds Musculoskeletal:no cyanosis of digits and no clubbing  NEURO: alert & oriented x 3 with dysarthria, no focal motor/sensory deficits   Labs:  Lab Results  Component Value Date   WBC 2.8 (L) 12/05/2016   HGB 7.4 (L) 12/05/2016   HCT 21.6 (L) 12/05/2016   MCV 77.7 (L) 12/05/2016   PLT 139 (L) 12/05/2016   NEUTROABS 3.2 12/10/2016    Lab Results  Component Value Date   NA 138 12/05/2016   K 3.5 12/05/2016   CL 103 12/05/2016   CO2 26 12/05/2016    Studies:  Dg Chest 2 View  Result Date: 11/22/2016 CLINICAL DATA:  Generalized pain EXAM: CHEST  2 VIEW COMPARISON:   06/23/2005 FINDINGS: Asymmetric elevation right hemidiaphragm with right base collapse/consolidation and small right pleural effusion. The cardiopericardial silhouette is within normal limits for size. Right Port-A-Cath tip projects distal SVC level. Gastrostomy tube overlies the left upper quadrant. Degenerative changes are noted in the right shoulder. IMPRESSION: Right base collapse/ consolidation with small right pleural effusion. Aspiration and/ or infection could cause this appearance. Given the history of multiple pulmonary nodule seen on recent CT scan, malignant effusion also consideration. Electronically Signed   By: Misty Stanley M.D.   On: 12/13/2016 20:14   Influenza panel is negative Blood culture came back positive for MSSA  Assessment & Plan:  Metastatic supraglottic cancer with lung metastasis The patient presented with sepsis, likely secondary to bacteremia or pneumonia We will hold chemotherapy and provide supportive care for now  Acquired pancytopenia due to chemo The patient is symptomatic With recent chemotherapy, I suspect the pancytopenia will only get worse I would proceed to give him a unit of blood transfusion I will order a CBC with differential tomorrow If ANC is less than 1.5, consider G-CSF support  MSSA bacteremia I will consult IR for port removal Continue IV antibiotic therapy  Recent falls Consult PT  Moderate protein calorie malnutrition Resume tube feeding Will consult dietitian  Cancer associated pain He will continue to take morphine sulfate as needed  Discharge planning He is not ready to be discharged I will be away the next 3 days but I will get Dr. Malachy Mood to follow Please call consult service if needed   [name], MD 12/05/2016  11:41 AM

## 2016-12-05 NOTE — Progress Notes (Signed)
PROGRESS NOTE  Srijan Givan  AYT:[id number] DOB: 09/18/1955 DOA: 11/26/2016 PCP: Maryellen Pile, MD  Outpatient Specialists: Oncology, [name] Brief Narrative: Antonio [name] is a 61 y.o. male with a history of supraglottic laryngeal CA s/p resection, chemo/radiation currently on palliative chemotherapy who was brought from home by EMS due to weakness, a fall, and confusion which was reported by his sister. He reports he slipped and fell on urine and denies any confusion. He reported chills and a productive cough to admitting MD. He denied fevers but had a temperature of 103.47F, heart rates 134-141, respirations 18-32, blood pressures 117/57-122/66, O2 saturations 91-96% on room air. Labs revealed WBC 3.7, hemoglobin 8.2, MCV 76.8, RDW 16.4, platelets 184, BUN 23, creatinine 1, albumin 2.5, lactic acid trending upwards to 1.92. Chest x-ray showed right base collapse with small pleural effusion. Urinalysis was negative for any signs of infection. He was given 3L of normal saline, empiric antibiotics and admitted. Blood cultures have become positive for MSSA, so antibiotics are narrowed to ancef.   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 - ID, Dr. Johnnye Sima, consulted. Suspect will need to pull port.  - Vanc/cefepime > Ancef 10/23.  - Continue IVF. - Will need echo and repeat blood cultures.  - Oxygen prn, though from a respiratory standpoint he appears well compensated.   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  - Discussed with [name] who will be traveling the next few days but will have ongoing evaluation while pt is hospitalized.  - Continue home morphine for cancer-related pain,  which is stable.   Dysphagia: s/p PEG May 2018. - Continue feeds thru PEG tube. Ok to give liquid po for pt comfort.  Pancytopenia: Suspect due to marrow suppression.  - Will trend hemoglobin and transfuse if continues downward trend in AM, begins bleeding, or if becomes hypoxic/symptomatic.  - T&S performed  Fall with generalized weakness: suspect due to ongoing malignancy and chemotherapy worsened acutely by infection. - Physical therapy to evaluate.   History of hypercalcemia of malignancy: Admitted earlier Oct 2018.  - Resolved, will monitor.   Peripheral neuropathy: - Continue gabapentin   DVT prophylaxis: SCDs Code Status: Full Family Communication: Sister and Nephew at bedside this AM Disposition Plan: Needs prolonged IV antibiotics.   Consultants:   Oncology, [name].   Procedures:   Echocardiogram ordered  Antimicrobials:  Vancomycin 10/22 > 10/23  Zosyn 10/22  Cefepime 10/22 > 10/23   Ancef 10/23 >>   Subjective: Weakness is primary complaint, but says he's not clear on why he was brought to the hospital. He is oriented and denies having been confused, states his fall was due to slipping on urine on the floor. Denies trouble breathing. Feels better this morning. No chest pain.  Objective: Vitals:   12/05/16 0000 12/05/16 0115 12/05/16 0704 12/05/16 0857  BP: 99/61 103/68 124/65 116/76  Pulse: (!) 115 (!) 109 (!) 114 (!) 101  Resp: (!) 23 (!) 22 16 18   Temp:  98.9 F (37.2 C) 99.1 F (37.3 C) 98.9 F (37.2 C)  TempSrc:  Oral Oral Oral  SpO2: 96% 94% 95% 95%  Weight:  54.7 kg (120 lb 9.5 oz)    Height:  5\' 3"  (1.6 m)  Intake/Output Summary (Last 24 hours) at 12/05/16 1010 Last data filed at 12/05/16 0820  Gross per 24 hour  Intake              325 ml  Output              200 ml  Net              125 ml   Filed Weights   11/16/2016 1817 11/29/2016 2023 12/05/16 0115  Weight: 58.1 kg (128 lb) 58.1 kg (128 lb) 54.7 kg (120 lb 9.5 oz)     Gen: Chronically ill-appearing male in no distress. Hoarse voice.  Pulm: Non-labored tachypnea breathing room air. Decreased at right base. CV: Regular tachycardia. No murmur, rub, or gallop. No JVD, no pedal edema. GI: Abdomen soft, non-tender, non-distended, with normoactive bowel sounds. No organomegaly or masses felt. Ext: Warm, no deformities Skin: Port site without erythema, discharge or tenderness.  Neuro: Alert and oriented. No focal neurological deficits. Psych: Judgement and insight appear fair. Mood & affect appropriate.   Data Reviewed: I have personally reviewed following labs and imaging studies  CBC:  Recent Labs Lab 11/29/16 1038 12/02/2016 1837 12/05/16 0244  WBC 9.4 3.7* 2.8*  NEUTROABS 8.1* 3.2  --   HGB 8.9* 8.2* 7.4*  HCT 27.0* 23.5* 21.6*  MCV 80.8 76.8* 77.7*  PLT 227 184 948*   Basic Metabolic Panel:  Recent Labs Lab 11/29/16 1037 11/29/16 1038 11/15/2016 1837 12/05/16 0244  NA 139  --  137 138  K 3.7  --  3.7 3.5  CL  --   --  98* 103  CO2 28  --  29 26  GLUCOSE 160*  --  135* 130*  BUN 18.4  --  23* 23*  CREATININE 0.9  --  1.00 0.91  CALCIUM 9.6  --  8.6* 7.7*  MG 2.3  --   --   --   PHOS  --  2.6  --   --    GFR: Estimated Creatinine Clearance: 66.8 mL/min (by C-G formula based on SCr of 0.91 mg/dL). Liver Function Tests:  Recent Labs Lab 11/29/16 1037 12/09/2016 1837  AST 32 38  ALT 50 57  ALKPHOS 90 91  BILITOT 0.82 1.3*  PROT 6.6 6.3*  ALBUMIN 2.7* 2.5*   No results for input(s): LIPASE, AMYLASE in the last 168 hours. No results for input(s): AMMONIA in the last 168 hours. Coagulation Profile:  Recent Labs Lab 11/27/2016 1837  INR 1.29   Cardiac Enzymes:  Recent Labs Lab 12/11/2016 1924 12/05/16 0037 12/05/16 0244  TROPONINI 0.03* 0.03* <0.03   BNP (last 3 results) No results for input(s): PROBNP in the last 8760 hours. HbA1C: No results for input(s): HGBA1C in the last 72 hours. CBG: No results for input(s):  GLUCAP in the last 168 hours. Lipid Profile: No results for input(s): CHOL, HDL, LDLCALC, TRIG, CHOLHDL, LDLDIRECT in the last 72 hours. Thyroid Function Tests: No results for input(s): TSH, T4TOTAL, FREET4, T3FREE, THYROIDAB in the last 72 hours. Anemia Panel: No results for input(s): VITAMINB12, FOLATE, FERRITIN, TIBC, IRON, RETICCTPCT in the last 72 hours. Urine analysis:    Component Value Date/Time   COLORURINE YELLOW 11/18/2016 1847   APPEARANCEUR CLEAR 11/16/2016 1847   LABSPEC 1.013 11/14/2016 1847   PHURINE 5.0 12/01/2016 1847   GLUCOSEU NEGATIVE 12/12/2016 1847   HGBUR NEGATIVE 11/19/2016 1847   BILIRUBINUR NEGATIVE 12/02/2016 1847   KETONESUR NEGATIVE 11/15/2016 1847   PROTEINUR NEGATIVE 12/09/2016  1847   NITRITE NEGATIVE 11/19/2016 1847   LEUKOCYTESUR NEGATIVE 11/27/2016 1847   Recent Results (from the past 240 hour(s))  Culture, blood (Routine x 2)     Status: None (Preliminary result)   Collection Time: 12/12/2016  6:37 PM  Result Value Ref Range Status   Specimen Description BLOOD RIGHT ANTECUBITAL  Final   Special Requests   Final    BOTTLES DRAWN AEROBIC AND ANAEROBIC Blood Culture adequate volume   Culture  Setup Time   Final    GRAM POSITIVE COCCI IN BOTH AEROBIC AND ANAEROBIC BOTTLES Organism ID to follow Performed at [facility], [place] [address], [place], [place] [address]    Culture GRAM POSITIVE COCCI  Final   Report Status PENDING  Incomplete  Culture, blood (Routine x 2)     Status: None (Preliminary result)   Collection Time: 12/01/2016  6:40 PM  Result Value Ref Range Status   Specimen Description BLOOD RIGHT FOREARM  Final   Special Requests   Final    BOTTLES DRAWN AEROBIC AND ANAEROBIC Blood Culture adequate volume   Culture  Setup Time   Final    GRAM POSITIVE COCCI AEROBIC BOTTLE ONLY Performed at [facility], [place] [address], [place], [place] [address]    Culture GRAM POSITIVE COCCI  Final   Report Status PENDING  Incomplete    Culture, sputum-assessment     Status: None   Collection Time: 11/26/2016 11:40 PM  Result Value Ref Range Status   Specimen Description SPUTUM  Final   Special Requests Immunocompromised  Final   Sputum evaluation THIS SPECIMEN IS ACCEPTABLE FOR SPUTUM CULTURE  Final   Report Status 12/05/2016 FINAL  Final  Culture, respiratory (NON-Expectorated)     Status: None (Preliminary result)   Collection Time: 12/09/2016 11:40 PM  Result Value Ref Range Status   Specimen Description SPUTUM  Final   Special Requests Immunocompromised Reflexed from [id number]  Final   Gram Stain   Final    RARE WBC PRESENT,BOTH PMN AND MONONUCLEAR ABUNDANT SQUAMOUS EPITHELIAL CELLS PRESENT MODERATE GRAM POSITIVE COCCI MODERATE GRAM POSITIVE RODS MODERATE GRAM NEGATIVE RODS Performed at [facility], [place] [address], [place], [place] [address]    Culture PENDING  Incomplete   Report Status PENDING  Incomplete      Radiology Studies: Dg Chest 2 View  Result Date: 12/03/2016 CLINICAL DATA:  Generalized pain EXAM: CHEST  2 VIEW COMPARISON:  06/23/2005 FINDINGS: Asymmetric elevation right hemidiaphragm with right base collapse/consolidation and small right pleural effusion. The cardiopericardial silhouette is within normal limits for size. Right Port-A-Cath tip projects distal SVC level. Gastrostomy tube overlies the left upper quadrant. Degenerative changes are noted in the right shoulder. IMPRESSION: Right base collapse/ consolidation with small right pleural effusion. Aspiration and/ or infection could cause this appearance. Given the history of multiple pulmonary nodule seen on recent CT scan, malignant effusion also consideration. Electronically Signed   By: Misty Stanley M.D.   On: 12/12/2016 20:14    Scheduled Meds: . chlorhexidine  15 mL Mouth Rinse BID  . enoxaparin (LOVENOX) injection  40 mg Subcutaneous Q24H  . free water  100 mL Per Tube Q4H  . gabapentin  300 mg Oral TID  . mouth rinse  15 mL Mouth  Rinse q12n4p  . senna-docusate  1 tablet Oral BID   Continuous Infusions: . sodium chloride 100 mL/hr at 11/23/2016 2345  . ceFEPime (MAXIPIME) IV Stopped (12/05/16 0235)  . vancomycin  LOS: 1 day   Time spent: 25 minutes.  Vance Gather, MD [facility] Pager [phone]  If 7PM-7AM, please contact night-coverage [email or web address] Password [email or web address] 12/05/2016, 10:10 AM

## 2016-12-05 NOTE — Evaluation (Signed)
Physical Therapy Evaluation Patient Details Name: Antonio [name] MRN: [id number] DOB: September 15, 1955 Today's Date: 12/05/2016   History of Present Illness   61 y.o. male with medical history significant of supraglottic cancer(s/p resection, chemotherapy, radiation,and PEG followed by [name]), COPD, sickle cell trait; who presents with complaints of generalized body aches, fall. Dx of sepsis due to PNA. Recent admission 10/1-10/7/18 with hypercalcemia.   Clinical Impression  Pt admitted with above diagnosis. Pt currently with functional limitations due to the deficits listed below (see PT Problem List). Min A to transfer bed to recliner. Ambulation deferred 2* HR 129 with activity and 8/10 B knee pain. Pt will benefit from skilled PT to increase their independence and safety with mobility to allow discharge to the venue listed below.       Follow Up Recommendations Home health PT    Equipment Recommendations  Rolling walker with 5" wheels    Recommendations for Other Services       Precautions / Restrictions Precautions Precautions: Fall Precaution Comments: pt reports "slipping" recently; otherwise denies h/o falls in past 1 year Restrictions Weight Bearing Restrictions: No      Mobility  Bed Mobility Overal bed mobility: Needs Assistance Bed Mobility: Supine to Sit     Supine to sit: HOB elevated;Min assist     General bed mobility comments: min A to pivot hips to EOB  Transfers Overall transfer level: Needs assistance Equipment used: Rolling walker (2 wheeled) Transfers: Sit to/from [facility] Sit to Stand: Min assist         General transfer comment: min A to rise; SPT to recliner with RW  Ambulation/Gait             General Gait Details: deferred 2* pain (pain meds requested) and HR 129 with minimal activity  Stairs            Wheelchair Mobility    Modified Rankin (Stroke Patients Only)       Balance Overall balance  assessment: Needs assistance   Sitting balance-Leahy Scale: Good     Standing balance support: Bilateral upper extremity supported Standing balance-Leahy Scale: Fair Standing balance comment: requires BUE support                             Pertinent Vitals/Pain Pain Assessment: 0-10 Pain Score: 8  Pain Location: B knees ("sickle cell pain") Pain Descriptors / Indicators: Aching Pain Intervention(s): Limited activity within patient's tolerance;Monitored during session;Patient requesting pain meds-RN notified    Home Living Family/patient expects to be discharged to:: Private residence Living Arrangements: Alone Available Help at Discharge: Family;Available PRN/intermittently   Home Access: Level entry     Home Layout: One level Home Equipment: Cane - single point      Prior Function Level of Independence: Independent with assistive device(s)         Comments: uses cane PRN     Hand Dominance        Extremity/Trunk Assessment   Upper Extremity Assessment Upper Extremity Assessment: Overall WFL for tasks assessed    Lower Extremity Assessment Lower Extremity Assessment: Overall WFL for tasks assessed (B knee ext 4/5)    Cervical / Trunk Assessment Cervical / Trunk Assessment: Normal  Communication   Communication: Expressive difficulties  Cognition Arousal/Alertness: Awake/alert Behavior During Therapy: WFL for tasks assessed/performed Overall Cognitive Status: Within Functional Limits for tasks assessed  General Comments      Exercises     Assessment/Plan    PT Assessment Patient needs continued PT services  PT Problem List Decreased mobility;Decreased strength       PT Treatment Interventions Gait training;Therapeutic exercise;Patient/family education;Therapeutic activities;Functional mobility training    PT Goals (Current goals can be found in the Care Plan section)  Acute  Rehab PT Goals Patient Stated Goal: none stated PT Goal Formulation: With patient Time For Goal Achievement: 12/19/16 Potential to Achieve Goals: Good    Frequency Min 3X/week   Barriers to discharge        Co-evaluation               AM-PAC PT "6 Clicks" Daily Activity  Outcome Measure Difficulty turning over in bed (including adjusting bedclothes, sheets and blankets)?: None Difficulty moving from lying on back to sitting on the side of the bed? : Unable Difficulty sitting down on and standing up from a chair with arms (e.g., wheelchair, bedside commode, etc,.)?: Unable Help needed moving to and from a bed to chair (including a wheelchair)?: A Little Help needed walking in hospital room?: Total Help needed climbing 3-5 steps with a railing? : Total 6 Click Score: 11    End of Session Equipment Utilized During Treatment: Gait belt Activity Tolerance: Patient limited by fatigue;Patient limited by pain Patient left: in chair;with chair alarm set;with call bell/phone within reach Nurse Communication: Mobility status PT Visit Diagnosis: Difficulty in walking, not elsewhere classified (R26.2);Muscle weakness (generalized) (M62.81)    Time: 3810-1751 PT Time Calculation (min) (ACUTE ONLY): 18 min   Charges:   PT Evaluation $PT Eval Low Complexity: 1 Low     PT G Codes:          Philomena Doheny 12/05/2016, 12:48 PM [phone]

## 2016-12-05 NOTE — ED Notes (Signed)
Please call Verdis Frederickson, RN at [phone] for report at [place]. Thanks

## 2016-12-06 ENCOUNTER — Other Ambulatory Visit (HOSPITAL_COMMUNITY): Payer: Self-pay

## 2016-12-06 ENCOUNTER — Inpatient Hospital Stay (HOSPITAL_COMMUNITY): Payer: Medicare Other

## 2016-12-06 ENCOUNTER — Encounter (HOSPITAL_COMMUNITY): Payer: Self-pay | Admitting: Interventional Radiology

## 2016-12-06 ENCOUNTER — Other Ambulatory Visit: Payer: Self-pay

## 2016-12-06 ENCOUNTER — Ambulatory Visit: Payer: Self-pay

## 2016-12-06 ENCOUNTER — Encounter: Payer: Self-pay | Admitting: Nutrition

## 2016-12-06 DIAGNOSIS — A4101 Sepsis due to Methicillin susceptible Staphylococcus aureus: Secondary | ICD-10-CM

## 2016-12-06 DIAGNOSIS — R7881 Bacteremia: Secondary | ICD-10-CM

## 2016-12-06 DIAGNOSIS — R6520 Severe sepsis without septic shock: Secondary | ICD-10-CM

## 2016-12-06 DIAGNOSIS — T80211A Bloodstream infection due to central venous catheter, initial encounter: Secondary | ICD-10-CM | POA: Diagnosis present

## 2016-12-06 HISTORY — PX: IR REMOVAL TUN ACCESS W/ PORT W/O FL MOD SED: IMG2290

## 2016-12-06 LAB — ECHOCARDIOGRAM COMPLETE
HEIGHTINCHES: 63 in
Weight: 2021.18 oz

## 2016-12-06 LAB — CBC WITH DIFFERENTIAL/PLATELET
BASOS ABS: 0 10*3/uL (ref 0.0–0.1)
Basophils Relative: 0 %
EOS PCT: 0 %
Eosinophils Absolute: 0 10*3/uL (ref 0.0–0.7)
HCT: 24.8 % — ABNORMAL LOW (ref 39.0–52.0)
Hemoglobin: 8.7 g/dL — ABNORMAL LOW (ref 13.0–17.0)
Lymphocytes Relative: 7 %
Lymphs Abs: 0.2 10*3/uL — ABNORMAL LOW (ref 0.7–4.0)
MCH: 27.4 pg (ref 26.0–34.0)
MCHC: 35.1 g/dL (ref 30.0–36.0)
MCV: 78 fL (ref 78.0–100.0)
MONO ABS: 0.3 10*3/uL (ref 0.1–1.0)
MONOS PCT: 10 %
NEUTROS PCT: 83 %
Neutro Abs: 2.1 10*3/uL (ref 1.7–7.7)
PLATELETS: 126 10*3/uL — AB (ref 150–400)
RBC: 3.18 MIL/uL — AB (ref 4.22–5.81)
RDW: 16.1 % — ABNORMAL HIGH (ref 11.5–15.5)
WBC: 2.6 10*3/uL — AB (ref 4.0–10.5)

## 2016-12-06 LAB — COMPREHENSIVE METABOLIC PANEL
ALBUMIN: 2 g/dL — AB (ref 3.5–5.0)
ALT: 52 U/L (ref 17–63)
AST: 53 U/L — AB (ref 15–41)
Alkaline Phosphatase: 90 U/L (ref 38–126)
Anion gap: 6 (ref 5–15)
BUN: 20 mg/dL (ref 6–20)
CALCIUM: 7.6 mg/dL — AB (ref 8.9–10.3)
CO2: 23 mmol/L (ref 22–32)
CREATININE: 0.69 mg/dL (ref 0.61–1.24)
Chloride: 110 mmol/L (ref 101–111)
GFR calc Af Amer: >60 mL/min (ref >60)
Glucose, Bld: 110 mg/dL — ABNORMAL HIGH (ref 65–99)
Potassium: 4 mmol/L (ref 3.5–5.1)
Sodium: 139 mmol/L (ref 135–145)
Total Bilirubin: 0.9 mg/dL (ref 0.3–1.2)
Total Protein: 5.2 g/dL — ABNORMAL LOW (ref 6.5–8.1)

## 2016-12-06 LAB — LEGIONELLA PNEUMOPHILA SEROGP 1 UR AG: L. pneumophila Serogp 1 Ur Ag: NEGATIVE

## 2016-12-06 LAB — BPAM RBC
Blood Product Expiration Date: 201811202359
ISSUE DATE / TIME: 201810232254
UNIT TYPE AND RH: 5100

## 2016-12-06 LAB — TYPE AND SCREEN
ABO/RH(D): O POS
Antibody Screen: NEGATIVE
Unit division: 0

## 2016-12-06 LAB — GLUCOSE, CAPILLARY
Glucose-Capillary: 83 mg/dL (ref 65–99)
Glucose-Capillary: 122 mg/dL — ABNORMAL HIGH (ref 65–99)

## 2016-12-06 LAB — PROCALCITONIN: Procalcitonin: 8.77 ng/mL

## 2016-12-06 LAB — HIV ANTIBODY (ROUTINE TESTING W REFLEX): HIV SCREEN 4TH GENERATION: NONREACTIVE

## 2016-12-06 MED ORDER — MIDAZOLAM HCL 2 MG/2ML IJ SOLN
INTRAMUSCULAR | Status: AC
  Filled 2016-12-06: qty 2

## 2016-12-06 MED ORDER — LIDOCAINE HCL 1 % IJ SOLN
INTRAMUSCULAR | Status: AC
  Filled 2016-12-06: qty 20

## 2016-12-06 MED ORDER — MIDAZOLAM HCL 2 MG/2ML IJ SOLN
INTRAMUSCULAR | Status: DC | PRN
  Administered 2016-12-06 (×2): 1 mg via INTRAVENOUS

## 2016-12-06 MED ORDER — LIP MEDEX EX OINT
TOPICAL_OINTMENT | CUTANEOUS | Status: AC
  Administered 2016-12-06: 16:00:00
  Filled 2016-12-06: qty 7

## 2016-12-06 MED ORDER — LIDOCAINE HCL 1 % IJ SOLN
INTRAMUSCULAR | Status: DC | PRN
  Administered 2016-12-06: 10 mL

## 2016-12-06 MED ORDER — FENTANYL CITRATE (PF) 100 MCG/2ML IJ SOLN
INTRAMUSCULAR | Status: AC
  Filled 2016-12-06: qty 2

## 2016-12-06 MED ORDER — FENTANYL CITRATE (PF) 100 MCG/2ML IJ SOLN
INTRAMUSCULAR | Status: DC | PRN
  Administered 2016-12-06 (×2): 50 ug via INTRAVENOUS

## 2016-12-06 NOTE — Care Management Note (Signed)
Case Management Note  Patient Details  Name: Antonio [name] MRN: [id number] Date of Birth: February 22, 1955  Subjective/Objective:                  Lung ca and copd  Action/Plan: Date:  December 06 2016 Chart reviewed for concurrent status and case management needs.  Will continue to follow patient progress.  Discharge Planning: following for needs  Expected discharge date: December 09, 2016  Velva Harman, BSN, [place], [place]   Expected Discharge Date:   (unknown)               Expected Discharge Plan:  [place]  In-House Referral:     Discharge planning Services  CM Consult  Post Acute Care Choice:  Resumption of Svcs/PTA Provider Choice offered to:     DME Arranged:    DME Agency:     HH Arranged:    [place] Agency:     Status of Service:  In process, will continue to follow  If discussed at Long Length of Stay Meetings, dates discussed:    Additional Comments:  Leeroy Cha, RN 12/06/2016, 9:28 AM

## 2016-12-06 NOTE — Progress Notes (Addendum)
Name: Antonio [name] MRN: [id number] DOB: 07/27/1955    ADMISSION DATE:  12/02/2016 CONSULTATION DATE:  12/05/2016  REFERRING MD :  Dr. Bonner Puna  CHIEF COMPLAINT:  Sepsis   HISTORY OF PRESENT ILLNESS: 61 y.o. 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. Status post carboplatin and paclitaxel on 10/5. Completed radiation to the right lung and larynx on 7/5. Known poor prognosis & treatment is strictly palliative in nature. Underlying COPD and dysphagia status post PEG. Presented 10/22 with weight loss, decreased appetite, and multiple falls. Overall generalized pain & throat pain. Picture of sepsis with right sided findings concerning for possible pneumonia. Hypotension with responsiveness to IV fluid bolus. Found to have MSSA bacteremia. ID consulted & patient's antibiotics narrowed to Ancef.   SUBJECTIVE:  Patient denies any dyspnea or significant cough. No chest pain or pressure.  REVIEW OF SYSTEMS:  No recent subjective fever or chills. No abdominal pain or nausea.  VITAL SIGNS: Temp:  [98.2 F (36.8 C)-102.3 F (39.1 C)] 98.2 F (36.8 C) (10/24 0801) Pulse Rate:  [101-145] 104 (10/23 2336) Resp:  [15-25] 18 (10/24 0800) BP: (80-130)/(39-76) 126/66 (10/24 0800) SpO2:  [90 %-100 %] 97 % (10/24 0800) Weight:  [126 lb 5.2 oz (57.3 kg)] 126 lb 5.2 oz (57.3 kg) (10/24 0230)  PHYSICAL EXAMINATION: General:  Awake. Alert. No acute distress. Watching TV.  Integument:  Warm. Dry. No rash appreciated. Extremities:  No cyanosis or clubbing.  HEENT:  Moist mucus membranes. No scleral injection or icterus. Cardiovascular:  Regular rate. No appreciable JVD.  Pulmonary:  Normal work of breathing on room air. Diminished breath sounds right lung base. Poor voice quality. Abdomen: Soft. Normal bowel sounds. PEG tube in place. Neurological:  Cranial nerves 2-12 grossly in tact. No  meningismus. Moving all 4 extremities equally.    Recent Labs Lab 12/05/16 1941 12/05/16 2022 12/06/16 0405  NA 138 137 139  K 2.9* 2.8* 4.0  CL 104 105 110  CO2 23 23 23   BUN 21* 21* 20  CREATININE 0.78 0.75 0.69  GLUCOSE 157* 168* 110*    Recent Labs Lab 11/13/2016 1837 12/05/16 0244 12/06/16 0405  HGB 8.2* 7.4* 8.7*  HCT 23.5* 21.6* 24.8*  WBC 3.7* 2.8* 2.6*  PLT 184 139* 126*   Dg Chest 2 View  Result Date: 12/03/2016 CLINICAL DATA:  Generalized pain EXAM: CHEST  2 VIEW COMPARISON:  06/23/2005 FINDINGS: Asymmetric elevation right hemidiaphragm with right base collapse/consolidation and small right pleural effusion. The cardiopericardial silhouette is within normal limits for size. Right Port-A-Cath tip projects distal SVC level. Gastrostomy tube overlies the left upper quadrant. Degenerative changes are noted in the right shoulder. IMPRESSION: Right base collapse/ consolidation with small right pleural effusion. Aspiration and/ or infection could cause this appearance. Given the history of multiple pulmonary nodule seen on recent CT scan, malignant effusion also consideration. Electronically Signed   By: Misty Stanley M.D.   On: 11/21/2016 20:14   Dg Chest Port 1 View  Result Date: 12/06/2016 CLINICAL DATA:  Followup sepsis. EXAM: PORTABLE CHEST 1 VIEW COMPARISON:  12/06/2016 FINDINGS: Power port is unchanged with its tip in the SVC. Widespread bilateral pulmonary infiltrates persist, more extensive on the right than the left, with layering right pleural effusion. Probable slight worsening of patchy airspace density. IMPRESSION: Slight worsening of bilateral airspace density/infiltrate more extensive on the right than the left. Associated right effusion. Electronically Signed  By: Nelson Chimes M.D.   On: 12/06/2016 07:04   STUDIES:  CERVICAL LYMPH NODE BIOPSY 05/18/16:  Squamous cell carcinoma  PET CT 06/08/16: IMPRESSION: 1. Supraglottic laryngeal primary with left greater  than right cervical and left supraclavicular nodal metastasis. 2. Hypermetabolic right lower lobe pulmonary nodule is favored to represent a synchronous primary bronchogenic neoplasm. Isolated pulmonary metastasis could look similar. No thoracic nodal hypermetabolism. 3. Soft tissue hypermetabolism about the right iliac bone and greater trochanter of the proximal right femur are favored to be degenerative or posttraumatic. Soft tissue metastasis felt highly unlikely. 4.  Coronary artery atherosclerosis. Aortic atherosclerosis. 5. A tiny right lower lobe pulmonary nodule is most likely a subpleural lymph node. CT CHEST W/ CONTRAST 10/1: IMPRESSION: 1. Interim development of multiple enlarged mediastinal and hilar lymph nodes consistent with metastatic disease. 2. Development of multiple bilateral pulmonary nodules, also suspicious for metastatic disease. Suspect that the previously noted right lower lobe lung mass has increased in size, now measuring about 26 mm compared with 19 mm previously. 3. New small right-sided pleural effusion. Patchy consolidations in the right lower lobe, uncertain if this represents pneumonia, aspiration, or metastatic nodules. 4. Subcentimeter hypodense foci in the liver, too small to further characterize. When the patient is clinically stable and able to follow directions and hold their breath (preferably as an outpatient) further evaluation with dedicated abdominal MRI may be considered. PORT CXR 10/24:  Personally reviewed by me. Persistent right basilar opacification with silhouetting of hemidiaphragm & fullness to right hilum. Streaky opacification with some suggestion of air bronchogram and consolidation within the left lower lung as well. Unable to appreciate pleural effusion meniscus. Right-sided port in place.  MICROBIOLOGY:  Blood Cultures x2 10/22 >>> 2/2 Bottles Positive GPC (MSSA on PCR) Sputum Culture 10/22 >>> Group A Strep Culture 10/23 >>> MRSA PCR 10/23:   Negative Blood Culture x1 10/24 >>>  ANTIBIOTICS: Zosyn 10/22 (x1 dose) Vancomycin 10/22 (x1 dose) Cefepime 10/23 (x2 doses) Ancef 10/23 >>>  SIGNIFICANT EVENTS  10/22 - Admitted 10/23 - Transferred to SDU w/ repeat episode hypotension  ASSESSMENT / PLAN:  61 y.o. male with stage IV metastatic squamous cell carcinoma. Previous imaging indicated evolving metastatic disease of the lungs. Patient admitted with sepsis secondary to MSSA bacteremia. Imaging on x-ray could represent either a right-sided pneumonia and possibly evolving left-sided pneumonia or simply progression of underlying pulmonary metastasis and development of malignant or paramalignant pleural effusion.  1. Sepsis: Secondary to MSSA bacteremia. Currently on Ancef. Antibiotics and management as per ID and primary team. 2. Hypotension: Resolved with IV fluids. Recommend continued monitoring and stepdown with vitals per unit protocol. No need for vasopressors at this time. 3. Lung opacity on imaging study: Suspect progression of underlying metastasis. However, cannot rule out infectious component with bacteremia. Awaiting CT of the chest without contrast.  I have spent a total of 36 minutes of time today caring for the patient, reviewing the patient's electronic medical record, and with more than 50% of that time spent coordinating care with the patient as well as reviewing the continuing plan of care with the patient and nurse at bedside.  Remainder of care as per primary service and other consultants.  Sonia Baller Ashok Cordia, M.D. [facility] Pager:  [phone] After 7pm or if no response, call [phone] 8:52 AM 12/06/16

## 2016-12-06 NOTE — Progress Notes (Signed)
Patient refused CT of chest. He was not able to lay flat and states he was not going through the tunnel.

## 2016-12-06 NOTE — Progress Notes (Addendum)
PROGRESS NOTE  Antonio [name]  ATF:[id number] DOB: Sep 06, 1955 DOA: 12/09/2016 PCP: Maryellen Pile, MD  Outpatient Specialists: Oncology, [name] Brief Narrative: Antonio [name] is a 61 y.o. male with a history of supraglottic laryngeal CA s/p resection, chemo/radiation currently on palliative chemotherapy who was brought from home by EMS due to weakness, a fall, and confusion which was reported by his sister. He reports he slipped and fell on urine and denies any confusion. He reported chills and a productive cough to admitting MD. He denied fevers but had a temperature of 103.68F, heart rates 134-141, respirations 18-32, blood pressures 117/57-122/66, O2 saturations 91-96% on room air. Labs revealed WBC 3.7, hemoglobin 8.2, MCV 76.8, RDW 16.4, platelets 184, BUN 23, creatinine 1, albumin 2.5, lactic acid trending upwards to 1.92. Chest x-ray showed right base collapse with small pleural effusion. Urinalysis was negative for any signs of infection. He was given 3L of normal saline, empiric antibiotics and admitted. Blood cultures became positive for MSSA, antibiotics narrowed to ancef per ID recommendations and IR consulted for port removal.   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  - ID, Dr. Johnnye Sima, consulted. - Appreciate CCM involvement. Vitals improved.  - IR to pull port, suspected source.  - Vanc/cefepime > Ancef 10/23.  - Continue  IVF, responded to bolus, continue SDU monitoring today. - Blood cultures repeated - Echocardiogram ordered - Trending PCT, improving.   RLL opacity on imaging:  - CT chest ordered to evaluate ?growth of metastasis vs. infiltrate.   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  - Discussed with [name]. Oncology to remain on board during admission.  - Continue home morphine for cancer-related pain, which is stable.  - Palliative care team consulted  Dysphagia: s/p PEG May 2018. - Continue feeds thru PEG tube. Ok to give liquid po for pt comfort.  Pancytopenia: Suspect due to marrow suppression.  - Anemia improved 7.4 > 8.7 with transfusion 10/23. Continue monitoring.   Hypokalemia: Resolved with replacement. - Monitor  Fall with generalized weakness: suspect due to ongoing malignancy and chemotherapy worsened acutely by infection. - Physical therapy to evaluate.   History of hypercalcemia of malignancy: Admitted earlier Oct 2018.  - Resolved, will monitor.   Peripheral neuropathy: - Continue gabapentin   DVT prophylaxis: SCDs Code Status: Full Family Communication: None at bedside this AM Disposition Plan: continue in SDU today  Consultants:   Oncology, [name].  ID, Dr. Johnnye Sima  CCM, Dr. Ashok Cordia   Palliative care team  Procedures:   Echocardiogram ordered  Antimicrobials:  Vancomycin 10/22 > 10/23  Zosyn 10/22  Cefepime 10/22 > 10/23   Ancef 10/23 >>   Subjective: Hypotension, tachycardia stabilized with fluids overnight, never needed pressors. No pain. No chest pain, dyspnea or other complaints.   Objective: Vitals:   12/06/16 1400 12/06/16 1500 12/06/16 1600 12/06/16 1800  BP: 140/74 130/86 (!) 155/83 (!) 168/81  Pulse:  Resp: (!) 25 (!) 29 (!) 25 (!) 27  Temp:   98.9 F (37.2 C)   TempSrc:   Oral   SpO2: 95% 97% 99%   Weight:      Height:        Intake/Output Summary  (Last 24 hours) at 12/06/16 1905 Last data filed at 12/06/16 1400  Gross per 24 hour  Intake           2858.5 ml  Output             1300 ml  Net           1558.5 ml   Filed Weights   12/07/2016 2023 12/05/16 0115 12/06/16 0230  Weight: 58.1 kg (128 lb) 54.7 kg (120 lb 9.5 oz) 57.3 kg (126 lb 5.2 oz)    Gen: Chronically ill-appearing male in no distress, high spirits. Hoarse voice.  Pulm: Non-labored tachypnea breathing room air. Decreased at right base. Unchanged.  CV: Regular tachycardia. No murmur, rub, or gallop. No JVD, no pedal edema. GI: Abdomen soft, non-tender, non-distended, with normoactive bowel sounds. No organomegaly or masses felt. Ext: Warm, no deformities Skin: Port still in place, site without erythema, discharge or tenderness.  Neuro: Alert and oriented. No focal neurological deficits. Psych: Judgement and insight appear fair. Mood & affect appropriate.   Data Reviewed: I have personally reviewed following labs and imaging studies  CBC:  Recent Labs Lab 12/03/2016 1837 12/05/16 0244 12/06/16 0405  WBC 3.7* 2.8* 2.6*  NEUTROABS 3.2  --  2.1  HGB 8.2* 7.4* 8.7*  HCT 23.5* 21.6* 24.8*  MCV 76.8* 77.7* 78.0  PLT 184 139* 094*   Basic Metabolic Panel:  Recent Labs Lab 11/22/2016 1837 12/05/16 0244 12/05/16 1941 12/05/16 2022 12/06/16 0405  NA 137 138 138 137 139  K 3.7 3.5 2.9* 2.8* 4.0  CL 98* 103 104 105 110  CO2 29 26 23 23 23   GLUCOSE 135* 130* 157* 168* 110*  BUN 23* 23* 21* 21* 20  CREATININE 1.00 0.91 0.78 0.75 0.69  CALCIUM 8.6* 7.7* 7.4* 7.1* 7.6*  MG  --   --  2.0 2.0  --   PHOS  --   --  2.4* 2.5  --    GFR: Estimated Creatinine Clearance: 79 mL/min (by C-G formula based on SCr of 0.69 mg/dL). Liver Function Tests:  Recent Labs Lab 12/02/2016 1837 12/06/16 0405  AST 38 53*  ALT 57 52  ALKPHOS 91 90  BILITOT 1.3* 0.9  PROT 6.3* 5.2*  ALBUMIN 2.5* 2.0*   No results for input(s): LIPASE, AMYLASE in the last 168 hours. No results  for input(s): AMMONIA in the last 168 hours. Coagulation Profile:  Recent Labs Lab 12/06/2016 1837  INR 1.29   Cardiac Enzymes:  Recent Labs Lab 11/26/2016 1924 12/05/16 0037 12/05/16 0244  TROPONINI 0.03* 0.03* <0.03   BNP (last 3 results) No results for input(s): PROBNP in the last 8760 hours. HbA1C: No results for input(s): HGBA1C in the last 72 hours. CBG:  Recent Labs Lab 12/05/16 1620 12/05/16 2120 12/06/16 0730 12/06/16 1653  GLUCAP 114* 154* 83 122*   Lipid Profile: No results for input(s): CHOL, HDL, LDLCALC, TRIG, CHOLHDL, LDLDIRECT in the last 72 hours. Thyroid Function Tests: No results for input(s): TSH, T4TOTAL, FREET4, T3FREE, THYROIDAB in the last 72 hours. Anemia Panel: No results for input(s): VITAMINB12, FOLATE, FERRITIN, TIBC, IRON, RETICCTPCT in the last 72 hours. Urine analysis:    Component Value Date/Time  COLORURINE YELLOW 11/30/2016 1847   APPEARANCEUR CLEAR 12/10/2016 1847   LABSPEC 1.013 11/14/2016 1847   PHURINE 5.0 11/28/2016 1847   GLUCOSEU NEGATIVE 12/06/2016 1847   HGBUR NEGATIVE 11/15/2016 1847   BILIRUBINUR NEGATIVE 12/08/2016 1847   KETONESUR NEGATIVE 12/03/2016 1847   PROTEINUR NEGATIVE 12/07/2016 1847   NITRITE NEGATIVE 11/17/2016 1847   LEUKOCYTESUR NEGATIVE 11/13/2016 1847   Recent Results (from the past 240 hour(s))  Culture, blood (Routine x 2)     Status: Abnormal (Preliminary result)   Collection Time: 12/11/2016  6:37 PM  Result Value Ref Range Status   Specimen Description BLOOD RIGHT ANTECUBITAL  Final   Special Requests   Final    BOTTLES DRAWN AEROBIC AND ANAEROBIC Blood Culture adequate volume   Culture  Setup Time   Final    GRAM POSITIVE COCCI IN BOTH AEROBIC AND ANAEROBIC BOTTLES CRITICAL RESULT CALLED TO, READ BACK BY AND VERIFIED WITH: M. Lear Ng Pharm.D. 10:55 12/05/16 ([email or web address])    Culture (A)  Final    STAPHYLOCOCCUS AUREUS SUSCEPTIBILITIES TO FOLLOW Performed at [facility], [place] [address], [place], [place] [address]    Report Status PENDING  Incomplete  Blood Culture ID Panel (Reflexed)     Status: Abnormal   Collection Time: 12/01/2016  6:37 PM  Result Value Ref Range Status   Enterococcus species NOT DETECTED NOT DETECTED Final   Listeria monocytogenes NOT DETECTED NOT DETECTED Final   Staphylococcus species DETECTED (A) NOT DETECTED Final    Comment: CRITICAL RESULT CALLED TO, READ BACK BY AND VERIFIED WITH: M. Chanetta Marshall.D. 10:55 12/05/16 ([email or web address])    Staphylococcus aureus DETECTED (A) NOT DETECTED Final    Comment: Methicillin (oxacillin) susceptible Staphylococcus aureus (MSSA). Preferred therapy is anti staphylococcal beta lactam antibiotic (Cefazolin or Nafcillin), unless clinically contraindicated. CRITICAL RESULT CALLED TO, READ BACK BY AND VERIFIED WITH: M. Lear Ng Pharm.D. 10:55 12/05/16 ([email or web address])    Methicillin resistance NOT DETECTED NOT DETECTED Final   Streptococcus species NOT DETECTED NOT DETECTED Final   Streptococcus agalactiae NOT DETECTED NOT DETECTED Final   Streptococcus pneumoniae NOT DETECTED NOT DETECTED Final   Streptococcus pyogenes NOT DETECTED NOT DETECTED Final   Acinetobacter baumannii NOT DETECTED NOT DETECTED Final   Enterobacteriaceae species NOT DETECTED NOT DETECTED Final   Enterobacter cloacae complex NOT DETECTED NOT DETECTED Final   Escherichia coli NOT DETECTED NOT DETECTED Final   Klebsiella oxytoca NOT DETECTED NOT DETECTED Final   Klebsiella pneumoniae NOT DETECTED NOT DETECTED Final   Proteus species NOT DETECTED NOT DETECTED Final   Serratia marcescens NOT DETECTED NOT DETECTED Final   Haemophilus influenzae NOT DETECTED NOT DETECTED Final   Neisseria meningitidis NOT DETECTED NOT DETECTED Final   Pseudomonas aeruginosa NOT DETECTED NOT DETECTED Final   Candida albicans NOT DETECTED NOT DETECTED Final   Candida glabrata NOT DETECTED NOT DETECTED Final   Candida krusei NOT DETECTED NOT DETECTED Final   Candida parapsilosis NOT  DETECTED NOT DETECTED Final   Candida tropicalis NOT DETECTED NOT DETECTED Final    Comment: Performed at [facility], [address], [place], [place] [address]  Culture, blood (Routine x 2)     Status: Abnormal (Preliminary result)   Collection Time: 12/03/2016  6:40 PM  Result Value Ref Range Status   Specimen Description BLOOD RIGHT FOREARM  Final   Special Requests   Final    BOTTLES DRAWN AEROBIC AND ANAEROBIC Blood Culture adequate volume   Culture  Setup Time   Final  GRAM POSITIVE COCCI IN BOTH AEROBIC AND ANAEROBIC BOTTLES CRITICAL VALUE NOTED.  VALUE IS CONSISTENT WITH PREVIOUSLY REPORTED AND CALLED VALUE. Performed at [facility], [place] [address] [place], [place] [address]    Culture STAPHYLOCOCCUS AUREUS (A)  Final   Report Status PENDING  Incomplete  Culture, sputum-assessment     Status: None   Collection Time: 12/13/2016 11:40 PM  Result Value Ref Range Status   Specimen Description SPUTUM  Final   Special Requests Immunocompromised  Final   Sputum evaluation THIS SPECIMEN IS ACCEPTABLE FOR SPUTUM CULTURE  Final   Report Status 12/05/2016 FINAL  Final  Culture, respiratory (NON-Expectorated)     Status: None (Preliminary result)   Collection Time: 11/22/2016 11:40 PM  Result Value Ref Range Status   Specimen Description SPUTUM  Final   Special Requests Immunocompromised Reflexed from [id number]  Final   Gram Stain   Final    RARE WBC PRESENT,BOTH PMN AND MONONUCLEAR ABUNDANT SQUAMOUS EPITHELIAL CELLS PRESENT MODERATE GRAM POSITIVE COCCI MODERATE GRAM POSITIVE RODS MODERATE GRAM NEGATIVE RODS    Culture   Final    CULTURE REINCUBATED FOR BETTER GROWTH Performed at [facility], [place] [address] [place], [place] [address]    Report Status PENDING  Incomplete  Rapid strep screen     Status: None   Collection Time: 12/05/16  4:04 PM  Result Value Ref Range Status   Streptococcus, Group A Screen (Direct) NEGATIVE NEGATIVE Final    Comment: (NOTE) A  Rapid Antigen test may result negative if the antigen level in the sample is below the detection level of this test. The FDA has not cleared this test as a stand-alone test therefore the rapid antigen negative result has reflexed to a Group A Strep culture.   Culture, group A strep     Status: None (Preliminary result)   Collection Time: 12/05/16  4:04 PM  Result Value Ref Range Status   Specimen Description THROAT  Final   Special Requests NONE Reflexed from [id number]  Final   Culture   Final    CULTURE REINCUBATED FOR BETTER GROWTH Performed at [facility], [address], [place], [place] [address]    Report Status PENDING  Incomplete  MRSA PCR Screening     Status: None   Collection Time: 12/05/16  8:20 PM  Result Value Ref Range Status   MRSA by PCR NEGATIVE NEGATIVE Final    Comment:        The GeneXpert MRSA Assay (FDA approved for NASAL specimens only), is one component of a comprehensive MRSA colonization surveillance program. It is not intended to diagnose MRSA infection nor to guide or monitor treatment for MRSA infections.       Radiology Studies: Dg Chest 2 View  Result Date: 11/23/2016 CLINICAL DATA:  Generalized pain EXAM: CHEST  2 VIEW COMPARISON:  06/23/2005 FINDINGS: Asymmetric elevation right hemidiaphragm with right base collapse/consolidation and small right pleural effusion. The cardiopericardial silhouette is within normal limits for size. Right Port-A-Cath tip projects distal SVC level. Gastrostomy tube overlies the left upper quadrant. Degenerative changes are noted in the right shoulder. IMPRESSION: Right base collapse/ consolidation with small right pleural effusion. Aspiration and/ or infection could cause this appearance. Given the history of multiple pulmonary nodule seen on recent CT scan, malignant effusion also consideration. Electronically Signed   By: Misty Stanley M.D.   On: 11/28/2016 20:14   Ir Removal [employer] W/ [place] W/o Fl Mod  Sed  Result Date: 12/06/2016 CLINICAL DATA:  History of laryngeal carcinoma and status post right jugular Port-A-Cath placement on 06/23/2016. No admitted with staph aureus bacteremia and requiring removal of the Port-A-Cath. EXAM: REMOVAL OF IMPLANTED TUNNELED PORT-A-CATH MEDICATIONS: Moderate (conscious) sedation was employed during this procedure. A total of Versed 2.0 mg and Fentanyl 100 mcg was administered intravenously. Moderate Sedation Time: 25 minutes. The patient's level of consciousness and vital signs were monitored continuously by radiology nursing throughout the procedure under my direct supervision. PROCEDURE: A time-out was performed prior to initiating the procedure. The right chest Port-A-Cath site was prepped with chlorhexidine. A sterile gown and gloves were worn during the procedure. Local anesthesia was provided with 1% lidocaine. An incision was made overlying the Port-A-Cath with a #15 scalpel. Utilizing sharp and blunt dissection, the Port-A-Cath was removed. Fluid was aspirated from the port pocket via a dilator and sent for culture analysis. Portable cautery was utilized. Retention sutures were removed. The pocket was irrigated with sterile saline and debrided with wet gauze. The pocket was then packed with iodoform gauze. FINDINGS: With initial incision, there was return of purulent and bloody fluid from the port pocket. Approximately 5 mL was able to be aspirated and sent for culture analysis. The port was removed in its entirety. After vigorous debridement and irrigation of the pocket, the pocket was packed with iodoform gauze. This will be left in for 24 hours and removed. Wet-to-dry dressings will then be performed to let the pocket heal by secondary intention. IMPRESSION: Removal of implanted Port-A-Cath utilizing sharp and blunt dissection. There is evidence of port pocket infection with bloody and purulent fluid aspirated from the pocket. A sample was sent for culture analysis.  The pocket was packed with iodoform gauze after port removal and will be allowed to heal secondarily. Electronically Signed   By: Aletta Edouard M.D.   On: 12/06/2016 13:48   Dg Chest Port 1 View  Result Date: 12/06/2016 CLINICAL DATA:  Followup sepsis. EXAM: PORTABLE CHEST 1 VIEW COMPARISON:  12/03/2016 FINDINGS: Power port is unchanged with its tip in the SVC. Widespread bilateral pulmonary infiltrates persist, more extensive on the right than the left, with layering right pleural effusion. Probable slight worsening of patchy airspace density. IMPRESSION: Slight worsening of bilateral airspace density/infiltrate more extensive on the right than the left. Associated right effusion. Electronically Signed   By: Nelson Chimes M.D.   On: 12/06/2016 07:04    Scheduled Meds: . chlorhexidine  15 mL Mouth Rinse BID  . enoxaparin (LOVENOX) injection  40 mg Subcutaneous Q24H  . feeding supplement (OSMOLITE 1.5 CAL)  237 mL Per Tube 6 X Daily  . fentaNYL      . free water  100 mL Per Tube Q4H  . gabapentin  300 mg Per Tube TID  . lidocaine      . mouth rinse  15 mL Mouth Rinse q12n4p  . midazolam      . senna-docusate  1 tablet Oral BID   Continuous Infusions: . sodium chloride 100 mL/hr at 12/06/16 1400  . sodium chloride    .  ceFAZolin (ANCEF) IV Stopped (12/06/16 1810)     LOS: 2 days   Time spent: 25 minutes.  Vance Gather, MD [facility] Pager [phone]  If 7PM-7AM, please contact night-coverage [email or web address] Password [email or web address] 12/06/2016, 7:05 PM

## 2016-12-06 NOTE — Procedures (Signed)
Interventional Radiology Procedure Note  Procedure: Port removal  Complications: None  Estimated Blood Loss: < 10 mL  Grossly purulent, bloody fluid in port pocket.  Port removed.  5 mL of aspirated fluid sent for culture.  Pocket packed with iodoform gauze strip.  Will convert to wet to dry dressings in 24 hours and let pocket heal by secondary intention.  Venetia Night. Kathlene Cote, M.D Pager:  [phone]

## 2016-12-06 NOTE — Progress Notes (Signed)
INFECTIOUS DISEASE PROGRESS NOTE  ID: Antonio [name] is a 61 y.o. 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   Abtx:  Anti-infectives    Start     Dose/Rate Route Frequency Ordered Stop   12/06/16 1000  vancomycin (VANCOCIN) IVPB 750 mg/150 ml premix  Status:  Discontinued     750 mg 150 mL/hr over 60 Minutes Intravenous Every 12 hours 12/05/16 0008 12/05/16 0009   12/05/16 1800  ceFAZolin (ANCEF) IVPB 2g/100 mL premix     2 g 200 mL/hr over 30 Minutes Intravenous Every 8 hours 12/05/16 1122     12/05/16 1000  vancomycin (VANCOCIN) IVPB 750 mg/150 ml premix  Status:  Discontinued     750 mg 150 mL/hr over 60 Minutes Intravenous Every 12 hours 12/05/16 0009 12/05/16 1118   12/05/16 0200  ceFEPIme (MAXIPIME) 1 g in dextrose 5 % 50 mL IVPB  Status:  Discontinued     1 g 100 mL/hr over 30 Minutes Intravenous Every 8 hours 12/05/16 0009 12/05/16 1118   12/01/2016 1930  vancomycin (VANCOCIN) IVPB 1000 mg/200 mL premix     1,000 mg 200 mL/hr over 60 Minutes Intravenous  Once 11/16/2016 1915 11/21/2016 2238   12/10/2016 1930  piperacillin-tazobactam (ZOSYN) IVPB 3.375 g     3.375 g 100 mL/hr over 30 Minutes Intravenous  Once 11/28/2016 1915 12/01/2016 2001      Medications:  Scheduled: . chlorhexidine  15 mL Mouth Rinse BID  . enoxaparin (LOVENOX) injection  40 mg Subcutaneous Q24H  . feeding supplement (OSMOLITE 1.5 CAL)  237 mL Per Tube 6 X Daily  . free water  100 mL Per Tube Q4H  . gabapentin  300 mg  Per Tube TID  . mouth rinse  15 mL Mouth Rinse q12n4p  . senna-docusate  1 tablet Oral BID    Objective: Vital signs in last 24 hours: Temp:  [98.2 F (36.8 C)-102.3 F (39.1 C)] 98.2 F (36.8 C) (10/24 1315) Pulse Rate:  [104-145] 116 (10/24 1230) Resp:  [15-25] 25 (10/24 1400) BP: (80-161)/(39-77) 140/74 (10/24 1400) SpO2:  [85 %-100 %] 95 % (10/24 1400) Weight:  [57.3 kg (126 lb 5.2 oz)] 57.3 kg (126 lb 5.2 oz) (10/24 0230)   General appearance: alert and no distress Resp: diminished breath sounds anterior - bilateral Cardio: regular rate and rhythm GI: normal findings: bowel sounds normal and soft, non-tender  Lab Results  Recent Labs  12/05/16 0244  12/05/16 2022 12/06/16 0405  WBC 2.8*  --   --  2.6*  HGB 7.4*  --   --  8.7*  HCT 21.6*  --   --  24.8*  NA 138  < > 137 139  K 3.5  < > 2.8* 4.0  CL 103  < > 105 110  CO2 26  < > 23 23  BUN 23*  < > 21* 20  CREATININE 0.91  < > 0.75 0.69  < > = values in this interval not displayed.  Liver Panel  Recent Labs  11/16/2016 1837 12/06/16 0405  PROT 6.3* 5.2*  ALBUMIN 2.5* 2.0*  AST 38 53*  ALT 57 52  ALKPHOS 91 90  BILITOT 1.3* 0.9   Sedimentation Rate No results for input(s): ESRSEDRATE in the last 72 hours. C-Reactive Protein No results for input(s): CRP in the last 72 hours.  Microbiology: Recent Results (from the past 240 hour(s))  Culture, blood (Routine x 2)     Status: Abnormal (Preliminary result)   Collection Time: 11/16/2016  6:37 PM  Result Value Ref Range Status   Specimen Description BLOOD RIGHT ANTECUBITAL  Final   Special Requests   Final    BOTTLES DRAWN AEROBIC AND ANAEROBIC Blood Culture adequate volume   Culture  Setup Time   Final    GRAM POSITIVE COCCI IN BOTH AEROBIC AND ANAEROBIC BOTTLES CRITICAL RESULT CALLED TO, READ BACK BY AND VERIFIED WITH: M. Chanetta Marshall.D. 10:55 12/05/16 ([email or web address])    Culture (A)  Final    STAPHYLOCOCCUS AUREUS SUSCEPTIBILITIES TO FOLLOW Performed at [facility], [place] [address] [place], [place] [address]    Report Status PENDING  Incomplete  Blood Culture ID Panel (Reflexed)     Status: Abnormal   Collection Time: 11/19/2016  6:37 PM  Result Value Ref Range Status   Enterococcus species NOT DETECTED NOT DETECTED Final   Listeria monocytogenes NOT DETECTED NOT DETECTED Final   Staphylococcus species DETECTED (A) NOT DETECTED Final    Comment: CRITICAL RESULT CALLED TO, READ BACK BY AND VERIFIED WITH: M. Chanetta Marshall.D. 10:55 12/05/16 ([email or web address])    Staphylococcus aureus DETECTED (A) NOT DETECTED Final    Comment: Methicillin (oxacillin) susceptible Staphylococcus aureus (MSSA). Preferred therapy is anti staphylococcal beta lactam antibiotic (Cefazolin or Nafcillin), unless clinically contraindicated. CRITICAL RESULT CALLED TO, READ BACK BY AND VERIFIED WITH: M. Lear Ng Pharm.D. 10:55 12/05/16 ([email or web address])    Methicillin resistance NOT DETECTED NOT DETECTED Final   Streptococcus species NOT DETECTED NOT DETECTED Final   Streptococcus agalactiae NOT DETECTED NOT DETECTED Final   Streptococcus pneumoniae NOT DETECTED NOT DETECTED Final   Streptococcus pyogenes NOT DETECTED NOT DETECTED Final   Acinetobacter baumannii NOT DETECTED NOT DETECTED Final   Enterobacteriaceae species NOT DETECTED NOT DETECTED Final   Enterobacter cloacae complex NOT DETECTED NOT DETECTED Final   Escherichia coli NOT DETECTED NOT DETECTED Final   Klebsiella oxytoca NOT DETECTED NOT DETECTED Final   Klebsiella pneumoniae NOT DETECTED NOT DETECTED Final   Proteus species NOT DETECTED NOT DETECTED Final   Serratia marcescens NOT DETECTED NOT DETECTED Final   Haemophilus influenzae NOT DETECTED NOT DETECTED Final   Neisseria meningitidis NOT DETECTED NOT DETECTED Final   Pseudomonas aeruginosa NOT DETECTED NOT DETECTED Final   Candida albicans NOT DETECTED NOT DETECTED Final   Candida glabrata NOT DETECTED NOT DETECTED Final   Candida krusei NOT DETECTED NOT DETECTED  Final   Candida parapsilosis NOT DETECTED NOT DETECTED Final   Candida tropicalis NOT DETECTED NOT DETECTED Final    Comment: Performed at [facility], [address], [place], [place] [address]  Culture, blood (Routine x 2)     Status: Abnormal (Preliminary result)   Collection Time: 11/22/2016  6:40 PM  Result Value Ref Range Status   Specimen Description BLOOD RIGHT FOREARM  Final   Special Requests   Final    BOTTLES DRAWN AEROBIC AND ANAEROBIC Blood Culture adequate volume   Culture  Setup Time   Final    GRAM  POSITIVE COCCI IN BOTH AEROBIC AND ANAEROBIC BOTTLES CRITICAL VALUE NOTED.  VALUE IS CONSISTENT WITH PREVIOUSLY REPORTED AND CALLED VALUE. Performed at [facility], [place] [address] [place], [place] [address]    Culture STAPHYLOCOCCUS AUREUS (A)  Final   Report Status PENDING  Incomplete  Culture, sputum-assessment     Status: None   Collection Time: 11/27/2016 11:40 PM  Result Value Ref Range Status   Specimen Description SPUTUM  Final   Special Requests Immunocompromised  Final   Sputum evaluation THIS SPECIMEN IS ACCEPTABLE FOR SPUTUM CULTURE  Final   Report Status 12/05/2016 FINAL  Final  Culture, respiratory (NON-Expectorated)     Status: None (Preliminary result)   Collection Time: 12/10/2016 11:40 PM  Result Value Ref Range Status   Specimen Description SPUTUM  Final   Special Requests Immunocompromised Reflexed from [id number]  Final   Gram Stain   Final    RARE WBC PRESENT,BOTH PMN AND MONONUCLEAR ABUNDANT SQUAMOUS EPITHELIAL CELLS PRESENT MODERATE GRAM POSITIVE COCCI MODERATE GRAM POSITIVE RODS MODERATE GRAM NEGATIVE RODS    Culture   Final    CULTURE REINCUBATED FOR BETTER GROWTH Performed at [facility], [place] [address], [place], [place] [address]    Report Status PENDING  Incomplete  Rapid strep screen     Status: None   Collection Time: 12/05/16  4:04 PM  Result Value Ref Range Status   Streptococcus, Group A Screen (Direct) NEGATIVE  NEGATIVE Final    Comment: (NOTE) A Rapid Antigen test may result negative if the antigen level in the sample is below the detection level of this test. The FDA has not cleared this test as a stand-alone test therefore the rapid antigen negative result has reflexed to a Group A Strep culture.   Culture, group A strep     Status: None (Preliminary result)   Collection Time: 12/05/16  4:04 PM  Result Value Ref Range Status   Specimen Description THROAT  Final   Special Requests NONE Reflexed from [id number]  Final   Culture   Final    CULTURE REINCUBATED FOR BETTER GROWTH Performed at [facility], [address] [place], [place] [address]    Report Status PENDING  Incomplete  MRSA PCR Screening     Status: None   Collection Time: 12/05/16  8:20 PM  Result Value Ref Range Status   MRSA by PCR NEGATIVE NEGATIVE Final    Comment:        The GeneXpert MRSA Assay (FDA approved for NASAL specimens only), is one component of a comprehensive MRSA colonization surveillance program. It is not intended to diagnose MRSA infection nor to guide or monitor treatment for MRSA infections.     Studies/Results: Dg Chest 2 View  Result Date: 11/18/2016 CLINICAL DATA:  Generalized pain EXAM: CHEST  2 VIEW COMPARISON:  06/23/2005 FINDINGS: Asymmetric elevation right hemidiaphragm with right base collapse/consolidation and small right pleural effusion. The cardiopericardial silhouette is within normal limits for size. Right Port-A-Cath tip projects distal SVC level. Gastrostomy tube overlies the left upper quadrant. Degenerative changes are noted in the right shoulder. IMPRESSION: Right base collapse/ consolidation with small right pleural effusion. Aspiration and/ or infection could cause this appearance. Given the history of multiple pulmonary nodule seen on recent CT scan, malignant effusion also consideration. Electronically Signed   By: Misty Stanley M.D.   On: 12/03/2016 20:14   Ir Removal  [employer] W/ [place] W/o Fl Mod Sed  Result Date:  12/06/2016 CLINICAL DATA:  History of laryngeal carcinoma and status post right jugular Port-A-Cath placement on 06/23/2016. No admitted with staph aureus bacteremia and requiring removal of the Port-A-Cath. EXAM: REMOVAL OF IMPLANTED TUNNELED PORT-A-CATH MEDICATIONS: Moderate (conscious) sedation was employed during this procedure. A total of Versed 2.0 mg and Fentanyl 100 mcg was administered intravenously. Moderate Sedation Time: 25 minutes. The patient's level of consciousness and vital signs were monitored continuously by radiology nursing throughout the procedure under my direct supervision. PROCEDURE: A time-out was performed prior to initiating the procedure. The right chest Port-A-Cath site was prepped with chlorhexidine. A sterile gown and gloves were worn during the procedure. Local anesthesia was provided with 1% lidocaine. An incision was made overlying the Port-A-Cath with a #15 scalpel. Utilizing sharp and blunt dissection, the Port-A-Cath was removed. Fluid was aspirated from the port pocket via a dilator and sent for culture analysis. Portable cautery was utilized. Retention sutures were removed. The pocket was irrigated with sterile saline and debrided with wet gauze. The pocket was then packed with iodoform gauze. FINDINGS: With initial incision, there was return of purulent and bloody fluid from the port pocket. Approximately 5 mL was able to be aspirated and sent for culture analysis. The port was removed in its entirety. After vigorous debridement and irrigation of the pocket, the pocket was packed with iodoform gauze. This will be left in for 24 hours and removed. Wet-to-dry dressings will then be performed to let the pocket heal by secondary intention. IMPRESSION: Removal of implanted Port-A-Cath utilizing sharp and blunt dissection. There is evidence of port pocket infection with bloody and purulent fluid aspirated from the pocket. A sample  was sent for culture analysis. The pocket was packed with iodoform gauze after port removal and will be allowed to heal secondarily. Electronically Signed   By: Aletta Edouard M.D.   On: 12/06/2016 13:48   Dg Chest Port 1 View  Result Date: 12/06/2016 CLINICAL DATA:  Followup sepsis. EXAM: PORTABLE CHEST 1 VIEW COMPARISON:  11/30/2016 FINDINGS: Power port is unchanged with its tip in the SVC. Widespread bilateral pulmonary infiltrates persist, more extensive on the right than the left, with layering right pleural effusion. Probable slight worsening of patchy airspace density. IMPRESSION: Slight worsening of bilateral airspace density/infiltrate more extensive on the right than the left. Associated right effusion. Electronically Signed   By: Nelson Chimes M.D.   On: 12/06/2016 07:04     Assessment/Plan: Infected port Methicillin sensitive Staph aureus bacteremia Supraglottic Squamous Cell cancer dx 05-2016             XRT             Cisplatinin 06-2016 to 07-2016             Carboplatinin-Taxol 11-17-16 -->             Has been eval by palliative COPD [facility]  Polysubstance abuse (crack, marijuana, ETOH)             HIV (-) 05-2016  Total days of antibiotics: 2 ancef  CXR worsened (due to fluid boluses?) Temp and BP improved.  Await repeat BCx (sent today) TTE result pending.  Appreciate IR removal of port         Bobby Rumpf MD, FACP Infectious Diseases (pager) [phone] [email or web address] 12/06/2016, 3:10 PM  LOS: 2 days

## 2016-12-06 NOTE — Progress Notes (Addendum)
  Echocardiogram 2D Echocardiogram has been performed.  Technically difficult subcostal views due to drain in place.   Antonio [name] 12/06/2016, 3:00 PM

## 2016-12-07 ENCOUNTER — Inpatient Hospital Stay (HOSPITAL_COMMUNITY): Payer: Medicare Other

## 2016-12-07 ENCOUNTER — Ambulatory Visit (HOSPITAL_COMMUNITY): Payer: Medicare Other

## 2016-12-07 DIAGNOSIS — C771 Secondary and unspecified malignant neoplasm of intrathoracic lymph nodes: Secondary | ICD-10-CM

## 2016-12-07 DIAGNOSIS — J90 Pleural effusion, not elsewhere classified: Secondary | ICD-10-CM

## 2016-12-07 DIAGNOSIS — D6959 Other secondary thrombocytopenia: Secondary | ICD-10-CM

## 2016-12-07 DIAGNOSIS — D61818 Other pancytopenia: Secondary | ICD-10-CM

## 2016-12-07 DIAGNOSIS — E46 Unspecified protein-calorie malnutrition: Secondary | ICD-10-CM

## 2016-12-07 DIAGNOSIS — C787 Secondary malignant neoplasm of liver and intrahepatic bile duct: Secondary | ICD-10-CM

## 2016-12-07 DIAGNOSIS — J910 Malignant pleural effusion: Secondary | ICD-10-CM

## 2016-12-07 DIAGNOSIS — D6481 Anemia due to antineoplastic chemotherapy: Secondary | ICD-10-CM

## 2016-12-07 LAB — BASIC METABOLIC PANEL
Anion gap: 7 (ref 5–15)
BUN: 15 mg/dL (ref 6–20)
CALCIUM: 8 mg/dL — AB (ref 8.9–10.3)
CO2: 26 mmol/L (ref 22–32)
CREATININE: 0.66 mg/dL (ref 0.61–1.24)
Chloride: 107 mmol/L (ref 101–111)
GFR calc Af Amer: >60 mL/min (ref >60)
GFR calc non Af Amer: >60 mL/min (ref >60)
GLUCOSE: 113 mg/dL — AB (ref 65–99)
Potassium: 2.9 mmol/L — ABNORMAL LOW (ref 3.5–5.1)
Sodium: 140 mmol/L (ref 135–145)

## 2016-12-07 LAB — PROTEIN, PLEURAL OR PERITONEAL FLUID: Total protein, fluid: <3 g/dL

## 2016-12-07 LAB — GLUCOSE, CAPILLARY
GLUCOSE-CAPILLARY: 151 mg/dL — AB (ref 65–99)
Glucose-Capillary: 112 mg/dL — ABNORMAL HIGH (ref 65–99)
Glucose-Capillary: 81 mg/dL (ref 65–99)
Glucose-Capillary: 146 mg/dL — ABNORMAL HIGH (ref 65–99)
Glucose-Capillary: 171 mg/dL — ABNORMAL HIGH (ref 65–99)

## 2016-12-07 LAB — HEPATITIS PANEL, ACUTE
HCV Ab: <0.1 s/co ratio (ref 0.0–0.9)
HEP A IGM: NEGATIVE
HEP B C IGM: NEGATIVE
Hepatitis B Surface Ag: NEGATIVE

## 2016-12-07 LAB — CULTURE, BLOOD (ROUTINE X 2)
Special Requests: ADEQUATE
Special Requests: ADEQUATE

## 2016-12-07 LAB — CBC
HEMATOCRIT: 27.5 % — AB (ref 39.0–52.0)
Hemoglobin: 9.6 g/dL — ABNORMAL LOW (ref 13.0–17.0)
MCH: 27.3 pg (ref 26.0–34.0)
MCHC: 34.9 g/dL (ref 30.0–36.0)
MCV: 78.1 fL (ref 78.0–100.0)
PLATELETS: 139 10*3/uL — AB (ref 150–400)
RBC: 3.52 MIL/uL — ABNORMAL LOW (ref 4.22–5.81)
RDW: 16.3 % — AB (ref 11.5–15.5)
WBC: 3.1 10*3/uL — ABNORMAL LOW (ref 4.0–10.5)

## 2016-12-07 LAB — LACTATE DEHYDROGENASE, PLEURAL OR PERITONEAL FLUID: LD FL: 388 U/L — AB (ref 3–23)

## 2016-12-07 LAB — BODY FLUID CELL COUNT WITH DIFFERENTIAL
LYMPHS FL: 1 %
MONOCYTE-MACROPHAGE-SEROUS FLUID: 2 % — AB (ref 50–90)
NEUTROPHIL FLUID: 97 % — AB (ref 0–25)
WBC FLUID: 1234 uL — AB (ref 0–1000)

## 2016-12-07 LAB — PROTEIN, TOTAL: Total Protein: 5.1 g/dL — ABNORMAL LOW (ref 6.5–8.1)

## 2016-12-07 LAB — PROCALCITONIN: Procalcitonin: 6.01 ng/mL

## 2016-12-07 LAB — LACTATE DEHYDROGENASE: LDH: 191 U/L (ref 98–192)

## 2016-12-07 MED ORDER — SODIUM CHLORIDE 0.45 % IV SOLN: INTRAVENOUS | Status: DC

## 2016-12-07 MED ORDER — POTASSIUM CHLORIDE IN NACL 20-0.45 MEQ/L-% IV SOLN
INTRAVENOUS | Status: AC
  Administered 2016-12-07 – 2016-12-08 (×2): via INTRAVENOUS
  Filled 2016-12-07 (×4): qty 1000

## 2016-12-07 MED ORDER — POTASSIUM CHLORIDE 10 MEQ/100ML IV SOLN
10.0000 meq | INTRAVENOUS | Status: AC
  Administered 2016-12-07 (×5): 10 meq via INTRAVENOUS
  Filled 2016-12-07 (×5): qty 100

## 2016-12-07 NOTE — Progress Notes (Signed)
IP PROGRESS NOTE  Subjective:   Antonio [name] is followed by [name] for treatment of metastatic head and neck cancer. He completed cycle 2 Taxol/carboplatin on 11/29/2016. He was admitted 11/29/2016 with staph aureus bacteremia. He underwent removal of a grossly infected Port-A-Cath yesterday. Antonio [name] has no complaint today.  Objective: Vital signs in last 24 hours: Blood pressure 135/60, pulse (!) 116, temperature 99.5 F (37.5 C), temperature source Oral, resp. rate (!) 26, height 5\' 3"  (1.6 m), weight 129 lb 6.6 oz (58.7 kg), SpO2 98 %.  Intake/Output from previous day: 10/24 0701 - 10/25 0700 In: 3554.3 [P.O.:60; I.V.:2428.3; NG/GT:546; IV Piggyback:400] Out: 2100 [Urine:2100]  Physical Exam:  HEENT: No thrush Lungs: Clear bilaterally, no respiratory distress Cardiac: Regular rate and rhythm Abdomen: No hepatomegaly, nontender, up her abdomen feeding tube with a gauze dressing Extremities: No leg edema    Lab Results:  Recent Labs  12/06/16 0405 12/07/16 0327  WBC 2.6* 3.1*  HGB 8.7* 9.6*  HCT 24.8* 27.5*  PLT 126* 139*    BMET  Recent Labs  12/06/16 0405 12/07/16 0327  NA 139 140  K 4.0 2.9*  CL 110 107  CO2 23 26  GLUCOSE 110* 113*  BUN 20 15  CREATININE 0.69 0.66  CALCIUM 7.6* 8.0*    No results found for: CEA1  Studies/Results: Ct Chest Wo Contrast  Result Date: 12/06/2016 CLINICAL DATA:  Acute onset of generalized weakness. Status post fall. Confusion. Current history of supraglottic laryngeal cancer, status post chemotherapy and radiation therapy. EXAM: CT CHEST WITHOUT CONTRAST TECHNIQUE: Multidetector CT imaging of the chest was performed following the standard protocol without IV contrast. COMPARISON:  CT of the chest performed 11/13/2016 FINDINGS: Cardiovascular: Diffuse coronary artery calcifications are seen. The heart remains normal in size. Mild calcification is noted along the aortic arch and proximal great vessels.  Mediastinum/Nodes: Enlarged mediastinal and bilateral hilar nodes are again seen, less well characterized without contrast but concerning for metastatic disease. As before, mediastinal nodes measure up to 2.3 cm in short axis. Trace pericardial fluid remains within normal limits. The thyroid gland is unremarkable. No axillary lymphadenopathy is seen. Lungs/Pleura: Small to moderate right and small left pleural effusions are noted. There is partial consolidation of the right lower lobe, concerning for pneumonia. Nodular opacities are noted within both lungs, concerning for metastatic disease. Mild underlying interstitial prominence is noted. No pneumothorax is identified. Upper Abdomen: Vague hypodensities within the hepatic dome measure up to 2.8 cm in size, concerning for metastatic disease. The spleen is unremarkable in appearance. The visualized portions of the gallbladder, pancreas, adrenal glands and kidneys are within normal limits. Nonspecific perinephric stranding is noted bilaterally. A G-tube is noted ending at the body of the stomach. Scattered calcification is seen along the proximal abdominal aorta and its branches. Musculoskeletal: No acute osseous abnormalities are identified. Packing material is noted at a soft tissue defect at the right chest wall, status post removal of the patient's right-sided chest port. The visualized musculature is unremarkable in appearance. IMPRESSION: 1. Small to moderate right and small left pleural effusions. Increased partial consolidation of the right lower lung lobe, concerning for pneumonia. 2. Nodular opacities within both lungs, concerning for metastatic disease. 3. Enlarged mediastinal and bilateral hilar nodes remain concerning for metastatic disease. 4. Vague hypodensities at the hepatic dome measuring 2.8 cm in size, concerning for metastatic disease. 5. Scattered aortic atherosclerosis. 6. Diffuse coronary artery calcifications. 7. Packing material at a soft  tissue defect at the  right chest wall, status post removal of right-sided chest port. Electronically Signed   By: Garald Balding M.D.   On: 12/06/2016 22:48   Ir Removal [employer] W/ [place] W/o Fl Mod Sed  Result Date: 12/06/2016 CLINICAL DATA:  History of laryngeal carcinoma and status post right jugular Port-A-Cath placement on 06/23/2016. No admitted with staph aureus bacteremia and requiring removal of the Port-A-Cath. EXAM: REMOVAL OF IMPLANTED TUNNELED PORT-A-CATH MEDICATIONS: Moderate (conscious) sedation was employed during this procedure. A total of Versed 2.0 mg and Fentanyl 100 mcg was administered intravenously. Moderate Sedation Time: 25 minutes. The patient's level of consciousness and vital signs were monitored continuously by radiology nursing throughout the procedure under my direct supervision. PROCEDURE: A time-out was performed prior to initiating the procedure. The right chest Port-A-Cath site was prepped with chlorhexidine. A sterile gown and gloves were worn during the procedure. Local anesthesia was provided with 1% lidocaine. An incision was made overlying the Port-A-Cath with a #15 scalpel. Utilizing sharp and blunt dissection, the Port-A-Cath was removed. Fluid was aspirated from the port pocket via a dilator and sent for culture analysis. Portable cautery was utilized. Retention sutures were removed. The pocket was irrigated with sterile saline and debrided with wet gauze. The pocket was then packed with iodoform gauze. FINDINGS: With initial incision, there was return of purulent and bloody fluid from the port pocket. Approximately 5 mL was able to be aspirated and sent for culture analysis. The port was removed in its entirety. After vigorous debridement and irrigation of the pocket, the pocket was packed with iodoform gauze. This will be left in for 24 hours and removed. Wet-to-dry dressings will then be performed to let the pocket heal by secondary intention. IMPRESSION: Removal  of implanted Port-A-Cath utilizing sharp and blunt dissection. There is evidence of port pocket infection with bloody and purulent fluid aspirated from the pocket. A sample was sent for culture analysis. The pocket was packed with iodoform gauze after port removal and will be allowed to heal secondarily. Electronically Signed   By: Aletta Edouard M.D.   On: 12/06/2016 13:48   Dg Chest Port 1 View  Result Date: 12/07/2016 CLINICAL DATA:  Status post right-sided thoracentesis EXAM: PORTABLE CHEST 1 VIEW COMPARISON:  Chest radiograph and chest CT December 06, 2016 FINDINGS: Right pleural effusion is smaller following thoracentesis. No pneumothorax. There is a small pleural effusion on each side currently. There is atelectatic change in the lung bases. Heart is upper normal in size with pulmonary vascularity within normal limits. There is aortic atherosclerosis. No adenopathy. No evident bone lesions. IMPRESSION: No pneumothorax. Small pleural effusions bilaterally with bibasilar atelectasis. Heart is upper normal in size. No adenopathy evident. There is aortic atherosclerosis. Aortic Atherosclerosis (ICD10-I70.0). Electronically Signed   By: Lowella Grip III M.D.   On: 12/07/2016 10:54   Dg Chest Port 1 View  Result Date: 12/06/2016 CLINICAL DATA:  Followup sepsis. EXAM: PORTABLE CHEST 1 VIEW COMPARISON:  11/24/2016 FINDINGS: Power port is unchanged with its tip in the SVC. Widespread bilateral pulmonary infiltrates persist, more extensive on the right than the left, with layering right pleural effusion. Probable slight worsening of patchy airspace density. IMPRESSION: Slight worsening of bilateral airspace density/infiltrate more extensive on the right than the left. Associated right effusion. Electronically Signed   By: Nelson Chimes M.D.   On: 12/06/2016 07:04    Medications: I have reviewed the patient's current medications.  Assessment/Plan:  1. Metastatic head and neck cancer, currently being  treated with Taxol/carboplatin chemotherapy, last given 11/29/2016 2. MSSA bacteremia secondary to an infected Port-A-Cath, status post Port-A-Cath removal 12/06/2016, maintained on cefazolin 3. Anemia secondary to chemotherapy, infection, and chronic disease 4. Thrombocytopenia secondary to chemotherapy and bacteremia 5. Malnutrition-maintained on tube feedings 6. History of hypercalcemia of malignancy-improved  Antonio [name] has metastatic head and neck cancer. Has completed 2 treatments with Taxol/carboplatin. The CT on hospital admission reveals evidence of metastatic disease involving chest lymph nodes, lung nodules, and the liver. He also has bilateral pleural effusions which may be malignant.  He is recovering from admission with Staphylococcus bacteremia. He is scheduled for outpatient follow-up with [name] 12/19/2016. She will decide on further chemotherapy based on his performance status and review of the 12/06/2016 chest CT.  Recommendations: 1. Continue antibiotics as recommended by infectious disease 2. Management of sepsis syndrome per critical care medicine 3. Follow-up right pleural fluid cytology  Please call Oncology as needed. [name] will return 12/11/2016.   LOS: 3 days   Donneta Romberg, MD   12/07/2016, 1:45 PM

## 2016-12-07 NOTE — Progress Notes (Signed)
PROGRESS NOTE  Antonio [name]  EVO:[id number] DOB: [birth year] DOA: 11/22/2016 PCP: Maryellen Pile, MD  Outpatient Specialists: Oncology, [name] Brief Narrative: Antonio [name] is a 61 y.o. male with a history of supraglottic laryngeal CA s/p resection, chemo/radiation currently on palliative chemotherapy who was brought from home by EMS due to weakness, a fall, and confusion which was reported by his sister. He reports he slipped and fell on urine and denies any confusion. He reported chills and a productive cough to admitting MD. He denied fevers but had a temperature of 103.11F, heart rates 134-141, respirations 18-32, blood pressures 117/57-122/66, O2 saturations 91-96% on room air. Labs revealed WBC 3.7, hemoglobin 8.2, MCV 76.8, RDW 16.4, platelets 184, BUN 23, creatinine 1, albumin 2.5, lactic acid trending upwards to 1.92. Chest x-ray showed right base collapse with small pleural effusion. Urinalysis was negative for any signs of infection. He was given 3L of normal saline, empiric antibiotics and admitted. Blood cultures became positive for MSSA, antibiotics narrowed to ancef per ID recommendations. Pt became hypotensive and was transferred to the stepdown unit with CCM consult, though vitals have stabilized with fluids alone. Port was removed with purulent pocket noted on 10/24 and thoracentesis was performed 10/25.   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 Port removed 10/24. Hepatitis, HIV negative.  - ID, Dr. Johnnye Sima, consulted. - Appreciate CCM involvement. Vitals improved.  - Vanc/cefepime > Ancef 10/23.  - Continue IVF - Blood cultures repeated 10/24 - Echocardiogram made no mention of vegetations. Will discussed TEE with cardiology given ID recommendations.   RLL opacity on imaging:  - CT chest ordered to evaluate but pt unable to undergo this. Thoracentesis performed 10/25 by CCM with diagnostic/therapeutic intent.  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  - Discussed with [name]. Oncology to remain on board during admission.  - Continue home morphine for cancer-related pain, which is stable.  - Palliative care team consulted  Dysphagia: s/p PEG May 2018. - Continue feeds thru PEG tube. Ok to give liquid po for pt comfort.  Pancytopenia: Suspect due to marrow suppression.  - Anemia improved 7.4 > 8.7 with transfusion 10/23. Continue monitoring.   Hypokalemia: Resolved with replacement. - Monitor  Fall with generalized weakness: suspect due to ongoing malignancy and chemotherapy worsened acutely by infection. - Physical therapy to evaluate.   History of hypercalcemia of malignancy: Admitted earlier Oct 2018.  - Resolved, will monitor.   Peripheral neuropathy: - Continue gabapentin   DVT prophylaxis: SCDs Code Status: Full Family Communication: None at bedside this AM Disposition Plan: continue in SDU today. If vitals stable 10/26, may transfer to telemetry.   Consultants:   Oncology, [name].  ID, Dr. Johnnye Sima  IR, Dr. Anselm Pancoast  CCM, Dr. Ashok Cordia   Palliative care team  Procedures:   Echocardiogram 12/06/2016: - Left ventricle: The cavity size was normal. Wall thickness  was   normal. Systolic function was normal. The estimated ejection   fraction was in the range of 55%  to 60%. Wall motion was normal;   there were no regional wall motion abnormalities. Doppler   parameters are consistent with abnormal left ventricular   relaxation (grade 1 diastolic dysfunction). - Mitral valve: Calcified annulus.  Impressions: - Normal LV systolic function; mild diastolic dysfunction.  Antimicrobials:  Vancomycin 10/22 > 10/23  Zosyn 10/22  Cefepime 10/22 > 10/23   Ancef 10/23 >>   Subjective: Annoyed that he keeps trying to take a nap and we keep waking him up. No chest pain, denies dyspnea.    Objective: Vitals:   12/07/16 1000 12/07/16 1100 12/07/16 1200 12/07/16 1300  BP: (!) 112/49 (!) 120/59 135/60 (!) 117/48  Pulse:      Resp: 20 (!) 23 (!) 26   Temp:   99.5 F (37.5 C)   TempSrc:   Oral   SpO2: 97% 96% 98% 100%  Weight:      Height:        Intake/Output Summary (Last 24 hours) at 12/07/16 1601 Last data filed at 12/07/16 1504  Gross per 24 hour  Intake             3261 ml  Output             2650 ml  Net              611 ml   Filed Weights   12/05/16 0115 12/06/16 0230 12/07/16 0337  Weight: 54.7 kg (120 lb 9.5 oz) 57.3 kg (126 lb 5.2 oz) 58.7 kg (129 lb 6.6 oz)    Gen: Chronically ill-appearing male in no distress. Pulm: Non-labored tachypnea breathing room air. Decreased at right base. Hoarse voice without stridor. CV: Regular tachycardia. No murmur, rub, or gallop. No JVD, no pedal edema. GI: Abdomen soft, non-tender, non-distended, with normoactive bowel sounds. No organomegaly or masses felt. Ext: Warm, no deformities Skin: Port site with serosanguinous discharge on dressing Neuro: Alert and oriented. No focal neurological deficits. Psych: Judgement and insight appear fair. Mood & affect appropriate.   Data Reviewed: I have personally reviewed following labs and imaging studies  CBC:  Recent Labs Lab 12/09/2016 1837 12/05/16 0244 12/06/16 0405 12/07/16 0327  WBC 3.7* 2.8* 2.6* 3.1*  NEUTROABS 3.2  --  2.1  --   HGB  8.2* 7.4* 8.7* 9.6*  HCT 23.5* 21.6* 24.8* 27.5*  MCV 76.8* 77.7* 78.0 78.1  PLT 184 139* 126* 299*   Basic Metabolic Panel:  Recent Labs Lab 12/05/16 0244 12/05/16 1941 12/05/16 2022 12/06/16 0405 12/07/16 0327  NA 138 138 137 139 140  K 3.5 2.9* 2.8* 4.0 2.9*  CL 103 104 105 110 107  CO2 26 23 23 23 26   GLUCOSE 130* 157* 168* 110* 113*  BUN 23* 21* 21* 20 15  CREATININE 0.91 0.78 0.75 0.69 0.66  CALCIUM 7.7* 7.4* 7.1* 7.6* 8.0*  MG  --  2.0 2.0  --   --   PHOS  --  2.4* 2.5  --   --    GFR: Estimated Creatinine Clearance: 79 mL/min (by C-G formula based on SCr of 0.66 mg/dL). Liver Function Tests:  Recent Labs Lab 11/17/2016 1837 12/06/16 0405 12/07/16 1104  AST 38 53*  --   ALT 57 52  --   ALKPHOS 91 90  --   BILITOT 1.3* 0.9  --   PROT 6.3* 5.2* 5.1*  ALBUMIN  2.5* 2.0*  --    No results for input(s): LIPASE, AMYLASE in the last 168 hours. No results for input(s): AMMONIA in the last 168 hours. Coagulation Profile:  Recent Labs Lab 12/06/2016 1837  INR 1.29   Cardiac Enzymes:  Recent Labs Lab 11/19/2016 1924 12/05/16 0037 12/05/16 0244  TROPONINI 0.03* 0.03* <0.03   BNP (last 3 results) No results for input(s): PROBNP in the last 8760 hours. HbA1C: No results for input(s): HGBA1C in the last 72 hours. CBG:  Recent Labs Lab 12/06/16 0730 12/06/16 1653 12/06/16 2110 12/07/16 0743 12/07/16 1153  GLUCAP 83 122* 112* 81 146*   Lipid Profile: No results for input(s): CHOL, HDL, LDLCALC, TRIG, CHOLHDL, LDLDIRECT in the last 72 hours. Thyroid Function Tests: No results for input(s): TSH, T4TOTAL, FREET4, T3FREE, THYROIDAB in the last 72 hours. Anemia Panel: No results for input(s): VITAMINB12, FOLATE, FERRITIN, TIBC, IRON, RETICCTPCT in the last 72 hours. Urine analysis:    Component Value Date/Time   COLORURINE YELLOW 11/21/2016 1847   APPEARANCEUR CLEAR 12/13/2016 1847   LABSPEC 1.013 11/22/2016 1847   PHURINE 5.0 12/07/2016 1847    GLUCOSEU NEGATIVE 12/10/2016 1847   HGBUR NEGATIVE 12/01/2016 1847   BILIRUBINUR NEGATIVE 11/20/2016 1847   KETONESUR NEGATIVE 12/12/2016 1847   PROTEINUR NEGATIVE 12/05/2016 1847   NITRITE NEGATIVE 12/10/2016 1847   LEUKOCYTESUR NEGATIVE 11/13/2016 1847   Recent Results (from the past 240 hour(s))  Culture, blood (Routine x 2)     Status: Abnormal   Collection Time: 12/08/2016  6:37 PM  Result Value Ref Range Status   Specimen Description BLOOD RIGHT ANTECUBITAL  Final   Special Requests   Final    BOTTLES DRAWN AEROBIC AND ANAEROBIC Blood Culture adequate volume   Culture  Setup Time   Final    GRAM POSITIVE COCCI IN BOTH AEROBIC AND ANAEROBIC BOTTLES CRITICAL RESULT CALLED TO, READ BACK BY AND VERIFIED WITH: M. Lear Ng Pharm.D. 10:55 12/05/16 ([email or web address]) Performed at [facility], [place] [address] [place], [place] [address]    Culture STAPHYLOCOCCUS AUREUS (A)  Final   Report Status 12/07/2016 FINAL  Final   Organism ID, Bacteria STAPHYLOCOCCUS AUREUS  Final      Susceptibility   Staphylococcus aureus - MIC*    CIPROFLOXACIN <=0.5 SENSITIVE Sensitive     ERYTHROMYCIN <=0.25 SENSITIVE Sensitive     GENTAMICIN <=0.5 SENSITIVE Sensitive     OXACILLIN 0.5 SENSITIVE Sensitive     TETRACYCLINE <=1 SENSITIVE Sensitive     VANCOMYCIN 1 SENSITIVE Sensitive     TRIMETH/SULFA <=10 SENSITIVE Sensitive     CLINDAMYCIN <=0.25 SENSITIVE Sensitive     RIFAMPIN <=0.5 SENSITIVE Sensitive     Inducible Clindamycin NEGATIVE Sensitive     * STAPHYLOCOCCUS AUREUS  Blood Culture ID Panel (Reflexed)     Status: Abnormal   Collection Time: 11/26/2016  6:37 PM  Result Value Ref Range Status   Enterococcus species NOT DETECTED NOT DETECTED Final   Listeria monocytogenes NOT DETECTED NOT DETECTED Final   Staphylococcus species DETECTED (A) NOT DETECTED Final    Comment: CRITICAL RESULT CALLED TO, READ BACK BY AND VERIFIED WITH: M. Chanetta Marshall.D. 10:55 12/05/16 ([email or web address])    Staphylococcus aureus  DETECTED (A) NOT DETECTED Final    Comment: Methicillin (oxacillin) susceptible Staphylococcus aureus (MSSA). Preferred therapy is anti staphylococcal beta lactam antibiotic (Cefazolin or Nafcillin), unless clinically contraindicated. CRITICAL RESULT CALLED TO, READ BACK BY AND VERIFIED WITH: M. Lear Ng Pharm.D. 10:55 12/05/16 ([email or web address])    Methicillin  resistance NOT DETECTED NOT DETECTED Final   Streptococcus species NOT DETECTED NOT DETECTED Final   Streptococcus agalactiae NOT DETECTED NOT DETECTED Final   Streptococcus pneumoniae NOT DETECTED NOT DETECTED Final   Streptococcus pyogenes NOT DETECTED NOT DETECTED Final   Acinetobacter baumannii NOT DETECTED NOT DETECTED Final   Enterobacteriaceae species NOT DETECTED NOT DETECTED Final   Enterobacter cloacae complex NOT DETECTED NOT DETECTED Final   Escherichia coli NOT DETECTED NOT DETECTED Final   Klebsiella oxytoca NOT DETECTED NOT DETECTED Final   Klebsiella pneumoniae NOT DETECTED NOT DETECTED Final   Proteus species NOT DETECTED NOT DETECTED Final   Serratia marcescens NOT DETECTED NOT DETECTED Final   Haemophilus influenzae NOT DETECTED NOT DETECTED Final   Neisseria meningitidis NOT DETECTED NOT DETECTED Final   Pseudomonas aeruginosa NOT DETECTED NOT DETECTED Final   Candida albicans NOT DETECTED NOT DETECTED Final   Candida glabrata NOT DETECTED NOT DETECTED Final   Candida krusei NOT DETECTED NOT DETECTED Final   Candida parapsilosis NOT DETECTED NOT DETECTED Final   Candida tropicalis NOT DETECTED NOT DETECTED Final    Comment: Performed at [facility], [place] [address], [place], [place] [address]  Culture, blood (Routine x 2)     Status: Abnormal   Collection Time: 11/27/2016  6:40 PM  Result Value Ref Range Status   Specimen Description BLOOD RIGHT FOREARM  Final   Special Requests   Final    BOTTLES DRAWN AEROBIC AND ANAEROBIC Blood Culture adequate volume   Culture  Setup Time   Final    GRAM POSITIVE COCCI IN BOTH  AEROBIC AND ANAEROBIC BOTTLES CRITICAL VALUE NOTED.  VALUE IS CONSISTENT WITH PREVIOUSLY REPORTED AND CALLED VALUE.    Culture (A)  Final    STAPHYLOCOCCUS AUREUS SUSCEPTIBILITIES PERFORMED ON PREVIOUS CULTURE WITHIN THE LAST 5 DAYS. Performed at [facility], [place] [address], [place], [place] [address]    Report Status 12/07/2016 FINAL  Final  Culture, sputum-assessment     Status: None   Collection Time: 11/20/2016 11:40 PM  Result Value Ref Range Status   Specimen Description SPUTUM  Final   Special Requests Immunocompromised  Final   Sputum evaluation THIS SPECIMEN IS ACCEPTABLE FOR SPUTUM CULTURE  Final   Report Status 12/05/2016 FINAL  Final  Culture, respiratory (NON-Expectorated)     Status: None (Preliminary result)   Collection Time: 12/10/2016 11:40 PM  Result Value Ref Range Status   Specimen Description SPUTUM  Final   Special Requests Immunocompromised Reflexed from [id number]  Final   Gram Stain   Final    RARE WBC PRESENT,BOTH PMN AND MONONUCLEAR ABUNDANT SQUAMOUS EPITHELIAL CELLS PRESENT MODERATE GRAM POSITIVE COCCI MODERATE GRAM POSITIVE RODS MODERATE GRAM NEGATIVE RODS    Culture   Final    CULTURE REINCUBATED FOR BETTER GROWTH Performed at [facility], [place] [address], [place], [place] [address]    Report Status PENDING  Incomplete  Rapid strep screen     Status: None   Collection Time: 12/05/16  4:04 PM  Result Value Ref Range Status   Streptococcus, Group A Screen (Direct) NEGATIVE NEGATIVE Final    Comment: (NOTE) A Rapid Antigen test may result negative if the antigen level in the sample is below the detection level of this test. The FDA has not cleared this test as a stand-alone test therefore the rapid antigen negative result has reflexed to a Group A Strep culture.   Culture, group A strep  Status: None (Preliminary result)   Collection Time: 12/05/16  4:04 PM  Result Value Ref Range Status   Specimen Description THROAT  Final   Special  Requests NONE Reflexed from [id number]  Final   Culture   Final    CULTURE REINCUBATED FOR BETTER GROWTH Performed at [facility], [place] [address], [place], [place] [address]    Report Status PENDING  Incomplete  MRSA PCR Screening     Status: None   Collection Time: 12/05/16  8:20 PM  Result Value Ref Range Status   MRSA by PCR NEGATIVE NEGATIVE Final    Comment:        The GeneXpert MRSA Assay (FDA approved for NASAL specimens only), is one component of a comprehensive MRSA colonization surveillance program. It is not intended to diagnose MRSA infection nor to guide or monitor treatment for MRSA infections.   Culture, blood (Routine X 2) w Reflex to ID Panel     Status: None (Preliminary result)   Collection Time: 12/06/16  4:05 AM  Result Value Ref Range Status   Specimen Description BLOOD LEFT HAND  Final   Special Requests   Final    BOTTLES DRAWN AEROBIC AND ANAEROBIC Blood Culture adequate volume   Culture   Final    NO GROWTH 1 DAY Performed at [facility], [place] [address], [place], [place] [address]    Report Status PENDING  Incomplete  Aerobic/Anaerobic Culture (surgical/deep wound)     Status: None (Preliminary result)   Collection Time: 12/06/16 12:41 PM  Result Value Ref Range Status   Specimen Description WOUND RIGHT CHEST  Final   Special Requests Normal  Final   Gram Stain   Final    ABUNDANT WBC PRESENT, PREDOMINANTLY PMN ABUNDANT GRAM POSITIVE COCCI IN PAIRS    Culture   Final    ABUNDANT STAPHYLOCOCCUS AUREUS SUSCEPTIBILITIES TO FOLLOW Performed at [facility], [place] [address], [place], [place] [address]    Report Status PENDING  Incomplete  Body fluid culture     Status: None (Preliminary result)   Collection Time: 12/07/16 10:31 AM  Result Value Ref Range Status   Specimen Description PLEURAL RIGHT  Final   Special Requests Immunocompromised  Final   Gram Stain   Final    ABUNDANT WBC PRESENT,BOTH PMN AND MONONUCLEAR NO  ORGANISMS SEEN Performed at [facility], [address], [place], [place] [address]    Culture PENDING  Incomplete   Report Status PENDING  Incomplete      Radiology Studies: Ct Chest Wo Contrast  Result Date: 12/06/2016 CLINICAL DATA:  Acute onset of generalized weakness. Status post fall. Confusion. Current history of supraglottic laryngeal cancer, status post chemotherapy and radiation therapy. EXAM: CT CHEST WITHOUT CONTRAST TECHNIQUE: Multidetector CT imaging of the chest was performed following the standard protocol without IV contrast. COMPARISON:  CT of the chest performed 11/13/2016 FINDINGS: Cardiovascular: Diffuse coronary artery calcifications are seen. The heart remains normal in size. Mild calcification is noted along the aortic arch and proximal great vessels. Mediastinum/Nodes: Enlarged mediastinal and bilateral hilar nodes are again seen, less well characterized without contrast but concerning for metastatic disease. As before, mediastinal nodes measure up to 2.3 cm in short axis. Trace pericardial fluid remains within normal limits. The thyroid gland is unremarkable. No axillary lymphadenopathy is seen. Lungs/Pleura: Small to moderate right and small left pleural effusions are noted. There is partial consolidation of the right lower lobe, concerning for  pneumonia. Nodular opacities are noted within both lungs, concerning for metastatic disease. Mild underlying interstitial prominence is noted. No pneumothorax is identified. Upper Abdomen: Vague hypodensities within the hepatic dome measure up to 2.8 cm in size, concerning for metastatic disease. The spleen is unremarkable in appearance. The visualized portions of the gallbladder, pancreas, adrenal glands and kidneys are within normal limits. Nonspecific perinephric stranding is noted bilaterally. A G-tube is noted ending at the body of the stomach. Scattered calcification is seen along the proximal abdominal aorta and its  branches. Musculoskeletal: No acute osseous abnormalities are identified. Packing material is noted at a soft tissue defect at the right chest wall, status post removal of the patient's right-sided chest port. The visualized musculature is unremarkable in appearance. IMPRESSION: 1. Small to moderate right and small left pleural effusions. Increased partial consolidation of the right lower lung lobe, concerning for pneumonia. 2. Nodular opacities within both lungs, concerning for metastatic disease. 3. Enlarged mediastinal and bilateral hilar nodes remain concerning for metastatic disease. 4. Vague hypodensities at the hepatic dome measuring 2.8 cm in size, concerning for metastatic disease. 5. Scattered aortic atherosclerosis. 6. Diffuse coronary artery calcifications. 7. Packing material at a soft tissue defect at the right chest wall, status post removal of right-sided chest port. Electronically Signed   By: Garald Balding M.D.   On: 12/06/2016 22:48   Ir Removal [employer] W/ [place] W/o Fl Mod Sed  Result Date: 12/06/2016 CLINICAL DATA:  History of laryngeal carcinoma and status post right jugular Port-A-Cath placement on 06/23/2016. No admitted with staph aureus bacteremia and requiring removal of the Port-A-Cath. EXAM: REMOVAL OF IMPLANTED TUNNELED PORT-A-CATH MEDICATIONS: Moderate (conscious) sedation was employed during this procedure. A total of Versed 2.0 mg and Fentanyl 100 mcg was administered intravenously. Moderate Sedation Time: 25 minutes. The patient's level of consciousness and vital signs were monitored continuously by radiology nursing throughout the procedure under my direct supervision. PROCEDURE: A time-out was performed prior to initiating the procedure. The right chest Port-A-Cath site was prepped with chlorhexidine. A sterile gown and gloves were worn during the procedure. Local anesthesia was provided with 1% lidocaine. An incision was made overlying the Port-A-Cath with a #15 scalpel.  Utilizing sharp and blunt dissection, the Port-A-Cath was removed. Fluid was aspirated from the port pocket via a dilator and sent for culture analysis. Portable cautery was utilized. Retention sutures were removed. The pocket was irrigated with sterile saline and debrided with wet gauze. The pocket was then packed with iodoform gauze. FINDINGS: With initial incision, there was return of purulent and bloody fluid from the port pocket. Approximately 5 mL was able to be aspirated and sent for culture analysis. The port was removed in its entirety. After vigorous debridement and irrigation of the pocket, the pocket was packed with iodoform gauze. This will be left in for 24 hours and removed. Wet-to-dry dressings will then be performed to let the pocket heal by secondary intention. IMPRESSION: Removal of implanted Port-A-Cath utilizing sharp and blunt dissection. There is evidence of port pocket infection with bloody and purulent fluid aspirated from the pocket. A sample was sent for culture analysis. The pocket was packed with iodoform gauze after port removal and will be allowed to heal secondarily. Electronically Signed   By: Aletta Edouard M.D.   On: 12/06/2016 13:48   Dg Chest Port 1 View  Result Date: 12/07/2016 CLINICAL DATA:  Status post right-sided thoracentesis EXAM: PORTABLE CHEST 1 VIEW COMPARISON:  Chest radiograph and chest CT  December 06, 2016 FINDINGS: Right pleural effusion is smaller following thoracentesis. No pneumothorax. There is a small pleural effusion on each side currently. There is atelectatic change in the lung bases. Heart is upper normal in size with pulmonary vascularity within normal limits. There is aortic atherosclerosis. No adenopathy. No evident bone lesions. IMPRESSION: No pneumothorax. Small pleural effusions bilaterally with bibasilar atelectasis. Heart is upper normal in size. No adenopathy evident. There is aortic atherosclerosis. Aortic Atherosclerosis (ICD10-I70.0).  Electronically Signed   By: Lowella Grip III M.D.   On: 12/07/2016 10:54   Dg Chest Port 1 View  Result Date: 12/06/2016 CLINICAL DATA:  Followup sepsis. EXAM: PORTABLE CHEST 1 VIEW COMPARISON:  11/14/2016 FINDINGS: Power port is unchanged with its tip in the SVC. Widespread bilateral pulmonary infiltrates persist, more extensive on the right than the left, with layering right pleural effusion. Probable slight worsening of patchy airspace density. IMPRESSION: Slight worsening of bilateral airspace density/infiltrate more extensive on the right than the left. Associated right effusion. Electronically Signed   By: Nelson Chimes M.D.   On: 12/06/2016 07:04    Scheduled Meds: . chlorhexidine  15 mL Mouth Rinse BID  . enoxaparin (LOVENOX) injection  40 mg Subcutaneous Q24H  . feeding supplement (OSMOLITE 1.5 CAL)  237 mL Per Tube 6 X Daily  . free water  100 mL Per Tube Q4H  . gabapentin  300 mg Per Tube TID  . mouth rinse  15 mL Mouth Rinse q12n4p  . senna-docusate  1 tablet Oral BID   Continuous Infusions: . 0.45 % NaCl with KCl 20 mEq / L 100 mL/hr at 12/07/16 0818  . sodium chloride    .  ceFAZolin (ANCEF) IV Stopped (12/07/16 0949)     LOS: 3 days   Time spent: 25 minutes.  Vance Gather, MD [facility] Pager [phone]  If 7PM-7AM, please contact night-coverage [email or web address] Password [email or web address] 12/07/2016, 4:01 PM

## 2016-12-07 NOTE — Progress Notes (Signed)
Name: Antonio [name] MRN: [id number] DOB: [birth year]    ADMISSION DATE:  12/05/2016 CONSULTATION DATE:  12/05/2016  REFERRING MD :  Dr. Bonner Puna  CHIEF COMPLAINT:  Sepsis   HISTORY OF PRESENT ILLNESS: 61 y.o. 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. Status post carboplatin and paclitaxel on 10/5. Completed radiation to the right lung and larynx on 7/5. Known poor prognosis & treatment is strictly palliative in nature. Underlying COPD and dysphagia status post PEG. Presented 10/22 with weight loss, decreased appetite, and multiple falls. Overall generalized pain & throat pain. Picture of sepsis with right sided findings concerning for possible pneumonia. Hypotension with responsiveness to IV fluid bolus. Found to have MSSA bacteremia. ID consulted & patient's antibiotics narrowed to Ancef.   SUBJECTIVE:  Patient complaining of lower back pain. Denies any other chest pain or pressure. Minimal cough. No significant change in dyspnea.  REVIEW OF SYSTEMS:  No subjective fever or chills. No abdominal pain or nausea.  VITAL SIGNS: Temp:  [98.2 F (36.8 C)-101 F (38.3 C)] 98.7 F (37.1 C) (10/25 0356) Pulse Rate:  [112-116] 116 (10/24 1230) Resp:  [17-29] 25 (10/25 0700) BP: (103-168)/(60-86) 143/64 (10/25 0700) SpO2:  [85 %-100 %] 98 % (10/25 0700) Weight:  [129 lb 6.6 oz (58.7 kg)] 129 lb 6.6 oz (58.7 kg) (10/25 0337)  PHYSICAL EXAMINATION: General:  Sitting up in chair. No distress. Comfortable. Integument:  No rash. Warm. Dry. Extremities:  No cyanosis or clubbing.  HEENT:  No scleral icterus. Moist membranes. Cardiovascular:  Regular rate. Unable to appreciate JVD.  Pulmonary:  Diminished breath sounds bilateral lung bases right greater than left. Normal work of breathing on nasal cannula oxygen Abdomen: Soft. Normal bowel sounds. PEG tube in place. Neurological:   Grossly nonfocal. Cranial nerves grossly intact. No meningismus.    Recent Labs Lab 12/05/16 2022 12/06/16 0405 12/07/16 0327  NA 137 139 140  K 2.8* 4.0 2.9*  CL 105 110 107  CO2 23 23 26   BUN 21* 20 15  CREATININE 0.75 0.69 0.66  GLUCOSE 168* 110* 113*    Recent Labs Lab 12/05/16 0244 12/06/16 0405 12/07/16 0327  HGB 7.4* 8.7* 9.6*  HCT 21.6* 24.8* 27.5*  WBC 2.8* 2.6* 3.1*  PLT 139* 126* 139*   Ct Chest Wo Contrast  Result Date: 12/06/2016 CLINICAL DATA:  Acute onset of generalized weakness. Status post fall. Confusion. Current history of supraglottic laryngeal cancer, status post chemotherapy and radiation therapy. EXAM: CT CHEST WITHOUT CONTRAST TECHNIQUE: Multidetector CT imaging of the chest was performed following the standard protocol without IV contrast. COMPARISON:  CT of the chest performed 11/13/2016 FINDINGS: Cardiovascular: Diffuse coronary artery calcifications are seen. The heart remains normal in size. Mild calcification is noted along the aortic arch and proximal great vessels. Mediastinum/Nodes: Enlarged mediastinal and bilateral hilar nodes are again seen, less well characterized without contrast but concerning for metastatic disease. As before, mediastinal nodes measure up to 2.3 cm in short axis. Trace pericardial fluid remains within normal limits. The thyroid gland is unremarkable. No axillary lymphadenopathy is seen. Lungs/Pleura: Small to moderate right and small left pleural effusions are noted. There is partial consolidation of the right lower lobe, concerning for pneumonia. Nodular opacities are noted within both lungs, concerning for metastatic disease. Mild underlying interstitial prominence is noted. No pneumothorax is identified. Upper Abdomen: Vague hypodensities within the hepatic dome measure up to 2.8 cm  in size, concerning for metastatic disease. The spleen is unremarkable in appearance. The visualized portions of the gallbladder, pancreas,  adrenal glands and kidneys are within normal limits. Nonspecific perinephric stranding is noted bilaterally. A G-tube is noted ending at the body of the stomach. Scattered calcification is seen along the proximal abdominal aorta and its branches. Musculoskeletal: No acute osseous abnormalities are identified. Packing material is noted at a soft tissue defect at the right chest wall, status post removal of the patient's right-sided chest port. The visualized musculature is unremarkable in appearance. IMPRESSION: 1. Small to moderate right and small left pleural effusions. Increased partial consolidation of the right lower lung lobe, concerning for pneumonia. 2. Nodular opacities within both lungs, concerning for metastatic disease. 3. Enlarged mediastinal and bilateral hilar nodes remain concerning for metastatic disease. 4. Vague hypodensities at the hepatic dome measuring 2.8 cm in size, concerning for metastatic disease. 5. Scattered aortic atherosclerosis. 6. Diffuse coronary artery calcifications. 7. Packing material at a soft tissue defect at the right chest wall, status post removal of right-sided chest port. Electronically Signed   By: Garald Balding M.D.   On: 12/06/2016 22:48   Ir Removal [employer] W/ [place] W/o Fl Mod Sed  Result Date: 12/06/2016 CLINICAL DATA:  History of laryngeal carcinoma and status post right jugular Port-A-Cath placement on 06/23/2016. No admitted with staph aureus bacteremia and requiring removal of the Port-A-Cath. EXAM: REMOVAL OF IMPLANTED TUNNELED PORT-A-CATH MEDICATIONS: Moderate (conscious) sedation was employed during this procedure. A total of Versed 2.0 mg and Fentanyl 100 mcg was administered intravenously. Moderate Sedation Time: 25 minutes. The patient's level of consciousness and vital signs were monitored continuously by radiology nursing throughout the procedure under my direct supervision. PROCEDURE: A time-out was performed prior to initiating the procedure. The  right chest Port-A-Cath site was prepped with chlorhexidine. A sterile gown and gloves were worn during the procedure. Local anesthesia was provided with 1% lidocaine. An incision was made overlying the Port-A-Cath with a #15 scalpel. Utilizing sharp and blunt dissection, the Port-A-Cath was removed. Fluid was aspirated from the port pocket via a dilator and sent for culture analysis. Portable cautery was utilized. Retention sutures were removed. The pocket was irrigated with sterile saline and debrided with wet gauze. The pocket was then packed with iodoform gauze. FINDINGS: With initial incision, there was return of purulent and bloody fluid from the port pocket. Approximately 5 mL was able to be aspirated and sent for culture analysis. The port was removed in its entirety. After vigorous debridement and irrigation of the pocket, the pocket was packed with iodoform gauze. This will be left in for 24 hours and removed. Wet-to-dry dressings will then be performed to let the pocket heal by secondary intention. IMPRESSION: Removal of implanted Port-A-Cath utilizing sharp and blunt dissection. There is evidence of port pocket infection with bloody and purulent fluid aspirated from the pocket. A sample was sent for culture analysis. The pocket was packed with iodoform gauze after port removal and will be allowed to heal secondarily. Electronically Signed   By: Aletta Edouard M.D.   On: 12/06/2016 13:48   Dg Chest Port 1 View  Result Date: 12/06/2016 CLINICAL DATA:  Followup sepsis. EXAM: PORTABLE CHEST 1 VIEW COMPARISON:  12/10/2016 FINDINGS: Power port is unchanged with its tip in the SVC. Widespread bilateral pulmonary infiltrates persist, more extensive on the right than the left, with layering right pleural effusion. Probable slight worsening of patchy airspace density. IMPRESSION: Slight worsening of bilateral  airspace density/infiltrate more extensive on the right than the left. Associated right effusion.  Electronically Signed   By: Nelson Chimes M.D.   On: 12/06/2016 07:04   STUDIES:  CERVICAL LYMPH NODE BIOPSY 05/18/16:  Squamous cell carcinoma  PET CT 06/08/16: IMPRESSION: 1. 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 2. Hypermetabolic right lower lobe pulmonary nodule is favored to represent a synchronous primary bronchogenic neoplasm. Isolated pulmonary metastasis could look similar. No thoracic nodal hypermetabolism. 3. Soft tissue hypermetabolism about the right iliac bone and greater trochanter of the proximal right femur are favored to be degenerative or posttraumatic. Soft tissue metastasis felt highly unlikely. 4.  Coronary artery atherosclerosis. Aortic atherosclerosis. 5. A tiny right lower lobe pulmonary nodule is most likely a subpleural lymph node. CT CHEST W/ CONTRAST 10/1: IMPRESSION: 1. Interim development of multiple enlarged mediastinal and hilar lymph nodes consistent with metastatic disease. 2. Development of multiple bilateral pulmonary nodules, also suspicious for metastatic disease. Suspect that the previously noted right lower lobe lung mass has increased in size, now measuring about 26 mm compared with 19 mm previously. 3. New small right-sided pleural effusion. Patchy consolidations in the right lower lobe, uncertain if this represents pneumonia, aspiration, or metastatic nodules. 4. Subcentimeter hypodense foci in the liver, too small to further characterize. When the patient is clinically stable and able to follow directions and hold their breath (preferably as an outpatient) further evaluation with dedicated abdominal MRI may be considered. PORT CXR 10/24:  Previously reviewed by me. Persistent right basilar opacification with silhouetting of hemidiaphragm & fullness to right hilum. Streaky opacification with some suggestion of air bronchogram and consolidation within the left lower lung as well. Unable  to appreciate pleural effusion meniscus. Right-sided port in place. CT CHEST W/O 10/25:  Personally reviewed by me. Small left and moderate sided pleural effusions. Pathologically enlarged precarinal and subcarinal lymph nodes. Bilateral nodular opacities within aerated lung. Some associated compressive atelectasis in the right lower lobe versus consolidation with air bronchograms. Vague hypodensities in the hepatic dome.  MICROBIOLOGY:  Blood Cultures x2 10/22 >>> 2/2 Bottles Positive GPC (MSSA on PCR) Sputum Culture 10/22 >>> Group A Strep Culture 10/23 >>> MRSA PCR 10/23:  Negative Blood Culture x1 10/24 >>>  ANTIBIOTICS: Zosyn 10/22 (x1 dose) Vancomycin 10/22 (x1 dose) Cefepime 10/23 (x2 doses) Ancef 10/23 >>>  SIGNIFICANT EVENTS  10/22 - Admitted 10/23 - Transferred to SDU w/ repeat episode hypotension  ASSESSMENT / PLAN:  61 y.o. 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 Patient found to have bilateral pleural effusions right greater than left. Given the multiple parenchymal nodules I am suspicious about the possibility of a malignant pleural effusion; alternatively, these effusions could represent some element of decompensation from congestive heart failure. However, the patient does not appear grossly volume overloaded. Underwent successful removal of implanted port yesterday.   1. Sepsis: Secondary to MSSA bacteremia: Patient continuing on Ancef & antibiotic regimen as per ID and primary team. 2. Bilateral pleural effusions: Plan for right-sided thoracentesis with routine pleural fluid analysis, cytology, and culture today. 3. Hypotension: Resolved with IV fluid. Monitoring vitals per unit protocol. 4. Bilateral nodular opacities: Seen on CT imaging. Likely metastatic disease.  I have spent a total of 37 minutes of time today caring for the patient, reviewing the patient's electronic medical record, and with more than 50% of that time spent  coordinating care with the patient as well as  reviewing the continuing plan of care with the patient and nurse at bedside.  Remainder of care as per primary service and other consultants.  Sonia Baller Ashok Cordia, M.D. [facility] Pager:  [phone] After 7pm or if no response, call [phone] 8:39 AM 12/07/16

## 2016-12-07 NOTE — Procedures (Signed)
Thoracentesis Procedure Note  Pre-operative Diagnosis: Right Pleural Effusion   Post-operative Diagnosis: same  Indications: Diagnostic and therapeutic evaluation of pleural fluid.  Procedure Details  Consent: Informed consent was obtained. Risks of the procedure were discussed including: infection, bleeding, pain, pneumothorax.  Under sterile conditions the patient was positioned. Betadine solution and sterile drapes were utilized.  1% buffered lidocaine was used to anesthetize the 8th rib space. Fluid was obtained without any difficulties and minimal blood loss.  A dressing was applied to the wound and wound care instructions were provided.   Findings 1.5 L of cloudy rusty/red pleural fluid was obtained. A sample was sent to Pathology for cytogenetics, flow, and cell counts, as well as for infection analysis.  Complications:  None; patient tolerated the procedure well.          Condition: stable  Plan A follow up chest x-ray was ordered.   Procedure performed under direct supervision of Dr. Ashok Cordia and with ultrasound guidance.    Noe Gens, NP-C [place] [facility] Pgr: [phone] or if no answer [phone] 12/07/2016, 10:26 AM

## 2016-12-07 NOTE — Progress Notes (Signed)
Patient ID: Antonio [name], male   DOB: [birth year], 61 y.o.   MRN: [id number]    Referring Physician(s): Dr. Lita Mains  Supervising Physician: Markus Daft  Patient Status: [facility], [place] - In-pt  Chief Complaint: Infected PAC  Subjective: Patient feeling ok today with no new complaints.  No pain at chest site currently  Allergies: Patient has no known allergies.  Medications: Prior to Admission medications   Medication Sig Start Date End Date Taking? Authorizing Provider  gabapentin (NEURONTIN) 300 MG capsule Take 1 capsule (300 mg total) by mouth 3 (three) times daily. 08/24/16  Yes Gorsuch, Ni, MD  lidocaine-prilocaine (EMLA) cream Apply to affected area once 06/14/16  Yes Gorsuch, Ni, MD  morphine (MSIR) 15 MG tablet Take 1 tablet (15 mg total) by mouth every 4 (four) hours as needed for severe pain. 11/23/16  Yes [name], MD  Nutritional Supplements (FEEDING SUPPLEMENT, OSMOLITE 1.5 CAL,) LIQD Begin Osmolite 1.5 via PEG TID with 60 mL free water. Advance slowly to  goal of 2 bottles QID with 60 mL free water before and after. In addition, drink or flush PEG with 240 mL free water TID. 11/19/16  Yes Florencia Reasons, MD  senna-docusate (SENOKOT-S) 8.6-50 MG tablet Take 1 tablet by mouth 2 (two) times daily. 06/14/16  Yes [name], MD  Water For Irrigation, Sterile (FREE WATER) SOLN Place 100 mLs into feeding tube every 4 (four) hours. 11/19/16  Yes Florencia Reasons, MD  ondansetron (ZOFRAN) 8 MG tablet Take 1 tablet (8 mg total) by mouth 2 (two) times daily as needed. Start on the third day after chemotherapy. Patient not taking: Reported on 10/18/2016 06/14/16   [name], MD    Vital Signs: BP (!) 143/67   Pulse (!) 116   Temp 99.4 F (37.4 C) (Oral)   Resp (!) 21   Ht 5\' 3"  (1.6 m)   Wt 129 lb 6.6 oz (58.7 kg)   SpO2 99%   BMI 22.92 kg/m   Physical Exam: Chest: RUC with packing in place.  Site is otherwise clean.  No purulent drainage noted.  Imaging: Dg Chest 2 View  Result Date:  12/11/2016 CLINICAL DATA:  Generalized pain EXAM: CHEST  2 VIEW COMPARISON:  06/23/2005 FINDINGS: Asymmetric elevation right hemidiaphragm with right base collapse/consolidation and small right pleural effusion. The cardiopericardial silhouette is within normal limits for size. Right Port-A-Cath tip projects distal SVC level. Gastrostomy tube overlies the left upper quadrant. Degenerative changes are noted in the right shoulder. IMPRESSION: Right base collapse/ consolidation with small right pleural effusion. Aspiration and/ or infection could cause this appearance. Given the history of multiple pulmonary nodule seen on recent CT scan, malignant effusion also consideration. Electronically Signed   By: Misty Stanley M.D.   On: 12/03/2016 20:14   Ct Chest Wo Contrast  Result Date: 12/06/2016 CLINICAL DATA:  Acute onset of generalized weakness. Status post fall. Confusion. Current history of supraglottic laryngeal cancer, status post chemotherapy and radiation therapy. EXAM: CT CHEST WITHOUT CONTRAST TECHNIQUE: Multidetector CT imaging of the chest was performed following the standard protocol without IV contrast. COMPARISON:  CT of the chest performed 11/13/2016 FINDINGS: Cardiovascular: Diffuse coronary artery calcifications are seen. The heart remains normal in size. Mild calcification is noted along the aortic arch and proximal great vessels. Mediastinum/Nodes: Enlarged mediastinal and bilateral hilar nodes are again seen, less well characterized without contrast but concerning for metastatic disease. As before, mediastinal nodes measure up to 2.3 cm in short axis. Trace pericardial fluid remains  within normal limits. The thyroid gland is unremarkable. No axillary lymphadenopathy is seen. Lungs/Pleura: Small to moderate right and small left pleural effusions are noted. There is partial consolidation of the right lower lobe, concerning for pneumonia. Nodular opacities are noted within both lungs, concerning  for metastatic disease. Mild underlying interstitial prominence is noted. No pneumothorax is identified. Upper Abdomen: Vague hypodensities within the hepatic dome measure up to 2.8 cm in size, concerning for metastatic disease. The spleen is unremarkable in appearance. The visualized portions of the gallbladder, pancreas, adrenal glands and kidneys are within normal limits. Nonspecific perinephric stranding is noted bilaterally. A G-tube is noted ending at the body of the stomach. Scattered calcification is seen along the proximal abdominal aorta and its branches. Musculoskeletal: No acute osseous abnormalities are identified. Packing material is noted at a soft tissue defect at the right chest wall, status post removal of the patient's right-sided chest port. The visualized musculature is unremarkable in appearance. IMPRESSION: 1. Small to moderate right and small left pleural effusions. Increased partial consolidation of the right lower lung lobe, concerning for pneumonia. 2. Nodular opacities within both lungs, concerning for metastatic disease. 3. Enlarged mediastinal and bilateral hilar nodes remain concerning for metastatic disease. 4. Vague hypodensities at the hepatic dome measuring 2.8 cm in size, concerning for metastatic disease. 5. Scattered aortic atherosclerosis. 6. Diffuse coronary artery calcifications. 7. Packing material at a soft tissue defect at the right chest wall, status post removal of right-sided chest port. Electronically Signed   By: Garald Balding M.D.   On: 12/06/2016 22:48   Ir Removal [employer] W/ [place] W/o Fl Mod Sed  Result Date: 12/06/2016 CLINICAL DATA:  History of laryngeal carcinoma and status post right jugular Port-A-Cath placement on 06/23/2016. No admitted with staph aureus bacteremia and requiring removal of the Port-A-Cath. EXAM: REMOVAL OF IMPLANTED TUNNELED PORT-A-CATH MEDICATIONS: Moderate (conscious) sedation was employed during this procedure. A total of Versed  2.0 mg and Fentanyl 100 mcg was administered intravenously. Moderate Sedation Time: 25 minutes. The patient's level of consciousness and vital signs were monitored continuously by radiology nursing throughout the procedure under my direct supervision. PROCEDURE: A time-out was performed prior to initiating the procedure. The right chest Port-A-Cath site was prepped with chlorhexidine. A sterile gown and gloves were worn during the procedure. Local anesthesia was provided with 1% lidocaine. An incision was made overlying the Port-A-Cath with a #15 scalpel. Utilizing sharp and blunt dissection, the Port-A-Cath was removed. Fluid was aspirated from the port pocket via a dilator and sent for culture analysis. Portable cautery was utilized. Retention sutures were removed. The pocket was irrigated with sterile saline and debrided with wet gauze. The pocket was then packed with iodoform gauze. FINDINGS: With initial incision, there was return of purulent and bloody fluid from the port pocket. Approximately 5 mL was able to be aspirated and sent for culture analysis. The port was removed in its entirety. After vigorous debridement and irrigation of the pocket, the pocket was packed with iodoform gauze. This will be left in for 24 hours and removed. Wet-to-dry dressings will then be performed to let the pocket heal by secondary intention. IMPRESSION: Removal of implanted Port-A-Cath utilizing sharp and blunt dissection. There is evidence of port pocket infection with bloody and purulent fluid aspirated from the pocket. A sample was sent for culture analysis. The pocket was packed with iodoform gauze after port removal and will be allowed to heal secondarily. Electronically Signed   By: Aletta Edouard  M.D.   On: 12/06/2016 13:48   Dg Chest Port 1 View  Result Date: 12/06/2016 CLINICAL DATA:  Followup sepsis. EXAM: PORTABLE CHEST 1 VIEW COMPARISON:  11/20/2016 FINDINGS: Power port is unchanged with its tip in the SVC.  Widespread bilateral pulmonary infiltrates persist, more extensive on the right than the left, with layering right pleural effusion. Probable slight worsening of patchy airspace density. IMPRESSION: Slight worsening of bilateral airspace density/infiltrate more extensive on the right than the left. Associated right effusion. Electronically Signed   By: Nelson Chimes M.D.   On: 12/06/2016 07:04    Labs:  CBC:  Recent Labs  11/27/2016 1837 12/05/16 0244 12/06/16 0405 12/07/16 0327  WBC 3.7* 2.8* 2.6* 3.1*  HGB 8.2* 7.4* 8.7* 9.6*  HCT 23.5* 21.6* 24.8* 27.5*  PLT 184 139* 126* 139*    COAGS:  Recent Labs  05/18/16 0642 06/23/16 0954 11/26/2016 1837  INR 1.10 1.01 1.29  APTT 35  --   --     BMP:  Recent Labs  12/05/16 1941 12/05/16 2022 12/06/16 0405 12/07/16 0327  NA 138 137 139 140  K 2.9* 2.8* 4.0 2.9*  CL 104 105 110 107  CO2 23 23 23 26   GLUCOSE 157* 168* 110* 113*  BUN 21* 21* 20 15  CALCIUM 7.4* 7.1* 7.6* 8.0*  CREATININE 0.78 0.75 0.69 0.66  GFRNONAA >60 >60 >60 >60  GFRAA >60 >60 >60 >60    LIVER FUNCTION TESTS:  Recent Labs  11/16/16 0446 11/29/16 1037 12/09/2016 1837 12/06/16 0405  BILITOT 0.3 0.82 1.3* 0.9  AST 17 32 38 53*  ALT 13* 50 57 52  ALKPHOS 55 90 91 90  PROT 6.1* 6.6 6.3* 5.2*  ALBUMIN 3.0* 2.7* 2.5* 2.0*    Assessment and Plan: 1. Infected PAC, s/p removal  Cont NS WD dressing changes daily as ordered starting today.  These will need to be continued until site is healed. abx therapy per ID and primary service. Cx shows gram + cocci in pairs currently Will follow wound  Electronically Signed: Scarlet Abad E 12/07/2016, 9:40 AM   I spent a total of 15 Minutes at the the patient's bedside AND on the patient's hospital floor or unit, greater than 50% of which was counseling/coordinating care for infected [facility]

## 2016-12-07 NOTE — Progress Notes (Signed)
PT Cancellation Note  Patient Details Name: Antonio [name] MRN: [id number] DOB: 20-Oct-1955   Cancelled Treatment:    Reason Eval/Treat Not Completed: Medical issues which prohibited therapy. Patient in recliner, requesting medication for his legs. Will check back later time.   Marcelino Freestone PT [phone]  12/07/2016, 8:58 AM

## 2016-12-07 NOTE — Progress Notes (Addendum)
INFECTIOUS DISEASE PROGRESS NOTE  ID: Antonio [name] is a 61 y.o. 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   Abtx:  Anti-infectives    Start     Dose/Rate Route Frequency Ordered Stop   12/06/16 1000  vancomycin (VANCOCIN) IVPB 750 mg/150 ml premix  Status:  Discontinued     750 mg 150 mL/hr over 60 Minutes Intravenous Every 12 hours 12/05/16 0008 12/05/16 0009   12/05/16 1800  ceFAZolin (ANCEF) IVPB 2g/100 mL premix     2 g 200 mL/hr over 30 Minutes Intravenous Every 8 hours 12/05/16 1122     12/05/16 1000  vancomycin (VANCOCIN) IVPB 750 mg/150 ml premix  Status:  Discontinued     750 mg 150 mL/hr over 60 Minutes Intravenous Every 12 hours 12/05/16 0009 12/05/16 1118   12/05/16 0200  ceFEPIme (MAXIPIME) 1 g in dextrose 5 % 50 mL IVPB  Status:  Discontinued     1 g 100 mL/hr over 30 Minutes Intravenous Every 8 hours 12/05/16 0009 12/05/16 1118   12/10/2016 1930  vancomycin (VANCOCIN) IVPB 1000 mg/200 mL premix     1,000 mg 200 mL/hr over 60 Minutes Intravenous  Once 11/29/2016 1915 11/17/2016 2238   12/07/2016 1930  piperacillin-tazobactam (ZOSYN) IVPB 3.375 g     3.375 g 100 mL/hr over 30 Minutes Intravenous  Once 11/29/2016 1915 12/08/2016 2001      Medications:  Scheduled: . chlorhexidine  15 mL Mouth Rinse BID  . enoxaparin (LOVENOX) injection  40 mg Subcutaneous Q24H  . feeding supplement (OSMOLITE 1.5 CAL)  237 mL Per Tube 6 X Daily  . free water  100 mL Per Tube Q4H  . gabapentin  300 mg  Per Tube TID  . mouth rinse  15 mL Mouth Rinse q12n4p  . senna-docusate  1 tablet Oral BID    Objective: Vital signs in last 24 hours: Temp:  [98.2 F (36.8 C)-101 F (38.3 C)] 99.4 F (37.4 C) (10/25 0800) Pulse Rate:  [112-116] 116 (10/24 1230) Resp:  [17-29] 21 (10/25 0914) BP: (103-168)/(60-86) 143/67 (10/25 0914) SpO2:  [85 %-100 %] 99 % (10/25 0914) Weight:  [58.7 kg (129 lb 6.6 oz)] 58.7 kg (129 lb 6.6 oz) (10/25 4174)   General appearance: alert, cooperative and no distress Resp: rhonchi bilaterally Chest wall: no tenderness, wound is packed, clean.  Cardio: tachycardia GI: normal findings: bowel sounds normal and soft, non-tender  Lab Results  Recent Labs  12/06/16 0405 12/07/16 0327  WBC 2.6* 3.1*  HGB 8.7* 9.6*  HCT 24.8* 27.5*  NA 139 140  K 4.0 2.9*  CL 110 107  CO2 23 26  BUN 20 15  CREATININE 0.69 0.66   Liver Panel  Recent Labs  12/03/2016 1837 12/06/16 0405  PROT 6.3* 5.2*  ALBUMIN 2.5* 2.0*  AST 38 53*  ALT 57 52  ALKPHOS 91 90  BILITOT 1.3* 0.9   Sedimentation Rate No results for input(s): ESRSEDRATE in the last 72 hours.  C-Reactive Protein No results for input(s): CRP in the last 72 hours.  Microbiology: Recent Results (from the past 240 hour(s))  Culture, blood (Routine x 2)     Status: Abnormal   Collection Time: 11/17/2016  6:37 PM  Result Value Ref Range Status   Specimen Description BLOOD RIGHT ANTECUBITAL  Final   Special Requests   Final    BOTTLES DRAWN AEROBIC AND ANAEROBIC Blood Culture adequate volume   Culture  Setup Time   Final    GRAM POSITIVE COCCI IN BOTH AEROBIC AND ANAEROBIC BOTTLES CRITICAL RESULT CALLED TO, READ BACK BY AND VERIFIED WITH: M. Chanetta Marshall.D. 10:55 12/05/16 ([email or web address]) Performed at [facility], [place] [address] [place], [place] [address]    Culture STAPHYLOCOCCUS AUREUS (A)  Final   Report Status 12/07/2016 FINAL  Final   Organism ID, Bacteria STAPHYLOCOCCUS AUREUS  Final       Susceptibility   Staphylococcus aureus - MIC*    CIPROFLOXACIN <=0.5 SENSITIVE Sensitive     ERYTHROMYCIN <=0.25 SENSITIVE Sensitive     GENTAMICIN <=0.5 SENSITIVE Sensitive     OXACILLIN 0.5 SENSITIVE Sensitive     TETRACYCLINE <=1 SENSITIVE Sensitive     VANCOMYCIN 1 SENSITIVE Sensitive     TRIMETH/SULFA <=10 SENSITIVE Sensitive     CLINDAMYCIN <=0.25 SENSITIVE Sensitive     RIFAMPIN <=0.5 SENSITIVE Sensitive     Inducible Clindamycin NEGATIVE Sensitive     * STAPHYLOCOCCUS AUREUS  Blood Culture ID Panel (Reflexed)     Status: Abnormal   Collection Time: 11/17/2016  6:37 PM  Result Value Ref Range Status   Enterococcus species NOT DETECTED NOT DETECTED Final   Listeria monocytogenes NOT DETECTED NOT DETECTED Final   Staphylococcus species DETECTED (A) NOT DETECTED Final    Comment: CRITICAL RESULT CALLED TO, READ BACK BY AND VERIFIED WITH: M. Chanetta Marshall.D. 10:55 12/05/16 ([email or web address])    Staphylococcus aureus DETECTED (A) NOT DETECTED Final    Comment: Methicillin (oxacillin) susceptible Staphylococcus aureus (MSSA). Preferred therapy is anti staphylococcal beta lactam antibiotic (Cefazolin or Nafcillin), unless clinically contraindicated. CRITICAL RESULT CALLED TO, READ BACK BY AND VERIFIED WITH: M. Lear Ng Pharm.D. 10:55 12/05/16 ([email or web address])    Methicillin resistance NOT DETECTED NOT DETECTED Final   Streptococcus species NOT DETECTED NOT DETECTED Final   Streptococcus agalactiae NOT DETECTED NOT DETECTED Final   Streptococcus pneumoniae NOT DETECTED NOT DETECTED Final   Streptococcus pyogenes NOT DETECTED NOT DETECTED Final   Acinetobacter baumannii NOT DETECTED NOT DETECTED Final   Enterobacteriaceae species NOT DETECTED NOT DETECTED Final   Enterobacter cloacae complex NOT DETECTED NOT DETECTED Final   Escherichia coli NOT DETECTED NOT DETECTED Final   Klebsiella oxytoca NOT DETECTED NOT DETECTED Final   Klebsiella pneumoniae NOT DETECTED NOT DETECTED Final   Proteus species NOT  DETECTED NOT DETECTED Final   Serratia marcescens NOT DETECTED NOT DETECTED Final   Haemophilus influenzae NOT DETECTED NOT DETECTED Final   Neisseria meningitidis NOT DETECTED NOT DETECTED Final   Pseudomonas aeruginosa NOT DETECTED NOT DETECTED Final   Candida albicans NOT DETECTED NOT DETECTED Final   Candida glabrata NOT DETECTED NOT DETECTED Final   Candida krusei NOT DETECTED NOT DETECTED Final   Candida parapsilosis NOT DETECTED NOT DETECTED Final   Candida tropicalis NOT DETECTED NOT DETECTED Final    Comment: Performed at [facility], [address] [place],  [address]  Culture, blood (Routine x 2)     Status: Abnormal   Collection Time: 11/22/2016  6:40 PM  Result Value Ref Range Status   Specimen Description BLOOD RIGHT FOREARM  Final   Special Requests   Final    BOTTLES DRAWN AEROBIC AND ANAEROBIC Blood Culture adequate volume   Culture  Setup Time   Final    GRAM POSITIVE COCCI IN BOTH AEROBIC AND ANAEROBIC BOTTLES CRITICAL VALUE NOTED.  VALUE IS CONSISTENT WITH PREVIOUSLY REPORTED AND CALLED VALUE.    Culture (A)  Final    STAPHYLOCOCCUS AUREUS SUSCEPTIBILITIES PERFORMED ON PREVIOUS CULTURE WITHIN THE LAST 5 DAYS. Performed at [facility], [place] [address], [place], [place] [address]    Report Status 12/07/2016 FINAL  Final  Culture, sputum-assessment     Status: None   Collection Time: 12/03/2016 11:40 PM  Result Value Ref Range Status   Specimen Description SPUTUM  Final   Special Requests Immunocompromised  Final   Sputum evaluation THIS SPECIMEN IS ACCEPTABLE FOR SPUTUM CULTURE  Final   Report Status 12/05/2016 FINAL  Final  Culture, respiratory (NON-Expectorated)     Status: None (Preliminary result)   Collection Time: 12/11/2016 11:40 PM  Result Value Ref Range Status   Specimen Description SPUTUM  Final   Special Requests Immunocompromised Reflexed from [id number]  Final   Gram Stain   Final    RARE WBC PRESENT,BOTH PMN AND  MONONUCLEAR ABUNDANT SQUAMOUS EPITHELIAL CELLS PRESENT MODERATE GRAM POSITIVE COCCI MODERATE GRAM POSITIVE RODS MODERATE GRAM NEGATIVE RODS    Culture   Final    CULTURE REINCUBATED FOR BETTER GROWTH Performed at [facility], [place] [address] [place], [place] [address]    Report Status PENDING  Incomplete  Rapid strep screen     Status: None   Collection Time: 12/05/16  4:04 PM  Result Value Ref Range Status   Streptococcus, Group A Screen (Direct) NEGATIVE NEGATIVE Final    Comment: (NOTE) A Rapid Antigen test may result negative if the antigen level in the sample is below the detection level of this test. The FDA has not cleared this test as a stand-alone test therefore the rapid antigen negative result has reflexed to a Group A Strep culture.   Culture, group A strep     Status: None (Preliminary result)   Collection Time: 12/05/16  4:04 PM  Result Value Ref Range Status   Specimen Description THROAT  Final   Special Requests NONE Reflexed from [id number]  Final   Culture   Final    CULTURE REINCUBATED FOR BETTER GROWTH Performed at [facility], [address], [place], [place] [address]    Report Status PENDING  Incomplete  MRSA PCR Screening     Status: None   Collection Time: 12/05/16  8:20 PM  Result Value Ref Range Status   MRSA by PCR NEGATIVE NEGATIVE Final    Comment:        The GeneXpert MRSA Assay (FDA approved for NASAL specimens only), is one component of a comprehensive MRSA colonization surveillance program. It is not intended to diagnose MRSA infection nor to guide or monitor treatment for MRSA infections.   Aerobic/Anaerobic Culture (surgical/deep wound)     Status: None (Preliminary result)   Collection Time: 12/06/16 12:41 PM  Result Value Ref Range Status   Specimen Description WOUND RIGHT CHEST  Final   Special Requests Normal  Final   Gram Stain   Final    ABUNDANT WBC PRESENT, PREDOMINANTLY PMN ABUNDANT GRAM POSITIVE COCCI IN  PAIRS Performed at [facility],  [address] [place], [place] [address]    Culture PENDING  Incomplete   Report Status PENDING  Incomplete    Studies/Results: Ct Chest Wo Contrast  Result Date: 12/06/2016 CLINICAL DATA:  Acute onset of generalized weakness. Status post fall. Confusion. Current history of supraglottic laryngeal cancer, status post chemotherapy and radiation therapy. EXAM: CT CHEST WITHOUT CONTRAST TECHNIQUE: Multidetector CT imaging of the chest was performed following the standard protocol without IV contrast. COMPARISON:  CT of the chest performed 11/13/2016 FINDINGS: Cardiovascular: Diffuse coronary artery calcifications are seen. The heart remains normal in size. Mild calcification is noted along the aortic arch and proximal great vessels. Mediastinum/Nodes: Enlarged mediastinal and bilateral hilar nodes are again seen, less well characterized without contrast but concerning for metastatic disease. As before, mediastinal nodes measure up to 2.3 cm in short axis. Trace pericardial fluid remains within normal limits. The thyroid gland is unremarkable. No axillary lymphadenopathy is seen. Lungs/Pleura: Small to moderate right and small left pleural effusions are noted. There is partial consolidation of the right lower lobe, concerning for pneumonia. Nodular opacities are noted within both lungs, concerning for metastatic disease. Mild underlying interstitial prominence is noted. No pneumothorax is identified. Upper Abdomen: Vague hypodensities within the hepatic dome measure up to 2.8 cm in size, concerning for metastatic disease. The spleen is unremarkable in appearance. The visualized portions of the gallbladder, pancreas, adrenal glands and kidneys are within normal limits. Nonspecific perinephric stranding is noted bilaterally. A G-tube is noted ending at the body of the stomach. Scattered calcification is seen along the proximal abdominal aorta and its branches.  Musculoskeletal: No acute osseous abnormalities are identified. Packing material is noted at a soft tissue defect at the right chest wall, status post removal of the patient's right-sided chest port. The visualized musculature is unremarkable in appearance. IMPRESSION: 1. Small to moderate right and small left pleural effusions. Increased partial consolidation of the right lower lung lobe, concerning for pneumonia. 2. Nodular opacities within both lungs, concerning for metastatic disease. 3. Enlarged mediastinal and bilateral hilar nodes remain concerning for metastatic disease. 4. Vague hypodensities at the hepatic dome measuring 2.8 cm in size, concerning for metastatic disease. 5. Scattered aortic atherosclerosis. 6. Diffuse coronary artery calcifications. 7. Packing material at a soft tissue defect at the right chest wall, status post removal of right-sided chest port. Electronically Signed   By: Garald Balding M.D.   On: 12/06/2016 22:48   Ir Removal [employer] W/ [place] W/o Fl Mod Sed  Result Date: 12/06/2016 CLINICAL DATA:  History of laryngeal carcinoma and status post right jugular Port-A-Cath placement on 06/23/2016. No admitted with staph aureus bacteremia and requiring removal of the Port-A-Cath. EXAM: REMOVAL OF IMPLANTED TUNNELED PORT-A-CATH MEDICATIONS: Moderate (conscious) sedation was employed during this procedure. A total of Versed 2.0 mg and Fentanyl 100 mcg was administered intravenously. Moderate Sedation Time: 25 minutes. The patient's level of consciousness and vital signs were monitored continuously by radiology nursing throughout the procedure under my direct supervision. PROCEDURE: A time-out was performed prior to initiating the procedure. The right chest Port-A-Cath site was prepped with chlorhexidine. A sterile gown and gloves were worn during the procedure. Local anesthesia was provided with 1% lidocaine. An incision was made overlying the Port-A-Cath with a #15 scalpel. Utilizing  sharp and blunt dissection, the Port-A-Cath was removed. Fluid was aspirated from the port pocket via a dilator and sent for culture analysis. Portable cautery was utilized. Retention sutures were removed. The pocket was irrigated with  sterile saline and debrided with wet gauze. The pocket was then packed with iodoform gauze. FINDINGS: With initial incision, there was return of purulent and bloody fluid from the port pocket. Approximately 5 mL was able to be aspirated and sent for culture analysis. The port was removed in its entirety. After vigorous debridement and irrigation of the pocket, the pocket was packed with iodoform gauze. This will be left in for 24 hours and removed. Wet-to-dry dressings will then be performed to let the pocket heal by secondary intention. IMPRESSION: Removal of implanted Port-A-Cath utilizing sharp and blunt dissection. There is evidence of port pocket infection with bloody and purulent fluid aspirated from the pocket. A sample was sent for culture analysis. The pocket was packed with iodoform gauze after port removal and will be allowed to heal secondarily. Electronically Signed   By: Aletta Edouard M.D.   On: 12/06/2016 13:48   Dg Chest Port 1 View  Result Date: 12/06/2016 CLINICAL DATA:  Followup sepsis. EXAM: PORTABLE CHEST 1 VIEW COMPARISON:  12/01/2016 FINDINGS: Power port is unchanged with its tip in the SVC. Widespread bilateral pulmonary infiltrates persist, more extensive on the right than the left, with layering right pleural effusion. Probable slight worsening of patchy airspace density. IMPRESSION: Slight worsening of bilateral airspace density/infiltrate more extensive on the right than the left. Associated right effusion. Electronically Signed   By: Nelson Chimes M.D.   On: 12/06/2016 07:04     Assessment/Plan: Infected port Methicillin sensitive Staph aureus bacteremia Supraglottic Squamous Cell cancer dx 05-2016 XRT Cisplatinin  06-2016 to 07-2016 Carboplatinin-Taxol 11-17-16 --> Has been eval by palliative COPD [facility]  Polysubstance abuse (crack, marijuana, ETOH) HIV (-) 05-2016 Hypokalemia  Total days of antibiotics: 3 ancef  Would consider TEE if he can tolerate, to help determine length of therapy.   CT with some concern for pna. He is asx with regards to this.  Afeb since 10-23 and BP improved.  WBC slightly better Hypokalemia to be repleted by primary team.  Await repeat BCx (sent 10-24) Cx of port site g/s staph TTE does not specifically mention valve vegetations.   Appreciate IR removal of port Repeat Hep and HIV all negative         Bobby Rumpf MD, FACP Infectious Diseases (pager) [phone] [email or web address] 12/07/2016, 9:21 AM  LOS: 3 days

## 2016-12-08 DIAGNOSIS — Z7189 Other specified counseling: Secondary | ICD-10-CM

## 2016-12-08 DIAGNOSIS — J90 Pleural effusion, not elsewhere classified: Secondary | ICD-10-CM

## 2016-12-08 DIAGNOSIS — Z515 Encounter for palliative care: Secondary | ICD-10-CM

## 2016-12-08 LAB — CBC
HEMATOCRIT: 28 % — AB (ref 39.0–52.0)
HEMOGLOBIN: 9.5 g/dL — AB (ref 13.0–17.0)
MCH: 26.8 pg (ref 26.0–34.0)
MCHC: 33.9 g/dL (ref 30.0–36.0)
MCV: 78.9 fL (ref 78.0–100.0)
Platelets: 173 10*3/uL (ref 150–400)
RBC: 3.55 MIL/uL — AB (ref 4.22–5.81)
RDW: 16.5 % — ABNORMAL HIGH (ref 11.5–15.5)
WBC: 5.2 10*3/uL (ref 4.0–10.5)

## 2016-12-08 LAB — GLUCOSE, CAPILLARY
GLUCOSE-CAPILLARY: 136 mg/dL — AB (ref 65–99)
Glucose-Capillary: 84 mg/dL (ref 65–99)
Glucose-Capillary: 146 mg/dL — ABNORMAL HIGH (ref 65–99)
Glucose-Capillary: 94 mg/dL (ref 65–99)

## 2016-12-08 LAB — BASIC METABOLIC PANEL
ANION GAP: 8 (ref 5–15)
BUN: 12 mg/dL (ref 6–20)
CALCIUM: 8.5 mg/dL — AB (ref 8.9–10.3)
CHLORIDE: 103 mmol/L (ref 101–111)
CO2: 29 mmol/L (ref 22–32)
Creatinine, Ser: 0.65 mg/dL (ref 0.61–1.24)
GLUCOSE: 108 mg/dL — AB (ref 65–99)
POTASSIUM: 3 mmol/L — AB (ref 3.5–5.1)
Sodium: 140 mmol/L (ref 135–145)

## 2016-12-08 LAB — CULTURE, GROUP A STREP (THRC)

## 2016-12-08 LAB — CULTURE, RESPIRATORY: CULTURE: NORMAL

## 2016-12-08 LAB — CULTURE, RESPIRATORY W GRAM STAIN

## 2016-12-08 MED ORDER — POTASSIUM CHLORIDE 20 MEQ PO PACK: 40.0000 meq | PACK | Freq: Once | ORAL | Status: DC

## 2016-12-08 MED ORDER — POTASSIUM CHLORIDE 10 MEQ/100ML IV SOLN
INTRAVENOUS | Status: AC
  Administered 2016-12-08: 10 meq via INTRAVENOUS
  Filled 2016-12-08: qty 100

## 2016-12-08 MED ORDER — POTASSIUM CHLORIDE 20 MEQ/15ML (10%) PO SOLN
40.0000 meq | Freq: Once | ORAL | Status: AC
  Administered 2016-12-08: 40 meq
  Filled 2016-12-08: qty 30

## 2016-12-08 MED ORDER — POTASSIUM CHLORIDE 10 MEQ/100ML IV SOLN
10.0000 meq | INTRAVENOUS | Status: AC
  Administered 2016-12-08 (×5): 10 meq via INTRAVENOUS
  Filled 2016-12-08 (×3): qty 100

## 2016-12-08 NOTE — Progress Notes (Signed)
PT Cancellation Note  Patient Details Name: Antonio [name] MRN: [id number] DOB: 1956-02-07   Cancelled Treatment:    Reason Eval/Treat Not Completed: Fatigue/lethargy limiting ability to participate (pt was up in chair for several hours, just returned to bed and wants to rest. Will attempt later today.)   Philomena Doheny 12/08/2016, 11:53 AM  [phone]

## 2016-12-08 NOTE — Progress Notes (Signed)
Pharmacy Antibiotic Note  Antonio [name] is a 61 y.o. male with hx supraglottic cancer admitted on 11/19/2016 with possible pneumonia.  Pharmacy has been consulted for vancomycin and cefepime dosing.  Has already received vancomycin 1g at 21:28 and Zosyn 3.375g at 19:21 in ED yesterday. Vancomycin and cefepime adjusted to ceftazolin for MSSA bacteremia related to port-a-cath infection.   Today, 12/08/2016 Day #5 antibiotics  WBC WNL  Afebrile  Wound (site of Port): abundant S. Aureus  Repeat Bcx NGTD  TTE unrevealing for endocarditis, Cards evaluating for TEE  Plan:  Cefazolin 2gm IV q8h  Pharmacy to follow at distance (await final plan)  Height: 5\' 3"  (160 cm) Weight: 129 lb 6.6 oz (58.7 kg) IBW/kg (Calculated) : 56.9  Temp (24hrs), Avg:99.4 F (37.4 C), Min:98.4 F (36.9 C), Max:99.9 F (37.7 C)   Recent Labs Lab 11/19/2016 1837  11/28/2016 2201 12/05/16 0037 12/05/16 0244 12/05/16 1941 12/05/16 2022 12/06/16 0405 12/07/16 0327 12/08/16 0321  WBC 3.7*  --   --   --  2.8*  --   --  2.6* 3.1* 5.2  CREATININE 1.00  --   --   --  0.91 0.78 0.75 0.69 0.66 0.65  LATICACIDVEN  --   < > 1.92* 1.2 1.2 2.1* 1.9  --   --   --   < > = values in this interval not displayed.  Estimated Creatinine Clearance: 79 mL/min (by C-G formula based on SCr of 0.65 mg/dL).    No Known Allergies  Antimicrobials this admission:  10/22 Zosyn x 1 10/22 Vanc >>10/23 10/23 Cefepime >>10/23 10/23 Ancef>>  Dose adjustments this admission:   Microbiology results:  10/22 BCx: s. Aureus. (susc pending) BCID 3/4 MSSA 10/22 Urine strep: neg 10/22 Urine legionella: 10/22 Influenza panel:neg 10/22 Strep Pneumo Ua neg 10/22 MRSA PCR: neg 10/23 rapid strep: neg 10/23 strep cx: pending 10/22 Sputum: sq cells, GNR, GPcocci, GPR 10/24 BCx: NGTD 10/24 "wound" (PAC removal site): abundant S. aureus 10/25 Pleural: (GS = no organisms seen)  Thank you for allowing pharmacy to be a part of this  patient's care.  Doreene Eland, PharmD, BCPS.   Pager: [phone] 12/08/2016 8:23 AM

## 2016-12-08 NOTE — Progress Notes (Signed)
Name: Antonio [name] MRN: [id number] DOB: 18-Aug-1955    ADMISSION DATE:  11/26/2016 CONSULTATION DATE:  12/05/2016  REFERRING MD :  Dr. Bonner Puna  CHIEF COMPLAINT:  Sepsis   HISTORY OF PRESENT ILLNESS: 61 y.o. 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. Status post carboplatin and paclitaxel on 10/5. Completed radiation to the right lung and larynx on 7/5. Known poor prognosis & treatment is strictly palliative in nature. Underlying COPD and dysphagia status post PEG. Presented 10/22 with weight loss, decreased appetite, and multiple falls. Overall generalized pain & throat pain. Picture of sepsis with right sided findings concerning for possible pneumonia. Hypotension with responsiveness to IV fluid bolus. Found to have MSSA bacteremia. ID consulted & patient's antibiotics narrowed to Ancef.   SUBJECTIVE:  Afebrile.  WBC 5.2.  Pt reports he had an episode of SOB this am after an episode of runny nose.  He denies acute complaints (SOB, chest pain).  States he has walked around the room.    VITAL SIGNS: Temp:  [98.4 F (36.9 C)-99.9 F (37.7 C)] 98.5 F (36.9 C) (10/26 0800) Pulse Rate:  [119] 119 (10/25 1800) Resp:  [12-26] 17 (10/26 0800) BP: (112-175)/(48-123) 127/77 (10/26 0800) SpO2:  [93 %-100 %] 100 % (10/26 0800)  PHYSICAL EXAMINATION: General: chronically ill appearing male in NAD, sitting in chair  HEENT: MM pink/moist, no jvd PSY: calm/appropriate Neuro: AAOx4, speech clear, MAE CV: s1s2 rrr, no m/r/g PULM: even/non-labored, lungs bilaterally clear, mildly diminished in RLL, right chest wall dressing c/d/i ZS:WFUX, non-tender, bsx4 active  Extremities: warm/dry, no edema  Skin: no rashes or lesions   Recent Labs Lab 12/06/16 0405 12/07/16 0327 12/08/16 0321  NA 139 140 140  K 4.0 2.9* 3.0*  CL 110 107 103  CO2 23 26 29   BUN 20 15 12   CREATININE  0.69 0.66 0.65  GLUCOSE 110* 113* 108*    Recent Labs Lab 12/06/16 0405 12/07/16 0327 12/08/16 0321  HGB 8.7* 9.6* 9.5*  HCT 24.8* 27.5* 28.0*  WBC 2.6* 3.1* 5.2  PLT 126* 139* 173   Ct Chest Wo Contrast  Result Date: 12/06/2016 CLINICAL DATA:  Acute onset of generalized weakness. Status post fall. Confusion. Current history of supraglottic laryngeal cancer, status post chemotherapy and radiation therapy. EXAM: CT CHEST WITHOUT CONTRAST TECHNIQUE: Multidetector CT imaging of the chest was performed following the standard protocol without IV contrast. COMPARISON:  CT of the chest performed 11/13/2016 FINDINGS: Cardiovascular: Diffuse coronary artery calcifications are seen. The heart remains normal in size. Mild calcification is noted along the aortic arch and proximal great vessels. Mediastinum/Nodes: Enlarged mediastinal and bilateral hilar nodes are again seen, less well characterized without contrast but concerning for metastatic disease. As before, mediastinal nodes measure up to 2.3 cm in short axis. Trace pericardial fluid remains within normal limits. The thyroid gland is unremarkable. No axillary lymphadenopathy is seen. Lungs/Pleura: Small to moderate right and small left pleural effusions are noted. There is partial consolidation of the right lower lobe, concerning for pneumonia. Nodular opacities are noted within both lungs, concerning for metastatic disease. Mild underlying interstitial prominence is noted. No pneumothorax is identified. Upper Abdomen: Vague hypodensities within the hepatic dome measure up to 2.8 cm in size, concerning for metastatic disease. The spleen is unremarkable in appearance. The visualized portions of the gallbladder, pancreas, adrenal glands and kidneys are within normal limits. Nonspecific perinephric stranding is noted bilaterally.  A G-tube is noted ending at the body of the stomach. Scattered calcification is seen along the proximal abdominal aorta and its  branches. Musculoskeletal: No acute osseous abnormalities are identified. Packing material is noted at a soft tissue defect at the right chest wall, status post removal of the patient's right-sided chest port. The visualized musculature is unremarkable in appearance. IMPRESSION: 1. Small to moderate right and small left pleural effusions. Increased partial consolidation of the right lower lung lobe, concerning for pneumonia. 2. Nodular opacities within both lungs, concerning for metastatic disease. 3. Enlarged mediastinal and bilateral hilar nodes remain concerning for metastatic disease. 4. Vague hypodensities at the hepatic dome measuring 2.8 cm in size, concerning for metastatic disease. 5. Scattered aortic atherosclerosis. 6. Diffuse coronary artery calcifications. 7. Packing material at a soft tissue defect at the right chest wall, status post removal of right-sided chest port. Electronically Signed   By: Garald Balding M.D.   On: 12/06/2016 22:48   Ir Removal [employer] W/ [place] W/o Fl Mod Sed  Result Date: 12/06/2016 CLINICAL DATA:  History of laryngeal carcinoma and status post right jugular Port-A-Cath placement on 06/23/2016. No admitted with staph aureus bacteremia and requiring removal of the Port-A-Cath. EXAM: REMOVAL OF IMPLANTED TUNNELED PORT-A-CATH MEDICATIONS: Moderate (conscious) sedation was employed during this procedure. A total of Versed 2.0 mg and Fentanyl 100 mcg was administered intravenously. Moderate Sedation Time: 25 minutes. The patient's level of consciousness and vital signs were monitored continuously by radiology nursing throughout the procedure under my direct supervision. PROCEDURE: A time-out was performed prior to initiating the procedure. The right chest Port-A-Cath site was prepped with chlorhexidine. A sterile gown and gloves were worn during the procedure. Local anesthesia was provided with 1% lidocaine. An incision was made overlying the Port-A-Cath with a #15 scalpel.  Utilizing sharp and blunt dissection, the Port-A-Cath was removed. Fluid was aspirated from the port pocket via a dilator and sent for culture analysis. Portable cautery was utilized. Retention sutures were removed. The pocket was irrigated with sterile saline and debrided with wet gauze. The pocket was then packed with iodoform gauze. FINDINGS: With initial incision, there was return of purulent and bloody fluid from the port pocket. Approximately 5 mL was able to be aspirated and sent for culture analysis. The port was removed in its entirety. After vigorous debridement and irrigation of the pocket, the pocket was packed with iodoform gauze. This will be left in for 24 hours and removed. Wet-to-dry dressings will then be performed to let the pocket heal by secondary intention. IMPRESSION: Removal of implanted Port-A-Cath utilizing sharp and blunt dissection. There is evidence of port pocket infection with bloody and purulent fluid aspirated from the pocket. A sample was sent for culture analysis. The pocket was packed with iodoform gauze after port removal and will be allowed to heal secondarily. Electronically Signed   By: Aletta Edouard M.D.   On: 12/06/2016 13:48   Dg Chest Port 1 View  Result Date: 12/07/2016 CLINICAL DATA:  Status post right-sided thoracentesis EXAM: PORTABLE CHEST 1 VIEW COMPARISON:  Chest radiograph and chest CT December 06, 2016 FINDINGS: Right pleural effusion is smaller following thoracentesis. No pneumothorax. There is a small pleural effusion on each side currently. There is atelectatic change in the lung bases. Heart is upper normal in size with pulmonary vascularity within normal limits. There is aortic atherosclerosis. No adenopathy. No evident bone lesions. IMPRESSION: No pneumothorax. Small pleural effusions bilaterally with bibasilar atelectasis. Heart is upper normal in  size. No adenopathy evident. There is aortic atherosclerosis. Aortic Atherosclerosis (ICD10-I70.0).  Electronically Signed   By: Lowella Grip III M.D.   On: 12/07/2016 10:54   STUDIES:  CERVICAL LYMPH NODE BIOPSY 05/18/16:  Squamous cell carcinoma  PET CT 06/08/16: IMPRESSION: 1. 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 2. Hypermetabolic right lower lobe pulmonary nodule is favored to represent a synchronous primary bronchogenic neoplasm. Isolated pulmonary metastasis could look similar. No thoracic nodal hypermetabolism. 3. Soft tissue hypermetabolism about the right iliac bone and greater trochanter of the proximal right femur are favored to be degenerative or posttraumatic. Soft tissue metastasis felt highly unlikely. 4.  Coronary artery atherosclerosis. Aortic atherosclerosis. 5. A tiny right lower lobe pulmonary nodule is most likely a subpleural lymph node. CT CHEST W/ CONTRAST 10/1: IMPRESSION: 1. Interim development of multiple enlarged mediastinal and hilar lymph nodes consistent with metastatic disease. 2. Development of multiple bilateral pulmonary nodules, also suspicious for metastatic disease. Suspect that the previously noted right lower lobe lung mass has increased in size, now measuring about 26 mm compared with 19 mm previously. 3. New small right-sided pleural effusion. Patchy consolidations in the right lower lobe, uncertain if this represents pneumonia, aspiration, or metastatic nodules. 4. Subcentimeter hypodense foci in the liver, too small to further characterize. When the patient is clinically stable and able to follow directions and hold their breath (preferably as an outpatient) further evaluation with dedicated abdominal MRI may be considered. PORT CXR 10/24:  Personally reviewed by me. Persistent right basilar opacification with silhouetting of hemidiaphragm & fullness to right hilum. Streaky opacification with some suggestion of air bronchogram and consolidation within the left lower lung as well.  Unable to appreciate pleural effusion meniscus. Right-sided port in place.  MICROBIOLOGY:  Blood Cultures x2 10/22 >> MSSA Sputum Culture 10/22 >>> Group A Strep Culture 10/23 >>> MRSA PCR 10/23:  Negative Blood Culture x1 10/24 >>> R Chest Wound 10/24 >> abundant staph >>  R Pleural Fluid 10/25 >> WBC 1,234, 97% neutrophil, LD 388, total protein <3.   R Pleural Culture 10/25 >>  R Pleural Cytology 10/25 >>   ANTIBIOTICS: Zosyn 10/22 (x1 dose) Vancomycin 10/22 (x1 dose) Cefepime 10/23 (x2 doses) Ancef 10/23 >>>  SIGNIFICANT EVENTS  10/22 - Admitted 10/23 - Transferred to SDU w/ repeat episode hypotension 10/25 - Thoracentesis, R side with 1.5 L bloody/cloudy fluid removed   ASSESSMENT / PLAN:  61 y.o. male with stage IV metastatic squamous cell carcinoma. Previous imaging indicated evolving metastatic disease of the lungs. Patient admitted with sepsis secondary to MSSA bacteremia. Imaging on x-ray could represent either a right-sided pneumonia and possibly evolving left-sided pneumonia or simply progression of underlying pulmonary metastasis and development of malignant or paramalignant pleural effusion.  Sepsis - secondary to MSSA bacteremia P: Continue Ancef  Follow cultures  ID following   Hypotension - resolved with IVF P: SDU monitoring   Exudative Right Sided Pleural Effusion - s/p thora on 10/25, Protein ratio 0.6, LDH ratio 2 P: Await cytology, cultures Monitor CXR for re-accumulation   RLL Consolidation - PNA vs compressive atelectasis  P: Follow intermittent CXR  Await cultures  Pulmonary hygiene - IS, mobilize as able    PCCM will follow up on Monday 10/29 or sooner if cytology / cultures returned. Please call sooner if new needs arise.   Noe Gens, NP-C [facility] Pgr: [phone] or if no answer [phone] 12/08/2016, 9:54 AM

## 2016-12-08 NOTE — Progress Notes (Signed)
Nutrition Follow-up  DOCUMENTATION CODES:   Severe malnutrition in context of chronic illness  INTERVENTION:  - Continue current TF regimen. - PO intakes for comfort, as tolerated.   NUTRITION DIAGNOSIS:   Malnutrition (Severe) related to chronic illness, cancer and cancer related treatments as evidenced by percent weight loss, severe depletion of muscle mass, severe depletion of body fat. -ongoing  GOAL:   Patient will meet greater than or equal to 90% of their needs -met with TF regimen.   MONITOR:   TF tolerance, Diet advancement, Weight trends, Labs, Skin  ASSESSMENT:   Pt with PMH significant for supraglottic cancer s/p radiation/chemotherapy/resection, dysphagia s/p PEG, COPD, tobacco abuse, and HTN. Recently sent over from [facility] 10/2 for admission with concerning hypercalcemia. Presents this admission with sepsis PNA and anemia associated with chemotherapy.   10/26 Pt remains NPO at this time but Dr. Arman Filter note from this AM states "ok to give liquid PO for pt comfort." Pt with PEG and continues with 6 cartons Osmolite 1.5 per day with 100 mL free water every 4 hours (x6/day). This regimen provides 2130 kcal, 89 grams of protein, and 1686 mL free water. Weight yesterday was +9 lbs/4 kg from 10/23. He had thoracentesis yesterday with 1.5L removed. Will continue to monitor weight trends closely. In rounds this AM, RN reported that pt had some issues with reflux yesterday but that pt has done well with TF this AM, no issues with TF via PEG.  Medications reviewed; 10 mEq IV KCl x5 runs today, 40 mEq KCl per PEG x1 dose today, 1 tablet Senokot BID.  Labs reviewed; CBG: 84 mg/dL, K: 3 mmol/L, Ca: 8.5 mg/dL.    10/23 - Spoke with pt at bedside.  - Reports being compliant with tube feeding at home.  - States he follows his current regimen of 2 bottles Osmolite 1.5 TID with 60 ml flushes before and after each feeding.  - Pt consuming full liquid products but unable to  clarify which items.   - Records indicate pt has lost 5.5% of body weight in the last month, totaling 15.4% of body weight in the last 2 months. This is significant.  - Unsure if patient has been compliant with tube feedings, as current regimen needs are to promote weight gain.  - Will continue with current regimen this hospital stay.  - Pt was seen 10/16 by [facility] notes that patient has no trouble getting tube feeding supplies or refills.   Nutrition-Focused physical exam completed. Findings are severe fat depletion, severe muscle depletion, and no edema.       Diet Order:  Diet NPO time specified  EDUCATION NEEDS:   No education needs identified at this time  Skin:  Skin Assessment: Skin Integrity Issues: Skin Integrity Issues:: Other (Comment) (R chest puncture 10/24 from port removal)  Last BM:  10/23  Height:   Ht Readings from Last 1 Encounters:  12/05/16 5' 3" (1.6 m)    Weight:   Wt Readings from Last 1 Encounters:  12/07/16 129 lb 6.6 oz (58.7 kg)    Ideal Body Weight:  56.4 kg  BMI:  Body mass index is 22.92 kg/m.  Estimated Nutritional Needs:   Kcal:  1910-2182 kcal (35-39 kcal/kg)  Protein:  80-90 grams (1.4-1.6 g/kg)  Fluid:  2.2 L fluid     Jarome Matin, MS, RD, LDN, [facility] Inpatient Clinical Dietitian Pager # [phone] After hours/weekend pager # [phone]

## 2016-12-08 NOTE — Progress Notes (Signed)
PROGRESS NOTE  Antonio [name]  PZW:[id number] DOB: 1955/10/02 DOA: 12/01/2016 PCP: Maryellen Pile, MD  Outpatient Specialists: Oncology, [name] Brief Narrative: Antonio [name] is a 61 y.o. male with a history of supraglottic laryngeal CA s/p resection, chemo/radiation currently on palliative chemotherapy who was brought from home by EMS due to weakness, a fall, and confusion which was reported by his sister. He reports he slipped and fell on urine and denies any confusion. He reported chills and a productive cough to admitting MD. He denied fevers but had a temperature of 103.64F, heart rates 134-141, respirations 18-32, blood pressures 117/57-122/66, O2 saturations 91-96% on room air. Labs revealed WBC 3.7, hemoglobin 8.2, MCV 76.8, RDW 16.4, platelets 184, BUN 23, creatinine 1, albumin 2.5, lactic acid trending upwards to 1.92. Chest x-ray showed right base collapse with small pleural effusion. Urinalysis was negative for any signs of infection. He was given 3L of normal saline, empiric antibiotics and admitted. Blood cultures became positive for MSSA, antibiotics narrowed to ancef per ID recommendations. Pt became hypotensive and was transferred to the stepdown unit with CCM consult, though vitals have stabilized with fluids alone. Port was removed with purulent pocket noted on 10/24 and thoracentesis was performed 10/25.   Assessment & Plan: Principal Problem:   Sepsis due to pneumonia 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 Port removed 10/24. Hepatitis, HIV negative.  -ID, Dr. Johnnye Sima, consulted. -Appreciate CCM involvement. Vitals improved.  -Vanc/cefepime > Ancef 10/23.  -Continue IVF -Blood cultures repeated 10/24 -Echocardiogram made no mention of vegetations.  -Consulted cardiology for a TEE, not feasible due to underlying laryngeal cancer, will probably need to do for 6 weeks of treatment, defer to ID final decision regarding antibiotics length  RLL opacity on imaging -CT chest ordered to evaluate but pt unable to undergo this. Thoracentesis performed 10/25 by CCM with diagnostic/therapeutic intent. -Cytology from fluid pending today  Supraglottic laryngeal cancer s/p resection with metastases -Currently on palliative chemotherapy.  -Discussed with [name]. Oncology to remain on board during admission.  -Continue home morphine for cancer-related pain, which is stable.  -Palliative care team consulted  Dysphagia -s/p PEG May 2018. -Continue feeds thru PEG tube. Ok to give liquid po for pt comfort.  Pancytopenia -Suspect due to marrow suppression.  -Anemia improved 7.4 > 8.7 with transfusion 10/23. Continue monitoring.  Hemoglobin stable at 9.5 this morning  Hypokalemia -Resolved with replacement. -Monitor, potassium 3.0 today, supplement again  Fall with generalized weakness -suspect due to ongoing malignancy and chemotherapy worsened acutely by infection. -Physical therapy to evaluate.   History of hypercalcemia of malignancy -Admitted earlier Oct 2018.  -Resolved, will monitor.   Peripheral neuropathy -Continue gabapentin    DVT prophylaxis: SCDs Code Status: Full Family Communication: None at bedside this AM Disposition Plan: continue in SDU today. If vitals stable 10/26, may transfer to telemetry.   Consultants:   Oncology, [name].  ID, Dr. Johnnye Sima  IR, Dr. Anselm Pancoast  CCM,  Dr. Ashok Cordia    Palliative care team  Procedures:   Echocardiogram 12/06/2016: - Left ventricle: The cavity size was normal. Wall thickness was   normal. Systolic function was normal. The estimated ejection   fraction was in the range of 55% to 60%. Wall motion was normal;   there were no regional wall motion abnormalities. Doppler   parameters are consistent with abnormal left ventricular   relaxation (grade 1 diastolic dysfunction). - Mitral valve: Calcified annulus.  Impressions: - Normal LV systolic function; mild diastolic dysfunction.  Antimicrobials:  Vancomycin 10/22 > 10/23  Zosyn 10/22  Cefepime 10/22 > 10/23   Ancef 10/23 >>   Subjective: -No complaints this morning, no chest pain, no shortness of breath.  No lightheadedness or dizziness.  No abdominal pain, nausea or vomiting  Objective: Vitals:   12/08/16 0501 12/08/16 0513 12/08/16 0700 12/08/16 0800  BP: 120/64  133/79 127/77  Pulse:      Resp:  12 20 17   Temp:    98.5 F (36.9 C)  TempSrc:    Oral  SpO2:  93% 99% 100%  Weight:      Height:        Intake/Output Summary (Last 24 hours) at 12/08/16 0906 Last data filed at 12/08/16 0900  Gross per 24 hour  Intake          7395.33 ml  Output             2075 ml  Net          5320.33 ml   Filed Weights   12/05/16 0115 12/06/16 0230 12/07/16 0337  Weight: 54.7 kg (120 lb 9.5 oz) 57.3 kg (126 lb 5.2 oz) 58.7 kg (129 lb 6.6 oz)    Gen: Ill-appearing male in no acute distress Pulm: Overall decreased breath sounds, no wheezing, no crackles. CV: Regular rate and rhythm, no murmurs, tachycardic.  No JVD, no peripheral edema GI: Soft, nontender, nondistended.  Bowel sounds positive. Ext: Decreased muscle mass, warm Skin: Dressing clean at the port site Neuro: Cranial nerves grossly intact, strength equal 5 out of 5 in all 4 extremities Psych: Normal mood  Data Reviewed: I have personally reviewed following labs and imaging studies  CBC:  Recent Labs Lab  11/21/2016 1837 12/05/16 0244 12/06/16 0405 12/07/16 0327 12/08/16 0321  WBC 3.7* 2.8* 2.6* 3.1* 5.2  NEUTROABS 3.2  --  2.1  --   --   HGB 8.2* 7.4* 8.7* 9.6* 9.5*  HCT 23.5* 21.6* 24.8* 27.5* 28.0*  MCV 76.8* 77.7* 78.0 78.1 78.9  PLT 184 139* 126* 139* 353   Basic Metabolic Panel:  Recent Labs Lab 12/05/16 1941 12/05/16 2022 12/06/16 0405 12/07/16 0327 12/08/16 0321  NA 138 137 139 140 140  K 2.9* 2.8* 4.0 2.9* 3.0*  CL 104 105 110 107 103  CO2 23 23 23 26 29   GLUCOSE 157* 168* 110* 113* 108*  BUN 21* 21* 20 15 12   CREATININE 0.78 0.75 0.69 0.66 0.65  CALCIUM 7.4* 7.1* 7.6* 8.0* 8.5*  MG 2.0 2.0  --   --   --   PHOS 2.4* 2.5  --   --   --    GFR: Estimated Creatinine Clearance: 79 mL/min (by C-G formula based on SCr of 0.65 mg/dL). Liver Function Tests:  Recent Labs Lab 11/28/2016 1837 12/06/16 0405 12/07/16 1104  AST 38 53*  --   ALT 57 52  --   ALKPHOS 91 90  --   BILITOT 1.3*  0.9  --   PROT 6.3* 5.2* 5.1*  ALBUMIN 2.5* 2.0*  --    No results for input(s): LIPASE, AMYLASE in the last 168 hours. No results for input(s): AMMONIA in the last 168 hours. Coagulation Profile:  Recent Labs Lab 12/10/2016 1837  INR 1.29   Cardiac Enzymes:  Recent Labs Lab 11/19/2016 1924 12/05/16 0037 12/05/16 0244  TROPONINI 0.03* 0.03* <0.03   BNP (last 3 results) No results for input(s): PROBNP in the last 8760 hours. HbA1C: No results for input(s): HGBA1C in the last 72 hours. CBG:  Recent Labs Lab 12/07/16 0743 12/07/16 1153 12/07/16 1620 12/07/16 2127 12/08/16 0729  GLUCAP 81 146* 151* 171* 84   Lipid Profile: No results for input(s): CHOL, HDL, LDLCALC, TRIG, CHOLHDL, LDLDIRECT in the last 72 hours. Thyroid Function Tests: No results for input(s): TSH, T4TOTAL, FREET4, T3FREE, THYROIDAB in the last 72 hours. Anemia Panel: No results for input(s): VITAMINB12, FOLATE, FERRITIN, TIBC, IRON, RETICCTPCT in the last 72 hours. Urine analysis:    Component  Value Date/Time   COLORURINE YELLOW 11/22/2016 1847   APPEARANCEUR CLEAR 11/15/2016 1847   LABSPEC 1.013 12/13/2016 1847   PHURINE 5.0 12/01/2016 1847   GLUCOSEU NEGATIVE 12/08/2016 1847   HGBUR NEGATIVE 11/22/2016 1847   BILIRUBINUR NEGATIVE 11/20/2016 1847   KETONESUR NEGATIVE 11/26/2016 1847   PROTEINUR NEGATIVE 11/26/2016 1847   NITRITE NEGATIVE 12/12/2016 1847   LEUKOCYTESUR NEGATIVE 12/06/2016 1847   Recent Results (from the past 240 hour(s))  Culture, blood (Routine x 2)     Status: Abnormal   Collection Time: 12/05/2016  6:37 PM  Result Value Ref Range Status   Specimen Description BLOOD RIGHT ANTECUBITAL  Final   Special Requests   Final    BOTTLES DRAWN AEROBIC AND ANAEROBIC Blood Culture adequate volume   Culture  Setup Time   Final    GRAM POSITIVE COCCI IN BOTH AEROBIC AND ANAEROBIC BOTTLES CRITICAL RESULT CALLED TO, READ BACK BY AND VERIFIED WITH: M. Lear Ng Pharm.D. 10:55 12/05/16 ([email or web address]) Performed at [facility], [place] [address], [place], [place] [address]    Culture STAPHYLOCOCCUS AUREUS (A)  Final   Report Status 12/07/2016 FINAL  Final   Organism ID, Bacteria STAPHYLOCOCCUS AUREUS  Final      Susceptibility   Staphylococcus aureus - MIC*    CIPROFLOXACIN <=0.5 SENSITIVE Sensitive     ERYTHROMYCIN <=0.25 SENSITIVE Sensitive     GENTAMICIN <=0.5 SENSITIVE Sensitive     OXACILLIN 0.5 SENSITIVE Sensitive     TETRACYCLINE <=1 SENSITIVE Sensitive     VANCOMYCIN 1 SENSITIVE Sensitive     TRIMETH/SULFA <=10 SENSITIVE Sensitive     CLINDAMYCIN <=0.25 SENSITIVE Sensitive     RIFAMPIN <=0.5 SENSITIVE Sensitive     Inducible Clindamycin NEGATIVE Sensitive     * STAPHYLOCOCCUS AUREUS  Blood Culture ID Panel (Reflexed)     Status: Abnormal   Collection Time: 11/30/2016  6:37 PM  Result Value Ref Range Status   Enterococcus species NOT DETECTED NOT DETECTED Final   Listeria monocytogenes NOT DETECTED NOT DETECTED Final   Staphylococcus species DETECTED (A) NOT  DETECTED Final    Comment: CRITICAL RESULT CALLED TO, READ BACK BY AND VERIFIED WITH: M. Chanetta Marshall.D. 10:55 12/05/16 ([email or web address])    Staphylococcus aureus DETECTED (A) NOT DETECTED Final    Comment: Methicillin (oxacillin) susceptible Staphylococcus aureus (MSSA). Preferred therapy is anti staphylococcal beta lactam antibiotic (Cefazolin or Nafcillin), unless clinically contraindicated. CRITICAL RESULT CALLED TO, READ BACK BY AND VERIFIED  WITH: M. Lear Ng Pharm.D. 10:55 12/05/16 ([email or web address])    Methicillin resistance NOT DETECTED NOT DETECTED Final   Streptococcus species NOT DETECTED NOT DETECTED Final   Streptococcus agalactiae NOT DETECTED NOT DETECTED Final   Streptococcus pneumoniae NOT DETECTED NOT DETECTED Final   Streptococcus pyogenes NOT DETECTED NOT DETECTED Final   Acinetobacter baumannii NOT DETECTED NOT DETECTED Final   Enterobacteriaceae species NOT DETECTED NOT DETECTED Final   Enterobacter cloacae complex NOT DETECTED NOT DETECTED Final   Escherichia coli NOT DETECTED NOT DETECTED Final   Klebsiella oxytoca NOT DETECTED NOT DETECTED Final   Klebsiella pneumoniae NOT DETECTED NOT DETECTED Final   Proteus species NOT DETECTED NOT DETECTED Final   Serratia marcescens NOT DETECTED NOT DETECTED Final   Haemophilus influenzae NOT DETECTED NOT DETECTED Final   Neisseria meningitidis NOT DETECTED NOT DETECTED Final   Pseudomonas aeruginosa NOT DETECTED NOT DETECTED Final   Candida albicans NOT DETECTED NOT DETECTED Final   Candida glabrata NOT DETECTED NOT DETECTED Final   Candida krusei NOT DETECTED NOT DETECTED Final   Candida parapsilosis NOT DETECTED NOT DETECTED Final   Candida tropicalis NOT DETECTED NOT DETECTED Final    Comment: Performed at [facility], [address], [place], [place] [address]  Culture, blood (Routine x 2)     Status: Abnormal   Collection Time: 12/03/2016  6:40 PM  Result Value Ref Range Status   Specimen Description BLOOD RIGHT FOREARM  Final    Special Requests   Final    BOTTLES DRAWN AEROBIC AND ANAEROBIC Blood Culture adequate volume   Culture  Setup Time   Final    GRAM POSITIVE COCCI IN BOTH AEROBIC AND ANAEROBIC BOTTLES CRITICAL VALUE NOTED.  VALUE IS CONSISTENT WITH PREVIOUSLY REPORTED AND CALLED VALUE.    Culture (A)  Final    STAPHYLOCOCCUS AUREUS SUSCEPTIBILITIES PERFORMED ON PREVIOUS CULTURE WITHIN THE LAST 5 DAYS. Performed at [facility], [place] [address], [place], [place] [address]    Report Status 12/07/2016 FINAL  Final  Culture, sputum-assessment     Status: None   Collection Time: 11/14/2016 11:40 PM  Result Value Ref Range Status   Specimen Description SPUTUM  Final   Special Requests Immunocompromised  Final   Sputum evaluation THIS SPECIMEN IS ACCEPTABLE FOR SPUTUM CULTURE  Final   Report Status 12/05/2016 FINAL  Final  Culture, respiratory (NON-Expectorated)     Status: None (Preliminary result)   Collection Time: 11/28/2016 11:40 PM  Result Value Ref Range Status   Specimen Description SPUTUM  Final   Special Requests Immunocompromised Reflexed from [id number]  Final   Gram Stain   Final    RARE WBC PRESENT,BOTH PMN AND MONONUCLEAR ABUNDANT SQUAMOUS EPITHELIAL CELLS PRESENT MODERATE GRAM POSITIVE COCCI MODERATE GRAM POSITIVE RODS MODERATE GRAM NEGATIVE RODS    Culture   Final    CULTURE REINCUBATED FOR BETTER GROWTH Performed at [facility], [place] [address] [place], [place] [address]    Report Status PENDING  Incomplete  Rapid strep screen     Status: None   Collection Time: 12/05/16  4:04 PM  Result Value Ref Range Status   Streptococcus, Group A Screen (Direct) NEGATIVE NEGATIVE Final    Comment: (NOTE) A Rapid Antigen test may result negative if the antigen level in the sample is below the detection level of this test. The FDA has not cleared this test as a stand-alone test therefore the rapid antigen negative result has reflexed to a Group A  Strep culture.   Culture, group A  strep     Status: None (Preliminary result)   Collection Time: 12/05/16  4:04 PM  Result Value Ref Range Status   Specimen Description THROAT  Final   Special Requests NONE Reflexed from [id number]  Final   Culture   Final    CULTURE REINCUBATED FOR BETTER GROWTH Performed at [facility], [address], [place], [place] [address]    Report Status PENDING  Incomplete  MRSA PCR Screening     Status: None   Collection Time: 12/05/16  8:20 PM  Result Value Ref Range Status   MRSA by PCR NEGATIVE NEGATIVE Final    Comment:        The GeneXpert MRSA Assay (FDA approved for NASAL specimens only), is one component of a comprehensive MRSA colonization surveillance program. It is not intended to diagnose MRSA infection nor to guide or monitor treatment for MRSA infections.   Culture, blood (Routine X 2) w Reflex to ID Panel     Status: None (Preliminary result)   Collection Time: 12/06/16  4:05 AM  Result Value Ref Range Status   Specimen Description BLOOD LEFT HAND  Final   Special Requests   Final    BOTTLES DRAWN AEROBIC AND ANAEROBIC Blood Culture adequate volume   Culture   Final    NO GROWTH 1 DAY Performed at [facility], [place] [address], [place], [place] [address]    Report Status PENDING  Incomplete  Aerobic/Anaerobic Culture (surgical/deep wound)     Status: None (Preliminary result)   Collection Time: 12/06/16 12:41 PM  Result Value Ref Range Status   Specimen Description WOUND RIGHT CHEST  Final   Special Requests Normal  Final   Gram Stain   Final    ABUNDANT WBC PRESENT, PREDOMINANTLY PMN ABUNDANT GRAM POSITIVE COCCI IN PAIRS    Culture   Final    ABUNDANT STAPHYLOCOCCUS AUREUS SUSCEPTIBILITIES TO FOLLOW Performed at [facility], [place] [address] [place], [place] [address]    Report Status PENDING  Incomplete  Body fluid culture     Status: None (Preliminary result)   Collection Time: 12/07/16 10:31 AM  Result Value Ref Range Status   Specimen  Description PLEURAL RIGHT  Final   Special Requests Immunocompromised  Final   Gram Stain   Final    ABUNDANT WBC PRESENT,BOTH PMN AND MONONUCLEAR NO ORGANISMS SEEN Performed at [facility], [address] [place], [place] [address]    Culture PENDING  Incomplete   Report Status PENDING  Incomplete      Radiology Studies: Ct Chest Wo Contrast  Result Date: 12/06/2016 CLINICAL DATA:  Acute onset of generalized weakness. Status post fall. Confusion. Current history of supraglottic laryngeal cancer, status post chemotherapy and radiation therapy. EXAM: CT CHEST WITHOUT CONTRAST TECHNIQUE: Multidetector CT imaging of the chest was performed following the standard protocol without IV contrast. COMPARISON:  CT of the chest performed 11/13/2016 FINDINGS: Cardiovascular: Diffuse coronary artery calcifications are seen. The heart remains normal in size. Mild calcification is noted along the aortic arch and proximal great vessels. Mediastinum/Nodes: Enlarged mediastinal and bilateral hilar nodes are again seen, less well characterized without contrast but concerning for metastatic disease. As before, mediastinal nodes measure up to 2.3 cm in short axis. Trace pericardial fluid remains within normal limits. The thyroid gland is unremarkable. No axillary lymphadenopathy is seen. Lungs/Pleura: Small to moderate right and small left pleural effusions are  noted. There is partial consolidation of the right lower lobe, concerning for pneumonia. Nodular opacities are noted within both lungs, concerning for metastatic disease. Mild underlying interstitial prominence is noted. No pneumothorax is identified. Upper Abdomen: Vague hypodensities within the hepatic dome measure up to 2.8 cm in size, concerning for metastatic disease. The spleen is unremarkable in appearance. The visualized portions of the gallbladder, pancreas, adrenal glands and kidneys are within normal limits. Nonspecific perinephric stranding is  noted bilaterally. A G-tube is noted ending at the body of the stomach. Scattered calcification is seen along the proximal abdominal aorta and its branches. Musculoskeletal: No acute osseous abnormalities are identified. Packing material is noted at a soft tissue defect at the right chest wall, status post removal of the patient's right-sided chest port. The visualized musculature is unremarkable in appearance. IMPRESSION: 1. Small to moderate right and small left pleural effusions. Increased partial consolidation of the right lower lung lobe, concerning for pneumonia. 2. Nodular opacities within both lungs, concerning for metastatic disease. 3. Enlarged mediastinal and bilateral hilar nodes remain concerning for metastatic disease. 4. Vague hypodensities at the hepatic dome measuring 2.8 cm in size, concerning for metastatic disease. 5. Scattered aortic atherosclerosis. 6. Diffuse coronary artery calcifications. 7. Packing material at a soft tissue defect at the right chest wall, status post removal of right-sided chest port. Electronically Signed   By: Garald Balding M.D.   On: 12/06/2016 22:48   Ir Removal [employer] W/ [place] W/o Fl Mod Sed  Result Date: 12/06/2016 CLINICAL DATA:  History of laryngeal carcinoma and status post right jugular Port-A-Cath placement on 06/23/2016. No admitted with staph aureus bacteremia and requiring removal of the Port-A-Cath. EXAM: REMOVAL OF IMPLANTED TUNNELED PORT-A-CATH MEDICATIONS: Moderate (conscious) sedation was employed during this procedure. A total of Versed 2.0 mg and Fentanyl 100 mcg was administered intravenously. Moderate Sedation Time: 25 minutes. The patient's level of consciousness and vital signs were monitored continuously by radiology nursing throughout the procedure under my direct supervision. PROCEDURE: A time-out was performed prior to initiating the procedure. The right chest Port-A-Cath site was prepped with chlorhexidine. A sterile gown and gloves  were worn during the procedure. Local anesthesia was provided with 1% lidocaine. An incision was made overlying the Port-A-Cath with a #15 scalpel. Utilizing sharp and blunt dissection, the Port-A-Cath was removed. Fluid was aspirated from the port pocket via a dilator and sent for culture analysis. Portable cautery was utilized. Retention sutures were removed. The pocket was irrigated with sterile saline and debrided with wet gauze. The pocket was then packed with iodoform gauze. FINDINGS: With initial incision, there was return of purulent and bloody fluid from the port pocket. Approximately 5 mL was able to be aspirated and sent for culture analysis. The port was removed in its entirety. After vigorous debridement and irrigation of the pocket, the pocket was packed with iodoform gauze. This will be left in for 24 hours and removed. Wet-to-dry dressings will then be performed to let the pocket heal by secondary intention. IMPRESSION: Removal of implanted Port-A-Cath utilizing sharp and blunt dissection. There is evidence of port pocket infection with bloody and purulent fluid aspirated from the pocket. A sample was sent for culture analysis. The pocket was packed with iodoform gauze after port removal and will be allowed to heal secondarily. Electronically Signed   By: Aletta Edouard M.D.   On: 12/06/2016 13:48   Dg Chest Port 1 View  Result Date: 12/07/2016 CLINICAL DATA:  Status post right-sided thoracentesis  EXAM: PORTABLE CHEST 1 VIEW COMPARISON:  Chest radiograph and chest CT December 06, 2016 FINDINGS: Right pleural effusion is smaller following thoracentesis. No pneumothorax. There is a small pleural effusion on each side currently. There is atelectatic change in the lung bases. Heart is upper normal in size with pulmonary vascularity within normal limits. There is aortic atherosclerosis. No adenopathy. No evident bone lesions. IMPRESSION: No pneumothorax. Small pleural effusions bilaterally with  bibasilar atelectasis. Heart is upper normal in size. No adenopathy evident. There is aortic atherosclerosis. Aortic Atherosclerosis (ICD10-I70.0). Electronically Signed   By: Lowella Grip III M.D.   On: 12/07/2016 10:54    Scheduled Meds: . chlorhexidine  15 mL Mouth Rinse BID  . enoxaparin (LOVENOX) injection  40 mg Subcutaneous Q24H  . feeding supplement (OSMOLITE 1.5 CAL)  237 mL Per Tube 6 X Daily  . free water  100 mL Per Tube Q4H  . gabapentin  300 mg Per Tube TID  . mouth rinse  15 mL Mouth Rinse q12n4p  . senna-docusate  1 tablet Oral BID   Continuous Infusions: . sodium chloride    .  ceFAZolin (ANCEF) IV Stopped (12/08/16 0153)  . potassium chloride 10 mEq (12/08/16 0830)     LOS: 4 days   Costin M. Cruzita Lederer, MD [facility] [phone]  If 7PM-7AM, please contact night-coverage [email or web address] Password [email or web address]   12/08/2016, 9:06 AM

## 2016-12-08 NOTE — Consult Note (Signed)
Consultation Note Date: 12/08/2016   Patient Name: Antonio [name]  DOB: [birth year]  MRN: [id number]  Age / Sex: 61 y.o., male  PCP: Maryellen Pile, MD Referring Physician: Caren Griffins, MD  Reason for Consultation: Establishing goals of care  HPI/Patient Profile: 61 y.o. male  with past medical history of COPD, dysphasia status post PEG, stage IV supraglottic cancer admitted on 11/30/2016 with Sepsis and concern for pneumonia with subsequent discovery of MSSA bacteremia with infected port.  Palliative consult for goals of care.  Clinical Assessment and Goals of Care: I met today with Antonio [name]. We discussed clinical course as well as wishes moving forward in regard to advanced directives.  Values and goals of care important to patient and family were attempted to be elicited.  He reports he is tired not in the mood to talk today.  We did review conversation he had last encounter with Dr. Rowe Pavy from our team where he noted that he wanted his sister, Antonio [name], to serve as his healthcare power of attorney.  Otherwise, he states he is not wanting to make any further decisions about his long-term care plan until he has results back from cytology and discusses again with [name].  Questions and concerns addressed.   PMT will continue to support holistically.  SUMMARY OF RECOMMENDATIONS   -[name] is clear that he wants to await results of cytology and to discuss case with [name] prior to making any other changes to a long-term care plan. -He states that he would like for his sister, Antonio [name], to service his surrogate decision maker.  This was also discussed on the last admission but I do not see where HCPOA was completed as he was discharged shortly after this conversation.  I will place a consult to chaplain services to follow-up with him.  Code Status/Advance Care Planning:  Full  code   Symptom Management:   Reports good symptom management this time.  Recommend to continue same  Palliative Prophylaxis:   Bowel Regimen  Additional Recommendations (Limitations, Scope, Preferences):  Full Scope Treatment  Psycho-social/Spiritual:   Desire for further Chaplaincy support:yes  Additional Recommendations: Caregiving  Support/Resources  Prognosis:   Unable to determine  Discharge Planning: To Be Determined      Primary Diagnoses: Present on Admission: . Sepsis due to pneumonia ([place]) . Anemia associated with chemotherapy . 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[place]) . Anemia, chronic disease . Malignant neoplasm of lower lobe of right lung ([place]) . 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 Pancytopenia, acquired ([place]) . Severe protein-calorie malnutrition ([place]) . MSSA bacteremia . Bloodstream infection due to Port-A-Cath   I have reviewed the medical record, interviewed the patient and family, and examined the patient. The following aspects are pertinent.  Past Medical History:  Diagnosis Date  . Allergy    allergic rhinitis  . Arthritis    "qwhere" (05/17/2016)  . Chronic cough   . Chronic sinus infection   . Colonic polyp   . COPD (chronic obstructive pulmonary disease) ([place])   . 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 Dysphagia    normal EGD except for mild gastritis in 01/2006  . GERD (gastroesophageal reflux disease)   . Head and neck malignancy ([place]) 05/18/2016  . High cholesterol   . History of radiation therapy 06/28/16- 08/17/16   Larynx 70 Gy in 35 fractions to gross disease, 63 Gy in 35 fractions to high risk nodal echelons, and 56 Gy in  35 fractions to intermediate risk nodal echelons  . History of radiation therapy 06/28/16- 08/17/16   Right Lung 60 Gy in 5 fractions--- given at same time as Larynx radiation  . Hypertension   . Rectal bleeding    colonoscopy in 01/2006  . Rectal polyp    01/2006 with path showing it  to be benign, though with a question that it was incomplete biopsy, next scheduled coloscopy 2010  . Sickle cell trait ([place])   . Tobacco abuse   . Varus deformity of knee    bilateral, congenital   Social History   Social History  . Marital status: Single    Spouse name: N/A  . Number of children: N/A  . Years of education: N/A   Social History Main Topics  . Smoking status: Former Smoker    Packs/day: 0.12    Years: 43.00    Types: Cigarettes  . Smokeless tobacco: Never Used     Comment: cuting back  . Alcohol use 14.4 oz/week    24 Cans of beer per week     Comment: he tells me he has decreased. He is drinking 6-8 cans of beer a week.   . Drug use: Yes    Types: "Crack" cocaine, Marijuana     Comment: 05/17/2016 "quit marijuana in the 1970s til recently; started smoking to kill pain in my legs/knees; use crack very rarely"  . Sexual activity: Not Currently   Other Topics Concern  . None   Social History Narrative   Currently awaiting disability.   Cell# [phone]   Family History  Problem Relation Age of Onset  . Cancer Mother        gastric ca  . Hypertension Father   . Diabetes Father   . Diabetes Sister   . CAD Sister    Scheduled Meds: . chlorhexidine  15 mL Mouth Rinse BID  . enoxaparin (LOVENOX) injection  40 mg Subcutaneous Q24H  . feeding supplement (OSMOLITE 1.5 CAL)  237 mL Per Tube 6 X Daily  . free water  100 mL Per Tube Q4H  . gabapentin  300 mg Per Tube TID  . mouth rinse  15 mL Mouth Rinse q12n4p  . senna-docusate  1 tablet Oral BID   Continuous Infusions: . sodium chloride    .  ceFAZolin (ANCEF) IV Stopped (12/08/16 1200)   PRN Meds:.acetaminophen **OR** acetaminophen, ipratropium-albuterol, lidocaine, lidocaine-prilocaine, morphine, ondansetron **OR** ondansetron (ZOFRAN) IV Medications Prior to Admission:  Prior to Admission medications   Medication Sig Start Date End Date Taking? Authorizing Provider  gabapentin (NEURONTIN) 300 MG  capsule Take 1 capsule (300 mg total) by mouth 3 (three) times daily. 08/24/16  Yes Gorsuch, Ni, MD  lidocaine-prilocaine (EMLA) cream Apply to affected area once 06/14/16  Yes Gorsuch, Ni, MD  morphine (MSIR) 15 MG tablet Take 1 tablet (15 mg total) by mouth every 4 (four) hours as needed for severe pain. 11/23/16  Yes [name], MD  Nutritional Supplements (FEEDING SUPPLEMENT, OSMOLITE 1.5 CAL,) LIQD Begin Osmolite 1.5 via PEG TID with 60 mL free water. Advance slowly to  goal of 2 bottles QID with 60 mL free water before and after. In addition, drink or flush PEG with 240 mL free water TID. 11/19/16  Yes Florencia Reasons, MD  senna-docusate (SENOKOT-S) 8.6-50 MG tablet Take 1 tablet by mouth 2 (two) times daily. 06/14/16  Yes [name], MD  Water For Irrigation, Sterile (FREE WATER) SOLN Place 100 mLs into feeding tube every 4 (  four) hours. 11/19/16  Yes Florencia Reasons, MD  ondansetron (ZOFRAN) 8 MG tablet Take 1 tablet (8 mg total) by mouth 2 (two) times daily as needed. Start on the third day after chemotherapy. Patient not taking: Reported on 10/18/2016 06/14/16   [name], MD   No Known Allergies Review of Systems  Constitutional: Positive for activity change, appetite change and fatigue.  Musculoskeletal: Positive for neck pain.  Neurological: Positive for weakness.  Psychiatric/Behavioral: Positive for sleep disturbance.    Physical Exam  General: Alert, awake, in no acute distress.  Chronically ill appearing HEENT: No bruits, no goiter, no JVD Heart: Regular rate and rhythm. No murmur appreciated. Lungs: Good air movement, clear Abdomen: Soft, nontender, nondistended, positive bowel sounds.  Ext: No significant edema Skin: Warm and dry Neuro: Grossly intact, nonfocal. Psych: irritable  Vital Signs: BP (!) 114/57 (BP Location: Left Arm)   Pulse (!) 128 Comment: walking  Temp 99.4 F (37.4 C) (Oral)   Resp 16   Ht '5\' 3"'$  (1.6 m)   Wt 58.7 kg (129 lb 6.6 oz)   SpO2 (!) 86% Comment: walking   BMI 22.92 kg/m  Pain Assessment: 0-10 POSS *See Group Information*: S-Acceptable,Sleep, easy to arouse Pain Score: 0-No pain   SpO2: SpO2: (!) 86 % (walking) O2 Device:SpO2: (!) 86 % (walking) O2 Flow Rate: .O2 Flow Rate (L/min): 4 L/min  IO: Intake/output summary:  Intake/Output Summary (Last 24 hours) at 12/08/16 1457 Last data filed at 12/08/16 1300  Gross per 24 hour  Intake          6946.33 ml  Output             1975 ml  Net          4971.33 ml    LBM: Last BM Date: 12/05/16 Baseline Weight: Weight: 58.1 kg (128 lb) Most recent weight: Weight: 58.7 kg (129 lb 6.6 oz)     Palliative Assessment/Data:   Flowsheet Rows     Most Recent Value  Intake Tab  Referral Department  Hospitalist  Unit at Time of Referral  Intermediate Care Unit  Palliative Care Primary Diagnosis  Cancer  Date Notified  12/06/16  Palliative Care Type  Return patient Palliative Care  Reason for referral  Clarify Goals of Care  Date of Admission  12/06/2016  Date first seen by Palliative Care  12/08/16  # of days Palliative referral response time  2 Day(s)  # of days IP prior to Palliative referral  2  Clinical Assessment  Palliative Performance Scale Score  40%  Pain Max last 24 hours  4  Pain Min Last 24 hours  3  Psychosocial & Spiritual Assessment  Palliative Care Outcomes  Patient/Family meeting held?  Yes  Who was at the meeting?  Patient      Time In: 1330 Time Out: 1430 Time Total: 60 Greater than 50%  of this time was spent counseling and coordinating care related to the above assessment and plan.  Signed by: Micheline Rough, MD   Please contact Palliative Medicine Team phone at [phone] for questions and concerns.  For individual provider: See Shea Evans

## 2016-12-08 NOTE — Progress Notes (Signed)
Physical Therapy Treatment Patient Details Name: Antonio [name] MRN: [id number] DOB: 04/17/1955 Today's Date: 12/08/2016    History of Present Illness  61 y.o. male with medical history significant of supraglottic cancer(s/p resection, chemotherapy, radiation,and PEG followed by [name]), COPD, sickle cell trait; who presents with complaints of generalized body aches, fall. Dx of sepsis due to PNA. Recent admission 10/1-10/7/18 with hypercalcemia.     PT Comments    Pt ambulated 40' with RW, SaO2 86% on RA, HR 128. Distance limited by L knee pain and fatigue.   Follow Up Recommendations  Home health PT     Equipment Recommendations  Rolling walker with 5" wheels    Recommendations for Other Services       Precautions / Restrictions Precautions Precautions: Fall Precaution Comments: pt reports "slipping" recently; otherwise denies h/o falls in past 1 year Restrictions Weight Bearing Restrictions: No    Mobility  Bed Mobility               General bed mobility comments: up in recliner  Transfers Overall transfer level: Needs assistance Equipment used: Rolling walker (2 wheeled) Transfers: Sit to/from Stand Sit to Stand: Supervision         General transfer comment: VCs hand placement  Ambulation/Gait Ambulation/Gait assistance: Supervision Ambulation Distance (Feet): 40 Feet Assistive device: Rolling walker (2 wheeled) (IV pole) Gait Pattern/deviations: Step-through pattern;Decreased stride length   Gait velocity interpretation: Below normal speed for age/gender General Gait Details: steady with RW, no LOB, HR 128 walking, SaO2 86% on RA walking, 95% on 2L O2 after 2 minutes rest, distance limited by fatigue   Stairs            Wheelchair Mobility    Modified Rankin (Stroke Patients Only)       Balance Overall balance assessment: Needs assistance   Sitting balance-Leahy Scale: Good     Standing balance support: Bilateral upper  extremity supported Standing balance-Leahy Scale: Fair Standing balance comment: requires BUE support                            Cognition Arousal/Alertness: Awake/alert Behavior During Therapy: WFL for tasks assessed/performed Overall Cognitive Status: Within Functional Limits for tasks assessed                                        Exercises      General Comments        Pertinent Vitals/Pain Pain Score: 10-Worst pain ever Pain Location: L knee with walking ("sickle cell pain") Pain Descriptors / Indicators: Aching Pain Intervention(s): Limited activity within patient's tolerance;Monitored during session;Patient requesting pain meds-RN notified    Home Living                      Prior Function            PT Goals (current goals can now be found in the care plan section) Acute Rehab PT Goals Patient Stated Goal: none stated PT Goal Formulation: With patient Time For Goal Achievement: 12/19/16 Potential to Achieve Goals: Good Progress towards PT goals: Progressing toward goals    Frequency    Min 3X/week      PT Plan Current plan remains appropriate    Co-evaluation              AM-PAC PT "6 Clicks" Daily  Activity  Outcome Measure  Difficulty turning over in bed (including adjusting bedclothes, sheets and blankets)?: None Difficulty moving from lying on back to sitting on the side of the bed? : A Lot Difficulty sitting down on and standing up from a chair with arms (e.g., wheelchair, bedside commode, etc,.)?: A Little Help needed moving to and from a bed to chair (including a wheelchair)?: A Little Help needed walking in hospital room?: A Little Help needed climbing 3-5 steps with a railing? : A Lot 6 Click Score: 17    End of Session Equipment Utilized During Treatment: Gait belt Activity Tolerance: Patient limited by fatigue;Patient limited by pain Patient left: in chair;with call bell/phone within  reach Nurse Communication: Mobility status PT Visit Diagnosis: Difficulty in walking, not elsewhere classified (R26.2);Muscle weakness (generalized) (M62.81)     Time: 4383-8184 PT Time Calculation (min) (ACUTE ONLY): 18 min  Charges:  $Gait Training: 8-22 mins                    G Codes:          Blondell Reveal Kistler 12/08/2016, 1:54 PM [phone]

## 2016-12-08 NOTE — Progress Notes (Signed)
INFECTIOUS DISEASE PROGRESS NOTE  ID: Antonio [name] is a 61 y.o. 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  Breathing easier after thoracentesis.  Denies SOB or CP.   Abtx:  Anti-infectives    Start     Dose/Rate Route Frequency Ordered Stop   12/06/16 1000  vancomycin (VANCOCIN) IVPB 750 mg/150 ml premix  Status:  Discontinued     750 mg 150 mL/hr over 60 Minutes Intravenous Every 12 hours 12/05/16 0008 12/05/16 0009   12/05/16 1800  ceFAZolin (ANCEF) IVPB 2g/100 mL premix     2 g 200 mL/hr over 30 Minutes Intravenous Every 8 hours 12/05/16 1122     12/05/16 1000  vancomycin (VANCOCIN) IVPB 750 mg/150 ml premix  Status:  Discontinued     750 mg 150 mL/hr over 60 Minutes Intravenous Every 12 hours 12/05/16 0009 12/05/16 1118   12/05/16 0200  ceFEPIme (MAXIPIME) 1 g in dextrose 5 % 50 mL IVPB  Status:  Discontinued     1 g 100 mL/hr over 30 Minutes Intravenous Every 8 hours 12/05/16 0009 12/05/16 1118   11/30/2016 1930  vancomycin (VANCOCIN) IVPB 1000 mg/200 mL premix     1,000 mg 200 mL/hr over 60 Minutes Intravenous  Once 11/26/2016 1915 11/19/2016 2238   12/08/2016 1930  piperacillin-tazobactam (ZOSYN) IVPB 3.375 g     3.375 g 100 mL/hr over 30 Minutes Intravenous  Once 12/01/2016 1915 11/21/2016 2001      Medications:  Scheduled: . chlorhexidine  15 mL Mouth Rinse BID  . enoxaparin (LOVENOX) injection  40 mg Subcutaneous Q24H  . feeding supplement (OSMOLITE 1.5 CAL)  237 mL Per Tube 6 X Daily  . free water  100  mL Per Tube Q4H  . gabapentin  300 mg Per Tube TID  . mouth rinse  15 mL Mouth Rinse q12n4p  . senna-docusate  1 tablet Oral BID    Objective: Vital signs in last 24 hours: Temp:  [98.4 F (36.9 C)-99.9 F (37.7 C)] 98.5 F (36.9 C) (10/26 0800) Pulse Rate:  [119] 119 (10/25 1800) Resp:  [12-42] 16 (10/26 1200) BP: (97-175)/(57-123) 114/57 (10/26 1200) SpO2:  [92 %-100 %] 98 % (10/26 1200)   General appearance: alert, cooperative and no distress Resp: diminished breath sounds bilaterally Chest wall: dressed, anteriorly.  Cardio: regular rate and rhythm GI: normal findings: bowel sounds normal and soft, non-tender  Lab Results  Recent Labs  12/07/16 0327 12/08/16 0321  WBC 3.1* 5.2  HGB 9.6* 9.5*  HCT 27.5* 28.0*  NA 140 140  K 2.9* 3.0*  CL 107 103  CO2 26 29  BUN 15 12  CREATININE 0.66 0.65   Liver Panel  Recent Labs  12/06/16 0405 12/07/16 1104  PROT 5.2* 5.1*  ALBUMIN 2.0*  --   AST 53*  --   ALT 52  --   ALKPHOS 90  --   BILITOT 0.9  --    Sedimentation Rate No results for input(s): ESRSEDRATE in the last 72  hours. C-Reactive Protein No results for input(s): CRP in the last 72 hours.  Microbiology: Recent Results (from the past 240 hour(s))  Culture, blood (Routine x 2)     Status: Abnormal   Collection Time: 12/01/2016  6:37 PM  Result Value Ref Range Status   Specimen Description BLOOD RIGHT ANTECUBITAL  Final   Special Requests   Final    BOTTLES DRAWN AEROBIC AND ANAEROBIC Blood Culture adequate volume   Culture  Setup Time   Final    GRAM POSITIVE COCCI IN BOTH AEROBIC AND ANAEROBIC BOTTLES CRITICAL RESULT CALLED TO, READ BACK BY AND VERIFIED WITH: M. Chanetta Marshall.D. 10:55 12/05/16 ([email or web address]) Performed at [facility], [place] [address], [place], [place] [address]    Culture STAPHYLOCOCCUS AUREUS (A)  Final   Report Status 12/07/2016 FINAL  Final   Organism ID, Bacteria STAPHYLOCOCCUS AUREUS  Final      Susceptibility   Staphylococcus  aureus - MIC*    CIPROFLOXACIN <=0.5 SENSITIVE Sensitive     ERYTHROMYCIN <=0.25 SENSITIVE Sensitive     GENTAMICIN <=0.5 SENSITIVE Sensitive     OXACILLIN 0.5 SENSITIVE Sensitive     TETRACYCLINE <=1 SENSITIVE Sensitive     VANCOMYCIN 1 SENSITIVE Sensitive     TRIMETH/SULFA <=10 SENSITIVE Sensitive     CLINDAMYCIN <=0.25 SENSITIVE Sensitive     RIFAMPIN <=0.5 SENSITIVE Sensitive     Inducible Clindamycin NEGATIVE Sensitive     * STAPHYLOCOCCUS AUREUS  Blood Culture ID Panel (Reflexed)     Status: Abnormal   Collection Time: 12/07/2016  6:37 PM  Result Value Ref Range Status   Enterococcus species NOT DETECTED NOT DETECTED Final   Listeria monocytogenes NOT DETECTED NOT DETECTED Final   Staphylococcus species DETECTED (A) NOT DETECTED Final    Comment: CRITICAL RESULT CALLED TO, READ BACK BY AND VERIFIED WITH: M. Chanetta Marshall.D. 10:55 12/05/16 ([email or web address])    Staphylococcus aureus DETECTED (A) NOT DETECTED Final    Comment: Methicillin (oxacillin) susceptible Staphylococcus aureus (MSSA). Preferred therapy is anti staphylococcal beta lactam antibiotic (Cefazolin or Nafcillin), unless clinically contraindicated. CRITICAL RESULT CALLED TO, READ BACK BY AND VERIFIED WITH: M. Lear Ng Pharm.D. 10:55 12/05/16 (wilsonm)    Methicillin resistance NOT DETECTED NOT DETECTED Final   Streptococcus species NOT DETECTED NOT DETECTED Final   Streptococcus agalactiae NOT DETECTED NOT DETECTED Final   Streptococcus pneumoniae NOT DETECTED NOT DETECTED Final   Streptococcus pyogenes NOT DETECTED NOT DETECTED Final   Acinetobacter baumannii NOT DETECTED NOT DETECTED Final   Enterobacteriaceae species NOT DETECTED NOT DETECTED Final   Enterobacter cloacae complex NOT DETECTED NOT DETECTED Final   Escherichia coli NOT DETECTED NOT DETECTED Final   Klebsiella oxytoca NOT DETECTED NOT DETECTED Final   Klebsiella pneumoniae NOT DETECTED NOT DETECTED Final   Proteus species NOT DETECTED NOT DETECTED Final    Serratia marcescens NOT DETECTED NOT DETECTED Final   Haemophilus influenzae NOT DETECTED NOT DETECTED Final   Neisseria meningitidis NOT DETECTED NOT DETECTED Final   Pseudomonas aeruginosa NOT DETECTED NOT DETECTED Final   Candida albicans NOT DETECTED NOT DETECTED Final   Candida glabrata NOT DETECTED NOT DETECTED Final   Candida krusei NOT DETECTED NOT DETECTED Final   Candida parapsilosis NOT DETECTED NOT DETECTED Final   Candida tropicalis NOT DETECTED NOT DETECTED Final    Comment: Performed at [facility], [address], [place],  [address]  Culture, blood (Routine x 2)     Status: Abnormal   Collection Time: 11/20/2016  6:40 PM  Result Value Ref Range Status   Specimen Description BLOOD RIGHT FOREARM  Final   Special Requests   Final    BOTTLES DRAWN AEROBIC AND ANAEROBIC Blood Culture adequate volume   Culture  Setup Time   Final    GRAM POSITIVE COCCI IN BOTH AEROBIC AND ANAEROBIC BOTTLES CRITICAL VALUE NOTED.  VALUE IS CONSISTENT WITH PREVIOUSLY REPORTED AND CALLED VALUE.    Culture (A)  Final    STAPHYLOCOCCUS AUREUS SUSCEPTIBILITIES PERFORMED ON PREVIOUS CULTURE WITHIN THE LAST 5 DAYS. Performed at [facility], [place] [address], [place], [place] [address]    Report Status 12/07/2016 FINAL  Final  Culture, sputum-assessment     Status: None   Collection Time: 11/18/2016 11:40 PM  Result Value Ref Range Status   Specimen Description SPUTUM  Final   Special Requests Immunocompromised  Final   Sputum evaluation THIS SPECIMEN IS ACCEPTABLE FOR SPUTUM CULTURE  Final   Report Status 12/05/2016 FINAL  Final  Culture, respiratory (NON-Expectorated)     Status: None (Preliminary result)   Collection Time: 12/12/2016 11:40 PM  Result Value Ref Range Status   Specimen Description SPUTUM  Final   Special Requests Immunocompromised Reflexed from [id number]  Final   Gram Stain   Final    RARE WBC PRESENT,BOTH PMN AND MONONUCLEAR ABUNDANT SQUAMOUS EPITHELIAL CELLS  PRESENT MODERATE GRAM POSITIVE COCCI MODERATE GRAM POSITIVE RODS MODERATE GRAM NEGATIVE RODS    Culture   Final    CULTURE REINCUBATED FOR BETTER GROWTH Performed at [facility], [place] [address], [place], [place] [address]    Report Status PENDING  Incomplete  Rapid strep screen     Status: None   Collection Time: 12/05/16  4:04 PM  Result Value Ref Range Status   Streptococcus, Group A Screen (Direct) NEGATIVE NEGATIVE Final    Comment: (NOTE) A Rapid Antigen test may result negative if the antigen level in the sample is below the detection level of this test. The FDA has not cleared this test as a stand-alone test therefore the rapid antigen negative result has reflexed to a Group A Strep culture.   Culture, group A strep     Status: None   Collection Time: 12/05/16  4:04 PM  Result Value Ref Range Status   Specimen Description THROAT  Final   Special Requests NONE Reflexed from [id number]  Final   Culture   Final    NO GROUP A STREP (S.PYOGENES) ISOLATED Performed at [facility], [address] [place], [place] [address]    Report Status 12/08/2016 FINAL  Final  MRSA PCR Screening     Status: None   Collection Time: 12/05/16  8:20 PM  Result Value Ref Range Status   MRSA by PCR NEGATIVE NEGATIVE Final    Comment:        The GeneXpert MRSA Assay (FDA approved for NASAL specimens only), is one component of a comprehensive MRSA colonization surveillance program. It is not intended to diagnose MRSA infection nor to guide or monitor treatment for MRSA infections.   Culture, blood (Routine X 2) w Reflex to ID Panel     Status: None (Preliminary result)   Collection Time: 12/06/16  4:05 AM  Result Value Ref Range Status   Specimen Description BLOOD LEFT HAND  Final   Special Requests   Final    BOTTLES DRAWN AEROBIC AND ANAEROBIC Blood Culture adequate volume   Culture   Final  NO GROWTH 2 DAYS Performed at [facility], [place] [address],  [place], [place] [address]    Report Status PENDING  Incomplete  Aerobic/Anaerobic Culture (surgical/deep wound)     Status: None (Preliminary result)   Collection Time: 12/06/16 12:41 PM  Result Value Ref Range Status   Specimen Description WOUND RIGHT CHEST  Final   Special Requests Normal  Final   Gram Stain   Final    ABUNDANT WBC PRESENT, PREDOMINANTLY PMN ABUNDANT GRAM POSITIVE COCCI IN PAIRS    Culture   Final    ABUNDANT STAPHYLOCOCCUS AUREUS SUSCEPTIBILITIES TO FOLLOW Performed at [facility], [place] [address], [place], [place] [address]    Report Status PENDING  Incomplete  Body fluid culture     Status: None (Preliminary result)   Collection Time: 12/07/16 10:31 AM  Result Value Ref Range Status   Specimen Description PLEURAL RIGHT  Final   Special Requests Immunocompromised  Final   Gram Stain   Final    ABUNDANT WBC PRESENT,BOTH PMN AND MONONUCLEAR NO ORGANISMS SEEN    Culture   Final    NO GROWTH < 24 HOURS Performed at [facility], [place] [address], [place], [place] [address]    Report Status PENDING  Incomplete    Studies/Results: Ct Chest Wo Contrast  Result Date: 12/06/2016 CLINICAL DATA:  Acute onset of generalized weakness. Status post fall. Confusion. Current history of supraglottic laryngeal cancer, status post chemotherapy and radiation therapy. EXAM: CT CHEST WITHOUT CONTRAST TECHNIQUE: Multidetector CT imaging of the chest was performed following the standard protocol without IV contrast. COMPARISON:  CT of the chest performed 11/13/2016 FINDINGS: Cardiovascular: Diffuse coronary artery calcifications are seen. The heart remains normal in size. Mild calcification is noted along the aortic arch and proximal great vessels. Mediastinum/Nodes: Enlarged mediastinal and bilateral hilar nodes are again seen, less well characterized without contrast but concerning for metastatic disease. As before, mediastinal nodes measure up to 2.3 cm in short axis. Trace  pericardial fluid remains within normal limits. The thyroid gland is unremarkable. No axillary lymphadenopathy is seen. Lungs/Pleura: Small to moderate right and small left pleural effusions are noted. There is partial consolidation of the right lower lobe, concerning for pneumonia. Nodular opacities are noted within both lungs, concerning for metastatic disease. Mild underlying interstitial prominence is noted. No pneumothorax is identified. Upper Abdomen: Vague hypodensities within the hepatic dome measure up to 2.8 cm in size, concerning for metastatic disease. The spleen is unremarkable in appearance. The visualized portions of the gallbladder, pancreas, adrenal glands and kidneys are within normal limits. Nonspecific perinephric stranding is noted bilaterally. A G-tube is noted ending at the body of the stomach. Scattered calcification is seen along the proximal abdominal aorta and its branches. Musculoskeletal: No acute osseous abnormalities are identified. Packing material is noted at a soft tissue defect at the right chest wall, status post removal of the patient's right-sided chest port. The visualized musculature is unremarkable in appearance. IMPRESSION: 1. Small to moderate right and small left pleural effusions. Increased partial consolidation of the right lower lung lobe, concerning for pneumonia. 2. Nodular opacities within both lungs, concerning for metastatic disease. 3. Enlarged mediastinal and bilateral hilar nodes remain concerning for metastatic disease. 4. Vague hypodensities at the hepatic dome measuring 2.8 cm in size, concerning for metastatic disease. 5. Scattered aortic atherosclerosis. 6. Diffuse coronary artery calcifications. 7. Packing material at a soft tissue defect at the right chest wall, status post removal of  right-sided chest port. Electronically Signed   By: Garald Balding M.D.   On: 12/06/2016 22:48   Dg Chest Port 1 View  Result Date: 12/07/2016 CLINICAL DATA:  Status  post right-sided thoracentesis EXAM: PORTABLE CHEST 1 VIEW COMPARISON:  Chest radiograph and chest CT December 06, 2016 FINDINGS: Right pleural effusion is smaller following thoracentesis. No pneumothorax. There is a small pleural effusion on each side currently. There is atelectatic change in the lung bases. Heart is upper normal in size with pulmonary vascularity within normal limits. There is aortic atherosclerosis. No adenopathy. No evident bone lesions. IMPRESSION: No pneumothorax. Small pleural effusions bilaterally with bibasilar atelectasis. Heart is upper normal in size. No adenopathy evident. There is aortic atherosclerosis. Aortic Atherosclerosis (ICD10-I70.0). Electronically Signed   By: Lowella Grip III M.D.   On: 12/07/2016 10:54     Assessment/Plan: Infected port Methicillin sensitive Staph aureus bacteremia ? 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 He is asx with regards to this.  Afeb since 10-23 and BP improved.  WBC back to normal Hypokalemia to be repleted by primary team.  Await repeat BCx (sent 10-24), no growth so far.  Cx of port site MSSA Await pleural fluid Cx.  TTE does not specifically mention valve vegetations. my appreciation to CV for comment on TEE.  Appreciate IR removal of port Repeat Hep and HIV negative         Bobby Rumpf MD, FACP Infectious Diseases (pager) [phone] [email or web address] 12/08/2016, 1:30 PM  LOS: 4 days

## 2016-12-08 NOTE — Progress Notes (Signed)
Asked to see pt for possible TEE. After review of the chart and interviewing the pt it's not clear a TEE is feasible. Will review with MD, also see Dr Algis Downs note 10/23.   Kerin Ransom PA-C 12/08/2016 8:10 AM

## 2016-12-09 ENCOUNTER — Inpatient Hospital Stay (HOSPITAL_COMMUNITY): Payer: Medicare Other

## 2016-12-09 LAB — BASIC METABOLIC PANEL
ANION GAP: 10 (ref 5–15)
BUN: 11 mg/dL (ref 6–20)
CHLORIDE: 97 mmol/L — AB (ref 101–111)
CO2: 31 mmol/L (ref 22–32)
Calcium: 8.9 mg/dL (ref 8.9–10.3)
Creatinine, Ser: 0.6 mg/dL — ABNORMAL LOW (ref 0.61–1.24)
GFR calc Af Amer: >60 mL/min (ref >60)
GLUCOSE: 121 mg/dL — AB (ref 65–99)
POTASSIUM: 3.3 mmol/L — AB (ref 3.5–5.1)
Sodium: 138 mmol/L (ref 135–145)

## 2016-12-09 LAB — GLUCOSE, CAPILLARY
GLUCOSE-CAPILLARY: 160 mg/dL — AB (ref 65–99)
Glucose-Capillary: 140 mg/dL — ABNORMAL HIGH (ref 65–99)
Glucose-Capillary: 133 mg/dL — ABNORMAL HIGH (ref 65–99)

## 2016-12-09 LAB — RETICULOCYTES
RBC.: 3.19 MIL/uL — ABNORMAL LOW (ref 4.22–5.81)
RETIC CT PCT: 0.5 % (ref 0.4–3.1)
Retic Count, Absolute: 16 10*3/uL — ABNORMAL LOW (ref 19.0–186.0)

## 2016-12-09 LAB — IRON AND TIBC: Iron: 12 ug/dL — ABNORMAL LOW (ref 45–182)

## 2016-12-09 LAB — FOLATE: FOLATE: 19.5 ng/mL (ref >5.9)

## 2016-12-09 LAB — TYPE AND SCREEN
ABO/RH(D): O POS
Antibody Screen: NEGATIVE

## 2016-12-09 LAB — CBC
HEMATOCRIT: 23.6 % — AB (ref 39.0–52.0)
HEMOGLOBIN: 8.1 g/dL — AB (ref 13.0–17.0)
MCH: 27.2 pg (ref 26.0–34.0)
MCHC: 34.3 g/dL (ref 30.0–36.0)
MCV: 79.2 fL (ref 78.0–100.0)
PLATELETS: 197 10*3/uL (ref 150–400)
RBC: 2.98 MIL/uL — AB (ref 4.22–5.81)
RDW: 16.6 % — ABNORMAL HIGH (ref 11.5–15.5)
WBC: 7.3 10*3/uL (ref 4.0–10.5)

## 2016-12-09 LAB — VITAMIN B12: VITAMIN B 12: 1044 pg/mL — AB (ref 180–914)

## 2016-12-09 LAB — FERRITIN: Ferritin: 1024 ng/mL — ABNORMAL HIGH (ref 24–336)

## 2016-12-09 MED ORDER — LACTATED RINGERS IV SOLN
INTRAVENOUS | Status: AC
  Administered 2016-12-09: 08:00:00 via INTRAVENOUS

## 2016-12-09 MED ORDER — MAGNESIUM SULFATE IN D5W 1-5 GM/100ML-% IV SOLN
1.0000 g | Freq: Once | INTRAVENOUS | Status: AC
  Administered 2016-12-09: 1 g via INTRAVENOUS
  Filled 2016-12-09: qty 100

## 2016-12-09 MED ORDER — PANTOPRAZOLE SODIUM 40 MG IV SOLR
40.0000 mg | Freq: Two times a day (BID) | INTRAVENOUS | Status: DC
  Administered 2016-12-09 – 2016-12-17 (×18): 40 mg via INTRAVENOUS
  Filled 2016-12-09 (×15): qty 40

## 2016-12-09 MED ORDER — METOPROLOL TARTRATE 5 MG/5ML IV SOLN
10.0000 mg | Freq: Once | INTRAVENOUS | Status: AC
  Administered 2016-12-09: 10 mg via INTRAVENOUS
  Filled 2016-12-09: qty 10

## 2016-12-09 MED ORDER — POTASSIUM CHLORIDE 20 MEQ/15ML (10%) PO SOLN
40.0000 meq | Freq: Once | ORAL | Status: AC
  Administered 2016-12-09: 40 meq via ORAL
  Filled 2016-12-09: qty 30

## 2016-12-09 MED ORDER — POTASSIUM CHLORIDE 10 MEQ/100ML IV SOLN
10.0000 meq | INTRAVENOUS | Status: DC
  Administered 2016-12-09: 10 meq via INTRAVENOUS
  Filled 2016-12-09: qty 100

## 2016-12-09 NOTE — Progress Notes (Signed)
Referring Physician(s): Dr Ronnie Derby Dr [name]  Supervising Physician: Arne [place]  Patient Status:  [facility] - In-pt  Chief Complaint:  Infected PAC  Subjective:  PAC placed 06/23/2016 Removed secondary infection 12/06/16 Packed dressing in place Looks clean and dry Pt has no complaints   Allergies: Patient has no known allergies.  Medications: Prior to Admission medications   Medication Sig Start Date End Date Taking? Authorizing Provider  gabapentin (NEURONTIN) 300 MG capsule Take 1 capsule (300 mg total) by mouth 3 (three) times daily. 08/24/16  Yes Gorsuch, Ni, MD  lidocaine-prilocaine (EMLA) cream Apply to affected area once 06/14/16  Yes Gorsuch, Ni, MD  morphine (MSIR) 15 MG tablet Take 1 tablet (15 mg total) by mouth every 4 (four) hours as needed for severe pain. 11/23/16  Yes [name], MD  Nutritional Supplements (FEEDING SUPPLEMENT, OSMOLITE 1.5 CAL,) LIQD Begin Osmolite 1.5 via PEG TID with 60 mL free water. Advance slowly to  goal of 2 bottles QID with 60 mL free water before and after. In addition, drink or flush PEG with 240 mL free water TID. 11/19/16  Yes Florencia Reasons, MD  senna-docusate (SENOKOT-S) 8.6-50 MG tablet Take 1 tablet by mouth 2 (two) times daily. 06/14/16  Yes [name], MD  Water For Irrigation, Sterile (FREE WATER) SOLN Place 100 mLs into feeding tube every 4 (four) hours. 11/19/16  Yes Florencia Reasons, MD  ondansetron (ZOFRAN) 8 MG tablet Take 1 tablet (8 mg total) by mouth 2 (two) times daily as needed. Start on the third day after chemotherapy. Patient not taking: Reported on 10/18/2016 06/14/16   [name], MD     Vital Signs: BP 120/60   Pulse (!) 128 Comment: walking  Temp 99 F (37.2 C) (Oral)   Resp 20   Ht 5\' 3"  (1.6 m)   Wt 129 lb 10.1 oz (58.8 kg)   SpO2 100%   BMI 22.96 kg/m   Physical Exam  Musculoskeletal: Normal range of motion.  Neurological: He is alert.  Skin: Skin is warm and dry.  Site is clean and dry Packing in  place NT No bleeding No sign of infection notes  Nursing note and vitals reviewed.   Imaging: Ct Chest Wo Contrast  Result Date: 12/06/2016 CLINICAL DATA:  Acute onset of generalized weakness. Status post fall. Confusion. Current history of supraglottic laryngeal cancer, status post chemotherapy and radiation therapy. EXAM: CT CHEST WITHOUT CONTRAST TECHNIQUE: Multidetector CT imaging of the chest was performed following the standard protocol without IV contrast. COMPARISON:  CT of the chest performed 11/13/2016 FINDINGS: Cardiovascular: Diffuse coronary artery calcifications are seen. The heart remains normal in size. Mild calcification is noted along the aortic arch and proximal great vessels. Mediastinum/Nodes: Enlarged mediastinal and bilateral hilar nodes are again seen, less well characterized without contrast but concerning for metastatic disease. As before, mediastinal nodes measure up to 2.3 cm in short axis. Trace pericardial fluid remains within normal limits. The thyroid gland is unremarkable. No axillary lymphadenopathy is seen. Lungs/Pleura: Small to moderate right and small left pleural effusions are noted. There is partial consolidation of the right lower lobe, concerning for pneumonia. Nodular opacities are noted within both lungs, concerning for metastatic disease. Mild underlying interstitial prominence is noted. No pneumothorax is identified. Upper Abdomen: Vague hypodensities within the hepatic dome measure up to 2.8 cm in size, concerning for metastatic disease. The spleen is unremarkable in appearance. The visualized portions of the gallbladder, pancreas, adrenal glands and kidneys are within  normal limits. Nonspecific perinephric stranding is noted bilaterally. A G-tube is noted ending at the body of the stomach. Scattered calcification is seen along the proximal abdominal aorta and its branches. Musculoskeletal: No acute osseous abnormalities are identified. Packing material is  noted at a soft tissue defect at the right chest wall, status post removal of the patient's right-sided chest port. The visualized musculature is unremarkable in appearance. IMPRESSION: 1. Small to moderate right and small left pleural effusions. Increased partial consolidation of the right lower lung lobe, concerning for pneumonia. 2. Nodular opacities within both lungs, concerning for metastatic disease. 3. Enlarged mediastinal and bilateral hilar nodes remain concerning for metastatic disease. 4. Vague hypodensities at the hepatic dome measuring 2.8 cm in size, concerning for metastatic disease. 5. Scattered aortic atherosclerosis. 6. Diffuse coronary artery calcifications. 7. Packing material at a soft tissue defect at the right chest wall, status post removal of right-sided chest port. Electronically Signed   By: Garald Balding M.D.   On: 12/06/2016 22:48   Ir Removal [employer] W/ [place] W/o Fl Mod Sed  Result Date: 12/06/2016 CLINICAL DATA:  History of laryngeal carcinoma and status post right jugular Port-A-Cath placement on 06/23/2016. No admitted with staph aureus bacteremia and requiring removal of the Port-A-Cath. EXAM: REMOVAL OF IMPLANTED TUNNELED PORT-A-CATH MEDICATIONS: Moderate (conscious) sedation was employed during this procedure. A total of Versed 2.0 mg and Fentanyl 100 mcg was administered intravenously. Moderate Sedation Time: 25 minutes. The patient's level of consciousness and vital signs were monitored continuously by radiology nursing throughout the procedure under my direct supervision. PROCEDURE: A time-out was performed prior to initiating the procedure. The right chest Port-A-Cath site was prepped with chlorhexidine. A sterile gown and gloves were worn during the procedure. Local anesthesia was provided with 1% lidocaine. An incision was made overlying the Port-A-Cath with a #15 scalpel. Utilizing sharp and blunt dissection, the Port-A-Cath was removed. Fluid was aspirated from the  port pocket via a dilator and sent for culture analysis. Portable cautery was utilized. Retention sutures were removed. The pocket was irrigated with sterile saline and debrided with wet gauze. The pocket was then packed with iodoform gauze. FINDINGS: With initial incision, there was return of purulent and bloody fluid from the port pocket. Approximately 5 mL was able to be aspirated and sent for culture analysis. The port was removed in its entirety. After vigorous debridement and irrigation of the pocket, the pocket was packed with iodoform gauze. This will be left in for 24 hours and removed. Wet-to-dry dressings will then be performed to let the pocket heal by secondary intention. IMPRESSION: Removal of implanted Port-A-Cath utilizing sharp and blunt dissection. There is evidence of port pocket infection with bloody and purulent fluid aspirated from the pocket. A sample was sent for culture analysis. The pocket was packed with iodoform gauze after port removal and will be allowed to heal secondarily. Electronically Signed   By: Aletta Edouard M.D.   On: 12/06/2016 13:48   Dg Chest Port 1 View  Result Date: 12/09/2016 CLINICAL DATA:  Pleural effusion. EXAM: PORTABLE CHEST 1 VIEW COMPARISON:  Chest x-ray dated December 07, 2016. FINDINGS: The cardiomediastinal silhouette is normal in size. Normal pulmonary vascularity. Interval increase in size of small right pleural effusion. Adjacent right basilar atelectasis. The left lung is clear. No pneumothorax. No acute osseous abnormality. IMPRESSION: Increase in size of small right pleural effusion. Electronically Signed   By: Titus Dubin M.D.   On: 12/09/2016 07:25   Dg  Chest Port 1 View  Result Date: 12/07/2016 CLINICAL DATA:  Status post right-sided thoracentesis EXAM: PORTABLE CHEST 1 VIEW COMPARISON:  Chest radiograph and chest CT December 06, 2016 FINDINGS: Right pleural effusion is smaller following thoracentesis. No pneumothorax. There is a small  pleural effusion on each side currently. There is atelectatic change in the lung bases. Heart is upper normal in size with pulmonary vascularity within normal limits. There is aortic atherosclerosis. No adenopathy. No evident bone lesions. IMPRESSION: No pneumothorax. Small pleural effusions bilaterally with bibasilar atelectasis. Heart is upper normal in size. No adenopathy evident. There is aortic atherosclerosis. Aortic Atherosclerosis (ICD10-I70.0). Electronically Signed   By: Lowella Grip III M.D.   On: 12/07/2016 10:54   Dg Chest Port 1 View  Result Date: 12/06/2016 CLINICAL DATA:  Followup sepsis. EXAM: PORTABLE CHEST 1 VIEW COMPARISON:  11/29/2016 FINDINGS: Power port is unchanged with its tip in the SVC. Widespread bilateral pulmonary infiltrates persist, more extensive on the right than the left, with layering right pleural effusion. Probable slight worsening of patchy airspace density. IMPRESSION: Slight worsening of bilateral airspace density/infiltrate more extensive on the right than the left. Associated right effusion. Electronically Signed   By: Nelson Chimes M.D.   On: 12/06/2016 07:04    Labs:  CBC:  Recent Labs  12/06/16 0405 12/07/16 0327 12/08/16 0321 12/09/16 0316  WBC 2.6* 3.1* 5.2 7.3  HGB 8.7* 9.6* 9.5* 8.1*  HCT 24.8* 27.5* 28.0* 23.6*  PLT 126* 139* 173 197    COAGS:  Recent Labs  05/18/16 0642 06/23/16 0954 12/09/2016 1837  INR 1.10 1.01 1.29  APTT 35  --   --     BMP:  Recent Labs  12/06/16 0405 12/07/16 0327 12/08/16 0321 12/09/16 0316  NA 139 140 140 138  K 4.0 2.9* 3.0* 3.3*  CL 110 107 103 97*  CO2 23 26 29 31   GLUCOSE 110* 113* 108* 121*  BUN 20 15 12 11   CALCIUM 7.6* 8.0* 8.5* 8.9  CREATININE 0.69 0.66 0.65 0.60*  GFRNONAA >60 >60 >60 >60  GFRAA >60 >60 >60 >60    LIVER FUNCTION TESTS:  Recent Labs  11/16/16 0446 11/29/16 1037 11/20/2016 1837 12/06/16 0405 12/07/16 1104  BILITOT 0.3 0.82 1.3* 0.9  --   AST 17 32 38  53*  --   ALT 13* 50 57 52  --   ALKPHOS 55 90 91 90  --   PROT 6.1* 6.6 6.3* 5.2* 5.1*  ALBUMIN 3.0* 2.7* 2.5* 2.0*  --     Assessment and Plan:  PAC dressing is in place Packing clean and dry Will follow as needed  Electronically Signed: Hilde Churchman A, PA-C 12/09/2016, 12:23 PM   I spent a total of 15 Minutes at the the patient's bedside AND on the patient's hospital floor or unit, greater than 50% of which was counseling/coordinating care for infected [facility]

## 2016-12-09 NOTE — Progress Notes (Signed)
PROGRESS NOTE  Antonio [name]  NUU:[id number] DOB: 04/09/1955 DOA: 11/18/2016 PCP: Maryellen Pile, MD  Outpatient Specialists: Oncology, [name] Brief Narrative:  Antonio [name] is a 61 y.o. male with a history of supraglottic laryngeal CA s/p resection, chemo/radiation currently on palliative chemotherapy who was brought from home by EMS due to weakness, a fall, and confusion which was reported by his sister. He reports he slipped and fell on urine and denies any confusion. He reported chills and a productive cough to admitting MD. He denied fevers but had a temperature of 103.38F, heart rates 134-141, respirations 18-32, blood pressures 117/57-122/66, O2 saturations 91-96% on room air. Labs revealed WBC 3.7, hemoglobin 8.2, MCV 76.8, RDW 16.4, platelets 184, BUN 23, creatinine 1, albumin 2.5, lactic acid trending upwards to 1.92. Chest x-ray showed right base collapse with small pleural effusion. Urinalysis was negative for any signs of infection. He was given 3L of normal saline, empiric antibiotics and admitted. Blood cultures became positive for MSSA, antibiotics narrowed to ancef per ID recommendations. Pt became hypotensive and was transferred to the stepdown unit with CCM consult, though vitals have stabilized with fluids alone. Port was removed with purulent pocket noted on 10/24 and thoracentesis was performed 10/25.   Assessment & Plan:  Sepsis due to MSSA bacteremia due to port infection -In immunocompromised patient, with refractory hypotension prompting SDU transfer and CCM consult 10/23. Port removed 10/24. Hepatitis, HIV negative. ID following, currently on Ancef which was started on 12/05/2016, echocardiogram stable, TEE cannot be done due to laryngeal cancer. Further management and course of antibiotics defer to ID.  RLL opacity on imaginglong with right-sided pleural effusion. -CT chest done and thoracentesis obtained by pulmonary on 12/07/2016,could have compressive atelectasis  versus pneumonia has per CT findings,fluid appears to be consistent with exudate,pulmonary on hold managing this issue.  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 [name] oncology and palliative care following, patient wants to discuss further course of care with [name] and then decide on his CODE STATUS and if he is to go palliative care route.   Dysphagia -s/p PEG May 2018. Continue feeds thru PEG tube. Ok to give liquid po for pt comfort.  Pancytopenia -Suspect due to marrow suppression.  -Anemia improved 7.4 > 8.7 with transfusion 10/23. Continue monitoring.  Hemoglobin stable at 9.5 this morning  Hypokalemia -replace and monitor with magnesium levels  Fall with generalized weakness -suspect due to ongoing malignancy and chemotherapy worsened acutely by infection. -Physical therapy to evaluate.   History of hypercalcemia of malignancy -Admitted earlier Oct 2018.  -Resolved, will monitor.   Peripheral neuropathy -Continue gabapentin    DVT prophylaxis: SCDs Code Status: Full Family Communication: None at bedside this AM Disposition Plan: continue in SDU today.   Consultants:   Oncology, [name].  ID, Dr. Johnnye Sima  IR, Dr. Anselm Pancoast  CCM, Dr. Ashok Cordia   Palliative care team  Procedures:   Echocardiogram 12/06/2016: - Left ventricle: The cavity size was normal. Wall thickness was   normal. Systolic function was normal. The estimated ejection   fraction was in the range of 55% to 60%. Wall motion was normal;   there were no regional wall motion abnormalities. Doppler   parameters are consistent with abnormal left ventricular   relaxation (grade 1 diastolic dysfunction). - Mitral valve: Calcified annulus.  Impressions: - Normal LV systolic function; mild diastolic dysfunction.  Antimicrobials:  Vancomycin 10/22 > 10/23  Zosyn 10/22  Cefepime 10/22 > 10/23   Ancef  10/23 >>    Subjective: -No complaints this morning, no chest pain, no shortness of breath.  No lightheadedness or dizziness.  No abdominal pain, nausea or vomiting  Objective: Vitals:   12/09/16 0245 12/09/16 0400 12/09/16 0741 12/09/16 0800  BP:  (!) 119/54  (!) 102/55  Pulse:      Resp: 18 16  (!) 25  Temp:  99.9 F (37.7 C) 99.1 F (37.3 C)   TempSrc:  Oral Axillary   SpO2:  98%  96%  Weight:  58.8 kg (129 lb 10.1 oz)    Height:        Intake/Output Summary (Last 24 hours) at 12/09/16 1039 Last data filed at 12/09/16 0926  Gross per 24 hour  Intake             2109 ml  Output             1900 ml  Net              209 ml   Filed Weights   12/06/16 0230 12/07/16 0337 12/09/16 0400  Weight: 57.3 kg (126 lb 5.2 oz) 58.7 kg (129 lb 6.6 oz) 58.8 kg (129 lb 10.1 oz)    Awake , No new F.N deficits, Normal affect [place].AT,PERRAL Supple Neck,No JVD, No cervical lymphadenopathy appriciated.  Symmetrical Chest wall movement, Good air movement bilaterally, CTAB RRR, No Gallops,Rubs or new Murmurs, No Parasternal Heave +ve B.Sounds, Abd Soft, No tenderness, No organomegaly appriciated, No rebound - guarding or rigidity. PEG tube is in place site appears clean No Cyanosis, Clubbing or edema, No new Rash or bruise   Data Reviewed: I have personally reviewed following labs and imaging studies  CBC:  Recent Labs Lab 11/17/2016 1837 12/05/16 0244 12/06/16 0405 12/07/16 0327 12/08/16 0321 12/09/16 0316  WBC 3.7* 2.8* 2.6* 3.1* 5.2 7.3  NEUTROABS 3.2  --  2.1  --   --   --   HGB 8.2* 7.4* 8.7* 9.6* 9.5* 8.1*  HCT 23.5* 21.6* 24.8* 27.5* 28.0* 23.6*  MCV 76.8* 77.7* 78.0 78.1 78.9 79.2  PLT 184 139* 126* 139* 173 481   Basic Metabolic Panel:  Recent Labs Lab 12/05/16 1941 12/05/16 2022 12/06/16 0405 12/07/16 0327 12/08/16 0321 12/09/16 0316  NA 138 137 139 140 140 138  K 2.9* 2.8* 4.0 2.9* 3.0* 3.3*  CL 104 105 110 107 103 97*  CO2 23 23 23 26 29 31   GLUCOSE 157* 168* 110*  113* 108* 121*  BUN 21* 21* 20 15 12 11   CREATININE 0.78 0.75 0.69 0.66 0.65 0.60*  CALCIUM 7.4* 7.1* 7.6* 8.0* 8.5* 8.9  MG 2.0 2.0  --   --   --   --   PHOS 2.4* 2.5  --   --   --   --    GFR: Estimated Creatinine Clearance: 79 mL/min (A) (by C-G formula based on SCr of 0.6 mg/dL (L)). Liver Function Tests:  Recent Labs Lab 11/25/2016 1837 12/06/16 0405 12/07/16 1104  AST 38 53*  --   ALT 57 52  --   ALKPHOS 91 90  --   BILITOT 1.3* 0.9  --   PROT 6.3* 5.2* 5.1*  ALBUMIN 2.5* 2.0*  --    No results for input(s): LIPASE, AMYLASE in the last 168 hours. No results for input(s): AMMONIA in the last 168 hours. Coagulation Profile:  Recent Labs Lab 12/07/2016 1837  INR 1.29   Cardiac Enzymes:  Recent Labs Lab 11/24/2016 1924 12/05/16  0037 12/05/16 0244  TROPONINI 0.03* 0.03* <0.03   BNP (last 3 results) No results for input(s): PROBNP in the last 8760 hours. HbA1C: No results for input(s): HGBA1C in the last 72 hours. CBG:  Recent Labs Lab 12/08/16 0729 12/08/16 1206 12/08/16 1600 12/08/16 2218 12/09/16 0813  GLUCAP 84 146* 136* 94 160*   Lipid Profile: No results for input(s): CHOL, HDL, LDLCALC, TRIG, CHOLHDL, LDLDIRECT in the last 72 hours. Thyroid Function Tests: No results for input(s): TSH, T4TOTAL, FREET4, T3FREE, THYROIDAB in the last 72 hours. Anemia Panel:  Recent Labs  12/09/16 0807  RETICCTPCT 0.5   Urine analysis:    Component Value Date/Time   COLORURINE YELLOW 12/06/2016 1847   APPEARANCEUR CLEAR 11/17/2016 1847   LABSPEC 1.013 12/05/2016 1847   PHURINE 5.0 11/13/2016 1847   GLUCOSEU NEGATIVE 12/02/2016 1847   HGBUR NEGATIVE 11/29/2016 1847   BILIRUBINUR NEGATIVE 11/29/2016 1847   KETONESUR NEGATIVE 11/20/2016 1847   PROTEINUR NEGATIVE 11/30/2016 1847   NITRITE NEGATIVE 12/13/2016 1847   LEUKOCYTESUR NEGATIVE 12/05/2016 1847   Recent Results (from the past 240 hour(s))  Culture, blood (Routine x 2)     Status: Abnormal    Collection Time: 11/13/2016  6:37 PM  Result Value Ref Range Status   Specimen Description BLOOD RIGHT ANTECUBITAL  Final   Special Requests   Final    BOTTLES DRAWN AEROBIC AND ANAEROBIC Blood Culture adequate volume   Culture  Setup Time   Final    GRAM POSITIVE COCCI IN BOTH AEROBIC AND ANAEROBIC BOTTLES CRITICAL RESULT CALLED TO, READ BACK BY AND VERIFIED WITH: M. Lear Ng Pharm.D. 10:55 12/05/16 ([email or web address]) Performed at [facility], [place] [address] [place], [place] [address]    Culture STAPHYLOCOCCUS AUREUS (A)  Final   Report Status 12/07/2016 FINAL  Final   Organism ID, Bacteria STAPHYLOCOCCUS AUREUS  Final      Susceptibility   Staphylococcus aureus - MIC*    CIPROFLOXACIN <=0.5 SENSITIVE Sensitive     ERYTHROMYCIN <=0.25 SENSITIVE Sensitive     GENTAMICIN <=0.5 SENSITIVE Sensitive     OXACILLIN 0.5 SENSITIVE Sensitive     TETRACYCLINE <=1 SENSITIVE Sensitive     VANCOMYCIN 1 SENSITIVE Sensitive     TRIMETH/SULFA <=10 SENSITIVE Sensitive     CLINDAMYCIN <=0.25 SENSITIVE Sensitive     RIFAMPIN <=0.5 SENSITIVE Sensitive     Inducible Clindamycin NEGATIVE Sensitive     * STAPHYLOCOCCUS AUREUS  Blood Culture ID Panel (Reflexed)     Status: Abnormal   Collection Time: 11/29/2016  6:37 PM  Result Value Ref Range Status   Enterococcus species NOT DETECTED NOT DETECTED Final   Listeria monocytogenes NOT DETECTED NOT DETECTED Final   Staphylococcus species DETECTED (A) NOT DETECTED Final    Comment: CRITICAL RESULT CALLED TO, READ BACK BY AND VERIFIED WITH: M. Chanetta Marshall.D. 10:55 12/05/16 ([email or web address])    Staphylococcus aureus DETECTED (A) NOT DETECTED Final    Comment: Methicillin (oxacillin) susceptible Staphylococcus aureus (MSSA). Preferred therapy is anti staphylococcal beta lactam antibiotic (Cefazolin or Nafcillin), unless clinically contraindicated. CRITICAL RESULT CALLED TO, READ BACK BY AND VERIFIED WITH: M. Lear Ng Pharm.D. 10:55 12/05/16 ([email or web address])    Methicillin resistance  NOT DETECTED NOT DETECTED Final   Streptococcus species NOT DETECTED NOT DETECTED Final   Streptococcus agalactiae NOT DETECTED NOT DETECTED Final   Streptococcus pneumoniae NOT DETECTED NOT DETECTED Final   Streptococcus pyogenes NOT DETECTED NOT DETECTED Final   Acinetobacter baumannii NOT DETECTED NOT DETECTED Final   Enterobacteriaceae  species NOT DETECTED NOT DETECTED Final   Enterobacter cloacae complex NOT DETECTED NOT DETECTED Final   Escherichia coli NOT DETECTED NOT DETECTED Final   Klebsiella oxytoca NOT DETECTED NOT DETECTED Final   Klebsiella pneumoniae NOT DETECTED NOT DETECTED Final   Proteus species NOT DETECTED NOT DETECTED Final   Serratia marcescens NOT DETECTED NOT DETECTED Final   Haemophilus influenzae NOT DETECTED NOT DETECTED Final   Neisseria meningitidis NOT DETECTED NOT DETECTED Final   Pseudomonas aeruginosa NOT DETECTED NOT DETECTED Final   Candida albicans NOT DETECTED NOT DETECTED Final   Candida glabrata NOT DETECTED NOT DETECTED Final   Candida krusei NOT DETECTED NOT DETECTED Final   Candida parapsilosis NOT DETECTED NOT DETECTED Final   Candida tropicalis NOT DETECTED NOT DETECTED Final    Comment: Performed at [facility], [place] [address] [place], [place] [address]  Culture, blood (Routine x 2)     Status: Abnormal   Collection Time: 12/01/2016  6:40 PM  Result Value Ref Range Status   Specimen Description BLOOD RIGHT FOREARM  Final   Special Requests   Final    BOTTLES DRAWN AEROBIC AND ANAEROBIC Blood Culture adequate volume   Culture  Setup Time   Final    GRAM POSITIVE COCCI IN BOTH AEROBIC AND ANAEROBIC BOTTLES CRITICAL VALUE NOTED.  VALUE IS CONSISTENT WITH PREVIOUSLY REPORTED AND CALLED VALUE.    Culture (A)  Final    STAPHYLOCOCCUS AUREUS SUSCEPTIBILITIES PERFORMED ON PREVIOUS CULTURE WITHIN THE LAST 5 DAYS. Performed at [facility], [place] [address] [place], [place] [address]    Report Status 12/07/2016 FINAL  Final   Culture, sputum-assessment     Status: None   Collection Time: 11/21/2016 11:40 PM  Result Value Ref Range Status   Specimen Description SPUTUM  Final   Special Requests Immunocompromised  Final   Sputum evaluation THIS SPECIMEN IS ACCEPTABLE FOR SPUTUM CULTURE  Final   Report Status 12/05/2016 FINAL  Final  Culture, respiratory (NON-Expectorated)     Status: None   Collection Time: 12/10/2016 11:40 PM  Result Value Ref Range Status   Specimen Description SPUTUM  Final   Special Requests Immunocompromised Reflexed from [id number]  Final   Gram Stain   Final    RARE WBC PRESENT,BOTH PMN AND MONONUCLEAR ABUNDANT SQUAMOUS EPITHELIAL CELLS PRESENT MODERATE GRAM POSITIVE COCCI MODERATE GRAM POSITIVE RODS MODERATE GRAM NEGATIVE RODS    Culture   Final    MODERATE Consistent with normal respiratory flora. Performed at [facility], [place] [address], [place], [place] [address]    Report Status 12/08/2016 FINAL  Final  Rapid strep screen     Status: None   Collection Time: 12/05/16  4:04 PM  Result Value Ref Range Status   Streptococcus, Group A Screen (Direct) NEGATIVE NEGATIVE Final    Comment: (NOTE) A Rapid Antigen test may result negative if the antigen level in the sample is below the detection level of this test. The FDA has not cleared this test as a stand-alone test therefore the rapid antigen negative result has reflexed to a Group A Strep culture.   Culture, group A strep     Status: None   Collection Time: 12/05/16  4:04 PM  Result Value Ref Range Status   Specimen Description THROAT  Final   Special Requests NONE Reflexed from [id number]  Final   Culture   Final    NO GROUP A STREP (S.PYOGENES) ISOLATED Performed at [facility] [place]  [facility], [address], [place], [place] [address]    Report Status 12/08/2016 FINAL  Final  MRSA PCR Screening     Status: None   Collection Time: 12/05/16  8:20 PM  Result Value Ref Range Status   MRSA by PCR NEGATIVE NEGATIVE Final    Comment:         The GeneXpert MRSA Assay (FDA approved for NASAL specimens only), is one component of a comprehensive MRSA colonization surveillance program. It is not intended to diagnose MRSA infection nor to guide or monitor treatment for MRSA infections.   Culture, blood (Routine X 2) w Reflex to ID Panel     Status: None (Preliminary result)   Collection Time: 12/06/16  4:05 AM  Result Value Ref Range Status   Specimen Description BLOOD LEFT HAND  Final   Special Requests   Final    BOTTLES DRAWN AEROBIC AND ANAEROBIC Blood Culture adequate volume   Culture   Final    NO GROWTH 3 DAYS Performed at [facility], [address] [place], [place] [address]    Report Status PENDING  Incomplete  Aerobic/Anaerobic Culture (surgical/deep wound)     Status: None (Preliminary result)   Collection Time: 12/06/16 12:41 PM  Result Value Ref Range Status   Specimen Description WOUND RIGHT CHEST  Final   Special Requests Normal  Final   Gram Stain   Final    ABUNDANT WBC PRESENT, PREDOMINANTLY PMN ABUNDANT GRAM POSITIVE COCCI IN PAIRS Performed at [facility], [place] [address] [place], [place] [address]    Culture   Final    ABUNDANT STAPHYLOCOCCUS AUREUS NO ANAEROBES ISOLATED; CULTURE IN PROGRESS FOR 5 DAYS    Report Status PENDING  Incomplete   Organism ID, Bacteria STAPHYLOCOCCUS AUREUS  Final      Susceptibility   Staphylococcus aureus - MIC*    CIPROFLOXACIN <=0.5 SENSITIVE Sensitive     ERYTHROMYCIN <=0.25 SENSITIVE Sensitive     GENTAMICIN <=0.5 SENSITIVE Sensitive     OXACILLIN 0.5 SENSITIVE Sensitive     TETRACYCLINE <=1 SENSITIVE Sensitive     VANCOMYCIN <=0.5 SENSITIVE Sensitive     TRIMETH/SULFA <=10 SENSITIVE Sensitive     CLINDAMYCIN <=0.25 SENSITIVE Sensitive     RIFAMPIN <=0.5 SENSITIVE Sensitive     Inducible Clindamycin NEGATIVE Sensitive     * ABUNDANT STAPHYLOCOCCUS AUREUS  Body fluid culture     Status: None (Preliminary result)   Collection Time:  12/07/16 10:31 AM  Result Value Ref Range Status   Specimen Description PLEURAL RIGHT  Final   Special Requests Immunocompromised  Final   Gram Stain   Final    ABUNDANT WBC PRESENT,BOTH PMN AND MONONUCLEAR NO ORGANISMS SEEN    Culture   Final    NO GROWTH 2 DAYS Performed at [facility], [address] [place],  [address]    Report Status PENDING  Incomplete      Radiology Studies: Dg Chest Port 1 View  Result Date: 12/09/2016 CLINICAL DATA:  Pleural effusion. EXAM: PORTABLE CHEST 1 VIEW COMPARISON:  Chest x-ray dated December 07, 2016. FINDINGS: The cardiomediastinal silhouette is normal in size. Normal pulmonary vascularity. Interval increase in size of small right pleural effusion. Adjacent right basilar atelectasis. The left lung is clear. No pneumothorax. No acute osseous abnormality. IMPRESSION: Increase in size of small right pleural effusion. Electronically Signed   By: Titus Dubin M.D.   On: 12/09/2016 07:25   Dg Chest [facility]  1 View  Result Date: 12/07/2016 CLINICAL DATA:  Status post right-sided thoracentesis EXAM: PORTABLE CHEST 1 VIEW COMPARISON:  Chest radiograph and chest CT December 06, 2016 FINDINGS: Right pleural effusion is smaller following thoracentesis. No pneumothorax. There is a small pleural effusion on each side currently. There is atelectatic change in the lung bases. Heart is upper normal in size with pulmonary vascularity within normal limits. There is aortic atherosclerosis. No adenopathy. No evident bone lesions. IMPRESSION: No pneumothorax. Small pleural effusions bilaterally with bibasilar atelectasis. Heart is upper normal in size. No adenopathy evident. There is aortic atherosclerosis. Aortic Atherosclerosis (ICD10-I70.0). Electronically Signed   By: Lowella Grip III M.D.   On: 12/07/2016 10:54    Scheduled Meds: . chlorhexidine  15 mL Mouth Rinse BID  . enoxaparin (LOVENOX) injection  40 mg Subcutaneous Q24H  . feeding supplement  (OSMOLITE 1.5 CAL)  237 mL Per Tube 6 X Daily  . free water  100 mL Per Tube Q4H  . gabapentin  300 mg Per Tube TID  . mouth rinse  15 mL Mouth Rinse q12n4p  . pantoprazole (PROTONIX) IV  40 mg Intravenous Q12H  . senna-docusate  1 tablet Oral BID   Continuous Infusions: . sodium chloride    .  ceFAZolin (ANCEF) IV 2 g (12/09/16 0926)  . lactated ringers 100 mL/hr at 12/09/16 0747     LOS: 5 days   Signature  Lala Lund M.D on 12/09/2016 at 10:39 AM  Between 7am to 7pm - Pager - [phone] ( page via [email or web address], text pages only, please mention full 10 digit call back number).  After 7pm go to [email or web address] - password [email or web address]      12/09/2016, 10:39 AM

## 2016-12-10 LAB — MAGNESIUM: MAGNESIUM: 1.7 mg/dL (ref 1.7–2.4)

## 2016-12-10 LAB — BASIC METABOLIC PANEL
ANION GAP: 10 (ref 5–15)
BUN: 11 mg/dL (ref 6–20)
CHLORIDE: 92 mmol/L — AB (ref 101–111)
CO2: 35 mmol/L — ABNORMAL HIGH (ref 22–32)
Calcium: 9 mg/dL (ref 8.9–10.3)
Creatinine, Ser: 0.63 mg/dL (ref 0.61–1.24)
Glucose, Bld: 105 mg/dL — ABNORMAL HIGH (ref 65–99)
POTASSIUM: 3.5 mmol/L (ref 3.5–5.1)
Sodium: 137 mmol/L (ref 135–145)

## 2016-12-10 LAB — CBC
HEMATOCRIT: 24.9 % — AB (ref 39.0–52.0)
Hemoglobin: 8.4 g/dL — ABNORMAL LOW (ref 13.0–17.0)
MCH: 26.8 pg (ref 26.0–34.0)
MCHC: 33.7 g/dL (ref 30.0–36.0)
MCV: 79.6 fL (ref 78.0–100.0)
Platelets: 259 10*3/uL (ref 150–400)
RBC: 3.13 MIL/uL — AB (ref 4.22–5.81)
RDW: 16.7 % — AB (ref 11.5–15.5)
WBC: 11 10*3/uL — AB (ref 4.0–10.5)

## 2016-12-10 LAB — GLUCOSE, CAPILLARY
GLUCOSE-CAPILLARY: 141 mg/dL — AB (ref 65–99)
GLUCOSE-CAPILLARY: 134 mg/dL — AB (ref 65–99)
GLUCOSE-CAPILLARY: 148 mg/dL — AB (ref 65–99)
Glucose-Capillary: 146 mg/dL — ABNORMAL HIGH (ref 65–99)
Glucose-Capillary: 158 mg/dL — ABNORMAL HIGH (ref 65–99)

## 2016-12-10 LAB — BODY FLUID CULTURE: Culture: NO GROWTH

## 2016-12-10 LAB — PH, BODY FLUID: pH, Body Fluid: 7.7

## 2016-12-10 LAB — PROCALCITONIN: PROCALCITONIN: 1.48 ng/mL

## 2016-12-10 MED ORDER — LACTATED RINGERS IV SOLN
INTRAVENOUS | Status: AC
  Administered 2016-12-10: 10:00:00 via INTRAVENOUS

## 2016-12-10 MED ORDER — METOPROLOL TARTRATE 5 MG/5ML IV SOLN
INTRAVENOUS | Status: AC
  Administered 2016-12-10: 10 mg via INTRAVENOUS
  Filled 2016-12-10: qty 10

## 2016-12-10 MED ORDER — MAGNESIUM SULFATE IN D5W 1-5 GM/100ML-% IV SOLN
1.0000 g | Freq: Once | INTRAVENOUS | Status: AC
  Administered 2016-12-10: 1 g via INTRAVENOUS
  Filled 2016-12-10: qty 100

## 2016-12-10 MED ORDER — MUSCLE RUB 10-15 % EX CREA
TOPICAL_CREAM | CUTANEOUS | Status: DC | PRN
  Administered 2016-12-10 – 2016-12-18 (×4): via TOPICAL
  Filled 2016-12-10 (×2): qty 85

## 2016-12-10 MED ORDER — POTASSIUM CHLORIDE 20 MEQ/15ML (10%) PO SOLN
40.0000 meq | Freq: Two times a day (BID) | ORAL | Status: AC
  Administered 2016-12-10 (×2): 40 meq via ORAL
  Filled 2016-12-10 (×2): qty 30

## 2016-12-10 MED ORDER — METOPROLOL TARTRATE 5 MG/5ML IV SOLN
10.0000 mg | Freq: Once | INTRAVENOUS | Status: AC | PRN
Start: 2016-12-10 — End: 2016-12-10
  Administered 2016-12-10: 10 mg via INTRAVENOUS

## 2016-12-10 NOTE — Progress Notes (Signed)
PROGRESS NOTE  Antonio [name]  OZD:[id number] DOB: [birth year] DOA: 11/25/2016 PCP: Maryellen Pile, MD  Outpatient Specialists: Oncology, [name]   Brief Narrative:  Antonio [name] is a 61 y.o. male with a history of supraglottic laryngeal CA s/p resection, chemo/radiation currently on palliative chemotherapy who was brought from home by EMS due to weakness, a fall, and confusion which was reported by his sister. He reports he slipped and fell on urine and denies any confusion. He reported chills and a productive cough to admitting MD. He denied fevers but had a temperature of 103.51F, heart rates 134-141, respirations 18-32, blood pressures 117/57-122/66, O2 saturations 91-96% on room air. Labs revealed WBC 3.7, hemoglobin 8.2, MCV 76.8, RDW 16.4, platelets 184, BUN 23, creatinine 1, albumin 2.5, lactic acid trending upwards to 1.92. Chest x-ray showed right base collapse with small pleural effusion. Urinalysis was negative for any signs of infection. He was given 3L of normal saline, empiric antibiotics and admitted. Blood cultures became positive for MSSA, antibiotics narrowed to ancef per ID recommendations. Pt became hypotensive and was transferred to the stepdown unit with CCM consult, though vitals have stabilized with fluids alone. Port was removed with purulent pocket noted on 10/24 and thoracentesis was performed 10/25.    Subjective:  -Patient in bed, denies any chest or abdominal pain, no headache fever or chills, no focal weakness, no abdominal pain no nausea.  He is awaiting to discuss his care with [name] his oncologist.   Assessment & Plan:  Sepsis due to MSSA bacteremia due to port infection  - in a immunocompromised patient, port which was infected removed on 12/06/2016, he was negative for hepatitis or HIV.  ID following and on board.  Currently on Ancef since 12/05/2016.  Negative transthoracic echogram for any vegetations, TEE cannot be done due to laryngeal cancer.   Further management per ID.  Repeat blood culture from 12/06/2016 is negative.  RLL opacity on imaginglong with right-sided pleural effusion.  CT chest done and thoracentesis obtained by pulmonary on 12/07/2016,could have compressive atelectasis versus pneumonia has per CT findings,fluid appears to be consistent with exudate, pulmonary on board managing this issue.  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 [name] oncology and palliative care following, patient wants to discuss further course of care with [name] and then decide on his CODE STATUS and if he is to go palliative care route.  Dysphagia -s/p PEG May 2018. Continue feeds thru PEG tube. Ok to give liquid po for pt comfort.  Pancytopenia -Suspect due to marrow suppression.  Required 1 unit of packed RBC transfusion on 12/05/2016, stable H&H since then.   Fall with generalized weakness -due to weakness and deconditioning, supportive care, PT.  History of hypercalcemia of malignancy - Admitted earlier Oct 2018.  Resolved, will monitor.   Peripheral neuropathy - Continue gabapentin    DVT prophylaxis: SCDs Code Status: Full Family Communication: None at bedside this AM Disposition Plan: continue in SDU today.   Consultants:   Oncology, [name].  ID, Dr. Johnnye Sima  IR, Dr. Anselm Pancoast  CCM, Dr. Ashok Cordia   Palliative care team  Procedures:   Echocardiogram 12/06/2016: - Left ventricle: The cavity size was normal. Wall thickness was   normal. Systolic function was normal. The estimated ejection   fraction was in the range of 55% to 60%. Wall motion was normal;   there were no regional wall motion abnormalities. Doppler   parameters are consistent with abnormal left ventricular  relaxation (grade 1 diastolic dysfunction). - Mitral valve: Calcified annulus.  Impressions: - Normal LV systolic function; mild diastolic  dysfunction.  Antimicrobials:  Vancomycin 10/22 > 10/23  Zosyn 10/22  Cefepime 10/22 > 10/23   Ancef 10/23 >>     Objective: Vitals:   12/10/16 0100 12/10/16 0200 12/10/16 0305 12/10/16 0700  BP:  140/66    Pulse:      Resp: 20 (!) 27    Temp:   99 F (37.2 C) 98.4 F (36.9 C)  TempSrc:   Oral Oral  SpO2: 97% 99%    Weight:   57.1 kg (125 lb 14.1 oz)   Height:        Intake/Output Summary (Last 24 hours) at 12/10/16 0929 Last data filed at 12/10/16 0235  Gross per 24 hour  Intake             2672 ml  Output             1325 ml  Net             1347 ml   Filed Weights   12/07/16 0337 12/09/16 0400 12/10/16 0305  Weight: 58.7 kg (129 lb 6.6 oz) 58.8 kg (129 lb 10.1 oz) 57.1 kg (125 lb 14.1 oz)    Exam  Awake Alert, No new F.N deficits, Normal affect [place].AT,PERRAL Supple Neck, No JVD, No cervical lymphadenopathy appriciated.  Symmetrical Chest wall movement, Good air movement bilaterally, CTAB RRR,No Gallops,Rubs or new Murmurs, No Parasternal Heave +ve B.Sounds, Abd Soft, No tenderness, No organomegaly appriciated, No rebound - guarding or rigidity. PEG tube is in place site appears clean No Cyanosis, Clubbing or edema, No new Rash or bruise    Data Reviewed: I have personally reviewed following labs and imaging studies  CBC:  Recent Labs Lab 11/23/2016 1837  12/06/16 0405 12/07/16 0327 12/08/16 0321 12/09/16 0316 12/10/16 0328  WBC 3.7*  < > 2.6* 3.1* 5.2 7.3 11.0*  NEUTROABS 3.2  --  2.1  --   --   --   --   HGB 8.2*  < > 8.7* 9.6* 9.5* 8.1* 8.4*  HCT 23.5*  < > 24.8* 27.5* 28.0* 23.6* 24.9*  MCV 76.8*  < > 78.0 78.1 78.9 79.2 79.6  PLT 184  < > 126* 139* 173 197 259  < > = values in this interval not displayed. Basic Metabolic Panel:  Recent Labs Lab 12/05/16 1941 12/05/16 2022 12/06/16 0405 12/07/16 0327 12/08/16 0321 12/09/16 0316 12/10/16 0328  NA 138 137 139 140 140 138 137  K 2.9* 2.8* 4.0 2.9* 3.0* 3.3* 3.5  CL 104 105 110 107  103 97* 92*  CO2 23 23 23 26 29 31  35*  GLUCOSE 157* 168* 110* 113* 108* 121* 105*  BUN 21* 21* 20 15 12 11 11   CREATININE 0.78 0.75 0.69 0.66 0.65 0.60* 0.63  CALCIUM 7.4* 7.1* 7.6* 8.0* 8.5* 8.9 9.0  MG 2.0 2.0  --   --   --   --  1.7  PHOS 2.4* 2.5  --   --   --   --   --    GFR: Estimated Creatinine Clearance: 79 mL/min (by C-G formula based on SCr of 0.63 mg/dL). Liver Function Tests:  Recent Labs Lab 11/27/2016 1837 12/06/16 0405 12/07/16 1104  AST 38 53*  --   ALT 57 52  --   ALKPHOS 91 90  --   BILITOT 1.3* 0.9  --   PROT 6.3*  5.2* 5.1*  ALBUMIN 2.5* 2.0*  --    No results for input(s): LIPASE, AMYLASE in the last 168 hours. No results for input(s): AMMONIA in the last 168 hours. Coagulation Profile:  Recent Labs Lab 11/16/2016 1837  INR 1.29   Cardiac Enzymes:  Recent Labs Lab 11/16/2016 1924 12/05/16 0037 12/05/16 0244  TROPONINI 0.03* 0.03* <0.03   BNP (last 3 results) No results for input(s): PROBNP in the last 8760 hours. HbA1C: No results for input(s): HGBA1C in the last 72 hours. CBG:  Recent Labs Lab 12/09/16 0813 12/09/16 1154 12/09/16 1552 12/09/16 2103 12/10/16 0733  GLUCAP 160* 140* 133* 141* 146*   Lipid Profile: No results for input(s): CHOL, HDL, LDLCALC, TRIG, CHOLHDL, LDLDIRECT in the last 72 hours. Thyroid Function Tests: No results for input(s): TSH, T4TOTAL, FREET4, T3FREE, THYROIDAB in the last 72 hours. Anemia Panel:  Recent Labs  12/09/16 0807  VITAMINB12 1,044*  FOLATE 19.5  FERRITIN 1,024*  TIBC NOT CALCULATED  IRON 12*  RETICCTPCT 0.5   Urine analysis:    Component Value Date/Time   COLORURINE YELLOW 11/24/2016 1847   APPEARANCEUR CLEAR 11/25/2016 1847   LABSPEC 1.013 11/22/2016 1847   PHURINE 5.0 12/07/2016 1847   GLUCOSEU NEGATIVE 11/29/2016 1847   HGBUR NEGATIVE 11/22/2016 1847   BILIRUBINUR NEGATIVE 11/16/2016 1847   KETONESUR NEGATIVE 11/18/2016 1847   PROTEINUR NEGATIVE 11/30/2016 1847   NITRITE  NEGATIVE 11/24/2016 1847   LEUKOCYTESUR NEGATIVE 12/11/2016 1847   Recent Results (from the past 240 hour(s))  Culture, blood (Routine x 2)     Status: Abnormal   Collection Time: 12/11/2016  6:37 PM  Result Value Ref Range Status   Specimen Description BLOOD RIGHT ANTECUBITAL  Final   Special Requests   Final    BOTTLES DRAWN AEROBIC AND ANAEROBIC Blood Culture adequate volume   Culture  Setup Time   Final    GRAM POSITIVE COCCI IN BOTH AEROBIC AND ANAEROBIC BOTTLES CRITICAL RESULT CALLED TO, READ BACK BY AND VERIFIED WITH: M. Lear Ng Pharm.D. 10:55 12/05/16 ([email or web address]) Performed at [facility], [place] [address], [place], [place] [address]    Culture STAPHYLOCOCCUS AUREUS (A)  Final   Report Status 12/07/2016 FINAL  Final   Organism ID, Bacteria STAPHYLOCOCCUS AUREUS  Final      Susceptibility   Staphylococcus aureus - MIC*    CIPROFLOXACIN <=0.5 SENSITIVE Sensitive     ERYTHROMYCIN <=0.25 SENSITIVE Sensitive     GENTAMICIN <=0.5 SENSITIVE Sensitive     OXACILLIN 0.5 SENSITIVE Sensitive     TETRACYCLINE <=1 SENSITIVE Sensitive     VANCOMYCIN 1 SENSITIVE Sensitive     TRIMETH/SULFA <=10 SENSITIVE Sensitive     CLINDAMYCIN <=0.25 SENSITIVE Sensitive     RIFAMPIN <=0.5 SENSITIVE Sensitive     Inducible Clindamycin NEGATIVE Sensitive     * STAPHYLOCOCCUS AUREUS  Blood Culture ID Panel (Reflexed)     Status: Abnormal   Collection Time: 12/07/2016  6:37 PM  Result Value Ref Range Status   Enterococcus species NOT DETECTED NOT DETECTED Final   Listeria monocytogenes NOT DETECTED NOT DETECTED Final   Staphylococcus species DETECTED (A) NOT DETECTED Final    Comment: CRITICAL RESULT CALLED TO, READ BACK BY AND VERIFIED WITH: M. Chanetta Marshall.D. 10:55 12/05/16 ([email or web address])    Staphylococcus aureus DETECTED (A) NOT DETECTED Final    Comment: Methicillin (oxacillin) susceptible Staphylococcus aureus (MSSA). Preferred therapy is anti staphylococcal beta lactam antibiotic (Cefazolin or Nafcillin),  unless clinically contraindicated. CRITICAL RESULT CALLED TO,  READ BACK BY AND VERIFIED WITH: M. Lear Ng Pharm.D. 10:55 12/05/16 ([email or web address])    Methicillin resistance NOT DETECTED NOT DETECTED Final   Streptococcus species NOT DETECTED NOT DETECTED Final   Streptococcus agalactiae NOT DETECTED NOT DETECTED Final   Streptococcus pneumoniae NOT DETECTED NOT DETECTED Final   Streptococcus pyogenes NOT DETECTED NOT DETECTED Final   Acinetobacter baumannii NOT DETECTED NOT DETECTED Final   Enterobacteriaceae species NOT DETECTED NOT DETECTED Final   Enterobacter cloacae complex NOT DETECTED NOT DETECTED Final   Escherichia coli NOT DETECTED NOT DETECTED Final   Klebsiella oxytoca NOT DETECTED NOT DETECTED Final   Klebsiella pneumoniae NOT DETECTED NOT DETECTED Final   Proteus species NOT DETECTED NOT DETECTED Final   Serratia marcescens NOT DETECTED NOT DETECTED Final   Haemophilus influenzae NOT DETECTED NOT DETECTED Final   Neisseria meningitidis NOT DETECTED NOT DETECTED Final   Pseudomonas aeruginosa NOT DETECTED NOT DETECTED Final   Candida albicans NOT DETECTED NOT DETECTED Final   Candida glabrata NOT DETECTED NOT DETECTED Final   Candida krusei NOT DETECTED NOT DETECTED Final   Candida parapsilosis NOT DETECTED NOT DETECTED Final   Candida tropicalis NOT DETECTED NOT DETECTED Final    Comment: Performed at [facility], [address], [place], [place] [address]  Culture, blood (Routine x 2)     Status: Abnormal   Collection Time: 12/11/2016  6:40 PM  Result Value Ref Range Status   Specimen Description BLOOD RIGHT FOREARM  Final   Special Requests   Final    BOTTLES DRAWN AEROBIC AND ANAEROBIC Blood Culture adequate volume   Culture  Setup Time   Final    GRAM POSITIVE COCCI IN BOTH AEROBIC AND ANAEROBIC BOTTLES CRITICAL VALUE NOTED.  VALUE IS CONSISTENT WITH PREVIOUSLY REPORTED AND CALLED VALUE.    Culture (A)  Final    STAPHYLOCOCCUS AUREUS SUSCEPTIBILITIES PERFORMED ON  PREVIOUS CULTURE WITHIN THE LAST 5 DAYS. Performed at [facility], [place] [address], [place], [place] [address]    Report Status 12/07/2016 FINAL  Final  Culture, sputum-assessment     Status: None   Collection Time: 11/23/2016 11:40 PM  Result Value Ref Range Status   Specimen Description SPUTUM  Final   Special Requests Immunocompromised  Final   Sputum evaluation THIS SPECIMEN IS ACCEPTABLE FOR SPUTUM CULTURE  Final   Report Status 12/05/2016 FINAL  Final  Culture, respiratory (NON-Expectorated)     Status: None   Collection Time: 11/22/2016 11:40 PM  Result Value Ref Range Status   Specimen Description SPUTUM  Final   Special Requests Immunocompromised Reflexed from [id number]  Final   Gram Stain   Final    RARE WBC PRESENT,BOTH PMN AND MONONUCLEAR ABUNDANT SQUAMOUS EPITHELIAL CELLS PRESENT MODERATE GRAM POSITIVE COCCI MODERATE GRAM POSITIVE RODS MODERATE GRAM NEGATIVE RODS    Culture   Final    MODERATE Consistent with normal respiratory flora. Performed at [facility], [place] [address] [place], [place] [address]    Report Status 12/08/2016 FINAL  Final  Rapid strep screen     Status: None   Collection Time: 12/05/16  4:04 PM  Result Value Ref Range Status   Streptococcus, Group A Screen (Direct) NEGATIVE NEGATIVE Final    Comment: (NOTE) A Rapid Antigen test may result negative if the antigen level in the sample is below the detection level of this test. The FDA has not cleared this test as a stand-alone test therefore the rapid antigen negative result has  reflexed to a Group A Strep culture.   Culture, group A strep     Status: None   Collection Time: 12/05/16  4:04 PM  Result Value Ref Range Status   Specimen Description THROAT  Final   Special Requests NONE Reflexed from [id number]  Final   Culture   Final    NO GROUP A STREP (S.PYOGENES) ISOLATED Performed at [facility], [address], [place] [employer] [place] [employer], [place] [address]    Report Status 12/08/2016 FINAL  Final   MRSA PCR Screening     Status: None   Collection Time: 12/05/16  8:20 PM  Result Value Ref Range Status   MRSA by PCR NEGATIVE NEGATIVE Final    Comment:        The GeneXpert MRSA Assay (FDA approved for NASAL specimens only), is one component of a comprehensive MRSA colonization surveillance program. It is not intended to diagnose MRSA infection nor to guide or monitor treatment for MRSA infections.   Culture, blood (Routine X 2) w Reflex to ID Panel     Status: None (Preliminary result)   Collection Time: 12/06/16  4:05 AM  Result Value Ref Range Status   Specimen Description BLOOD LEFT HAND  Final   Special Requests   Final    BOTTLES DRAWN AEROBIC AND ANAEROBIC Blood Culture adequate volume   Culture   Final    NO GROWTH 3 DAYS Performed at [facility], [address], [place], [place] [address]    Report Status PENDING  Incomplete  Aerobic/Anaerobic Culture (surgical/deep wound)     Status: None (Preliminary result)   Collection Time: 12/06/16 12:41 PM  Result Value Ref Range Status   Specimen Description WOUND RIGHT CHEST  Final   Special Requests Normal  Final   Gram Stain   Final    ABUNDANT WBC PRESENT, PREDOMINANTLY PMN ABUNDANT GRAM POSITIVE COCCI IN PAIRS Performed at [facility], [place] [address] [place], [place] [address]    Culture   Final    ABUNDANT STAPHYLOCOCCUS AUREUS NO ANAEROBES ISOLATED; CULTURE IN PROGRESS FOR 5 DAYS    Report Status PENDING  Incomplete   Organism ID, Bacteria STAPHYLOCOCCUS AUREUS  Final      Susceptibility   Staphylococcus aureus - MIC*    CIPROFLOXACIN <=0.5 SENSITIVE Sensitive     ERYTHROMYCIN <=0.25 SENSITIVE Sensitive     GENTAMICIN <=0.5 SENSITIVE Sensitive     OXACILLIN 0.5 SENSITIVE Sensitive     TETRACYCLINE <=1 SENSITIVE Sensitive     VANCOMYCIN <=0.5 SENSITIVE Sensitive     TRIMETH/SULFA <=10 SENSITIVE Sensitive     CLINDAMYCIN <=0.25 SENSITIVE Sensitive     RIFAMPIN <=0.5 SENSITIVE Sensitive      Inducible Clindamycin NEGATIVE Sensitive     * ABUNDANT STAPHYLOCOCCUS AUREUS  Body fluid culture     Status: None (Preliminary result)   Collection Time: 12/07/16 10:31 AM  Result Value Ref Range Status   Specimen Description PLEURAL RIGHT  Final   Special Requests Immunocompromised  Final   Gram Stain   Final    ABUNDANT WBC PRESENT,BOTH PMN AND MONONUCLEAR NO ORGANISMS SEEN    Culture   Final    NO GROWTH 2 DAYS Performed at [facility], [address] [place],  [address]    Report Status PENDING  Incomplete      Radiology Studies: Dg Chest Port 1 View  Result Date: 12/09/2016 CLINICAL DATA:  Pleural effusion. EXAM: PORTABLE CHEST 1 VIEW COMPARISON:  Chest x-ray dated December 07, 2016. FINDINGS: The cardiomediastinal silhouette is normal in size. Normal pulmonary vascularity. Interval increase in size of small right pleural effusion. Adjacent right basilar atelectasis. The left lung is clear. No pneumothorax. No acute osseous abnormality. IMPRESSION: Increase in size of small right pleural effusion. Electronically Signed   By: Titus Dubin M.D.   On: 12/09/2016 07:25    Scheduled Meds: . chlorhexidine  15 mL Mouth Rinse BID  . enoxaparin (LOVENOX) injection  40 mg Subcutaneous Q24H  . feeding supplement (OSMOLITE 1.5 CAL)  237 mL Per Tube 6 X Daily  . free water  100 mL Per Tube Q4H  . gabapentin  300 mg Per Tube TID  . mouth rinse  15 mL Mouth Rinse q12n4p  . pantoprazole (PROTONIX) IV  40 mg Intravenous Q12H  . potassium chloride  40 mEq Oral BID  . senna-docusate  1 tablet Oral BID   Continuous Infusions: . sodium chloride    .  ceFAZolin (ANCEF) IV Stopped (12/10/16 0305)  . magnesium sulfate 1 - 4 g bolus IVPB Stopped (12/10/16 0946)     LOS: 6 days   Signature  Lala Lund M.D on 12/10/2016 at 9:29 AM  Between 7am to 7pm - Pager - [phone] ( page via [email or web address], text pages only, please mention full 10 digit call back number).  After 7pm  go to [email or web address] - password [facility]

## 2016-12-11 ENCOUNTER — Inpatient Hospital Stay (HOSPITAL_COMMUNITY): Payer: Medicare Other

## 2016-12-11 DIAGNOSIS — R000 Tachycardia, unspecified: Secondary | ICD-10-CM

## 2016-12-11 DIAGNOSIS — I959 Hypotension, unspecified: Secondary | ICD-10-CM

## 2016-12-11 DIAGNOSIS — R7881 Bacteremia: Secondary | ICD-10-CM

## 2016-12-11 DIAGNOSIS — T80211D Bloodstream infection due to central venous catheter, subsequent encounter: Secondary | ICD-10-CM

## 2016-12-11 LAB — BASIC METABOLIC PANEL
Anion gap: 8 (ref 5–15)
BUN: 11 mg/dL (ref 6–20)
CHLORIDE: 95 mmol/L — AB (ref 101–111)
CO2: 35 mmol/L — AB (ref 22–32)
CREATININE: 0.61 mg/dL (ref 0.61–1.24)
Calcium: 9.2 mg/dL (ref 8.9–10.3)
GFR calc non Af Amer: >60 mL/min (ref >60)
Glucose, Bld: 100 mg/dL — ABNORMAL HIGH (ref 65–99)
Potassium: 4.5 mmol/L (ref 3.5–5.1)
Sodium: 138 mmol/L (ref 135–145)

## 2016-12-11 LAB — MAGNESIUM: Magnesium: 1.9 mg/dL (ref 1.7–2.4)

## 2016-12-11 LAB — GLUCOSE, CAPILLARY
GLUCOSE-CAPILLARY: 125 mg/dL — AB (ref 65–99)
GLUCOSE-CAPILLARY: 154 mg/dL — AB (ref 65–99)
GLUCOSE-CAPILLARY: 94 mg/dL (ref 65–99)
Glucose-Capillary: 128 mg/dL — ABNORMAL HIGH (ref 65–99)

## 2016-12-11 LAB — CBC
HEMATOCRIT: 26.7 % — AB (ref 39.0–52.0)
HEMOGLOBIN: 8.8 g/dL — AB (ref 13.0–17.0)
MCH: 26.8 pg (ref 26.0–34.0)
MCHC: 33 g/dL (ref 30.0–36.0)
MCV: 81.4 fL (ref 78.0–100.0)
Platelets: 279 10*3/uL (ref 150–400)
RBC: 3.28 MIL/uL — ABNORMAL LOW (ref 4.22–5.81)
RDW: 16.8 % — ABNORMAL HIGH (ref 11.5–15.5)
WBC: 14 10*3/uL — ABNORMAL HIGH (ref 4.0–10.5)

## 2016-12-11 LAB — CULTURE, BLOOD (ROUTINE X 2)
Culture: NO GROWTH
SPECIAL REQUESTS: ADEQUATE

## 2016-12-11 LAB — AEROBIC/ANAEROBIC CULTURE W GRAM STAIN (SURGICAL/DEEP WOUND)

## 2016-12-11 LAB — PROCALCITONIN: PROCALCITONIN: 0.92 ng/mL

## 2016-12-11 LAB — AEROBIC/ANAEROBIC CULTURE (SURGICAL/DEEP WOUND): SPECIAL REQUESTS: NORMAL

## 2016-12-11 LAB — TSH: TSH: 2.265 u[IU]/mL (ref 0.350–4.500)

## 2016-12-11 MED ORDER — METOPROLOL TARTRATE 25 MG PO TABS
25.0000 mg | ORAL_TABLET | Freq: Two times a day (BID) | ORAL | Status: DC
  Administered 2016-12-11 (×2): 25 mg via ORAL
  Filled 2016-12-11 (×2): qty 1

## 2016-12-11 NOTE — Progress Notes (Signed)
Noted events last 5 days I will stop by around 1130 am to discuss goals of care Would be helpful if family can attend

## 2016-12-11 NOTE — Progress Notes (Signed)
   12/11/16 1000  Clinical Encounter Type  Visited With Patient and family together  Visit Type Follow-up;Psychological support;Spiritual support;Critical Care  Referral From Nurse  Consult/Referral To Chaplain  Spiritual Encounters  Spiritual Needs Other (Comment) (Advance Directive )  Stress Factors  Patient Stress Factors Other (Comment) (Advance Directive )  Family Stress Factors Other (Comment)  Advance Directives (For Healthcare)  Does Patient Have a Medical Advance Directive? No  Would patient like information on creating a medical advance directive? Yes (Inpatient - patient requests chaplain consult to create a medical advance directive)   Patient has the Advance Directive naming his sister as POA at home. He is planning on bringing it up to the hospital to be notarized and completed.   Please, contact Spiritual Care for further assistance.  [place] M.Div.

## 2016-12-11 NOTE — Progress Notes (Signed)
Name: Antonio [name] MRN: [id number] DOB: 07-27-1955    ADMISSION DATE:  11/16/2016 CONSULTATION DATE:  12/05/2016  REFERRING MD :  Dr. Bonner Puna  CHIEF COMPLAINT:  Sepsis   HISTORY OF PRESENT ILLNESS: 61 y.o. 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. Status post carboplatin and paclitaxel on 10/5. Completed radiation to the right lung and larynx on 7/5. Known poor prognosis & treatment is strictly palliative in nature. Underlying COPD and dysphagia status post PEG. Presented 10/22 with weight loss, decreased appetite, and multiple falls. Overall generalized pain & throat pain. Picture of sepsis with right sided findings concerning for possible pneumonia. Hypotension with responsiveness to IV fluid bolus. Found to have MSSA bacteremia. ID consulted & patient's antibiotics narrowed to Ancef.   SUBJECTIVE/interval:   Seen by palliative medicine over the weekend.  For goals of care discussion later today no distress,  VITAL SIGNS: Temp:  [98.3 F (36.8 C)-101.5 F (38.6 C)] 99.2 F (37.3 C) (10/29 0800) Resp:  [19-32] 26 (10/29 0600) BP: (102-165)/(37-80) 128/75 (10/29 0600) SpO2:  [76 %-99 %] 76 % (10/29 0600) Weight:  [123 lb 3.8 oz (55.9 kg)] 123 lb 3.8 oz (55.9 kg) (10/29 0600) 3 L via nasal cannula PHYSICAL EXAMINATION: General: 61 year old male patient resting comfortably in bed he is in no distress HEENT: Normocephalic atraumatic mucous membranes are moist Pulmonary: Diffuse rhonchi, no accessory muscle use, right chest dressing is intact he denies pain Cardiac: Regular rate and rhythm without murmur rub or gallop Abdomen: Soft nontender PEG intact Extremities/musculoskeletal: Warm and dry with equal strength and bulk Neuro: Awake oriented without focal deficits he is hard of hearing  Recent Labs Lab 12/09/16 0316 12/10/16 0328 12/11/16 0339  NA 138 137  138  K 3.3* 3.5 4.5  CL 97* 92* 95*  CO2 31 35* 35*  BUN 11 11 11   CREATININE 0.60* 0.63 0.61  GLUCOSE 121* 105* 100*    Recent Labs Lab 12/09/16 0316 12/10/16 0328 12/11/16 0339  HGB 8.1* 8.4* 8.8*  HCT 23.6* 24.9* 26.7*  WBC 7.3 11.0* 14.0*  PLT 197 259 279   No results found. STUDIES:  CERVICAL LYMPH NODE BIOPSY 05/18/16:  Squamous cell carcinoma  PET CT 06/08/16: IMPRESSION: 1. 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 2. Hypermetabolic right lower lobe pulmonary nodule is favored to represent a synchronous primary bronchogenic neoplasm. Isolated pulmonary metastasis could look similar. No thoracic nodal hypermetabolism. 3. Soft tissue hypermetabolism about the right iliac bone and greater trochanter of the proximal right femur are favored to be degenerative or posttraumatic. Soft tissue metastasis felt highly unlikely. 4.  Coronary artery atherosclerosis. Aortic atherosclerosis. 5. A tiny right lower lobe pulmonary nodule is most likely a subpleural lymph node. CT CHEST W/ CONTRAST 10/1: IMPRESSION: 1. Interim development of multiple enlarged mediastinal and hilar lymph nodes consistent with metastatic disease. 2. Development of multiple bilateral pulmonary nodules, also suspicious for metastatic disease. Suspect that the previously noted right lower lobe lung mass has increased in size, now measuring about 26 mm compared with 19 mm previously. 3. New small right-sided pleural effusion. Patchy consolidations in the right lower lobe, uncertain if this represents pneumonia, aspiration, or metastatic nodules. 4. Subcentimeter hypodense foci in the liver, too small to further characterize. When the patient is clinically stable and able to follow directions and hold their breath (preferably as an outpatient) further  evaluation with dedicated abdominal MRI may be considered. PORT CXR 10/24:  Personally reviewed by  me. Persistent right basilar opacification with silhouetting of hemidiaphragm & fullness to right hilum. Streaky opacification with some suggestion of air bronchogram and consolidation within the left lower lung as well. Unable to appreciate pleural effusion meniscus. Right-sided port in place.  MICROBIOLOGY:  Blood Cultures x2 10/22 >> MSSA Sputum Culture 10/22 >>> consistent with normal respiratory flora Group A Strep Culture 10/23 >>> negative MRSA PCR 10/23:  Negative Blood Culture x1 10/24 >>> R Chest Wound 10/24 >> abundant staph >> pansensitive R Pleural Fluid 10/25 >> WBC 1,234, 97% neutrophil, LD 388, total protein <3.   R Pleural Culture 10/25 >> no growth R Pleural Cytology 10/25 >> acute reactive and mesothelial cells.  No malignant cells identified  ANTIBIOTICS: Zosyn 10/22 (x1 dose) Vancomycin 10/22 (x1 dose) Cefepime 10/23 (x2 doses) Ancef 10/23 >>>  SIGNIFICANT EVENTS  10/22 - Admitted 10/23 - Transferred to SDU w/ repeat episode hypotension 10/25 - Thoracentesis, R side with 1.5 L bloody/cloudy fluid removed   ASSESSMENT / PLAN:    61 y.o. male with stage IV metastatic squamous cell carcinoma. Previous imaging indicated evolving metastatic disease of the lungs. Patient admitted with sepsis secondary to MSSA bacteremia.  Status post right-sided thoracentesis with analysis yielding findings consistent with parapneumonic process.  Clinically stable   Sepsis - secondary to MSSA bacteremia Plan Ancef per infectious disease   Exudative Right Sided Pleural Effusion consistent with parapneumonic effusion- s/p thora on 10/25, Protein ratio 0.6, LDH ratio 2; culture negative; cytology negative. Plan Continue to treat infection Repeat chest x-ray this a.m.  RLL Consolidation - PNA vs compressive atelectasis  Plan Antibiotics Pulmonary hygiene Wean oxygen  All other issues per oncology and internal medicine service  Erick Colace ACNP-BC [facility] Pager # [phone] OR # [phone] if no answer

## 2016-12-11 NOTE — Progress Notes (Addendum)
Charted on wrong chart

## 2016-12-11 NOTE — Progress Notes (Signed)
Physical Therapy Treatment Patient Details Name: Antonio [name] MRN: [id number] DOB: [birth year] Today's Date: 12/11/2016    History of Present Illness  61 y.o. male with medical history significant of supraglottic cancer(s/p resection, chemotherapy, radiation,and PEG followed by [name]), COPD, sickle cell trait; who presents with complaints of generalized body aches, fall. Dx of sepsis due to PNA. Recent admission 10/1-10/7/18 with hypercalcemia.     PT Comments    Ambulated 150 ft with a rolling walker. Presented with a step through pattern and a decrease stride length with B valgus of the knees. Pt stated he has knee braces at home he uses. No LOB but was limited by fatigue. VC's needed for transfers for hand placement.     Follow Up Recommendations  Home health PT     Equipment Recommendations  Rolling walker with 5" wheels    Recommendations for Other Services       Precautions / Restrictions Precautions Precautions: Fall Precaution Comments: pt reports having B knee braces at home Restrictions Weight Bearing Restrictions: No    Mobility  Bed Mobility Overal bed mobility: Needs Assistance Bed Mobility: Supine to Sit           General bed mobility comments: up in recliner  Transfers Overall transfer level: Needs assistance Equipment used: Rolling walker (2 wheeled) Transfers: Sit to/from Stand Sit to Stand: Supervision;Min guard         General transfer comment: 25% VCs hand placement  Ambulation/Gait Ambulation/Gait assistance: Supervision;Min guard Ambulation Distance (Feet): 150 Feet Assistive device: Rolling walker (2 wheeled) Gait Pattern/deviations: Step-through pattern;Decreased stride length     General Gait Details: distance limited by fatigue, B knee valgus, no LOB    Stairs            Wheelchair Mobility    Modified Rankin (Stroke Patients Only)       Balance                                             Cognition Arousal/Alertness: Awake/alert Behavior During Therapy: WFL for tasks assessed/performed Overall Cognitive Status: Within Functional Limits for tasks assessed                                        Exercises      General Comments        Pertinent Vitals/Pain Pain Assessment: 0-10 Pain Score: 8  Pain Location: L knee with walking  Pain Descriptors / Indicators: Aching;Tightness;Sore    Home Living                      Prior Function            PT Goals (current goals can now be found in the care plan section)      Frequency    Min 3X/week      PT Plan      Co-evaluation              AM-PAC PT "6 Clicks" Daily Activity  Outcome Measure  Difficulty turning over in bed (including adjusting bedclothes, sheets and blankets)?: A Little Difficulty moving from lying on back to sitting on the side of the bed? : A Lot Difficulty sitting down on and standing up from a chair with arms (  e.g., wheelchair, bedside commode, etc,.)?: A Little Help needed moving to and from a bed to chair (including a wheelchair)?: A Little Help needed walking in hospital room?: A Little Help needed climbing 3-5 steps with a railing? : A Lot 6 Click Score: 16    End of Session Equipment Utilized During Treatment: Gait belt Activity Tolerance: Patient limited by fatigue;Patient limited by pain Patient left: in chair;with call bell/phone within reach Nurse Communication: Mobility status PT Visit Diagnosis: Difficulty in walking, not elsewhere classified (R26.2);Muscle weakness (generalized) (M62.81)            G Codes:       Almond Lint, SPTA [facility] [phone]  Reviewed and agree with above  Rica Koyanagi  PTA [facility] Pager      [phone]

## 2016-12-11 NOTE — Progress Notes (Signed)
Antonio [name]   DOB:[birth year]   ZO#:[id number]    Subjective: He has chronic throat pain, stable.  The patient does not understand why he is In the ICU and demanded to go home.  He appears to have poor understanding of what is going on but understood why the port has to be removed due to infection  Objective:  Vitals:   12/11/16 0800 12/11/16 1200  BP: 103/81 (!) 126/59  Pulse:    Resp: 20 19  Temp: 99.2 F (37.3 C) 99.3 F (37.4 C)  SpO2: 98% 98%     Intake/Output Summary (Last 24 hours) at 12/11/16 1242 Last data filed at 12/11/16 1227  Gross per 24 hour  Intake          2060.67 ml  Output             2300 ml  Net          -239.33 ml    GENERAL:alert, no distress and comfortable SKIN: The chest wall is examined while the nurse perform dressing changes.  The port site appears to be healing well with healthy granulation tissue EYES: normal, Conjunctiva are pink and non-injected, sclera clear Musculoskeletal:no cyanosis of digits and no clubbing  NEURO: alert & oriented x 3 with dysarthria, no focal motor/sensory deficits   Labs:  Lab Results  Component Value Date   WBC 14.0 (H) 12/11/2016   HGB 8.8 (L) 12/11/2016   HCT 26.7 (L) 12/11/2016   MCV 81.4 12/11/2016   PLT 279 12/11/2016   NEUTROABS 2.1 12/06/2016    Lab Results  Component Value Date   NA 138 12/11/2016   K 4.5 12/11/2016   CL 95 (L) 12/11/2016   CO2 35 (H) 12/11/2016    Studies:  Dg Chest Port 1 View  Result Date: 12/11/2016 CLINICAL DATA:  Pleural effusion, shortness of Breath EXAM: PORTABLE CHEST 1 VIEW COMPARISON:  12/09/2016 FINDINGS: Cardiomegaly. Small right pleural effusion with vascular congestion. Continued right lower lobe atelectasis or infiltrate and minimal left base atelectasis. No acute bony abnormality. IMPRESSION: Stable small right pleural effusion. Continued right lower lobe atelectasis or infiltrate which appears slightly worsened since prior study. Minimal left base atelectasis.  Electronically Signed   By: Rolm Baptise M.D.   On: 12/11/2016 09:36    Assessment & Plan:   Metastatic supraglottic cancer with lung metastasis The patient presented with sepsis secondary to bacteremia and/or pneumonia We will hold chemotherapy and provide supportive care for now It is not clear to me that he will be strong enough to undergo chemotherapy in the future.  Acquired pancytopenia due to chemo The patient is symptomatic He had received 1 unit of blood transfusion on December 05, 2016 He had received G-CSF support last week  MSSA bacteremia Continue IV antibiotic therapy I would defer to infectious disease team to determine the duration of IV antibiotic therapy  Recent falls Consult PT  Moderate protein calorie malnutrition Resume tube feeding  Cancer associated pain He will continue to take morphine sulfate as needed  Discharge planning He is not ready to be discharged The patient is tachycardic and weak I had a long discussion with the family member, his sister, June The patient lives alone with poor social support despite multiple family members at around Even when I saw him last week with active bacteremia, the patient has very little sense of understanding I felt that it is prudent to support him with ongoing IV antibiotics and to consider skilled nursing facility placement  afterwards for rehab If he does not improve with aggressive supportive care, he may be transitioned to palliative care/hospice   [name], MD 12/11/2016  12:42 PM

## 2016-12-11 NOTE — Progress Notes (Signed)
Date:  December 11, 2016 Chart reviewed for concurrent status and case management needs.  Will continue to follow patient progress.  Discharge Planning: following for needs  Expected discharge date: [id number]  Velva Harman, BSN, [place], [place]

## 2016-12-11 NOTE — Progress Notes (Signed)
PROGRESS NOTE  Antonio [name]  TFT:[id number] DOB: 10-07-1955 DOA: 11/24/2016 PCP: Maryellen Pile, MD  Outpatient Specialists: Oncology, [name]   Brief Narrative:  Antonio [name] is a 61 y.o. male with a history of supraglottic laryngeal CA s/p resection, chemo/radiation currently on palliative chemotherapy who was brought from home by EMS due to weakness, a fall, and confusion which was reported by his sister. He reports he slipped and fell on urine and denies any confusion. He reported chills and a productive cough to admitting MD. He denied fevers but had a temperature of 103.66F, heart rates 134-141, respirations 18-32, blood pressures 117/57-122/66, O2 saturations 91-96% on room air. Labs revealed WBC 3.7, hemoglobin 8.2, MCV 76.8, RDW 16.4, platelets 184, BUN 23, creatinine 1, albumin 2.5, lactic acid trending upwards to 1.92. Chest x-ray showed right base collapse with small pleural effusion. Urinalysis was negative for any signs of infection. He was given 3L of normal saline, empiric antibiotics and admitted. Blood cultures became positive for MSSA, antibiotics narrowed to ancef per ID recommendations. Pt became hypotensive and was transferred to the stepdown unit with CCM consult, though vitals have stabilized with fluids alone. Port was removed with purulent pocket noted on 10/24 and thoracentesis was performed 10/25.    Subjective:  -Laying in bed appears comfortable, [name] headache fever chills, [name] chest or abdominal pain, he is awaiting to discuss his care with [name] his oncologist.   Assessment & Plan:  Sepsis due to MSSA bacteremia due to port infection  - in a immunocompromised patient, port which was infected removed on 12/06/2016, he was negative for hepatitis or HIV.  ID following and on board.  Currently on Ancef since 12/05/2016.  Negative transthoracic echogram for any vegetations, TEE cannot be done due to laryngeal cancer.  Further management per ID.  Repeat blood  culture from 12/06/2016 is negative.  Procalcitonin levels have normalized, does not appear toxic.  He is still tachycardic I think this could be related to his sepsis.  RLL opacity on imaginglong with right-sided pleural effusion.  CT chest done and thoracentesis obtained by pulmonary on 12/07/2016,could have compressive atelectasis versus pneumonia has per CT findings,fluid appears to be consistent with exudate, pulmonary on board managing this issue.  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 [name] oncology and palliative care following, patient wants to discuss further course of care with [name] and then decide on his CODE STATUS and if he is to go palliative care route.  Persistent tachycardia.  In [name] pain or distress, has been hydrated, will check TSH.  Dysphagia -s/p PEG May 2018. Continue feeds thru PEG tube. Ok to give liquid po for pt comfort.  Pancytopenia -Suspect due to marrow suppression.  Required 1 unit of packed RBC transfusion on 12/05/2016, stable H&H since then.   Fall with generalized weakness -due to weakness and deconditioning, supportive care, PT.  History of hypercalcemia of malignancy - Admitted earlier Oct 2018.  Resolved, will monitor.   Peripheral neuropathy - Continue gabapentin    DVT prophylaxis: SCDs Code Status: Full Family Communication: None at bedside this AM Disposition Plan: continue in SDU today.   Consultants:    Oncology, [name].  ID, Dr. Johnnye Sima  IR, Dr. Anselm Pancoast  CCM, Dr. Ashok Cordia   Palliative care team  Procedures:    Echocardiogram 12/06/2016:   Left ventricle: The cavity size was normal. Wall thickness was normal. Systolic function was normal. The estimated ejection  fraction was in  the range of 55% to 60%. Wall motion was normal;  there were [name] regional wall motion abnormalities. Doppler  parameters are consistent with abnormal left ventricular  relaxation  (grade 1 diastolic dysfunction).  Mitral valve: Calcified annulus.  Impressions: - Normal LV systolic function; mild diastolic dysfunction.   Antimicrobials:   Vancomycin 10/22 > 10/23  Zosyn 10/22  Cefepime 10/22 > 10/23   Ancef 10/23 >>     Objective:  Vitals:   12/11/16 0200 12/11/16 0415 12/11/16 0600 12/11/16 0800  BP: (!) 111/37  128/75 103/81  Pulse:      Resp: 19  (!) 26 20  Temp:  99.2 F (37.3 C)  99.2 F (37.3 C)  TempSrc:  Oral  Oral  SpO2: (!) 89%  (!) 76% 98%  Weight:   55.9 kg (123 lb 3.8 oz)   Height:        Intake/Output Summary (Last 24 hours) at 12/11/16 1001 Last data filed at 12/11/16 0926  Gross per 24 hour  Intake          1860.67 ml  Output             2300 ml  Net          -439.33 ml   Filed Weights   12/09/16 0400 12/10/16 0305 12/11/16 0600  Weight: 58.8 kg (129 lb 10.1 oz) 57.1 kg (125 lb 14.1 oz) 55.9 kg (123 lb 3.8 oz)    Exam  Awake Alert, [name] new F.N deficits, Normal affect [place].AT,PERRAL Supple Neck, [name] JVD, [name] cervical lymphadenopathy appriciated.  Symmetrical Chest wall movement, Good air movement bilaterally, CTAB RRR,[name] Gallops,Rubs or new Murmurs, [name] Parasternal Heave +ve B.Sounds, Abd Soft, [name] tenderness, [name] organomegaly appriciated, [name] rebound - guarding or rigidity. PEG tube is in place site appears clean [name] Cyanosis, Clubbing or edema, [name] new Rash or bruise    Data Reviewed: I have personally reviewed following labs and imaging studies  CBC:  Recent Labs Lab 11/29/2016 1837  12/06/16 0405 12/07/16 0327 12/08/16 0321 12/09/16 0316 12/10/16 0328 12/11/16 0339  WBC 3.7*  < > 2.6* 3.1* 5.2 7.3 11.0* 14.0*  NEUTROABS 3.2  --  2.1  --   --   --   --   --   HGB 8.2*  < > 8.7* 9.6* 9.5* 8.1* 8.4* 8.8*  HCT 23.5*  < > 24.8* 27.5* 28.0* 23.6* 24.9* 26.7*  MCV 76.8*  < > 78.0 78.1 78.9 79.2 79.6 81.4  PLT 184  < > 126* 139* 173 197 259 279  < > = values in this interval not displayed. Basic Metabolic  Panel:  Recent Labs Lab 12/05/16 1941 12/05/16 2022  12/07/16 0327 12/08/16 0321 12/09/16 0316 12/10/16 0328 12/11/16 0339  NA 138 137  < > 140 140 138 137 138  K 2.9* 2.8*  < > 2.9* 3.0* 3.3* 3.5 4.5  CL 104 105  < > 107 103 97* 92* 95*  CO2 23 23  < > 26 29 31  35* 35*  GLUCOSE 157* 168*  < > 113* 108* 121* 105* 100*  BUN 21* 21*  < > 15 12 11 11 11   CREATININE 0.78 0.75  < > 0.66 0.65 0.60* 0.63 0.61  CALCIUM 7.4* 7.1*  < > 8.0* 8.5* 8.9 9.0 9.2  MG 2.0 2.0  --   --   --   --  1.7 1.9  PHOS 2.4* 2.5  --   --   --   --   --   --   < > =  values in this interval not displayed. GFR: Estimated Creatinine Clearance: 77.6 mL/min (by C-G formula based on SCr of 0.61 mg/dL). Liver Function Tests:  Recent Labs Lab 12/11/2016 1837 12/06/16 0405 12/07/16 1104  AST 38 53*  --   ALT 57 52  --   ALKPHOS 91 90  --   BILITOT 1.3* 0.9  --   PROT 6.3* 5.2* 5.1*  ALBUMIN 2.5* 2.0*  --    [name] results for input(s): LIPASE, AMYLASE in the last 168 hours. [name] results for input(s): AMMONIA in the last 168 hours. Coagulation Profile:  Recent Labs Lab 12/09/2016 1837  INR 1.29   Cardiac Enzymes:  Recent Labs Lab 11/19/2016 1924 12/05/16 0037 12/05/16 0244  TROPONINI 0.03* 0.03* <0.03   BNP (last 3 results) [name] results for input(s): PROBNP in the last 8760 hours. HbA1C: [name] results for input(s): HGBA1C in the last 72 hours. CBG:  Recent Labs Lab 12/10/16 0733 12/10/16 1209 12/10/16 1619 12/10/16 2130 12/11/16 0805  GLUCAP 146* 134* 148* 158* 128*   Lipid Profile: [name] results for input(s): CHOL, HDL, LDLCALC, TRIG, CHOLHDL, LDLDIRECT in the last 72 hours. Thyroid Function Tests: [name] results for input(s): TSH, T4TOTAL, FREET4, T3FREE, THYROIDAB in the last 72 hours. Anemia Panel:  Recent Labs  12/09/16 0807  VITAMINB12 1,044*  FOLATE 19.5  FERRITIN 1,024*  TIBC NOT CALCULATED  IRON 12*  RETICCTPCT 0.5   Urine analysis:    Component Value Date/Time   COLORURINE YELLOW  12/09/2016 1847   APPEARANCEUR CLEAR 12/07/2016 1847   LABSPEC 1.013 12/01/2016 1847   PHURINE 5.0 11/18/2016 1847   GLUCOSEU NEGATIVE 11/26/2016 1847   HGBUR NEGATIVE 11/13/2016 1847   BILIRUBINUR NEGATIVE 12/12/2016 1847   KETONESUR NEGATIVE 11/15/2016 1847   PROTEINUR NEGATIVE 11/19/2016 1847   NITRITE NEGATIVE 12/03/2016 1847   LEUKOCYTESUR NEGATIVE 12/10/2016 1847   Recent Results (from the past 240 hour(s))  Culture, blood (Routine x 2)     Status: Abnormal   Collection Time: 12/09/2016  6:37 PM  Result Value Ref Range Status   Specimen Description BLOOD RIGHT ANTECUBITAL  Final   Special Requests   Final    BOTTLES DRAWN AEROBIC AND ANAEROBIC Blood Culture adequate volume   Culture  Setup Time   Final    GRAM POSITIVE COCCI IN BOTH AEROBIC AND ANAEROBIC BOTTLES CRITICAL RESULT CALLED TO, READ BACK BY AND VERIFIED WITH: M. Lear Ng Pharm.D. 10:55 12/05/16 ([email or web address]) Performed at [facility], [place] [address] [place], [place] [address]    Culture STAPHYLOCOCCUS AUREUS (A)  Final   Report Status 12/07/2016 FINAL  Final   Organism ID, Bacteria STAPHYLOCOCCUS AUREUS  Final      Susceptibility   Staphylococcus aureus - MIC*    CIPROFLOXACIN <=0.5 SENSITIVE Sensitive     ERYTHROMYCIN <=0.25 SENSITIVE Sensitive     GENTAMICIN <=0.5 SENSITIVE Sensitive     OXACILLIN 0.5 SENSITIVE Sensitive     TETRACYCLINE <=1 SENSITIVE Sensitive     VANCOMYCIN 1 SENSITIVE Sensitive     TRIMETH/SULFA <=10 SENSITIVE Sensitive     CLINDAMYCIN <=0.25 SENSITIVE Sensitive     RIFAMPIN <=0.5 SENSITIVE Sensitive     Inducible Clindamycin NEGATIVE Sensitive     * STAPHYLOCOCCUS AUREUS  Blood Culture ID Panel (Reflexed)     Status: Abnormal   Collection Time: 12/12/2016  6:37 PM  Result Value Ref Range Status   Enterococcus species NOT DETECTED NOT DETECTED Final   Listeria monocytogenes NOT DETECTED NOT DETECTED Final   Staphylococcus  species DETECTED (A) NOT DETECTED Final    Comment: CRITICAL  RESULT CALLED TO, READ BACK BY AND VERIFIED WITH: M. Lear Ng Pharm.D. 10:55 12/05/16 ([email or web address])    Staphylococcus aureus DETECTED (A) NOT DETECTED Final    Comment: Methicillin (oxacillin) susceptible Staphylococcus aureus (MSSA). Preferred therapy is anti staphylococcal beta lactam antibiotic (Cefazolin or Nafcillin), unless clinically contraindicated. CRITICAL RESULT CALLED TO, READ BACK BY AND VERIFIED WITH: M. Lear Ng Pharm.D. 10:55 12/05/16 ([email or web address])    Methicillin resistance NOT DETECTED NOT DETECTED Final   Streptococcus species NOT DETECTED NOT DETECTED Final   Streptococcus agalactiae NOT DETECTED NOT DETECTED Final   Streptococcus pneumoniae NOT DETECTED NOT DETECTED Final   Streptococcus pyogenes NOT DETECTED NOT DETECTED Final   Acinetobacter baumannii NOT DETECTED NOT DETECTED Final   Enterobacteriaceae species NOT DETECTED NOT DETECTED Final   Enterobacter cloacae complex NOT DETECTED NOT DETECTED Final   Escherichia coli NOT DETECTED NOT DETECTED Final   Klebsiella oxytoca NOT DETECTED NOT DETECTED Final   Klebsiella pneumoniae NOT DETECTED NOT DETECTED Final   Proteus species NOT DETECTED NOT DETECTED Final   Serratia marcescens NOT DETECTED NOT DETECTED Final   Haemophilus influenzae NOT DETECTED NOT DETECTED Final   Neisseria meningitidis NOT DETECTED NOT DETECTED Final   Pseudomonas aeruginosa NOT DETECTED NOT DETECTED Final   Candida albicans NOT DETECTED NOT DETECTED Final   Candida glabrata NOT DETECTED NOT DETECTED Final   Candida krusei NOT DETECTED NOT DETECTED Final   Candida parapsilosis NOT DETECTED NOT DETECTED Final   Candida tropicalis NOT DETECTED NOT DETECTED Final    Comment: Performed at [facility], [address] [place], [place] [address]  Culture, blood (Routine x 2)     Status: Abnormal   Collection Time: 11/17/2016  6:40 PM  Result Value Ref Range Status   Specimen Description BLOOD RIGHT FOREARM  Final   Special Requests   Final    BOTTLES  DRAWN AEROBIC AND ANAEROBIC Blood Culture adequate volume   Culture  Setup Time   Final    GRAM POSITIVE COCCI IN BOTH AEROBIC AND ANAEROBIC BOTTLES CRITICAL VALUE NOTED.  VALUE IS CONSISTENT WITH PREVIOUSLY REPORTED AND CALLED VALUE.    Culture (A)  Final    STAPHYLOCOCCUS AUREUS SUSCEPTIBILITIES PERFORMED ON PREVIOUS CULTURE WITHIN THE LAST 5 DAYS. Performed at [facility], [place] [address], [place], [place] [address]    Report Status 12/07/2016 FINAL  Final  Culture, sputum-assessment     Status: None   Collection Time: 11/29/2016 11:40 PM  Result Value Ref Range Status   Specimen Description SPUTUM  Final   Special Requests Immunocompromised  Final   Sputum evaluation THIS SPECIMEN IS ACCEPTABLE FOR SPUTUM CULTURE  Final   Report Status 12/05/2016 FINAL  Final  Culture, respiratory (NON-Expectorated)     Status: None   Collection Time: 12/02/2016 11:40 PM  Result Value Ref Range Status   Specimen Description SPUTUM  Final   Special Requests Immunocompromised Reflexed from [id number]  Final   Gram Stain   Final    RARE WBC PRESENT,BOTH PMN AND MONONUCLEAR ABUNDANT SQUAMOUS EPITHELIAL CELLS PRESENT MODERATE GRAM POSITIVE COCCI MODERATE GRAM POSITIVE RODS MODERATE GRAM NEGATIVE RODS    Culture   Final    MODERATE Consistent with normal respiratory flora. Performed at [facility], [place] [address] [place], [place] [address]    Report Status 12/08/2016 FINAL  Final  Rapid strep screen     Status: None   Collection Time:  12/05/16  4:04 PM  Result Value Ref Range Status   Streptococcus, Group A Screen (Direct) NEGATIVE NEGATIVE Final    Comment: (NOTE) A Rapid Antigen test may result negative if the antigen level in the sample is below the detection level of this test. The FDA has not cleared this test as a stand-alone test therefore the rapid antigen negative result has reflexed to a Group A Strep culture.   Culture, group A strep     Status: None   Collection Time:  12/05/16  4:04 PM  Result Value Ref Range Status   Specimen Description THROAT  Final   Special Requests NONE Reflexed from [id number]  Final   Culture   Final    [name] GROUP A STREP (S.PYOGENES) ISOLATED Performed at [facility], [address], [place], [place] [address]    Report Status 12/08/2016 FINAL  Final  MRSA PCR Screening     Status: None   Collection Time: 12/05/16  8:20 PM  Result Value Ref Range Status   MRSA by PCR NEGATIVE NEGATIVE Final    Comment:        The GeneXpert MRSA Assay (FDA approved for NASAL specimens only), is one component of a comprehensive MRSA colonization surveillance program. It is not intended to diagnose MRSA infection nor to guide or monitor treatment for MRSA infections.   Culture, blood (Routine X 2) w Reflex to ID Panel     Status: None (Preliminary result)   Collection Time: 12/06/16  4:05 AM  Result Value Ref Range Status   Specimen Description BLOOD LEFT HAND  Final   Special Requests   Final    BOTTLES DRAWN AEROBIC AND ANAEROBIC Blood Culture adequate volume   Culture   Final    [name] GROWTH 4 DAYS Performed at [facility], [place] [address] [place], [place] [address]    Report Status PENDING  Incomplete  Aerobic/Anaerobic Culture (surgical/deep wound)     Status: None (Preliminary result)   Collection Time: 12/06/16 12:41 PM  Result Value Ref Range Status   Specimen Description WOUND RIGHT CHEST  Final   Special Requests Normal  Final   Gram Stain   Final    ABUNDANT WBC PRESENT, PREDOMINANTLY PMN ABUNDANT GRAM POSITIVE COCCI IN PAIRS Performed at [facility], [place] [address] [place], [place] [address]    Culture   Final    ABUNDANT STAPHYLOCOCCUS AUREUS [name] ANAEROBES ISOLATED; CULTURE IN PROGRESS FOR 5 DAYS    Report Status PENDING  Incomplete   Organism ID, Bacteria STAPHYLOCOCCUS AUREUS  Final      Susceptibility   Staphylococcus aureus - MIC*    CIPROFLOXACIN <=0.5 SENSITIVE Sensitive     ERYTHROMYCIN  <=0.25 SENSITIVE Sensitive     GENTAMICIN <=0.5 SENSITIVE Sensitive     OXACILLIN 0.5 SENSITIVE Sensitive     TETRACYCLINE <=1 SENSITIVE Sensitive     VANCOMYCIN <=0.5 SENSITIVE Sensitive     TRIMETH/SULFA <=10 SENSITIVE Sensitive     CLINDAMYCIN <=0.25 SENSITIVE Sensitive     RIFAMPIN <=0.5 SENSITIVE Sensitive     Inducible Clindamycin NEGATIVE Sensitive     * ABUNDANT STAPHYLOCOCCUS AUREUS  Body fluid culture     Status: None   Collection Time: 12/07/16 10:31 AM  Result Value Ref Range Status   Specimen Description PLEURAL RIGHT  Final   Special Requests Immunocompromised  Final   Gram Stain   Final    ABUNDANT WBC PRESENT,BOTH PMN AND MONONUCLEAR [name] ORGANISMS  SEEN    Culture   Final    [name] GROWTH 3 DAYS Performed at [facility], [place] [address] [place], [place] [address]    Report Status 12/10/2016 FINAL  Final      Radiology Studies: Dg Chest Port 1 View  Result Date: 12/11/2016 CLINICAL DATA:  Pleural effusion, shortness of Breath EXAM: PORTABLE CHEST 1 VIEW COMPARISON:  12/09/2016 FINDINGS: Cardiomegaly. Small right pleural effusion with vascular congestion. Continued right lower lobe atelectasis or infiltrate and minimal left base atelectasis. [name] acute bony abnormality. IMPRESSION: Stable small right pleural effusion. Continued right lower lobe atelectasis or infiltrate which appears slightly worsened since prior study. Minimal left base atelectasis. Electronically Signed   By: Rolm Baptise M.D.   On: 12/11/2016 09:36    Scheduled Meds: . chlorhexidine  15 mL Mouth Rinse BID  . enoxaparin (LOVENOX) injection  40 mg Subcutaneous Q24H  . feeding supplement (OSMOLITE 1.5 CAL)  237 mL Per Tube 6 X Daily  . free water  100 mL Per Tube Q4H  . gabapentin  300 mg Per Tube TID  . mouth rinse  15 mL Mouth Rinse q12n4p  . pantoprazole (PROTONIX) IV  40 mg Intravenous Q12H  . senna-docusate  1 tablet Oral BID   Continuous Infusions: . sodium chloride    .  ceFAZolin  (ANCEF) IV 2 g (12/11/16 0926)     LOS: 7 days   Signature  Lala Lund M.D on 12/11/2016 at 10:01 AM  Between 7am to 7pm - Pager - [phone] ( page via [email or web address], text pages only, please mention full 10 digit call back number).  After 7pm go to [email or web address] - password [facility]

## 2016-12-12 ENCOUNTER — Inpatient Hospital Stay (HOSPITAL_COMMUNITY): Payer: Medicare Other

## 2016-12-12 DIAGNOSIS — I361 Nonrheumatic tricuspid (valve) insufficiency: Secondary | ICD-10-CM

## 2016-12-12 DIAGNOSIS — D72829 Elevated white blood cell count, unspecified: Secondary | ICD-10-CM

## 2016-12-12 LAB — ECHOCARDIOGRAM COMPLETE
Height: 63 in
Weight: 2024.7 oz

## 2016-12-12 LAB — GLUCOSE, CAPILLARY
GLUCOSE-CAPILLARY: 97 mg/dL (ref 65–99)
GLUCOSE-CAPILLARY: 144 mg/dL — AB (ref 65–99)
GLUCOSE-CAPILLARY: 104 mg/dL — AB (ref 65–99)
Glucose-Capillary: 142 mg/dL — ABNORMAL HIGH (ref 65–99)
Glucose-Capillary: 124 mg/dL — ABNORMAL HIGH (ref 65–99)

## 2016-12-12 MED ORDER — METOPROLOL TARTRATE 50 MG PO TABS
50.0000 mg | ORAL_TABLET | Freq: Two times a day (BID) | ORAL | Status: DC
  Administered 2016-12-12 – 2016-12-13 (×4): 50 mg via ORAL
  Filled 2016-12-12 (×3): qty 1
  Filled 2016-12-12: qty 2

## 2016-12-12 MED ORDER — LACTATED RINGERS IV SOLN
INTRAVENOUS | Status: AC
  Administered 2016-12-12 – 2016-12-13 (×3): via INTRAVENOUS

## 2016-12-12 MED ORDER — SODIUM CHLORIDE 0.9 % IV BOLUS (SEPSIS)
500.0000 mL | Freq: Once | INTRAVENOUS | Status: AC
  Administered 2016-12-12: 500 mL via INTRAVENOUS

## 2016-12-12 NOTE — Progress Notes (Signed)
PROGRESS NOTE  Antonio [name]  DDU:[id number] DOB: 25-Dec-1955 DOA: 12/03/2016 PCP: Maryellen Pile, MD  Outpatient Specialists: Oncology, [name]   Brief Narrative:  Antonio [name] is a 61 y.o. male with a history of supraglottic laryngeal CA s/p resection, chemo/radiation currently on palliative chemotherapy who was brought from home by EMS due to weakness, a fall, and confusion which was reported by his sister. He reports he slipped and fell on urine and denies any confusion. He reported chills and a productive cough to admitting MD. He denied fevers but had a temperature of 103.41F, heart rates 134-141, respirations 18-32, blood pressures 117/57-122/66, O2 saturations 91-96% on room air. Labs revealed WBC 3.7, hemoglobin 8.2, MCV 76.8, RDW 16.4, platelets 184, BUN 23, creatinine 1, albumin 2.5, lactic acid trending upwards to 1.92. Chest x-ray showed right base collapse with small pleural effusion. Urinalysis was negative for any signs of infection. He was given 3L of normal saline, empiric antibiotics and admitted. Blood cultures became positive for MSSA, antibiotics narrowed to ancef per ID recommendations. Pt became hypotensive and was transferred to the stepdown unit with CCM consult, though vitals have stabilized with fluids alone. Port was removed with purulent pocket noted on 10/24 and thoracentesis was performed 10/25 fluid consistent with exudate, pulmonary has seen and signed off.   He is currently on IV antibiotics, doing fairly well however low-grade temp on 12/11/2016 of 100.5 for which ID will reevaluate him today, he will be transferred out of stepdown to a telemetry bed, he remains weak but unfortunately did not qualify for SNF.  Will request case management to assist with discharge planning.   Subjective:  - .Patient in bed, appears comfortable, denies any headache, no fever, no chest pain or pressure, no shortness of breath , no abdominal pain. No focal  weakness. bk  Assessment & Plan:  Sepsis due to MSSA bacteremia due to port infection  - in a immunocompromised patient, port which was infected removed on 12/06/2016, he was negative for hepatitis or HIV.  ID following and on board.  Currently on Ancef since 12/05/2016.  Negative transthoracic echogram for any vegetations, TEE cannot be done due to laryngeal cancer.  Further management per ID.  Repeat blood culture from 12/06/2016 is negative.  Procalcitonin levels have normalized, does not appear toxic.  He did run a low-grade temp of 100.5 last evening, have requested ID to reevaluate him again on 12/12/2016.  RLL opacity on imaginglong with right-sided pleural effusion.  CT chest done and thoracentesis obtained by pulmonary on 12/07/2016,could have compressive atelectasis versus pneumonia has per CT findings,fluid appears to be consistent with exudate, pulmonary on board managing this issue.  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 [name] oncology and palliative care following, for now wishes to pursue full code and palliative chemo if needed.  Once discharged follow with Dr. Florene Glen for future planning.  If declines may transition to palliative care/hospice but not now.  Persistent tachycardia.  In no pain or distress, has been hydrated, stable TSH and echogram, He is on intermittent tube feeds via PEG tube and in between he is n.p.o. due to his laryngeal cancer, I think he is getting dehydrated, will hydrate and monitor.  Dysphagia -s/p PEG May 2018. Continue feeds thru PEG tube. Ok to give liquid po for pt comfort.  Pancytopenia -Suspect due to marrow suppression.  Required 1 unit of packed RBC transfusion on 12/05/2016, stable H&H since then.   Fall with generalized weakness -  due to weakness and deconditioning, supportive care, PT.  History of hypercalcemia of malignancy - Admitted earlier Oct 2018.  Resolved, will monitor.    Peripheral neuropathy - Continue gabapentin   Essential hypertension.  Placed on beta-blocker will monitor.   DVT prophylaxis: SCDs Code Status: Full Family Communication: None at bedside this AM Disposition Plan: Telemetry bed out of stepdown, unfortunately he does not qualify for SNF per PT notes.  Case management to assist on discharge planning.  Consultants:    Oncology, [name].  ID, Dr. Johnnye Sima  IR, Dr. Anselm Pancoast  CCM, Dr. Ashok Cordia   Palliative care team  Procedures:    Echocardiogram 12/06/2016:   Left ventricle: The cavity size was normal. Wall thickness was normal. Systolic function was normal. The estimated ejection  fraction was in the range of 55% to 60%. Wall motion was normal;  there were no regional wall motion abnormalities. Doppler  parameters are consistent with abnormal left ventricular  relaxation (grade 1 diastolic dysfunction).  Mitral valve: Calcified annulus.  Impressions: - Normal LV systolic function; mild diastolic dysfunction.   Antimicrobials:   Vancomycin 10/22 > 10/23  Zosyn 10/22  Cefepime 10/22 > 10/23   Ancef 10/23 >>     Objective:  Vitals:   12/12/16 0329 12/12/16 0400 12/12/16 0500 12/12/16 0800  BP:  (!) 147/90  (!) 142/65  Pulse:      Resp:  20  19  Temp: 98.8 F (37.1 C)   97.7 F (36.5 C)  TempSrc: Oral   Oral  SpO2:  100%  100%  Weight:   57.4 kg (126 lb 8.7 oz)   Height:        Intake/Output Summary (Last 24 hours) at 12/12/16 0944 Last data filed at 12/12/16 8841  Gross per 24 hour  Intake             1374 ml  Output             2025 ml  Net             -651 ml   Filed Weights   12/10/16 0305 12/11/16 0600 12/12/16 0500  Weight: 57.1 kg (125 lb 14.1 oz) 55.9 kg (123 lb 3.8 oz) 57.4 kg (126 lb 8.7 oz)    Exam  Awake, alert, no focal deficits. Soft voice, NCAT, pupils equal size reactive to light Supple neck, no JVD, Daily movement bilaterally, CTA B. RRR, slightly tachycardic, no gallops rubs or  murmurs. Positive bowel sounds, no abdominal tenderness, PEG tube in place site clean. No cyanosis clubbing or edema   Data Reviewed: I have personally reviewed following labs and imaging studies  CBC:  Recent Labs Lab 12/06/16 0405 12/07/16 0327 12/08/16 0321 12/09/16 0316 12/10/16 0328 12/11/16 0339  WBC 2.6* 3.1* 5.2 7.3 11.0* 14.0*  NEUTROABS 2.1  --   --   --   --   --   HGB 8.7* 9.6* 9.5* 8.1* 8.4* 8.8*  HCT 24.8* 27.5* 28.0* 23.6* 24.9* 26.7*  MCV 78.0 78.1 78.9 79.2 79.6 81.4  PLT 126* 139* 173 197 259 660   Basic Metabolic Panel:  Recent Labs Lab 12/05/16 1941 12/05/16 2022  12/07/16 0327 12/08/16 0321 12/09/16 0316 12/10/16 0328 12/11/16 0339  NA 138 137  < > 140 140 138 137 138  K 2.9* 2.8*  < > 2.9* 3.0* 3.3* 3.5 4.5  CL 104 105  < > 107 103 97* 92* 95*  CO2 23 23  < > 26 29 31  35* 35*  GLUCOSE 157* 168*  < > 113* 108* 121* 105* 100*  BUN 21* 21*  < > 15 12 11 11 11   CREATININE 0.78 0.75  < > 0.66 0.65 0.60* 0.63 0.61  CALCIUM 7.4* 7.1*  < > 8.0* 8.5* 8.9 9.0 9.2  MG 2.0 2.0  --   --   --   --  1.7 1.9  PHOS 2.4* 2.5  --   --   --   --   --   --   < > = values in this interval not displayed. GFR: Estimated Creatinine Clearance: 79 mL/min (by C-G formula based on SCr of 0.61 mg/dL). Liver Function Tests:  Recent Labs Lab 12/06/16 0405 12/07/16 1104  AST 53*  --   ALT 52  --   ALKPHOS 90  --   BILITOT 0.9  --   PROT 5.2* 5.1*  ALBUMIN 2.0*  --    No results for input(s): LIPASE, AMYLASE in the last 168 hours. No results for input(s): AMMONIA in the last 168 hours. Coagulation Profile: No results for input(s): INR, PROTIME in the last 168 hours. Cardiac Enzymes: No results for input(s): CKTOTAL, CKMB, CKMBINDEX, TROPONINI in the last 168 hours. BNP (last 3 results) No results for input(s): PROBNP in the last 8760 hours. HbA1C: No results for input(s): HGBA1C in the last 72 hours. CBG:  Recent Labs Lab 12/11/16 0805 12/11/16 1207  12/11/16 1632 12/11/16 2153 12/12/16 0805  GLUCAP 128* 125* 154* 94 97   Lipid Profile: No results for input(s): CHOL, HDL, LDLCALC, TRIG, CHOLHDL, LDLDIRECT in the last 72 hours. Thyroid Function Tests:  Recent Labs  12/11/16 0858  TSH 2.265   Anemia Panel: No results for input(s): VITAMINB12, FOLATE, FERRITIN, TIBC, IRON, RETICCTPCT in the last 72 hours. Urine analysis:    Component Value Date/Time   COLORURINE YELLOW 11/19/2016 1847   APPEARANCEUR CLEAR 11/17/2016 1847   LABSPEC 1.013 11/28/2016 1847   PHURINE 5.0 12/05/2016 1847   GLUCOSEU NEGATIVE 12/13/2016 1847   HGBUR NEGATIVE 11/25/2016 1847   BILIRUBINUR NEGATIVE 11/17/2016 1847   KETONESUR NEGATIVE 12/01/2016 1847   PROTEINUR NEGATIVE 11/23/2016 1847   NITRITE NEGATIVE 11/19/2016 1847   LEUKOCYTESUR NEGATIVE 11/23/2016 1847   Recent Results (from the past 240 hour(s))  Culture, blood (Routine x 2)     Status: Abnormal   Collection Time: 11/19/2016  6:37 PM  Result Value Ref Range Status   Specimen Description BLOOD RIGHT ANTECUBITAL  Final   Special Requests   Final    BOTTLES DRAWN AEROBIC AND ANAEROBIC Blood Culture adequate volume   Culture  Setup Time   Final    GRAM POSITIVE COCCI IN BOTH AEROBIC AND ANAEROBIC BOTTLES CRITICAL RESULT CALLED TO, READ BACK BY AND VERIFIED WITH: M. Lear Ng Pharm.D. 10:55 12/05/16 ([email or web address]) Performed at [facility], [place] [address] [place], [place] [address]    Culture STAPHYLOCOCCUS AUREUS (A)  Final   Report Status 12/07/2016 FINAL  Final   Organism ID, Bacteria STAPHYLOCOCCUS AUREUS  Final      Susceptibility   Staphylococcus aureus - MIC*    CIPROFLOXACIN <=0.5 SENSITIVE Sensitive     ERYTHROMYCIN <=0.25 SENSITIVE Sensitive     GENTAMICIN <=0.5 SENSITIVE Sensitive     OXACILLIN 0.5 SENSITIVE Sensitive     TETRACYCLINE <=1 SENSITIVE Sensitive     VANCOMYCIN 1 SENSITIVE Sensitive     TRIMETH/SULFA <=10 SENSITIVE Sensitive     CLINDAMYCIN <=0.25 SENSITIVE  Sensitive  RIFAMPIN <=0.5 SENSITIVE Sensitive     Inducible Clindamycin NEGATIVE Sensitive     * STAPHYLOCOCCUS AUREUS  Blood Culture ID Panel (Reflexed)     Status: Abnormal   Collection Time: 11/14/2016  6:37 PM  Result Value Ref Range Status   Enterococcus species NOT DETECTED NOT DETECTED Final   Listeria monocytogenes NOT DETECTED NOT DETECTED Final   Staphylococcus species DETECTED (A) NOT DETECTED Final    Comment: CRITICAL RESULT CALLED TO, READ BACK BY AND VERIFIED WITH: M. Chanetta Marshall.D. 10:55 12/05/16 ([email or web address])    Staphylococcus aureus DETECTED (A) NOT DETECTED Final    Comment: Methicillin (oxacillin) susceptible Staphylococcus aureus (MSSA). Preferred therapy is anti staphylococcal beta lactam antibiotic (Cefazolin or Nafcillin), unless clinically contraindicated. CRITICAL RESULT CALLED TO, READ BACK BY AND VERIFIED WITH: M. Lear Ng Pharm.D. 10:55 12/05/16 ([email or web address])    Methicillin resistance NOT DETECTED NOT DETECTED Final   Streptococcus species NOT DETECTED NOT DETECTED Final   Streptococcus agalactiae NOT DETECTED NOT DETECTED Final   Streptococcus pneumoniae NOT DETECTED NOT DETECTED Final   Streptococcus pyogenes NOT DETECTED NOT DETECTED Final   Acinetobacter baumannii NOT DETECTED NOT DETECTED Final   Enterobacteriaceae species NOT DETECTED NOT DETECTED Final   Enterobacter cloacae complex NOT DETECTED NOT DETECTED Final   Escherichia coli NOT DETECTED NOT DETECTED Final   Klebsiella oxytoca NOT DETECTED NOT DETECTED Final   Klebsiella pneumoniae NOT DETECTED NOT DETECTED Final   Proteus species NOT DETECTED NOT DETECTED Final   Serratia marcescens NOT DETECTED NOT DETECTED Final   Haemophilus influenzae NOT DETECTED NOT DETECTED Final   Neisseria meningitidis NOT DETECTED NOT DETECTED Final   Pseudomonas aeruginosa NOT DETECTED NOT DETECTED Final   Candida albicans NOT DETECTED NOT DETECTED Final   Candida glabrata NOT DETECTED NOT DETECTED Final   Candida krusei  NOT DETECTED NOT DETECTED Final   Candida parapsilosis NOT DETECTED NOT DETECTED Final   Candida tropicalis NOT DETECTED NOT DETECTED Final    Comment: Performed at [facility], [address], [place], [place] [address]  Culture, blood (Routine x 2)     Status: Abnormal   Collection Time: 12/01/2016  6:40 PM  Result Value Ref Range Status   Specimen Description BLOOD RIGHT FOREARM  Final   Special Requests   Final    BOTTLES DRAWN AEROBIC AND ANAEROBIC Blood Culture adequate volume   Culture  Setup Time   Final    GRAM POSITIVE COCCI IN BOTH AEROBIC AND ANAEROBIC BOTTLES CRITICAL VALUE NOTED.  VALUE IS CONSISTENT WITH PREVIOUSLY REPORTED AND CALLED VALUE.    Culture (A)  Final    STAPHYLOCOCCUS AUREUS SUSCEPTIBILITIES PERFORMED ON PREVIOUS CULTURE WITHIN THE LAST 5 DAYS. Performed at [facility], [place] [address] [place], [place] [address]    Report Status 12/07/2016 FINAL  Final  Culture, sputum-assessment     Status: None   Collection Time: 11/28/2016 11:40 PM  Result Value Ref Range Status   Specimen Description SPUTUM  Final   Special Requests Immunocompromised  Final   Sputum evaluation THIS SPECIMEN IS ACCEPTABLE FOR SPUTUM CULTURE  Final   Report Status 12/05/2016 FINAL  Final  Culture, respiratory (NON-Expectorated)     Status: None   Collection Time: 12/05/2016 11:40 PM  Result Value Ref Range Status   Specimen Description SPUTUM  Final   Special Requests Immunocompromised Reflexed from [id number]  Final   Gram Stain   Final    RARE WBC PRESENT,BOTH PMN AND MONONUCLEAR ABUNDANT SQUAMOUS EPITHELIAL CELLS  PRESENT MODERATE GRAM POSITIVE COCCI MODERATE GRAM POSITIVE RODS MODERATE GRAM NEGATIVE RODS    Culture   Final    MODERATE Consistent with normal respiratory flora. Performed at [facility], [place] [address], [place], [place] [address]    Report Status 12/08/2016 FINAL  Final  Rapid strep screen     Status: None   Collection Time: 12/05/16  4:04 PM   Result Value Ref Range Status   Streptococcus, Group A Screen (Direct) NEGATIVE NEGATIVE Final    Comment: (NOTE) A Rapid Antigen test may result negative if the antigen level in the sample is below the detection level of this test. The FDA has not cleared this test as a stand-alone test therefore the rapid antigen negative result has reflexed to a Group A Strep culture.   Culture, group A strep     Status: None   Collection Time: 12/05/16  4:04 PM  Result Value Ref Range Status   Specimen Description THROAT  Final   Special Requests NONE Reflexed from [id number]  Final   Culture   Final    NO GROUP A STREP (S.PYOGENES) ISOLATED Performed at [facility], [address] [place], [place] [address]    Report Status 12/08/2016 FINAL  Final  MRSA PCR Screening     Status: None   Collection Time: 12/05/16  8:20 PM  Result Value Ref Range Status   MRSA by PCR NEGATIVE NEGATIVE Final    Comment:        The GeneXpert MRSA Assay (FDA approved for NASAL specimens only), is one component of a comprehensive MRSA colonization surveillance program. It is not intended to diagnose MRSA infection nor to guide or monitor treatment for MRSA infections.   Culture, blood (Routine X 2) w Reflex to ID Panel     Status: None   Collection Time: 12/06/16  4:05 AM  Result Value Ref Range Status   Specimen Description BLOOD LEFT HAND  Final   Special Requests   Final    BOTTLES DRAWN AEROBIC AND ANAEROBIC Blood Culture adequate volume   Culture   Final    NO GROWTH 5 DAYS Performed at [facility], [address] [place], [place] [address]    Report Status 12/11/2016 FINAL  Final  Aerobic/Anaerobic Culture (surgical/deep wound)     Status: None   Collection Time: 12/06/16 12:41 PM  Result Value Ref Range Status   Specimen Description WOUND RIGHT CHEST  Final   Special Requests Normal  Final   Gram Stain   Final    ABUNDANT WBC PRESENT, PREDOMINANTLY PMN ABUNDANT GRAM POSITIVE COCCI  IN PAIRS    Culture   Final    ABUNDANT STAPHYLOCOCCUS AUREUS NO ANAEROBES ISOLATED Performed at [facility], [place] [address], [place],  [address]    Report Status 12/11/2016 FINAL  Final   Organism ID, Bacteria STAPHYLOCOCCUS AUREUS  Final      Susceptibility   Staphylococcus aureus - MIC*    CIPROFLOXACIN <=0.5 SENSITIVE Sensitive     ERYTHROMYCIN <=0.25 SENSITIVE Sensitive     GENTAMICIN <=0.5 SENSITIVE Sensitive     OXACILLIN 0.5 SENSITIVE Sensitive     TETRACYCLINE <=1 SENSITIVE Sensitive     VANCOMYCIN <=0.5 SENSITIVE Sensitive     TRIMETH/SULFA <=10 SENSITIVE Sensitive     CLINDAMYCIN <=0.25 SENSITIVE Sensitive     RIFAMPIN <=0.5 SENSITIVE Sensitive     Inducible Clindamycin NEGATIVE Sensitive     * ABUNDANT STAPHYLOCOCCUS  AUREUS  Body fluid culture     Status: None   Collection Time: 12/07/16 10:31 AM  Result Value Ref Range Status   Specimen Description PLEURAL RIGHT  Final   Special Requests Immunocompromised  Final   Gram Stain   Final    ABUNDANT WBC PRESENT,BOTH PMN AND MONONUCLEAR NO ORGANISMS SEEN    Culture   Final    NO GROWTH 3 DAYS Performed at [facility], [address] [place], [place] [address]    Report Status 12/10/2016 FINAL  Final      Radiology Studies: Dg Chest Port 1 View  Result Date: 12/11/2016 CLINICAL DATA:  Pleural effusion, shortness of Breath EXAM: PORTABLE CHEST 1 VIEW COMPARISON:  12/09/2016 FINDINGS: Cardiomegaly. Small right pleural effusion with vascular congestion. Continued right lower lobe atelectasis or infiltrate and minimal left base atelectasis. No acute bony abnormality. IMPRESSION: Stable small right pleural effusion. Continued right lower lobe atelectasis or infiltrate which appears slightly worsened since prior study. Minimal left base atelectasis. Electronically Signed   By: Rolm Baptise M.D.   On: 12/11/2016 09:36    Scheduled Meds: . chlorhexidine  15 mL Mouth Rinse BID  . enoxaparin (LOVENOX)  injection  40 mg Subcutaneous Q24H  . feeding supplement (OSMOLITE 1.5 CAL)  237 mL Per Tube 6 X Daily  . free water  100 mL Per Tube Q4H  . gabapentin  300 mg Per Tube TID  . mouth rinse  15 mL Mouth Rinse q12n4p  . metoprolol tartrate  50 mg Oral BID  . pantoprazole (PROTONIX) IV  40 mg Intravenous Q12H  . senna-docusate  1 tablet Oral BID   Continuous Infusions: . sodium chloride    .  ceFAZolin (ANCEF) IV 2 g (12/12/16 0922)  . lactated ringers 125 mL/hr at 12/12/16 0823     LOS: 8 days   Signature  Lala Lund M.D on 12/12/2016 at 9:44 AM  Between 7am to 7pm - Pager - [phone] ( page via [email or web address], text pages only, please mention full 10 digit call back number).  After 7pm go to [email or web address] - password [facility]

## 2016-12-12 NOTE — Progress Notes (Signed)
INFECTIOUS DISEASE PROGRESS NOTE  ID: Nylan Nevel is a 61 y.o. 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  +Difficulty with swallowing liquids  Abtx:  Anti-infectives    Start     Dose/Rate Route Frequency Ordered Stop   12/06/16 1000  vancomycin (VANCOCIN) IVPB 750 mg/150 ml premix  Status:  Discontinued     750 mg 150 mL/hr over 60 Minutes Intravenous Every 12 hours 12/05/16 0008 12/05/16 0009   12/05/16 1800  ceFAZolin (ANCEF) IVPB 2g/100 mL premix     2 g 200 mL/hr over 30 Minutes Intravenous Every 8 hours 12/05/16 1122     12/05/16 1000  vancomycin (VANCOCIN) IVPB 750 mg/150 ml premix  Status:  Discontinued     750 mg 150 mL/hr over 60 Minutes Intravenous Every 12 hours 12/05/16 0009 12/05/16 1118   12/05/16 0200  ceFEPIme (MAXIPIME) 1 g in dextrose 5 % 50 mL IVPB  Status:  Discontinued     1 g 100 mL/hr over 30 Minutes Intravenous Every 8 hours 12/05/16 0009 12/05/16 1118   11/26/2016 1930  vancomycin (VANCOCIN) IVPB 1000 mg/200 mL premix     1,000 mg 200 mL/hr over 60 Minutes Intravenous  Once 11/26/2016 1915 11/26/2016 2238   11/27/2016 1930  piperacillin-tazobactam (ZOSYN) IVPB 3.375 g     3.375 g 100 mL/hr over 30 Minutes Intravenous  Once 11/18/2016 1915 12/08/2016 2001      Medications:  Scheduled: . chlorhexidine  15 mL Mouth Rinse BID  . enoxaparin (LOVENOX) injection  40 mg Subcutaneous Q24H  . feeding supplement (OSMOLITE 1.5 CAL)  237 mL Per Tube  6 X Daily  . free water  100 mL Per Tube Q4H  . gabapentin  300 mg Per Tube TID  . mouth rinse  15 mL Mouth Rinse q12n4p  . metoprolol tartrate  50 mg Oral BID  . pantoprazole (PROTONIX) IV  40 mg Intravenous Q12H  . senna-docusate  1 tablet Oral BID    Objective: Vital signs in last 24 hours: Temp:  [97.7 F (36.5 C)-100.5 F (38.1 C)] 97.7 F (36.5 C) (10/30 0800) Resp:  [17-25] 19 (10/30 0800) BP: (106-147)/(47-90) 142/65 (10/30 0800) SpO2:  [96 %-100 %] 100 % (10/30 0800) Weight:  [57.4 kg (126 lb 8.7 oz)] 57.4 kg (126 lb 8.7 oz) (10/30 0500)   General appearance: alert, cooperative and no distress Resp: clear to auscultation bilaterally Chest wall: no tenderness, wound packed, clean.  Cardio: regular rate and rhythm GI: normal findings: bowel sounds normal and soft, non-tender Extremities: edema none  Lab Results  Recent Labs  12/10/16 0328 12/11/16 0339  WBC 11.0* 14.0*  HGB 8.4* 8.8*  HCT 24.9* 26.7*  NA 137 138  K 3.5 4.5  CL 92* 95*  CO2 35* 35*  BUN 11 11  CREATININE 0.63 0.61   Liver Panel No results for input(s): PROT, ALBUMIN, AST, ALT, ALKPHOS, BILITOT, BILIDIR, IBILI in the last 72  hours. Sedimentation Rate No results for input(s): ESRSEDRATE in the last 72 hours. C-Reactive Protein No results for input(s): CRP in the last 72 hours.  Microbiology: Recent Results (from the past 240 hour(s))  Culture, blood (Routine x 2)     Status: Abnormal   Collection Time: 11/18/2016  6:37 PM  Result Value Ref Range Status   Specimen Description BLOOD RIGHT ANTECUBITAL  Final   Special Requests   Final    BOTTLES DRAWN AEROBIC AND ANAEROBIC Blood Culture adequate volume   Culture  Setup Time   Final    GRAM POSITIVE COCCI IN BOTH AEROBIC AND ANAEROBIC BOTTLES CRITICAL RESULT CALLED TO, READ BACK BY AND VERIFIED WITH: M. Chanetta Marshall.D. 10:55 12/05/16 ([email or web address]) Performed at [facility], [place] [address], [place], [place] [address]    Culture  STAPHYLOCOCCUS AUREUS (A)  Final   Report Status 12/07/2016 FINAL  Final   Organism ID, Bacteria STAPHYLOCOCCUS AUREUS  Final      Susceptibility   Staphylococcus aureus - MIC*    CIPROFLOXACIN <=0.5 SENSITIVE Sensitive     ERYTHROMYCIN <=0.25 SENSITIVE Sensitive     GENTAMICIN <=0.5 SENSITIVE Sensitive     OXACILLIN 0.5 SENSITIVE Sensitive     TETRACYCLINE <=1 SENSITIVE Sensitive     VANCOMYCIN 1 SENSITIVE Sensitive     TRIMETH/SULFA <=10 SENSITIVE Sensitive     CLINDAMYCIN <=0.25 SENSITIVE Sensitive     RIFAMPIN <=0.5 SENSITIVE Sensitive     Inducible Clindamycin NEGATIVE Sensitive     * STAPHYLOCOCCUS AUREUS  Blood Culture ID Panel (Reflexed)     Status: Abnormal   Collection Time: 11/24/2016  6:37 PM  Result Value Ref Range Status   Enterococcus species NOT DETECTED NOT DETECTED Final   Listeria monocytogenes NOT DETECTED NOT DETECTED Final   Staphylococcus species DETECTED (A) NOT DETECTED Final    Comment: CRITICAL RESULT CALLED TO, READ BACK BY AND VERIFIED WITH: M. Chanetta Marshall.D. 10:55 12/05/16 ([email or web address])    Staphylococcus aureus DETECTED (A) NOT DETECTED Final    Comment: Methicillin (oxacillin) susceptible Staphylococcus aureus (MSSA). Preferred therapy is anti staphylococcal beta lactam antibiotic (Cefazolin or Nafcillin), unless clinically contraindicated. CRITICAL RESULT CALLED TO, READ BACK BY AND VERIFIED WITH: M. Lear Ng Pharm.D. 10:55 12/05/16 ([email or web address])    Methicillin resistance NOT DETECTED NOT DETECTED Final   Streptococcus species NOT DETECTED NOT DETECTED Final   Streptococcus agalactiae NOT DETECTED NOT DETECTED Final   Streptococcus pneumoniae NOT DETECTED NOT DETECTED Final   Streptococcus pyogenes NOT DETECTED NOT DETECTED Final   Acinetobacter baumannii NOT DETECTED NOT DETECTED Final   Enterobacteriaceae species NOT DETECTED NOT DETECTED Final   Enterobacter cloacae complex NOT DETECTED NOT DETECTED Final   Escherichia coli NOT DETECTED NOT DETECTED Final    Klebsiella oxytoca NOT DETECTED NOT DETECTED Final   Klebsiella pneumoniae NOT DETECTED NOT DETECTED Final   Proteus species NOT DETECTED NOT DETECTED Final   Serratia marcescens NOT DETECTED NOT DETECTED Final   Haemophilus influenzae NOT DETECTED NOT DETECTED Final   Neisseria meningitidis NOT DETECTED NOT DETECTED Final   Pseudomonas aeruginosa NOT DETECTED NOT DETECTED Final   Candida albicans NOT DETECTED NOT DETECTED Final   Candida glabrata NOT DETECTED NOT DETECTED Final   Candida krusei NOT DETECTED NOT DETECTED Final   Candida parapsilosis NOT DETECTED NOT DETECTED Final   Candida tropicalis NOT DETECTED NOT DETECTED Final    Comment: Performed at [facility], [address] [place], [place] [address]  Culture, blood (Routine x  2)     Status: Abnormal   Collection Time: 11/25/2016  6:40 PM  Result Value Ref Range Status   Specimen Description BLOOD RIGHT FOREARM  Final   Special Requests   Final    BOTTLES DRAWN AEROBIC AND ANAEROBIC Blood Culture adequate volume   Culture  Setup Time   Final    GRAM POSITIVE COCCI IN BOTH AEROBIC AND ANAEROBIC BOTTLES CRITICAL VALUE NOTED.  VALUE IS CONSISTENT WITH PREVIOUSLY REPORTED AND CALLED VALUE.    Culture (A)  Final    STAPHYLOCOCCUS AUREUS SUSCEPTIBILITIES PERFORMED ON PREVIOUS CULTURE WITHIN THE LAST 5 DAYS. Performed at [facility], [place] [address] [place], [place] [address]    Report Status 12/07/2016 FINAL  Final  Culture, sputum-assessment     Status: None   Collection Time: 12/01/2016 11:40 PM  Result Value Ref Range Status   Specimen Description SPUTUM  Final   Special Requests Immunocompromised  Final   Sputum evaluation THIS SPECIMEN IS ACCEPTABLE FOR SPUTUM CULTURE  Final   Report Status 12/05/2016 FINAL  Final  Culture, respiratory (NON-Expectorated)     Status: None   Collection Time: 12/05/2016 11:40 PM  Result Value Ref Range Status   Specimen Description SPUTUM  Final   Special Requests  Immunocompromised Reflexed from [id number]  Final   Gram Stain   Final    RARE WBC PRESENT,BOTH PMN AND MONONUCLEAR ABUNDANT SQUAMOUS EPITHELIAL CELLS PRESENT MODERATE GRAM POSITIVE COCCI MODERATE GRAM POSITIVE RODS MODERATE GRAM NEGATIVE RODS    Culture   Final    MODERATE Consistent with normal respiratory flora. Performed at [facility], [place] [address], [place], [place] [address]    Report Status 12/08/2016 FINAL  Final  Rapid strep screen     Status: None   Collection Time: 12/05/16  4:04 PM  Result Value Ref Range Status   Streptococcus, Group A Screen (Direct) NEGATIVE NEGATIVE Final    Comment: (NOTE) A Rapid Antigen test may result negative if the antigen level in the sample is below the detection level of this test. The FDA has not cleared this test as a stand-alone test therefore the rapid antigen negative result has reflexed to a Group A Strep culture.   Culture, group A strep     Status: None   Collection Time: 12/05/16  4:04 PM  Result Value Ref Range Status   Specimen Description THROAT  Final   Special Requests NONE Reflexed from [id number]  Final   Culture   Final    NO GROUP A STREP (S.PYOGENES) ISOLATED Performed at [facility], [address], [place], [place] [address]    Report Status 12/08/2016 FINAL  Final  MRSA PCR Screening     Status: None   Collection Time: 12/05/16  8:20 PM  Result Value Ref Range Status   MRSA by PCR NEGATIVE NEGATIVE Final    Comment:        The GeneXpert MRSA Assay (FDA approved for NASAL specimens only), is one component of a comprehensive MRSA colonization surveillance program. It is not intended to diagnose MRSA infection nor to guide or monitor treatment for MRSA infections.   Culture, blood (Routine X 2) w Reflex to ID Panel     Status: None   Collection Time: 12/06/16  4:05 AM  Result Value Ref Range Status   Specimen Description BLOOD LEFT HAND  Final   Special Requests   Final    BOTTLES DRAWN AEROBIC  AND ANAEROBIC Blood  Culture adequate volume   Culture   Final    NO GROWTH 5 DAYS Performed at [facility], [place] [address] [place], [place] [address]    Report Status 12/11/2016 FINAL  Final  Aerobic/Anaerobic Culture (surgical/deep wound)     Status: None   Collection Time: 12/06/16 12:41 PM  Result Value Ref Range Status   Specimen Description WOUND RIGHT CHEST  Final   Special Requests Normal  Final   Gram Stain   Final    ABUNDANT WBC PRESENT, PREDOMINANTLY PMN ABUNDANT GRAM POSITIVE COCCI IN PAIRS    Culture   Final    ABUNDANT STAPHYLOCOCCUS AUREUS NO ANAEROBES ISOLATED Performed at [facility], [place] [address] [place], [place] [address]    Report Status 12/11/2016 FINAL  Final   Organism ID, Bacteria STAPHYLOCOCCUS AUREUS  Final      Susceptibility   Staphylococcus aureus - MIC*    CIPROFLOXACIN <=0.5 SENSITIVE Sensitive     ERYTHROMYCIN <=0.25 SENSITIVE Sensitive     GENTAMICIN <=0.5 SENSITIVE Sensitive     OXACILLIN 0.5 SENSITIVE Sensitive     TETRACYCLINE <=1 SENSITIVE Sensitive     VANCOMYCIN <=0.5 SENSITIVE Sensitive     TRIMETH/SULFA <=10 SENSITIVE Sensitive     CLINDAMYCIN <=0.25 SENSITIVE Sensitive     RIFAMPIN <=0.5 SENSITIVE Sensitive     Inducible Clindamycin NEGATIVE Sensitive     * ABUNDANT STAPHYLOCOCCUS AUREUS  Body fluid culture     Status: None   Collection Time: 12/07/16 10:31 AM  Result Value Ref Range Status   Specimen Description PLEURAL RIGHT  Final   Special Requests Immunocompromised  Final   Gram Stain   Final    ABUNDANT WBC PRESENT,BOTH PMN AND MONONUCLEAR NO ORGANISMS SEEN    Culture   Final    NO GROWTH 3 DAYS Performed at [facility], [place] [address], [place], [place] [address]    Report Status 12/10/2016 FINAL  Final    Studies/Results: Dg Chest Port 1 View  Result Date: 12/11/2016 CLINICAL DATA:  Pleural effusion, shortness of Breath EXAM: PORTABLE CHEST 1 VIEW COMPARISON:  12/09/2016 FINDINGS:  Cardiomegaly. Small right pleural effusion with vascular congestion. Continued right lower lobe atelectasis or infiltrate and minimal left base atelectasis. No acute bony abnormality. IMPRESSION: Stable small right pleural effusion. Continued right lower lobe atelectasis or infiltrate which appears slightly worsened since prior study. Minimal left base atelectasis. Electronically Signed   By: Rolm Baptise M.D.   On: 12/11/2016 09:36     Assessment/Plan: Infected port Methicillin sensitive Staph aureus bacteremia ? Empyema Supraglottic Squamous Cell cancer dx 05-2016 XRT Cisplatinin 06-2016 to 07-2016 Carboplatinin-Taxol 11-17-16 --> Has been eval by palliative COPD Sickle Trait  Polysubstance abuse (crack, marijuana, ETOH) HIV (-) 05-2016 Hypokalemia  Total days of antibiotics: 8ancef   CXR slightly worse He is asx with regards to this.  Afeb since 10-28and BP improved.  WBC increasing Repeat BCx (sent 10-24) negative.  Consider placing PIC? Cx of port site MSSA Pleural fluid Cx negative.  TTE does not specifically mention valve vegetations. my appreciation to CV for comment on TEE.  Appreciate IR removal of port Appreciate onc notes on care plan.  I would aim for 28 days of ancef          Bobby Rumpf MD, FACP Infectious Diseases (pager) [phone] [email or web address] 12/12/2016, 11:03 AM  LOS: 8 days

## 2016-12-12 NOTE — Progress Notes (Signed)
  Echocardiogram 2D Echocardiogram has been performed.  Antonio [name] 12/12/2016, 4:10 PM

## 2016-12-12 NOTE — Progress Notes (Signed)
LCSW received consult for: SNF placement  Currently recommendation is for home health per PT and OT documentation.  Ambulated 150 ft with a rolling walker. This recommendation will not qualify for insurance to pay for SNF.  If going to nursing home, patient would have to pay out of pocket, 30 days upfront for placement.  If recommendations change or weaknesses increases, LCSW will be available for discharge planning. At this time will defer consult as recommendation is home health and patient asking to go home.  Will be available as needs arise.  Lane Hacker, MSW Clinical Social Work: [employer] Coverage for :  [phone]

## 2016-12-12 NOTE — Progress Notes (Signed)
Antonio [name]   DOB:1955-10-17   LK#:[id number]    Subjective: He feels well.  Pain is stable.  He is still noted to be tachycardic and hypotensive.  He has been afebrile for 24 hours.  He has mild progressive leukocytosis.  Echocardiogram is pending  Objective:  Vitals:   12/12/16 1200 12/12/16 1400  BP: (!) 106/50 (!) 96/59  Pulse:    Resp: (!) 24 16  Temp: 100 F (37.8 C)   SpO2: 95% 100%     Intake/Output Summary (Last 24 hours) at 12/12/16 1612 Last data filed at 12/12/16 1436  Gross per 24 hour  Intake             2348 ml  Output             2600 ml  Net             -252 ml    GENERAL:alert, no distress and comfortable SKIN: skin color, texture, turgor are normal, no rashes or significant lesions EYES: normal, Conjunctiva are pink and non-injected, sclera clear Musculoskeletal:no cyanosis of digits and no clubbing  NEURO: alert & oriented x 3 with fluent speech, no focal motor/sensory deficits   Labs:  Lab Results  Component Value Date   WBC 14.0 (H) 12/11/2016   HGB 8.8 (L) 12/11/2016   HCT 26.7 (L) 12/11/2016   MCV 81.4 12/11/2016   PLT 279 12/11/2016   NEUTROABS 2.1 12/06/2016    Lab Results  Component Value Date   NA 138 12/11/2016   K 4.5 12/11/2016   CL 95 (L) 12/11/2016   CO2 35 (H) 12/11/2016    Studies:  Dg Chest Port 1 View  Result Date: 12/11/2016 CLINICAL DATA:  Pleural effusion, shortness of Breath EXAM: PORTABLE CHEST 1 VIEW COMPARISON:  12/09/2016 FINDINGS: Cardiomegaly. Small right pleural effusion with vascular congestion. Continued right lower lobe atelectasis or infiltrate and minimal left base atelectasis. No acute bony abnormality. IMPRESSION: Stable small right pleural effusion. Continued right lower lobe atelectasis or infiltrate which appears slightly worsened since prior study. Minimal left base atelectasis. Electronically Signed   By: Rolm Baptise M.D.   On: 12/11/2016 09:36    Assessment & Plan:   Metastatic supraglottic cancer  with lung metastasis The patient presented with sepsis secondary to bacteremia and/or pneumonia We will hold chemotherapy and provide supportive care for now It is not clear to me that he will be strong enough to undergo chemotherapy in the future.  Acquired pancytopenia due to chemo The patient is symptomatic He had received 1 unit of blood transfusion on December 05, 2016 He had received G-CSF support last week  MSSA bacteremia Continue IV antibiotic therapy It appears he needs about 4 weeks of antibiotic treatment I will cancel his chemotherapy scheduled for November  Recent falls Consult PT  Moderate protein calorie malnutrition Resume tube feeding  Cancer associated pain He will continue to take morphine sulfate as needed  Discharge planning He is not ready to be discharged The patient is tachycardic and weak I felt that it is prudent to support him with ongoing IV antibiotics and to consider skilled nursing facility placement afterwards for rehab If he does not improve with aggressive supportive care, he may be transitioned to palliative care/hospice I will continue to follow   [name], MD 12/12/2016  4:12 PM

## 2016-12-13 DIAGNOSIS — C3431 Malignant neoplasm of lower lobe, right bronchus or lung: Secondary | ICD-10-CM

## 2016-12-13 DIAGNOSIS — L02213 Cutaneous abscess of chest wall: Secondary | ICD-10-CM

## 2016-12-13 LAB — COMPREHENSIVE METABOLIC PANEL
ALBUMIN: 1.8 g/dL — AB (ref 3.5–5.0)
ALK PHOS: 141 U/L — AB (ref 38–126)
ALT: 40 U/L (ref 17–63)
AST: 58 U/L — AB (ref 15–41)
Anion gap: 11 (ref 5–15)
BILIRUBIN TOTAL: 0.7 mg/dL (ref 0.3–1.2)
BUN: 11 mg/dL (ref 6–20)
CO2: 32 mmol/L (ref 22–32)
Calcium: 9.5 mg/dL (ref 8.9–10.3)
Chloride: 93 mmol/L — ABNORMAL LOW (ref 101–111)
Creatinine, Ser: 0.55 mg/dL — ABNORMAL LOW (ref 0.61–1.24)
GFR calc Af Amer: >60 mL/min (ref >60)
GFR calc non Af Amer: >60 mL/min (ref >60)
GLUCOSE: 93 mg/dL (ref 65–99)
POTASSIUM: 4.1 mmol/L (ref 3.5–5.1)
Sodium: 136 mmol/L (ref 135–145)
TOTAL PROTEIN: 6 g/dL — AB (ref 6.5–8.1)

## 2016-12-13 LAB — GLUCOSE, CAPILLARY
GLUCOSE-CAPILLARY: 106 mg/dL — AB (ref 65–99)
Glucose-Capillary: 118 mg/dL — ABNORMAL HIGH (ref 65–99)
Glucose-Capillary: 129 mg/dL — ABNORMAL HIGH (ref 65–99)
Glucose-Capillary: 124 mg/dL — ABNORMAL HIGH (ref 65–99)

## 2016-12-13 LAB — CBC
HEMATOCRIT: 26.4 % — AB (ref 39.0–52.0)
HEMOGLOBIN: 8.5 g/dL — AB (ref 13.0–17.0)
MCH: 26.2 pg (ref 26.0–34.0)
MCHC: 32.2 g/dL (ref 30.0–36.0)
MCV: 81.5 fL (ref 78.0–100.0)
Platelets: 335 10*3/uL (ref 150–400)
RBC: 3.24 MIL/uL — AB (ref 4.22–5.81)
RDW: 17 % — AB (ref 11.5–15.5)
WBC: 17 10*3/uL — AB (ref 4.0–10.5)

## 2016-12-13 MED ORDER — LACTATED RINGERS IV SOLN
INTRAVENOUS | Status: DC
  Administered 2016-12-13 – 2016-12-14 (×2): via INTRAVENOUS

## 2016-12-13 MED ORDER — OSMOLITE 1.5 CAL PO LIQD
474.0000 mL | Freq: Three times a day (TID) | ORAL | Status: DC
Start: 2016-12-13 — End: 2016-12-19
  Administered 2016-12-13 – 2016-12-18 (×16): 474 mL
  Filled 2016-12-13 (×19): qty 474

## 2016-12-13 MED ORDER — METOCLOPRAMIDE HCL 10 MG/10ML PO SOLN
5.0000 mg | Freq: Three times a day (TID) | ORAL | Status: DC
  Administered 2016-12-13 – 2016-12-22 (×25): 5 mg
  Filled 2016-12-13 (×29): qty 10

## 2016-12-13 MED ORDER — OSMOLITE 1.5 CAL PO LIQD
474.0000 mL | Freq: Three times a day (TID) | ORAL | Status: DC
  Filled 2016-12-13: qty 474

## 2016-12-13 NOTE — Progress Notes (Signed)
SATURATION QUALIFICATIONS: (This note is used to comply with regulatory documentation for home oxygen)  Patient Saturations on Room Air at Rest = 91%  Patient Saturations on Room Air while Ambulating = 100 ft 82%  Patient Saturations on 2 Liters of oxygen while Ambulating = 90 %  Please briefly explain why patient needs home oxygen: Patient requires supplemental oxygen to achieve therapeutic levels  Almond Lint, SPTA [facility] [phone]  Rica Koyanagi  PTA [facility]  Acute  Rehab Pager      [phone]

## 2016-12-13 NOTE — Progress Notes (Signed)
At bedside to place PICC explained procedure,.risks and benefits . Patient refusing to sign consent to have line placed . States he will "wait to see."  Explained that he was to have the PICC so he could get IV antibiotics for four weeks. He still refused. Primary RN aware and will notified MD.

## 2016-12-13 NOTE — Progress Notes (Signed)
Physical Therapy Treatment Patient Details Name: Antonio [name] MRN: [id number] DOB: 12/06/1955 Today's Date: 12/13/2016    History of Present Illness  61 y.o. male with medical history significant of supraglottic cancer(s/p resection, chemotherapy, radiation,and PEG followed by [name]), COPD, sickle cell trait; who presents with complaints of generalized body aches, fall. Dx of sepsis due to PNA. Recent admission 10/1-10/7/18 with hypercalcemia.     PT Comments    Ambulated 200 ft with walker with min guard. Sats taken, see below. Required VCs for walker placement during ambulation. Required short rest at 100 ft due to fatigue and decreased o2. Bed mobility required min assist and sit to stand required min guard. Reported an increase in pain in the L knee due to exercise that diminished with rest.   Patient Saturations on Room Air at Rest = 91%  Patient Saturations on Room Air while Ambulating     100 ft    =  82%  Patient Saturations on 2 Liters of oxygen while Ambulating = 90 %    Follow Up Recommendations  Home health PT     Equipment Recommendations  Rolling walker with 5" wheels    Recommendations for Other Services       Precautions / Restrictions Precautions Precautions: Fall Precaution Comments: pt reports having B knee braces at home Restrictions Weight Bearing Restrictions: No    Mobility  Bed Mobility Overal bed mobility: Needs Assistance Bed Mobility: Supine to Sit     Supine to sit: HOB elevated;Min assist        Transfers Overall transfer level: Needs assistance Equipment used: Rolling walker (2 wheeled) Transfers: Sit to/from Stand Sit to Stand: Supervision;Min guard         General transfer comment: VCs for hand placement  Ambulation/Gait Ambulation/Gait assistance: Supervision;Min guard Ambulation Distance (Feet): 200 Feet Assistive device: Rolling walker (2 wheeled) Gait Pattern/deviations: Step-through pattern;Decreased  stride length   Gait velocity interpretation: Below normal speed for age/gender General Gait Details: distance limited by fatigue, B knee valgus, no LOB.    Stairs            Wheelchair Mobility    Modified Rankin (Stroke Patients Only)       Balance                                            Cognition Arousal/Alertness: Awake/alert Behavior During Therapy: Agitated Overall Cognitive Status: Within Functional Limits for tasks assessed                                        Exercises      General Comments        Pertinent Vitals/Pain Pain Assessment: 0-10 Pain Score: 9  Pain Location: L knee with walking  Pain Descriptors / Indicators: Aching;Sore;Discomfort Pain Intervention(s): Monitored during session;Repositioned    Home Living                      Prior Function            PT Goals (current goals can now be found in the care plan section)      Frequency    Min 3X/week      PT Plan      Co-evaluation  AM-PAC PT "6 Clicks" Daily Activity  Outcome Measure  Difficulty turning over in bed (including adjusting bedclothes, sheets and blankets)?: A Little Difficulty moving from lying on back to sitting on the side of the bed? : A Lot Difficulty sitting down on and standing up from a chair with arms (e.g., wheelchair, bedside commode, etc,.)?: A Little Help needed moving to and from a bed to chair (including a wheelchair)?: A Little Help needed walking in hospital room?: A Little Help needed climbing 3-5 steps with a railing? : A Lot 6 Click Score: 16    End of Session Equipment Utilized During Treatment: Gait belt;Oxygen Activity Tolerance: Patient limited by fatigue;Patient limited by pain Patient left: in chair;with call bell/phone within reach;with family/visitor present;with chair alarm set Nurse Communication: Mobility status PT Visit Diagnosis: Difficulty in walking, not  elsewhere classified (R26.2);Muscle weakness (generalized) (M62.81)         Almond Lint, SPTA [facility] [place]  PTA [facility]  Acute  Rehab Pager      [phone]

## 2016-12-13 NOTE — Care Management Important Message (Signed)
Important Message  Patient Details  Name: Lyndell Allaire MRN: [id number] Date of Birth: 1955/12/01   Medicare Important Message Given:  Yes    Kerin Salen 12/13/2016, 10:37 AMImportant Message  Patient Details  Name: Alma Muegge MRN: [id number] Date of Birth: 1955-02-21   Medicare Important Message Given:  Yes    Kerin Salen 12/13/2016, 10:37 AM

## 2016-12-13 NOTE — Consult Note (Signed)
   [facility] CM Inpatient Consult   12/13/2016  Antonio [name] [birth year] [id number]    [place] Management follow up.   Went to bedside to speak with [name]. He was sleeping soundly.   Spoke with inpatient RNCM to make aware [place] Management to follow post discharge.   Spoke with [place] to discuss patient.   Marthenia Rolling, MSN-Ed, RN,BSN [facility] Liaison [phone]

## 2016-12-13 NOTE — Progress Notes (Addendum)
Pt continues to state that he is going home, that no one is listening to him, he is going home to take care of things, because he had to have some where to live. Pt states that he had [place] and will continue at home. Pt is non complaint at home with medications/sister and MD.  Pt and sister at bedside to discuss disposition. Will continue to follow.

## 2016-12-13 NOTE — Progress Notes (Signed)
RN was informed that Pt refusing to sign consent for PICC line placement. RN discussed with Pt regarding placement of PICC line. MD had rounded earlier in the morning and did discuss with Pt regarding need for the placement of the PICC line for long term IV Antibiotics. Pt states "I don't remember that and I don't want another in my chest" RN discussed that PICC lines are placed in the arm. Pt states "he will wait and see". MD updated via phone and will speak to Pt later this afternoon.

## 2016-12-13 NOTE — Progress Notes (Signed)
INFECTIOUS DISEASE PROGRESS NOTE  ID: Lang Zingg is a 61 y.o. 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   Abtx:  Anti-infectives    Start     Dose/Rate Route Frequency Ordered Stop   12/06/16 1000  vancomycin (VANCOCIN) IVPB 750 mg/150 ml premix  Status:  Discontinued     750 mg 150 mL/hr over 60 Minutes Intravenous Every 12 hours 12/05/16 0008 12/05/16 0009   12/05/16 1800  ceFAZolin (ANCEF) IVPB 2g/100 mL premix     2 g 200 mL/hr over 30 Minutes Intravenous Every 8 hours 12/05/16 1122     12/05/16 1000  vancomycin (VANCOCIN) IVPB 750 mg/150 ml premix  Status:  Discontinued     750 mg 150 mL/hr over 60 Minutes Intravenous Every 12 hours 12/05/16 0009 12/05/16 1118   12/05/16 0200  ceFEPIme (MAXIPIME) 1 g in dextrose 5 % 50 mL IVPB  Status:  Discontinued     1 g 100 mL/hr over 30 Minutes Intravenous Every 8 hours 12/05/16 0009 12/05/16 1118   11/19/2016 1930  vancomycin (VANCOCIN) IVPB 1000 mg/200 mL premix     1,000 mg 200 mL/hr over 60 Minutes Intravenous  Once 11/26/2016 1915 11/27/2016 2238   11/24/2016 1930  piperacillin-tazobactam (ZOSYN) IVPB 3.375 g     3.375 g 100 mL/hr over 30 Minutes Intravenous  Once 12/02/2016 1915 12/11/2016 2001      Medications:  Scheduled: . chlorhexidine  15 mL Mouth Rinse BID  . enoxaparin (LOVENOX) injection  40 mg Subcutaneous Q24H  . feeding supplement (OSMOLITE 1.5 CAL)  474 mL Per Tube TID WC  . free water  100 mL Per  Tube Q4H  . gabapentin  300 mg Per Tube TID  . mouth rinse  15 mL Mouth Rinse q12n4p  . metoCLOPramide  5 mg Per Tube TID AC  . metoprolol tartrate  50 mg Oral BID  . pantoprazole (PROTONIX) IV  40 mg Intravenous Q12H  . senna-docusate  1 tablet Oral BID    Objective: Vital signs in last 24 hours: Temp:  [97.6 F (36.4 C)-99.8 F (37.7 C)] 97.6 F (36.4 C) (10/31 1350) Pulse Rate:  [110-125] 110 (10/31 1350) Resp:  [18-28] 18 (10/31 1350) BP: (105-138)/(63-86) 132/76 (10/31 1350) SpO2:  [95 %-99 %] 95 % (10/31 1350) Weight:  [55.1 kg (121 lb 7.6 oz)-55.2 kg (121 lb 11.1 oz)] 55.1 kg (121 lb 7.6 oz) (10/31 0517)   General appearance: alert, cooperative and no distress Resp: clear to auscultation bilaterally Chest wall: no tenderness, wound clean, no d/c.  Cardio: regular rate and rhythm GI: normal findings: bowel sounds normal and soft, non-tender  Lab Results  Recent Labs  12/11/16 0339 12/13/16 0430  WBC 14.0* 17.0*  HGB 8.8* 8.5*  HCT 26.7* 26.4*  NA 138 136  K 4.5 4.1  CL 95* 93*  CO2 35* 32  BUN 11 11  CREATININE 0.61 0.55*   Liver Panel  Recent  Labs  12/13/16 0430  PROT 6.0*  ALBUMIN 1.8*  AST 58*  ALT 40  ALKPHOS 141*  BILITOT 0.7   Sedimentation Rate No results for input(s): ESRSEDRATE in the last 72 hours. C-Reactive Protein No results for input(s): CRP in the last 72 hours.  Microbiology: Recent Results (from the past 240 hour(s))  Culture, blood (Routine x 2)     Status: Abnormal   Collection Time: 12/05/2016  6:37 PM  Result Value Ref Range Status   Specimen Description BLOOD RIGHT ANTECUBITAL  Final   Special Requests   Final    BOTTLES DRAWN AEROBIC AND ANAEROBIC Blood Culture adequate volume   Culture  Setup Time   Final    GRAM POSITIVE COCCI IN BOTH AEROBIC AND ANAEROBIC BOTTLES CRITICAL RESULT CALLED TO, READ BACK BY AND VERIFIED WITH: M. Chanetta Marshall.D. 10:55 12/05/16 ([email or web address]) Performed at [facility], [place] [address], [place],  [address]    Culture STAPHYLOCOCCUS AUREUS (A)  Final   Report Status 12/07/2016 FINAL  Final   Organism ID, Bacteria STAPHYLOCOCCUS AUREUS  Final      Susceptibility   Staphylococcus aureus - MIC*    CIPROFLOXACIN <=0.5 SENSITIVE Sensitive     ERYTHROMYCIN <=0.25 SENSITIVE Sensitive     GENTAMICIN <=0.5 SENSITIVE Sensitive     OXACILLIN 0.5 SENSITIVE Sensitive     TETRACYCLINE <=1 SENSITIVE Sensitive     VANCOMYCIN 1 SENSITIVE Sensitive     TRIMETH/SULFA <=10 SENSITIVE Sensitive     CLINDAMYCIN <=0.25 SENSITIVE Sensitive     RIFAMPIN <=0.5 SENSITIVE Sensitive     Inducible Clindamycin NEGATIVE Sensitive     * STAPHYLOCOCCUS AUREUS  Blood Culture ID Panel (Reflexed)     Status: Abnormal   Collection Time: 11/16/2016  6:37 PM  Result Value Ref Range Status   Enterococcus species NOT DETECTED NOT DETECTED Final   Listeria monocytogenes NOT DETECTED NOT DETECTED Final   Staphylococcus species DETECTED (A) NOT DETECTED Final    Comment: CRITICAL RESULT CALLED TO, READ BACK BY AND VERIFIED WITH: M. Chanetta Marshall.D. 10:55 12/05/16 ([email or web address])    Staphylococcus aureus DETECTED (A) NOT DETECTED Final    Comment: Methicillin (oxacillin) susceptible Staphylococcus aureus (MSSA). Preferred therapy is anti staphylococcal beta lactam antibiotic (Cefazolin or Nafcillin), unless clinically contraindicated. CRITICAL RESULT CALLED TO, READ BACK BY AND VERIFIED WITH: M. Lear Ng Pharm.D. 10:55 12/05/16 ([email or web address])    Methicillin resistance NOT DETECTED NOT DETECTED Final   Streptococcus species NOT DETECTED NOT DETECTED Final   Streptococcus agalactiae NOT DETECTED NOT DETECTED Final   Streptococcus pneumoniae NOT DETECTED NOT DETECTED Final   Streptococcus pyogenes NOT DETECTED NOT DETECTED Final   Acinetobacter baumannii NOT DETECTED NOT DETECTED Final   Enterobacteriaceae species NOT DETECTED NOT DETECTED Final   Enterobacter cloacae complex NOT DETECTED NOT DETECTED Final   Escherichia  coli NOT DETECTED NOT DETECTED Final   Klebsiella oxytoca NOT DETECTED NOT DETECTED Final   Klebsiella pneumoniae NOT DETECTED NOT DETECTED Final   Proteus species NOT DETECTED NOT DETECTED Final   Serratia marcescens NOT DETECTED NOT DETECTED Final   Haemophilus influenzae NOT DETECTED NOT DETECTED Final   Neisseria meningitidis NOT DETECTED NOT DETECTED Final   Pseudomonas aeruginosa NOT DETECTED NOT DETECTED Final   Candida albicans NOT DETECTED NOT DETECTED Final   Candida glabrata NOT DETECTED NOT DETECTED Final   Candida krusei NOT DETECTED NOT DETECTED Final   Candida parapsilosis NOT DETECTED NOT DETECTED Final   Candida tropicalis NOT DETECTED NOT DETECTED  Final    Comment: Performed at [facility], [place] [address] [place], [place] [address]  Culture, blood (Routine x 2)     Status: Abnormal   Collection Time: 11/24/2016  6:40 PM  Result Value Ref Range Status   Specimen Description BLOOD RIGHT FOREARM  Final   Special Requests   Final    BOTTLES DRAWN AEROBIC AND ANAEROBIC Blood Culture adequate volume   Culture  Setup Time   Final    GRAM POSITIVE COCCI IN BOTH AEROBIC AND ANAEROBIC BOTTLES CRITICAL VALUE NOTED.  VALUE IS CONSISTENT WITH PREVIOUSLY REPORTED AND CALLED VALUE.    Culture (A)  Final    STAPHYLOCOCCUS AUREUS SUSCEPTIBILITIES PERFORMED ON PREVIOUS CULTURE WITHIN THE LAST 5 DAYS. Performed at [facility], [place] [address] [place], [place] [address]    Report Status 12/07/2016 FINAL  Final  Culture, sputum-assessment     Status: None   Collection Time: 12/05/2016 11:40 PM  Result Value Ref Range Status   Specimen Description SPUTUM  Final   Special Requests Immunocompromised  Final   Sputum evaluation THIS SPECIMEN IS ACCEPTABLE FOR SPUTUM CULTURE  Final   Report Status 12/05/2016 FINAL  Final  Culture, respiratory (NON-Expectorated)     Status: None   Collection Time: 11/14/2016 11:40 PM  Result Value Ref Range Status   Specimen Description SPUTUM   Final   Special Requests Immunocompromised Reflexed from [id number]  Final   Gram Stain   Final    RARE WBC PRESENT,BOTH PMN AND MONONUCLEAR ABUNDANT SQUAMOUS EPITHELIAL CELLS PRESENT MODERATE GRAM POSITIVE COCCI MODERATE GRAM POSITIVE RODS MODERATE GRAM NEGATIVE RODS    Culture   Final    MODERATE Consistent with normal respiratory flora. Performed at [facility], [place] [address] [place], [place] [address]    Report Status 12/08/2016 FINAL  Final  Rapid strep screen     Status: None   Collection Time: 12/05/16  4:04 PM  Result Value Ref Range Status   Streptococcus, Group A Screen (Direct) NEGATIVE NEGATIVE Final    Comment: (NOTE) A Rapid Antigen test may result negative if the antigen level in the sample is below the detection level of this test. The FDA has not cleared this test as a stand-alone test therefore the rapid antigen negative result has reflexed to a Group A Strep culture.   Culture, group A strep     Status: None   Collection Time: 12/05/16  4:04 PM  Result Value Ref Range Status   Specimen Description THROAT  Final   Special Requests NONE Reflexed from [id number]  Final   Culture   Final    NO GROUP A STREP (S.PYOGENES) ISOLATED Performed at [facility], [address] [place], [place] [address]    Report Status 12/08/2016 FINAL  Final  MRSA PCR Screening     Status: None   Collection Time: 12/05/16  8:20 PM  Result Value Ref Range Status   MRSA by PCR NEGATIVE NEGATIVE Final    Comment:        The GeneXpert MRSA Assay (FDA approved for NASAL specimens only), is one component of a comprehensive MRSA colonization surveillance program. It is not intended to diagnose MRSA infection nor to guide or monitor treatment for MRSA infections.   Culture, blood (Routine X 2) w Reflex to ID Panel     Status: None   Collection Time: 12/06/16  4:05 AM  Result Value Ref Range Status   Specimen  Description BLOOD LEFT HAND  Final   Special Requests   Final      BOTTLES DRAWN AEROBIC AND ANAEROBIC Blood Culture adequate volume   Culture   Final    NO GROWTH 5 DAYS Performed at [facility], [address], [place], [place] [address]    Report Status 12/11/2016 FINAL  Final  Aerobic/Anaerobic Culture (surgical/deep wound)     Status: None   Collection Time: 12/06/16 12:41 PM  Result Value Ref Range Status   Specimen Description WOUND RIGHT CHEST  Final   Special Requests Normal  Final   Gram Stain   Final    ABUNDANT WBC PRESENT, PREDOMINANTLY PMN ABUNDANT GRAM POSITIVE COCCI IN PAIRS    Culture   Final    ABUNDANT STAPHYLOCOCCUS AUREUS NO ANAEROBES ISOLATED Performed at [facility], [place] [address], [place], [place] [address]    Report Status 12/11/2016 FINAL  Final   Organism ID, Bacteria STAPHYLOCOCCUS AUREUS  Final      Susceptibility   Staphylococcus aureus - MIC*    CIPROFLOXACIN <=0.5 SENSITIVE Sensitive     ERYTHROMYCIN <=0.25 SENSITIVE Sensitive     GENTAMICIN <=0.5 SENSITIVE Sensitive     OXACILLIN 0.5 SENSITIVE Sensitive     TETRACYCLINE <=1 SENSITIVE Sensitive     VANCOMYCIN <=0.5 SENSITIVE Sensitive     TRIMETH/SULFA <=10 SENSITIVE Sensitive     CLINDAMYCIN <=0.25 SENSITIVE Sensitive     RIFAMPIN <=0.5 SENSITIVE Sensitive     Inducible Clindamycin NEGATIVE Sensitive     * ABUNDANT STAPHYLOCOCCUS AUREUS  Body fluid culture     Status: None   Collection Time: 12/07/16 10:31 AM  Result Value Ref Range Status   Specimen Description PLEURAL RIGHT  Final   Special Requests Immunocompromised  Final   Gram Stain   Final    ABUNDANT WBC PRESENT,BOTH PMN AND MONONUCLEAR NO ORGANISMS SEEN    Culture   Final    NO GROWTH 3 DAYS Performed at [facility], [place] [address] [place], [place] [address]    Report Status 12/10/2016 FINAL  Final    Studies/Results: No results found.   Assessment/Plan: Infected port Methicillin sensitive Staph aureus bacteremia ? 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  Afeb since 10-28and BP improved.  WBC increasing still Repeat BCx (sent 10-24) negative.  Consider placing PIC when he allows Cx of port site MSSA Pleural fluid Cx negative.  TTE does not specifically mention valve vegetations. my appreciation to CV for comment on TEE.  Appreciate IR removal of port Appreciate onc notes on care plan.  I would aim for 28 days of ancef. If he refuses PIC, would let him complete rx with keflex. I let him know that this is suboptimal.  If his WBC continues to rise, would consider imaging his chest (CT).  Available as needed.          Bobby Rumpf MD, FACP Infectious Diseases (pager) [phone] [email or web address] 12/13/2016, 3:07 PM  LOS: 9 days

## 2016-12-13 NOTE — Progress Notes (Signed)
Antonio [name]   DOB:August 16, 1955   KD#:[id number]    Subjective: The patient has been transferred out of ICU.  He denies significant pain.  He has been afebrile.  He complain of postprandial vomiting after feeding  Objective:  Vitals:   12/12/16 2004 12/13/16 0517  BP: 138/86 133/71  Pulse: (!) 121 (!) 125  Resp: (!) 24 (!) 24  Temp: 98.4 F (36.9 C) 99.3 F (37.4 C)  SpO2: 99% 95%     Intake/Output Summary (Last 24 hours) at 12/13/16 0807 Last data filed at 12/13/16 0753  Gross per 24 hour  Intake             4740 ml  Output             1875 ml  Net             2865 ml    GENERAL:alert, no distress and comfortable.  He looks thin and cachectic SKIN: skin color, texture, turgor are normal, no rashes or significant lesions.  His chest wall appears to be healing well EYES: normal, Conjunctiva are pink and non-injected, sclera clear OROPHARYNX:no exudate, no erythema and lips, buccal mucosa, and tongue normal  NECK: supple, thyroid normal size, non-tender, without nodularity LYMPH:  no palpable lymphadenopathy in the cervical, axillary or inguinal LUNGS: clear to auscultation and percussion with normal breathing effort HEART: Tachycardia, no murmurs ABDOMEN:abdomen soft, non-tender and normal bowel sounds Musculoskeletal:no cyanosis of digits and no clubbing.  Noted muscle wasting NEURO: alert & oriented x 3 with dysarthria, no focal motor/sensory deficits   Labs:  Lab Results  Component Value Date   WBC 17.0 (H) 12/13/2016   HGB 8.5 (L) 12/13/2016   HCT 26.4 (L) 12/13/2016   MCV 81.5 12/13/2016   PLT 335 12/13/2016   NEUTROABS 2.1 12/06/2016    Lab Results  Component Value Date   NA 136 12/13/2016   K 4.1 12/13/2016   CL 93 (L) 12/13/2016   CO2 32 12/13/2016    Studies:  Dg Chest Port 1 View  Result Date: 12/11/2016 CLINICAL DATA:  Pleural effusion, shortness of Breath EXAM: PORTABLE CHEST 1 VIEW COMPARISON:  12/09/2016 FINDINGS: Cardiomegaly. Small right  pleural effusion with vascular congestion. Continued right lower lobe atelectasis or infiltrate and minimal left base atelectasis. No acute bony abnormality. IMPRESSION: Stable small right pleural effusion. Continued right lower lobe atelectasis or infiltrate which appears slightly worsened since prior study. Minimal left base atelectasis. Electronically Signed   By: Rolm Baptise M.D.   On: 12/11/2016 09:36    Assessment & Plan:   Metastatic supraglottic cancer with lung metastasis The patient presented with sepsis secondary to bacteremia and/or pneumonia We will hold chemotherapy and provide supportive care for now It is not clear to me that he will be strong enough to undergo chemotherapy in the future. Per infectious disease team, the patient would need minimum 4 weeks of antibiotic treatment and I would not resume treatment until antibiotic therapy is completed  Acquired pancytopenia due to chemo The patient is symptomatic He had received 1 unit of blood transfusion on December 05, 2016 He had received G-CSF support last week; however, this should not be the cause of his progressive leukocytosis  MSSA bacteremia Continue IV antibiotic therapy It appears he needs about 4 weeks of antibiotic treatment I will cancel his chemotherapy scheduled for November He is noted to have progressive leukocytosis of unknown reason  Recent falls Consult PT  Severe protein calorie malnutrition The patient complained  of postprandial vomiting. Will add scheduled Reglan  Cancer associated pain He will continue to take morphine sulfate as needed  Discharge planning He is not ready to be discharged The patient is tachycardic and weak I felt that it is prudent to support him with ongoing IV antibiotics and to consider skilled nursing facility placement afterwards for rehab If he does not improve with aggressive supportive care, he may be transitioned to palliative care/hospice I will continue to  follow   [name], MD 12/13/2016  8:07 AM

## 2016-12-14 DIAGNOSIS — C770 Secondary and unspecified malignant neoplasm of lymph nodes of head, face and neck: Secondary | ICD-10-CM

## 2016-12-14 LAB — GLUCOSE, CAPILLARY
GLUCOSE-CAPILLARY: 95 mg/dL (ref 65–99)
GLUCOSE-CAPILLARY: 137 mg/dL — AB (ref 65–99)
GLUCOSE-CAPILLARY: 133 mg/dL — AB (ref 65–99)
GLUCOSE-CAPILLARY: 172 mg/dL — AB (ref 65–99)

## 2016-12-14 MED ORDER — SODIUM CHLORIDE 0.9 % IV SOLN
INTRAVENOUS | Status: DC
  Administered 2016-12-14 – 2016-12-22 (×10): via INTRAVENOUS

## 2016-12-14 MED ORDER — SODIUM CHLORIDE 0.9% FLUSH
10.0000 mL | INTRAVENOUS | Status: DC | PRN
  Administered 2016-12-15: 10 mL
  Filled 2016-12-14: qty 40

## 2016-12-14 MED ORDER — FREE WATER
125.0000 mL | Status: DC
  Administered 2016-12-14 – 2016-12-18 (×29): 125 mL

## 2016-12-14 MED ORDER — METOPROLOL TARTRATE 25 MG PO TABS
75.0000 mg | ORAL_TABLET | Freq: Two times a day (BID) | ORAL | Status: DC
  Administered 2016-12-14 – 2016-12-18 (×10): 75 mg via ORAL
  Filled 2016-12-14 (×10): qty 1

## 2016-12-14 NOTE — Progress Notes (Signed)
PROGRESS NOTE  Antonio [name]  BTD:[id number] DOB: 07/16/1955 DOA: 11/25/2016 PCP: Antonio Pile, MD  Outpatient Specialists: Oncology, [name]   Brief Narrative:  Antonio [name] is a 61 y.o. male with a history of supraglottic laryngeal CA s/p resection, chemo/radiation currently on palliative chemotherapy who was brought from home by EMS due to weakness, a fall, and confusion which was reported by his sister. He reports he slipped and fell on urine and denies any confusion. He reported chills and a productive cough to admitting MD. He denied fevers but had a temperature of 103.83F, heart rates 134-141, respirations 18-32, blood pressures 117/57-122/66, O2 saturations 91-96% on room air. Labs revealed WBC 3.7, hemoglobin 8.2, MCV 76.8, RDW 16.4, platelets 184, BUN 23, creatinine 1, albumin 2.5, lactic acid trending upwards to 1.92. Chest x-ray showed right base collapse with small pleural effusion. Urinalysis was negative for any signs of infection. He was given 3L of normal saline, empiric antibiotics and admitted. Blood cultures became positive for MSSA, antibiotics narrowed to ancef per ID recommendations. Pt became hypotensive and was transferred to the stepdown unit with CCM consult, though vitals have stabilized with fluids alone. Port was removed with purulent pocket noted on 10/24 and thoracentesis was performed 10/25 fluid consistent with exudate, pulmonary has seen and signed off.   Continue IV antibiotics as indicated by ID. Will place PICC line and will concretize discharge plan.   Subjective:   Afebrile, denies chest pain, no nausea, no vomiting, no abdominal pain.  Patient continued requiring oxygen supplementation.  Oriented x3 and in no acute distress.  In agreement to go to a nursing home and has already signed consent for PICC line placement.  Assessment & Plan:  Sepsis due to MSSA bacteremia due to port infection  - in a immunocompromised patient, port which was infected  removed on 12/06/2016, he was negative for hepatitis or HIV.   -ID following and on board.  Currently on Ancef since 12/05/2016.  Negative transthoracic echogram for any vegetations, TEE cannot be done due to laryngeal cancer.   -plan is for 18 more days of IV ancef (plan is for a total of 28 days) -repeat blood cx's so far neg -planning to place PICC line later today -continue supportive care.  RLL opacity on imaging, along with right-sided pleural effusion.  CT chest done and thoracentesis obtained by pulmonary on 12/07/2016,could have compressive atelectasis versus pneumonia as per CT findings,fluid appears to be consistent with exudate. -continue current abx's. -no further fever and denies CP. -continue oxygen supplementation.  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 [name] oncology and palliative care following, for now wishes to pursue full code and palliative chemo if needed.   -If declines may transition to palliative care/hospice but not now.  Persistent tachycardia.  In no pain or distress, has been hydrated, stable TSH and echogram, He is on intermittent tube feeds via PEG tube and in between he is n.p.o.  -continue IVF's -HR improving overall -metoprolol increased to 75mg  BID; will follow response -continue hydration.  Dysphagia -s/p PEG May 2018.  -Continue feeds thru PEG tube.  -Ok to give small amount of liquid po for pt comfort after oral care  Pancytopenia  -Suspect due to marrow suppression.   -Required 1 unit of packed RBC transfusion on 12/05/2016 -stable H&H since then.  -continue to monitor CBC intermittently  Fall with generalized weakness  -due to weakness and deconditioning,  -continue supportive care -patient will like to  go home; but with poor social support. And will require continue IV antibiotic therapy for another 18 days or so. -patient in agreement to go to SNF at discharge. SW on  board and aware   History of hypercalcemia of malignancy  -resolved for now after IVF's given -will follow electrolytes trend  -continue free water six times a day through PEG  Peripheral neuropathy  -will continue neurontin    Essential hypertension.  -will continue b-blocker -dose adjusted to 75mg  BID -BP stable -will monitor   DVT prophylaxis: SCDs Code Status: Full Family Communication: sister at bedside Disposition Plan: patient seen and discussed with CM on 10/31; he is currently home alone and probably unable to take care of himself and continue/finish antibiotic therapy as an outpatient.  Once discussed again with the patient he is in agreement to go to nursing home for rehabilitation and further care.  Consultants:    Oncology, [name].  ID, Dr. Johnnye Sima  IR, Dr. Anselm Pancoast  CCM, Dr. Ashok Cordia   Palliative care team  Procedures:    Echocardiogram 12/06/2016:   Left ventricle: The cavity size was normal. Wall thickness was normal. Systolic function was normal. The estimated ejection  fraction was in the range of 55% to 60%. Wall motion was normal;  there were no regional wall motion abnormalities. Doppler  parameters are consistent with abnormal left ventricular  relaxation (grade 1 diastolic dysfunction).  Mitral valve: Calcified annulus.  Impressions: - Normal LV systolic function; mild diastolic dysfunction.   Antimicrobials:   Vancomycin 10/22 > 10/23  Zosyn 10/22  Cefepime 10/22 > 10/23   Ancef 10/23 >>   Objective:  Vitals:   12/13/16 2200 12/14/16 0512 12/14/16 1017 12/14/16 1552  BP: 119/66 (!) 143/70 (!) 137/56 134/69  Pulse: (!) 109 (!) 127 (!) 124 (!) 125  Resp: 18 18 18 16   Temp: 100.1 F (37.8 C) 98.6 F (37 C) 97.8 F (36.6 C) 99.5 F (37.5 C)  TempSrc: Oral Oral Oral Oral  SpO2: 99% 100% 100% 97%  Weight:  54.4 kg (119 lb 14.9 oz)    Height:        Intake/Output Summary (Last 24 hours) at 12/14/16 1802 Last data filed at  12/14/16 1633  Gross per 24 hour  Intake          3875.92 ml  Output             2200 ml  Net          1675.92 ml   Filed Weights   12/13/16 0402 12/13/16 0517 12/14/16 0512  Weight: 55.2 kg (121 lb 11.1 oz) 55.1 kg (121 lb 7.6 oz) 54.4 kg (119 lb 14.9 oz)    Exam General: Alert, awake and oriented x3, following commands and in no acute distress.  No chest pain, no fever, reports no abdominal pain.  Resp: Good air movement, normal respiratory effort, no wheezing, no crackles.  Scattered rhonchi appreciated on exam. Heart: S1 and S2, sinus tachycardia, no rubs, no gallops, no JVD.   Abd: Soft, nontender, nondistended, positive PEG tube in place. Skin: positive dressing on his right chest from recent removed port-a-cath; no drainage or erythema appreciated. Patient with PEG tube in place, site clean; no drainage. Extremities: No edema, no cyanosis, no clubbing.    Data Reviewed: I have personally reviewed following labs and imaging studies  CBC:  Recent Labs Lab 12/08/16 0321 12/09/16 0316 12/10/16 0328 12/11/16 0339 12/13/16 0430  WBC 5.2 7.3 11.0* 14.0* 17.0*  HGB 9.5* 8.1* 8.4* 8.8* 8.5*  HCT 28.0* 23.6* 24.9* 26.7* 26.4*  MCV 78.9 79.2 79.6 81.4 81.5  PLT 173 197 259 279 627   Basic Metabolic Panel:  Recent Labs Lab 12/08/16 0321 12/09/16 0316 12/10/16 0328 12/11/16 0339 12/13/16 0430  NA 140 138 137 138 136  K 3.0* 3.3* 3.5 4.5 4.1  CL 103 97* 92* 95* 93*  CO2 29 31 35* 35* 32  GLUCOSE 108* 121* 105* 100* 93  BUN 12 11 11 11 11   CREATININE 0.65 0.60* 0.63 0.61 0.55*  CALCIUM 8.5* 8.9 9.0 9.2 9.5  MG  --   --  1.7 1.9  --    GFR: Estimated Creatinine Clearance: 75.6 mL/min (A) (by C-G formula based on SCr of 0.55 mg/dL (L)).   Liver Function Tests:  Recent Labs Lab 12/13/16 0430  AST 58*  ALT 40  ALKPHOS 141*  BILITOT 0.7  PROT 6.0*  ALBUMIN 1.8*   CBG:  Recent Labs Lab 12/13/16 2157 12/14/16 0803 12/14/16 1218 12/14/16 1711  12/14/16 1745  GLUCAP 106* 95 137* 133* 172*   Urine analysis:    Component Value Date/Time   COLORURINE YELLOW 11/26/2016 1847   APPEARANCEUR CLEAR 11/19/2016 1847   LABSPEC 1.013 11/24/2016 1847   PHURINE 5.0 11/14/2016 1847   GLUCOSEU NEGATIVE 12/02/2016 1847   HGBUR NEGATIVE 11/13/2016 1847   BILIRUBINUR NEGATIVE 11/25/2016 1847   KETONESUR NEGATIVE 11/27/2016 1847   PROTEINUR NEGATIVE 11/26/2016 1847   NITRITE NEGATIVE 12/05/2016 1847   LEUKOCYTESUR NEGATIVE 11/21/2016 1847   Recent Results (from the past 240 hour(s))  Culture, blood (Routine x 2)     Status: Abnormal   Collection Time: 11/18/2016  6:37 PM  Result Value Ref Range Status   Specimen Description BLOOD RIGHT ANTECUBITAL  Final   Special Requests   Final    BOTTLES DRAWN AEROBIC AND ANAEROBIC Blood Culture adequate volume   Culture  Setup Time   Final    GRAM POSITIVE COCCI IN BOTH AEROBIC AND ANAEROBIC BOTTLES CRITICAL RESULT CALLED TO, READ BACK BY AND VERIFIED WITH: M. Lear Ng Pharm.D. 10:55 12/05/16 ([email or web address]) Performed at [facility], [place] [address] [place], [place] [address]    Culture STAPHYLOCOCCUS AUREUS (A)  Final   Report Status 12/07/2016 FINAL  Final   Organism ID, Bacteria STAPHYLOCOCCUS AUREUS  Final      Susceptibility   Staphylococcus aureus - MIC*    CIPROFLOXACIN <=0.5 SENSITIVE Sensitive     ERYTHROMYCIN <=0.25 SENSITIVE Sensitive     GENTAMICIN <=0.5 SENSITIVE Sensitive     OXACILLIN 0.5 SENSITIVE Sensitive     TETRACYCLINE <=1 SENSITIVE Sensitive     VANCOMYCIN 1 SENSITIVE Sensitive     TRIMETH/SULFA <=10 SENSITIVE Sensitive     CLINDAMYCIN <=0.25 SENSITIVE Sensitive     RIFAMPIN <=0.5 SENSITIVE Sensitive     Inducible Clindamycin NEGATIVE Sensitive     * STAPHYLOCOCCUS AUREUS  Blood Culture ID Panel (Reflexed)     Status: Abnormal   Collection Time: 11/28/2016  6:37 PM  Result Value Ref Range Status   Enterococcus species NOT DETECTED NOT DETECTED Final   Listeria  monocytogenes NOT DETECTED NOT DETECTED Final   Staphylococcus species DETECTED (A) NOT DETECTED Final    Comment: CRITICAL RESULT CALLED TO, READ BACK BY AND VERIFIED WITH: M. Chanetta Marshall.D. 10:55 12/05/16 ([email or web address])    Staphylococcus aureus DETECTED (A) NOT DETECTED Final    Comment: Methicillin (oxacillin) susceptible Staphylococcus aureus (MSSA). Preferred therapy is anti staphylococcal  beta lactam antibiotic (Cefazolin or Nafcillin), unless clinically contraindicated. CRITICAL RESULT CALLED TO, READ BACK BY AND VERIFIED WITH: M. Lear Ng Pharm.D. 10:55 12/05/16 ([email or web address])    Methicillin resistance NOT DETECTED NOT DETECTED Final   Streptococcus species NOT DETECTED NOT DETECTED Final   Streptococcus agalactiae NOT DETECTED NOT DETECTED Final   Streptococcus pneumoniae NOT DETECTED NOT DETECTED Final   Streptococcus pyogenes NOT DETECTED NOT DETECTED Final   Acinetobacter baumannii NOT DETECTED NOT DETECTED Final   Enterobacteriaceae species NOT DETECTED NOT DETECTED Final   Enterobacter cloacae complex NOT DETECTED NOT DETECTED Final   Escherichia coli NOT DETECTED NOT DETECTED Final   Klebsiella oxytoca NOT DETECTED NOT DETECTED Final   Klebsiella pneumoniae NOT DETECTED NOT DETECTED Final   Proteus species NOT DETECTED NOT DETECTED Final   Serratia marcescens NOT DETECTED NOT DETECTED Final   Haemophilus influenzae NOT DETECTED NOT DETECTED Final   Neisseria meningitidis NOT DETECTED NOT DETECTED Final   Pseudomonas aeruginosa NOT DETECTED NOT DETECTED Final   Candida albicans NOT DETECTED NOT DETECTED Final   Candida glabrata NOT DETECTED NOT DETECTED Final   Candida krusei NOT DETECTED NOT DETECTED Final   Candida parapsilosis NOT DETECTED NOT DETECTED Final   Candida tropicalis NOT DETECTED NOT DETECTED Final    Comment: Performed at [facility], [address], [place], [place] [address]  Culture, blood (Routine x 2)     Status: Abnormal   Collection Time: 12/03/2016  6:40  PM  Result Value Ref Range Status   Specimen Description BLOOD RIGHT FOREARM  Final   Special Requests   Final    BOTTLES DRAWN AEROBIC AND ANAEROBIC Blood Culture adequate volume   Culture  Setup Time   Final    GRAM POSITIVE COCCI IN BOTH AEROBIC AND ANAEROBIC BOTTLES CRITICAL VALUE NOTED.  VALUE IS CONSISTENT WITH PREVIOUSLY REPORTED AND CALLED VALUE.    Culture (A)  Final    STAPHYLOCOCCUS AUREUS SUSCEPTIBILITIES PERFORMED ON PREVIOUS CULTURE WITHIN THE LAST 5 DAYS. Performed at [facility], [place] [address], [place], [place] [address]    Report Status 12/07/2016 FINAL  Final  Culture, sputum-assessment     Status: None   Collection Time: 11/15/2016 11:40 PM  Result Value Ref Range Status   Specimen Description SPUTUM  Final   Special Requests Immunocompromised  Final   Sputum evaluation THIS SPECIMEN IS ACCEPTABLE FOR SPUTUM CULTURE  Final   Report Status 12/05/2016 FINAL  Final  Culture, respiratory (NON-Expectorated)     Status: None   Collection Time: 11/16/2016 11:40 PM  Result Value Ref Range Status   Specimen Description SPUTUM  Final   Special Requests Immunocompromised Reflexed from [id number]  Final   Gram Stain   Final    RARE WBC PRESENT,BOTH PMN AND MONONUCLEAR ABUNDANT SQUAMOUS EPITHELIAL CELLS PRESENT MODERATE GRAM POSITIVE COCCI MODERATE GRAM POSITIVE RODS MODERATE GRAM NEGATIVE RODS    Culture   Final    MODERATE Consistent with normal respiratory flora. Performed at [facility], [place] [address] [place],  [address]    Report Status 12/08/2016 FINAL  Final  Rapid strep screen     Status: None   Collection Time: 12/05/16  4:04 PM  Result Value Ref Range Status   Streptococcus, Group A Screen (Direct) NEGATIVE NEGATIVE Final    Comment: (NOTE) A Rapid Antigen test may result negative if the antigen level in the sample is below the detection level of this test. The FDA has not cleared  this test as a stand-alone test therefore the rapid  antigen negative result has reflexed to a Group A Strep culture.   Culture, group A strep     Status: None   Collection Time: 12/05/16  4:04 PM  Result Value Ref Range Status   Specimen Description THROAT  Final   Special Requests NONE Reflexed from [id number]  Final   Culture   Final    NO GROUP A STREP (S.PYOGENES) ISOLATED Performed at [facility], [address] [place], [place] [address]    Report Status 12/08/2016 FINAL  Final  MRSA PCR Screening     Status: None   Collection Time: 12/05/16  8:20 PM  Result Value Ref Range Status   MRSA by PCR NEGATIVE NEGATIVE Final    Comment:        The GeneXpert MRSA Assay (FDA approved for NASAL specimens only), is one component of a comprehensive MRSA colonization surveillance program. It is not intended to diagnose MRSA infection nor to guide or monitor treatment for MRSA infections.   Culture, blood (Routine X 2) w Reflex to ID Panel     Status: None   Collection Time: 12/06/16  4:05 AM  Result Value Ref Range Status   Specimen Description BLOOD LEFT HAND  Final   Special Requests   Final    BOTTLES DRAWN AEROBIC AND ANAEROBIC Blood Culture adequate volume   Culture   Final    NO GROWTH 5 DAYS Performed at [facility], [address], [place], [place] [address]    Report Status 12/11/2016 FINAL  Final  Aerobic/Anaerobic Culture (surgical/deep wound)     Status: None   Collection Time: 12/06/16 12:41 PM  Result Value Ref Range Status   Specimen Description WOUND RIGHT CHEST  Final   Special Requests Normal  Final   Gram Stain   Final    ABUNDANT WBC PRESENT, PREDOMINANTLY PMN ABUNDANT GRAM POSITIVE COCCI IN PAIRS    Culture   Final    ABUNDANT STAPHYLOCOCCUS AUREUS NO ANAEROBES ISOLATED Performed at [facility], [place] [address] [place], [place] [address]    Report Status 12/11/2016 FINAL  Final   Organism ID, Bacteria STAPHYLOCOCCUS AUREUS  Final      Susceptibility   Staphylococcus aureus - MIC*     CIPROFLOXACIN <=0.5 SENSITIVE Sensitive     ERYTHROMYCIN <=0.25 SENSITIVE Sensitive     GENTAMICIN <=0.5 SENSITIVE Sensitive     OXACILLIN 0.5 SENSITIVE Sensitive     TETRACYCLINE <=1 SENSITIVE Sensitive     VANCOMYCIN <=0.5 SENSITIVE Sensitive     TRIMETH/SULFA <=10 SENSITIVE Sensitive     CLINDAMYCIN <=0.25 SENSITIVE Sensitive     RIFAMPIN <=0.5 SENSITIVE Sensitive     Inducible Clindamycin NEGATIVE Sensitive     * ABUNDANT STAPHYLOCOCCUS AUREUS  Body fluid culture     Status: None   Collection Time: 12/07/16 10:31 AM  Result Value Ref Range Status   Specimen Description PLEURAL RIGHT  Final   Special Requests Immunocompromised  Final   Gram Stain   Final    ABUNDANT WBC PRESENT,BOTH PMN AND MONONUCLEAR NO ORGANISMS SEEN    Culture   Final    NO GROWTH 3 DAYS Performed at [facility], [place] [address] [place], [place] [address]    Report Status 12/10/2016 FINAL  Final      Radiology Studies: No results found.  Scheduled Meds: . chlorhexidine  15 mL Mouth Rinse BID  .  enoxaparin (LOVENOX) injection  40 mg Subcutaneous Q24H  . feeding supplement (OSMOLITE 1.5 CAL)  474 mL Per Tube TID WC  . free water  125 mL Per Tube Q4H  . gabapentin  300 mg Per Tube TID  . mouth rinse  15 mL Mouth Rinse q12n4p  . metoCLOPramide  5 mg Per Tube TID AC  . metoprolol tartrate  75 mg Oral BID  . pantoprazole (PROTONIX) IV  40 mg Intravenous Q12H  . senna-docusate  1 tablet Oral BID   Continuous Infusions: . sodium chloride 75 mL/hr at 12/14/16 0844  .  ceFAZolin (ANCEF) IV 2 g (12/14/16 1718)     LOS: 10 days   Signature  Barton Dubois M.D  [phone]

## 2016-12-14 NOTE — NC FL2 (Signed)
[place] LEVEL OF CARE SCREENING TOOL     IDENTIFICATION  Patient Name: Antonio [name] Birthdate: 08-Aug-1955 Sex: male Admission Date (Current Location): 11/29/2016  [facility] and [place] Number:  Herbalist and Address:  [facility],  [place], [place]      Provider Number: [id number]  Attending Physician Name and Address:  Barton Dubois, MD  Relative Name and Phone Number:       Current Level of Care: Hospital Recommended Level of Care: [place] Prior Approval Number:    Date Approved/Denied:   PASRR Number: [id number]  Discharge Plan: SNF    Current Diagnoses: Patient Active Problem List   Diagnosis Date Noted  . Chest wall abscess   . Bacteremia   . Pleural effusion, right   . Pleural effusion   . MSSA bacteremia 12/06/2016  . Bloodstream infection due to Port-A-Cath 12/06/2016  . Anemia associated with chemotherapy 12/05/2016  . Fall at home, initial encounter 12/05/2016  . Sepsis due to pneumonia ([place]) 12/03/2016  . Severe protein-calorie malnutrition ([place]) 11/28/2016  . Dysphagia   . Failure to thrive in adult   . 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 Encounter for palliative care   . Hypercalcemia 11/13/2016  . Hypercalcemia of malignancy 11/07/2016  . Port catheter in place 11/06/2016  . Acquired lymphedema 09/15/2016  . Anemia, chronic disease 08/30/2016  . Prerenal acute renal failure ([place]) 08/26/2016  . Pancytopenia, acquired ([place]) 07/27/2016  . Nausea without vomiting 07/27/2016  . Other constipation 07/20/2016  . 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 Malignant neoplasm of lower lobe of right lung ([place]) 06/28/2016  . Cancer associated pain 05/31/2016  . Weight loss 05/31/2016  . Dysarthria 05/31/2016  . 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[place]) 05/18/2016  . Sciatic leg pain 02/24/2015  . Goals of care, counseling/discussion 12/01/2014  . Hyperlipidemia  02/20/2013  . Varus deformity of knee 06/16/2011  . COLONIC POLYPS 03/14/2006  . Allergic rhinitis 01/19/2006  . COPD 01/19/2006  . Essential hypertension 01/11/2006  . Osteoarthrosis involving lower leg 01/11/2006    Orientation RESPIRATION BLADDER Height & Weight     Self, Time, Situation, Place  O2 (2L) Continent Weight: 119 lb 14.9 oz (54.4 kg) Height:  5\' 3"  (160 cm)  BEHAVIORAL SYMPTOMS/MOOD NEUROLOGICAL BOWEL NUTRITION STATUS      Continent Diet (NPO- see DC summary for updated diet )  AMBULATORY STATUS COMMUNICATION OF NEEDS Skin   Limited Assist Verbally Surgical wounds    removed chest port                   Personal Care Assistance Level of Assistance  Bathing, Feeding, Dressing Bathing Assistance: Limited assistance Feeding assistance: Independent Dressing Assistance: Independent     Functional Limitations Info  Sight, Hearing, Speech Sight Info: Adequate Hearing Info: Adequate Speech Info: Adequate    SPECIAL CARE FACTORS FREQUENCY  PT (By licensed PT)     PT Frequency: 5x              Contractures Contractures Info: Not present    Additional Factors Info  Code Status, Allergies Code Status Info: full code Allergies Info: nka           Current Medications (12/14/2016):  This is the current hospital active medication list Current Facility-Administered Medications  Medication Dose Route Frequency Provider Last Rate Last Dose  . 0.9 %  sodium chloride infusion   Intravenous Continuous  Barton Dubois, MD 75 mL/hr at 12/14/16 [phone]    . acetaminophen (TYLENOL) tablet 650 mg  650 mg Per Tube Q6H PRN Patrecia Pour, MD   650 mg at 12/12/16 1437  . ceFAZolin (ANCEF) IVPB 2g/100 mL premix  2 g Intravenous Q8H Eudelia Bunch, RPH   Stopped at 12/14/16 1059  . chlorhexidine (PERIDEX) 0.12 % solution 15 mL  15 mL Mouth Rinse BID Tamala Julian, Rondell A, MD   15 mL at 12/14/16 1021  . enoxaparin (LOVENOX) injection 40 mg  40 mg Subcutaneous Q24H Smith,  Rondell A, MD   40 mg at 12/14/16 1019  . feeding supplement (OSMOLITE 1.5 CAL) liquid 474 mL  474 mL Per Tube TID WC Barton Dubois, MD   474 mL at 12/14/16 1258  . free water 125 mL  125 mL Per Tube Q4H Barton Dubois, MD   125 mL at 12/14/16 1200  . gabapentin (NEURONTIN) 250 MG/5ML solution 300 mg  300 mg Per Tube TID Leodis Sias T, RPH   300 mg at 12/14/16 1021  . ipratropium-albuterol (DUONEB) 0.5-2.5 (3) MG/3ML nebulizer solution 3 mL  3 mL Nebulization Q6H PRN Hammonds, Sharyn Blitz, MD      . lidocaine (XYLOCAINE) 1 % (with pres) injection    PRN Aletta Edouard, MD   10 mL at 12/06/16 1215  . lidocaine-prilocaine (EMLA) cream   Topical PRN Fuller Plan A, MD      . MEDLINE mouth rinse  15 mL Mouth Rinse q12n4p Smith, Rondell A, MD   15 mL at 12/14/16 1258  . metoCLOPramide (REGLAN) 10 MG/10ML solution 5 mg  5 mg Per Tube TID AC Gorsuch, Ni, MD   5 mg at 12/14/16 1258  . metoprolol tartrate (LOPRESSOR) tablet 75 mg  75 mg Oral BID Barton Dubois, MD   75 mg at 12/14/16 1017  . morphine (MSIR) tablet 15 mg  15 mg Oral Q4H PRN Fuller Plan A, MD   15 mg at 12/14/16 1019  . MUSCLE RUB CREA   Topical PRN Fuller Plan A, MD      . ondansetron (ZOFRAN) tablet 4 mg  4 mg Oral Q6H PRN Fuller Plan A, MD       Or  . ondansetron (ZOFRAN) injection 4 mg  4 mg Intravenous Q6H PRN Smith, Rondell A, MD      . pantoprazole (PROTONIX) injection 40 mg  40 mg Intravenous Q12H Thurnell Lose, MD   40 mg at 12/14/16 1021  . senna-docusate (Senokot-S) tablet 1 tablet  1 tablet Oral BID Norval Morton, MD   1 tablet at 12/14/16 1019     Discharge Medications: Please see discharge summary for a list of discharge medications.  Relevant Imaging Results:  Relevant Lab Results:   Additional Information SS# [id number]. In need of skilled nursing for IV antibiotics. On day 10 of 28 needed   Nila Nephew, LCSW

## 2016-12-14 NOTE — Clinical Social Work Note (Signed)
Clinical Social Work Assessment  Patient Details  Name: Antonio [name] MRN: [id number] Date of Birth: February 20, 1955  Date of referral:  12/14/16               Reason for consult:  Facility Placement                Permission sought to share information with:  Facility Sport and exercise psychologist, Family Supports Permission granted to share information::  Yes, Verbal Permission Granted  Name::     Antonio [name]  Agency::     Relationship::  Sister  Contact Information:  [phone]  Housing/Transportation Living arrangements for the past 2 months:  Single Family Home Source of Information:  Patient Patient Interpreter Needed:  None Criminal Activity/Legal Involvement Pertinent to Current Situation/Hospitalization:  No - Comment as needed Significant Relationships:  Siblings Lives with:  Self Do you feel safe going back to the place where you live?  Yes (PT recommending SNF) Need for family participation in patient care:  No (Coment)  Care giving concerns:  Patient from home alone. Patient admitted with chief complaint generalized body aches. PT recommending SNF for rehab, patient also requiring IV antibiotics for 28 days.    Social Worker assessment / plan:  CSW spoke with patient at bedside regarding PT recommendation and need for IV antibiotics for 28 days. Patient reported that he is agreeable to SNF. CSW inquired about patient's refusal of PICC line, patient reported that he is now agreeable. CSW explained SNF process to patient. Patient reported that he did not want to talk anymore due to his throat hurting. CSW inquired about any family that CSW could contact, patient granted CSW verbal permission to speak with his sister "Bunchy".   CSW completed FL2 and will provide bed offers. CSW will follow up with patient's sister. CSW will continue to follow and assist with discharge planning.  Employment status:  Disabled (Comment on whether or not currently receiving  Disability) Insurance information:  Managed Medicare PT Recommendations:  [place] / Referral to community resources:  [place]  Patient/Family's Response to care:  Patient agreeable to SNF for ST rehab.  Patient/Family's Understanding of and Emotional Response to Diagnosis, Current Treatment, and Prognosis:  Patient presented guarded with goal oriented speech. Patient did not discuss diagnosis and verbalized plan to discharge to SNF. Patient reported that his throat was hurting and didn't want to speak much, CSW acknowledged patient's "throat hurting" and agreed to follow up with patient's sister.   Emotional Assessment Appearance:  Appears older than stated age Attitude/Demeanor/Rapport:  Guarded Affect (typically observed):  Irritable Orientation:  Oriented to Self, Oriented to Place, Oriented to  Time, Oriented to Situation Alcohol / Substance use:  Not Applicable Psych involvement (Current and /or in the community):  No (Comment)  Discharge Needs  Concerns to be addressed:  Discharge Planning Concerns Readmission within the last 30 days:  Yes Current discharge risk:  Lives alone Barriers to Discharge:  Continued Medical Work up   [employer], LCSW 12/14/2016, 4:00 PM

## 2016-12-14 NOTE — Progress Notes (Signed)
PROGRESS NOTE  Antonio [name]  HEN:[id number] DOB: September 23, 1955 DOA: 12/11/2016 PCP: Maryellen Pile, MD  Outpatient Specialists: Oncology, [name]   Brief Narrative:  Antonio [name] is a 61 y.o. male with a history of supraglottic laryngeal CA s/p resection, chemo/radiation currently on palliative chemotherapy who was brought from home by EMS due to weakness, a fall, and confusion which was reported by his sister. He reports he slipped and fell on urine and denies any confusion. He reported chills and a productive cough to admitting MD. He denied fevers but had a temperature of 103.85F, heart rates 134-141, respirations 18-32, blood pressures 117/57-122/66, O2 saturations 91-96% on room air. Labs revealed WBC 3.7, hemoglobin 8.2, MCV 76.8, RDW 16.4, platelets 184, BUN 23, creatinine 1, albumin 2.5, lactic acid trending upwards to 1.92. Chest x-ray showed right base collapse with small pleural effusion. Urinalysis was negative for any signs of infection. He was given 3L of normal saline, empiric antibiotics and admitted. Blood cultures became positive for MSSA, antibiotics narrowed to ancef per ID recommendations. Pt became hypotensive and was transferred to the stepdown unit with CCM consult, though vitals have stabilized with fluids alone. Port was removed with purulent pocket noted on 10/24 and thoracentesis was performed 10/25 fluid consistent with exudate, pulmonary has seen and signed off.   Continue IV antibiotics as indicated by ID. Will place PICC line and will concretize discharge plan.   Subjective:   Afebrile, no CP, no nausea, no vomiting. Hoarse (chronically) and looks chronically ill. Breathing stable, but requiring oxygen supplementation.   Assessment & Plan:  Sepsis due to MSSA bacteremia due to port infection  - in a immunocompromised patient, port which was infected removed on 12/06/2016, he was negative for hepatitis or HIV.   -ID following and on board.  Currently on Ancef  since 12/05/2016.  Negative transthoracic echogram for any vegetations, TEE cannot be done due to laryngeal cancer.  -plan is for 28 more days of IV ancef -repeat blood cx's so far neg -will place PICC -continue supportive care.  RLL opacity on imaging, along with right-sided pleural effusion.  CT chest done and thoracentesis obtained by pulmonary on 12/07/2016,could have compressive atelectasis versus pneumonia as per CT findings,fluid appears to be consistent with exudate. -continue current abx's.  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 [name] oncology and palliative care following, for now wishes to pursue full code and palliative chemo if needed.   -If declines may transition to palliative care/hospice but not now.  Persistent tachycardia.  In no pain or distress, has been hydrated, stable TSH and echogram, He is on intermittent tube feeds via PEG tube and in between he is n.p.o.  -continue IVF's -HR improving overall -if able to tolerate will adjust dose of B-blocker  Dysphagia -s/p PEG May 2018. Continue feeds thru PEG tube. Ok to give liquid po for pt comfort.  Pancytopenia  -Suspect due to marrow suppression.   -Required 1 unit of packed RBC transfusion on 12/05/2016 -stable H&H since then.  -will monitor trend intermittently  Fall with generalized weakness  -due to weakness and deconditioning, supportive care -will benefit of SNF for rehab -patient will like to go hom; but with poor social support.  History of hypercalcemia of malignancy  -resolved for now after IVF's given -will follow electrolytes trend   Peripheral neuropathy  -continue Neurontin   Essential hypertension.  -Continue b-clocker, adjust dose slowly. -BP stable.    DVT prophylaxis: SCDs Code Status:  Full Family Communication: sister at bedside Disposition Plan: patient seen and discussed with CM; currently hoe alone and probably  unable to take care of himself and continue/finish antibiotic therapy as an outpatient. In my opinion and recommendations will benefit of SNF.at discharge.  Consultants:    Oncology, [name].  ID, Dr. Johnnye Sima  IR, Dr. Anselm Pancoast  CCM, Dr. Ashok Cordia   Palliative care team  Procedures:    Echocardiogram 12/06/2016:   Left ventricle: The cavity size was normal. Wall thickness was normal. Systolic function was normal. The estimated ejection  fraction was in the range of 55% to 60%. Wall motion was normal;  there were no regional wall motion abnormalities. Doppler  parameters are consistent with abnormal left ventricular  relaxation (grade 1 diastolic dysfunction).  Mitral valve: Calcified annulus.  Impressions: - Normal LV systolic function; mild diastolic dysfunction.   Antimicrobials:   Vancomycin 10/22 > 10/23  Zosyn 10/22  Cefepime 10/22 > 10/23   Ancef 10/23 >>   Objective:  Vitals:   12/13/16 0517 12/13/16 1042 12/13/16 1350 12/13/16 2200  BP: 133/71 135/74 132/76 119/66  Pulse: (!) 125 (!) 115 (!) 110 (!) 109  Resp: (!) 24  18 18   Temp: 99.3 F (37.4 C)  97.6 F (36.4 C) 100.1 F (37.8 C)  TempSrc: Oral  Oral Oral  SpO2: 95%  95% 99%  Weight: 55.1 kg (121 lb 7.6 oz)     Height:        Intake/Output Summary (Last 24 hours) at 12/14/16 0100 Last data filed at 12/13/16 2159  Gross per 24 hour  Intake             4029 ml  Output             1400 ml  Net             2629 ml   Filed Weights   12/12/16 0500 12/13/16 0402 12/13/16 0517  Weight: 57.4 kg (126 lb 8.7 oz) 55.2 kg (121 lb 11.1 oz) 55.1 kg (121 lb 7.6 oz)    Exam General: AAOX2, no CP, no fever, no abd pain. Resp: no wheezing, positive rhonchi, no crackles Heart: no rubs, no gallops, S1 and S2 Abd: soft, NT, ND, positive Peg-tube in place. Skin:positive dressing on his right chest from recent removed port-a-cath; no drainage. Patient with PEG tube in place, site clean. Extremities: no edema, no  cyanosis  Data Reviewed: I have personally reviewed following labs and imaging studies  CBC:  Recent Labs Lab 12/08/16 0321 12/09/16 0316 12/10/16 0328 12/11/16 0339 12/13/16 0430  WBC 5.2 7.3 11.0* 14.0* 17.0*  HGB 9.5* 8.1* 8.4* 8.8* 8.5*  HCT 28.0* 23.6* 24.9* 26.7* 26.4*  MCV 78.9 79.2 79.6 81.4 81.5  PLT 173 197 259 279 920   Basic Metabolic Panel:  Recent Labs Lab 12/08/16 0321 12/09/16 0316 12/10/16 0328 12/11/16 0339 12/13/16 0430  NA 140 138 137 138 136  K 3.0* 3.3* 3.5 4.5 4.1  CL 103 97* 92* 95* 93*  CO2 29 31 35* 35* 32  GLUCOSE 108* 121* 105* 100* 93  BUN 12 11 11 11 11   CREATININE 0.65 0.60* 0.63 0.61 0.55*  CALCIUM 8.5* 8.9 9.0 9.2 9.5  MG  --   --  1.7 1.9  --    GFR: Estimated Creatinine Clearance: 76.5 mL/min (A) (by C-G formula based on SCr of 0.55 mg/dL (L)).   Liver Function Tests:  Recent Labs Lab 12/07/16 1104 12/13/16 0430  AST  --  58*  ALT  --  40  ALKPHOS  --  141*  BILITOT  --  0.7  PROT 5.1* 6.0*  ALBUMIN  --  1.8*   CBG:  Recent Labs Lab 12/12/16 2110 12/13/16 0752 12/13/16 1154 12/13/16 1655 12/13/16 2157  GLUCAP 104* 118* 129* 124* 106*   Thyroid Function Tests:  Recent Labs  12/11/16 0858  TSH 2.265   Urine analysis:    Component Value Date/Time   COLORURINE YELLOW 12/05/2016 1847   APPEARANCEUR CLEAR 12/03/2016 1847   LABSPEC 1.013 11/30/2016 1847   PHURINE 5.0 11/13/2016 1847   GLUCOSEU NEGATIVE 12/05/2016 1847   HGBUR NEGATIVE 11/17/2016 1847   BILIRUBINUR NEGATIVE 11/28/2016 1847   KETONESUR NEGATIVE 11/22/2016 1847   PROTEINUR NEGATIVE 11/26/2016 1847   NITRITE NEGATIVE 11/24/2016 1847   LEUKOCYTESUR NEGATIVE 12/10/2016 1847   Recent Results (from the past 240 hour(s))  Culture, blood (Routine x 2)     Status: Abnormal   Collection Time: 12/13/2016  6:37 PM  Result Value Ref Range Status   Specimen Description BLOOD RIGHT ANTECUBITAL  Final   Special Requests   Final    BOTTLES DRAWN  AEROBIC AND ANAEROBIC Blood Culture adequate volume   Culture  Setup Time   Final    GRAM POSITIVE COCCI IN BOTH AEROBIC AND ANAEROBIC BOTTLES CRITICAL RESULT CALLED TO, READ BACK BY AND VERIFIED WITH: M. Lear Ng Pharm.D. 10:55 12/05/16 ([email or web address]) Performed at [facility], [place] [address], [place], [place] [address]    Culture STAPHYLOCOCCUS AUREUS (A)  Final   Report Status 12/07/2016 FINAL  Final   Organism ID, Bacteria STAPHYLOCOCCUS AUREUS  Final      Susceptibility   Staphylococcus aureus - MIC*    CIPROFLOXACIN <=0.5 SENSITIVE Sensitive     ERYTHROMYCIN <=0.25 SENSITIVE Sensitive     GENTAMICIN <=0.5 SENSITIVE Sensitive     OXACILLIN 0.5 SENSITIVE Sensitive     TETRACYCLINE <=1 SENSITIVE Sensitive     VANCOMYCIN 1 SENSITIVE Sensitive     TRIMETH/SULFA <=10 SENSITIVE Sensitive     CLINDAMYCIN <=0.25 SENSITIVE Sensitive     RIFAMPIN <=0.5 SENSITIVE Sensitive     Inducible Clindamycin NEGATIVE Sensitive     * STAPHYLOCOCCUS AUREUS  Blood Culture ID Panel (Reflexed)     Status: Abnormal   Collection Time: 12/07/2016  6:37 PM  Result Value Ref Range Status   Enterococcus species NOT DETECTED NOT DETECTED Final   Listeria monocytogenes NOT DETECTED NOT DETECTED Final   Staphylococcus species DETECTED (A) NOT DETECTED Final    Comment: CRITICAL RESULT CALLED TO, READ BACK BY AND VERIFIED WITH: M. Chanetta Marshall.D. 10:55 12/05/16 ([email or web address])    Staphylococcus aureus DETECTED (A) NOT DETECTED Final    Comment: Methicillin (oxacillin) susceptible Staphylococcus aureus (MSSA). Preferred therapy is anti staphylococcal beta lactam antibiotic (Cefazolin or Nafcillin), unless clinically contraindicated. CRITICAL RESULT CALLED TO, READ BACK BY AND VERIFIED WITH: M. Lear Ng Pharm.D. 10:55 12/05/16 ([email or web address])    Methicillin resistance NOT DETECTED NOT DETECTED Final   Streptococcus species NOT DETECTED NOT DETECTED Final   Streptococcus agalactiae NOT DETECTED NOT DETECTED Final   Streptococcus  pneumoniae NOT DETECTED NOT DETECTED Final   Streptococcus pyogenes NOT DETECTED NOT DETECTED Final   Acinetobacter baumannii NOT DETECTED NOT DETECTED Final   Enterobacteriaceae species NOT DETECTED NOT DETECTED Final   Enterobacter cloacae complex NOT DETECTED NOT DETECTED Final   Escherichia coli NOT DETECTED NOT DETECTED Final   Klebsiella oxytoca NOT DETECTED NOT DETECTED Final   Klebsiella  pneumoniae NOT DETECTED NOT DETECTED Final   Proteus species NOT DETECTED NOT DETECTED Final   Serratia marcescens NOT DETECTED NOT DETECTED Final   Haemophilus influenzae NOT DETECTED NOT DETECTED Final   Neisseria meningitidis NOT DETECTED NOT DETECTED Final   Pseudomonas aeruginosa NOT DETECTED NOT DETECTED Final   Candida albicans NOT DETECTED NOT DETECTED Final   Candida glabrata NOT DETECTED NOT DETECTED Final   Candida krusei NOT DETECTED NOT DETECTED Final   Candida parapsilosis NOT DETECTED NOT DETECTED Final   Candida tropicalis NOT DETECTED NOT DETECTED Final    Comment: Performed at [facility], [place] [address] [place], [place] [address]  Culture, blood (Routine x 2)     Status: Abnormal   Collection Time: 12/08/2016  6:40 PM  Result Value Ref Range Status   Specimen Description BLOOD RIGHT FOREARM  Final   Special Requests   Final    BOTTLES DRAWN AEROBIC AND ANAEROBIC Blood Culture adequate volume   Culture  Setup Time   Final    GRAM POSITIVE COCCI IN BOTH AEROBIC AND ANAEROBIC BOTTLES CRITICAL VALUE NOTED.  VALUE IS CONSISTENT WITH PREVIOUSLY REPORTED AND CALLED VALUE.    Culture (A)  Final    STAPHYLOCOCCUS AUREUS SUSCEPTIBILITIES PERFORMED ON PREVIOUS CULTURE WITHIN THE LAST 5 DAYS. Performed at [facility], [place] [address] [place], [place] [address]    Report Status 12/07/2016 FINAL  Final  Culture, sputum-assessment     Status: None   Collection Time: 11/28/2016 11:40 PM  Result Value Ref Range Status   Specimen Description SPUTUM  Final   Special Requests  Immunocompromised  Final   Sputum evaluation THIS SPECIMEN IS ACCEPTABLE FOR SPUTUM CULTURE  Final   Report Status 12/05/2016 FINAL  Final  Culture, respiratory (NON-Expectorated)     Status: None   Collection Time: 12/09/2016 11:40 PM  Result Value Ref Range Status   Specimen Description SPUTUM  Final   Special Requests Immunocompromised Reflexed from [id number]  Final   Gram Stain   Final    RARE WBC PRESENT,BOTH PMN AND MONONUCLEAR ABUNDANT SQUAMOUS EPITHELIAL CELLS PRESENT MODERATE GRAM POSITIVE COCCI MODERATE GRAM POSITIVE RODS MODERATE GRAM NEGATIVE RODS    Culture   Final    MODERATE Consistent with normal respiratory flora. Performed at [facility], [place] [address] [place], [place] [address]    Report Status 12/08/2016 FINAL  Final  Rapid strep screen     Status: None   Collection Time: 12/05/16  4:04 PM  Result Value Ref Range Status   Streptococcus, Group A Screen (Direct) NEGATIVE NEGATIVE Final    Comment: (NOTE) A Rapid Antigen test may result negative if the antigen level in the sample is below the detection level of this test. The FDA has not cleared this test as a stand-alone test therefore the rapid antigen negative result has reflexed to a Group A Strep culture.   Culture, group A strep     Status: None   Collection Time: 12/05/16  4:04 PM  Result Value Ref Range Status   Specimen Description THROAT  Final   Special Requests NONE Reflexed from [id number]  Final   Culture   Final    NO GROUP A STREP (S.PYOGENES) ISOLATED Performed at [facility], [address], [place],  [address]    Report Status 12/08/2016 FINAL  Final  MRSA PCR Screening     Status: None   Collection Time: 12/05/16  8:20 PM  Result  Value Ref Range Status   MRSA by PCR NEGATIVE NEGATIVE Final    Comment:        The GeneXpert MRSA Assay (FDA approved for NASAL specimens only), is one component of a comprehensive MRSA colonization surveillance program. It is not intended  to diagnose MRSA infection nor to guide or monitor treatment for MRSA infections.   Culture, blood (Routine X 2) w Reflex to ID Panel     Status: None   Collection Time: 12/06/16  4:05 AM  Result Value Ref Range Status   Specimen Description BLOOD LEFT HAND  Final   Special Requests   Final    BOTTLES DRAWN AEROBIC AND ANAEROBIC Blood Culture adequate volume   Culture   Final    NO GROWTH 5 DAYS Performed at [facility], [address], [place], [place] [address]    Report Status 12/11/2016 FINAL  Final  Aerobic/Anaerobic Culture (surgical/deep wound)     Status: None   Collection Time: 12/06/16 12:41 PM  Result Value Ref Range Status   Specimen Description WOUND RIGHT CHEST  Final   Special Requests Normal  Final   Gram Stain   Final    ABUNDANT WBC PRESENT, PREDOMINANTLY PMN ABUNDANT GRAM POSITIVE COCCI IN PAIRS    Culture   Final    ABUNDANT STAPHYLOCOCCUS AUREUS NO ANAEROBES ISOLATED Performed at [facility], [place] [address], [place], [place] [address]    Report Status 12/11/2016 FINAL  Final   Organism ID, Bacteria STAPHYLOCOCCUS AUREUS  Final      Susceptibility   Staphylococcus aureus - MIC*    CIPROFLOXACIN <=0.5 SENSITIVE Sensitive     ERYTHROMYCIN <=0.25 SENSITIVE Sensitive     GENTAMICIN <=0.5 SENSITIVE Sensitive     OXACILLIN 0.5 SENSITIVE Sensitive     TETRACYCLINE <=1 SENSITIVE Sensitive     VANCOMYCIN <=0.5 SENSITIVE Sensitive     TRIMETH/SULFA <=10 SENSITIVE Sensitive     CLINDAMYCIN <=0.25 SENSITIVE Sensitive     RIFAMPIN <=0.5 SENSITIVE Sensitive     Inducible Clindamycin NEGATIVE Sensitive     * ABUNDANT STAPHYLOCOCCUS AUREUS  Body fluid culture     Status: None   Collection Time: 12/07/16 10:31 AM  Result Value Ref Range Status   Specimen Description PLEURAL RIGHT  Final   Special Requests Immunocompromised  Final   Gram Stain   Final    ABUNDANT WBC PRESENT,BOTH PMN AND MONONUCLEAR NO ORGANISMS SEEN    Culture   Final    NO  GROWTH 3 DAYS Performed at [facility], [place] [address] [place], [place] [address]    Report Status 12/10/2016 FINAL  Final      Radiology Studies: No results found.  Scheduled Meds: . chlorhexidine  15 mL Mouth Rinse BID  . enoxaparin (LOVENOX) injection  40 mg Subcutaneous Q24H  . feeding supplement (OSMOLITE 1.5 CAL)  474 mL Per Tube TID WC  . free water  100 mL Per Tube Q4H  . gabapentin  300 mg Per Tube TID  . mouth rinse  15 mL Mouth Rinse q12n4p  . metoCLOPramide  5 mg Per Tube TID AC  . metoprolol tartrate  50 mg Oral BID  . pantoprazole (PROTONIX) IV  40 mg Intravenous Q12H  . senna-docusate  1 tablet Oral BID   Continuous Infusions: . sodium chloride    .  ceFAZolin (ANCEF) IV Stopped (12/13/16 1727)  . lactated ringers 125 mL/hr at 12/13/16 1659     LOS:  10 days   Signature  Barton Dubois M.D  [id number]

## 2016-12-14 NOTE — Progress Notes (Signed)
Physical Therapy Treatment Patient Details Name: Antonio [name] MRN: [id number] DOB: Aug 17, 1955 Today's Date: 12/14/2016    History of Present Illness  61 y.o. male with medical history significant of supraglottic cancer(s/p resection, chemotherapy, radiation,and PEG followed by [name]), COPD, sickle cell trait; who presents with complaints of generalized body aches, fall. Dx of sepsis due to PNA. Recent admission 10/1-10/7/18 with hypercalcemia.     PT Comments    Pt with significant mobility decline and increased c/o weakness/fatigue.  "I feel tired".  Pt required + 2 assist for safety with amb and present with decreased amb distance due to dyspnea and B LE weakness.  Very unsteady gait with slow, sluggish steps and poor balance.  HIGH FALL RISK.  Do not rec pt amb his own.  Due to extended length of stay and slow progress, pt will need ST Rehab at SNF prior to safely returning home.  Pt agrees.  Will consult LPT.  SATURATION QUALIFICATIONS: (This note is used to comply with regulatory documentation for home oxygen)  Patient Saturations on Room Air at Rest = 92%  Patient Saturations on Room Air while Ambulating = 88%    Follow Up Recommendations  SNF     Equipment Recommendations       Recommendations for Other Services       Precautions / Restrictions Precautions Precautions: Fall Precaution Comments: pt reports having B knee braces at home Restrictions Weight Bearing Restrictions: No    Mobility  Bed Mobility Overal bed mobility: Needs Assistance Bed Mobility: Supine to Sit     Supine to sit: HOB elevated;Min assist     General bed mobility comments: increased time with increased c/o weakness/fatigue  Transfers Overall transfer level: Needs assistance Equipment used: Rolling walker (2 wheeled) Transfers: Sit to/from Stand Sit to Stand: Min assist Stand pivot transfers: Min assist       General transfer comment: 50% VC's on safety with turns and  completion prior to sit.  Pt sat quickly due to increased c/o fatigue/weakness.  Ambulation/Gait Ambulation/Gait assistance: Min assist Ambulation Distance (Feet): 28 Feet Assistive device: Rolling walker (2 wheeled) Gait Pattern/deviations: Step-through pattern;Decreased stride length Gait velocity: decreased   General Gait Details: decreased amb distance due to increased dyspnea and MAX c/o weakness/fatigue.  "I feel tired".  Unsteady gait requiring + 2 assist for safety/equipment and monitoring O2 sats. as well as HR.     Stairs            Wheelchair Mobility    Modified Rankin (Stroke Patients Only)       Balance                                            Cognition Arousal/Alertness: Awake/alert Behavior During Therapy: WFL for tasks assessed/performed Overall Cognitive Status: Within Functional Limits for tasks assessed                                        Exercises      General Comments        Pertinent Vitals/Pain Pain Assessment: Faces Faces Pain Scale: Hurts a little bit Pain Location: throat Pain Descriptors / Indicators: Grimacing Pain Intervention(s): Patient requesting pain meds-RN notified    Home Living  Prior Function            PT Goals (current goals can now be found in the care plan section) Progress towards PT goals: Progressing toward goals    Frequency    Min 3X/week      PT Plan Discharge plan needs to be updated    Co-evaluation              AM-PAC PT "6 Clicks" Daily Activity  Outcome Measure  Difficulty turning over in bed (including adjusting bedclothes, sheets and blankets)?: A Lot Difficulty moving from lying on back to sitting on the side of the bed? : A Lot Difficulty sitting down on and standing up from a chair with arms (e.g., wheelchair, bedside commode, etc,.)?: A Lot Help needed moving to and from a bed to chair (including a  wheelchair)?: A Lot Help needed walking in hospital room?: A Lot Help needed climbing 3-5 steps with a railing? : Total 6 Click Score: 11    End of Session Equipment Utilized During Treatment: Gait belt;Oxygen Activity Tolerance: Patient limited by fatigue Patient left: in chair;with call bell/phone within reach;with family/visitor present;with chair alarm set Nurse Communication: Mobility status PT Visit Diagnosis: Difficulty in walking, not elsewhere classified (R26.2);Muscle weakness (generalized) (M62.81)     Time: 0459-9774 PT Time Calculation (min) (ACUTE ONLY): 30 min  Charges:  $Gait Training: 8-22 mins $Therapeutic Activity: 8-22 mins                    G Codes:       Rica Koyanagi  PTA [facility]  Acute  Rehab Pager      [phone]

## 2016-12-14 NOTE — [facility] Notes (Signed)
Peripherally Inserted Central Catheter/Midline Placement  The IV Nurse has discussed with the patient and/or persons authorized to consent for the patient, the purpose of this procedure and the potential benefits and risks involved with this procedure.  The benefits include less needle sticks, lab draws from the catheter, and the patient may be discharged home with the catheter. Risks include, but not limited to, infection, bleeding, blood clot (thrombus formation), and puncture of an artery; nerve damage and irregular heartbeat and possibility to perform a PICC exchange if needed/ordered by physician.  Alternatives to this procedure were also discussed.  Bard Power PICC patient education guide, fact sheet on infection prevention and patient information card has been provided to patient /or left at bedside.    PICC/Midline Placement Documentation        Antonio [name] 12/14/2016, 4:50 PM

## 2016-12-14 DEATH — deceased

## 2016-12-15 ENCOUNTER — Inpatient Hospital Stay (HOSPITAL_COMMUNITY): Payer: Medicare Other

## 2016-12-15 ENCOUNTER — Encounter: Payer: Self-pay | Admitting: *Deleted

## 2016-12-15 ENCOUNTER — Other Ambulatory Visit: Payer: Self-pay

## 2016-12-15 DIAGNOSIS — E43 Unspecified severe protein-calorie malnutrition: Secondary | ICD-10-CM

## 2016-12-15 DIAGNOSIS — D638 Anemia in other chronic diseases classified elsewhere: Secondary | ICD-10-CM

## 2016-12-15 DIAGNOSIS — R509 Fever, unspecified: Secondary | ICD-10-CM

## 2016-12-15 DIAGNOSIS — R531 Weakness: Secondary | ICD-10-CM

## 2016-12-15 LAB — GLUCOSE, CAPILLARY
GLUCOSE-CAPILLARY: 140 mg/dL — AB (ref 65–99)
Glucose-Capillary: 97 mg/dL (ref 65–99)
Glucose-Capillary: 129 mg/dL — ABNORMAL HIGH (ref 65–99)
Glucose-Capillary: 160 mg/dL — ABNORMAL HIGH (ref 65–99)

## 2016-12-15 LAB — BASIC METABOLIC PANEL
Anion gap: 9 (ref 5–15)
BUN: 13 mg/dL (ref 6–20)
CHLORIDE: 94 mmol/L — AB (ref 101–111)
CO2: 31 mmol/L (ref 22–32)
Calcium: 9.6 mg/dL (ref 8.9–10.3)
Creatinine, Ser: 0.55 mg/dL — ABNORMAL LOW (ref 0.61–1.24)
GFR calc Af Amer: >60 mL/min (ref >60)
GFR calc non Af Amer: >60 mL/min (ref >60)
GLUCOSE: 102 mg/dL — AB (ref 65–99)
POTASSIUM: 4 mmol/L (ref 3.5–5.1)
Sodium: 134 mmol/L — ABNORMAL LOW (ref 135–145)

## 2016-12-15 LAB — CBC
HEMATOCRIT: 24.6 % — AB (ref 39.0–52.0)
HEMOGLOBIN: 8.1 g/dL — AB (ref 13.0–17.0)
MCH: 26.5 pg (ref 26.0–34.0)
MCHC: 32.9 g/dL (ref 30.0–36.0)
MCV: 80.4 fL (ref 78.0–100.0)
Platelets: 315 10*3/uL (ref 150–400)
RBC: 3.06 MIL/uL — AB (ref 4.22–5.81)
RDW: 16.8 % — ABNORMAL HIGH (ref 11.5–15.5)
WBC: 18.3 10*3/uL — ABNORMAL HIGH (ref 4.0–10.5)

## 2016-12-15 MED ORDER — IOPAMIDOL (ISOVUE-300) INJECTION 61%
INTRAVENOUS | Status: AC
  Administered 2016-12-15: 75 mL
  Filled 2016-12-15: qty 75

## 2016-12-15 NOTE — Clinical Social Work Placement (Signed)
   CLINICAL SOCIAL WORK PLACEMENT  NOTE  Date:  12/15/2016  Patient Details  Name: Antonio [name] MRN: [id number] Date of Birth: Sep 14, 1955  Clinical Social Work is seeking post-discharge placement for this patient at the [place] level of care (*CSW will initial, date and re-position this form in  chart as items are completed):  Yes   Patient/family provided with [place] Work Department's list of facilities offering this level of care within the geographic area requested by the patient (or if unable, by the patient's family).  Yes   Patient/family informed of their freedom to choose among providers that offer the needed level of care, that participate in Medicare, Medicaid or managed care program needed by the patient, have an available bed and are willing to accept the patient.  Yes   Patient/family informed of [facility]'s ownership interest in [facility] and [facility], as well as of the fact that they are under no obligation to receive care at these facilities.  PASRR submitted to EDS on       PASRR number received on 12/14/16     Existing PASRR number confirmed on       FL2 transmitted to all facilities in geographic area requested by pt/family on 12/14/16     FL2 transmitted to all facilities within larger geographic area on       Patient informed that his/her managed care company has contracts with or will negotiate with certain facilities, including the following:        Yes   Patient/family informed of bed offers received.  Patient chooses bed at       Physician recommends and patient chooses bed at      Patient to be transferred to   on  .  Patient to be transferred to facility by       Patient family notified on   of transfer.  Name of family member notified:        PHYSICIAN       Additional Comment:    _______________________________________________ Burnis Medin, LCSW 12/15/2016, 10:53 AM

## 2016-12-15 NOTE — Patient Outreach (Signed)
[facility]) Care Management  12/15/16  Trygg Mantz [birth year] [id number]  Patient was admitted to [facility] on 11/27/2016 due to sepsis. Patient has been inpatient for greater than 10 days with plans to discharged to SNF level of care at dc.   RNCM to perform case closure per workflow. Patient will be followed by [facility] SW for discharge planning. Hospital liaison to place referral once patient is scheduled for discharge.  [place] R. Braidyn Peace, RN, BSN, [place] Management Coordinator [phone]

## 2016-12-15 NOTE — [facility] Notes (Signed)
PROGRESS NOTE  Antonio [name]  EPP:[id number] DOB: May 29, 1955 DOA: 11/25/2016 PCP: Maryellen Pile, MD  Outpatient Specialists: Oncology, [name]   Brief Narrative:  Antonio [name] is a 61 y.o. male with a history of supraglottic laryngeal CA s/p resection, chemo/radiation currently on palliative chemotherapy who was brought from home by EMS due to weakness, a fall, and confusion which was reported by his sister. He reports he slipped and fell on urine and denies any confusion. He reported chills and a productive cough to admitting MD. He denied fevers but had a temperature of 103.83F, heart rates 134-141, respirations 18-32, blood pressures 117/57-122/66, O2 saturations 91-96% on room air. Labs revealed WBC 3.7, hemoglobin 8.2, MCV 76.8, RDW 16.4, platelets 184, BUN 23, creatinine 1, albumin 2.5, lactic acid trending upwards to 1.92. Chest x-ray showed right base collapse with small pleural effusion. Urinalysis was negative for any signs of infection. He was given 3L of normal saline, empiric antibiotics and admitted. Blood cultures became positive for MSSA, antibiotics narrowed to ancef per ID recommendations. Pt became hypotensive and was transferred to the stepdown unit with CCM consult, though vitals have stabilized with fluids alone. Port was removed with purulent pocket noted on 10/24 and thoracentesis was performed 10/25 fluid consistent with exudate, pulmonary has seen and signed off.   Continue IV antibiotics as indicated by ID. Will place PICC line and will concretize discharge plan.   Subjective:   Patient has spike fever his white blood cells continue to.  No chest pain, reports breathing is a stable even still requiring 2 L of oxygen supplementation.  No nausea or vomiting reported.  Denies abdominal pain.  Assessment & Plan:  Sepsis due to MSSA bacteremia due to port infection  - in a immunocompromised patient, port which was infected removed on 12/06/2016, he was negative for  hepatitis or HIV.   -ID following and on board.  Currently on Ancef since 12/05/2016.  Negative transthoracic echogram for any vegetations, TEE cannot be done due to laryngeal cancer.   -plan is for 17 more days of IV ancef (plan is for a total of 28 days) -repeat blood cx's so far neg -planning to place PICC line later today -continue supportive care. -Given continued fevers and further elevation of his WBCs, plan is to repeat CT scan of the chest and also ultrasound of the area where the Port-A-Cath has been removed in order to rule out any potential abscess or further suggestion of pneumonia/pleural effusion that may need to be tapped. Blood cx also repeated. -ID re-involved in the case, will follow rec's.  RLL opacity on imaging, along with right-sided pleural effusion.  CT chest done and thoracentesis obtained by pulmonary on 12/07/2016,could have compressive atelectasis versus pneumonia as per CT findings,fluid appears to be consistent with exudate. -continue current abx's. -no further fever and denies CP. -continue oxygen supplementation.  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 [name] oncology and palliative care following, for now wishes to pursue full code and palliative chemo if needed.   -If declines may transition to palliative care/hospice but not now.  Persistent tachycardia.  In no pain or distress, has been hydrated, stable TSH and echogram, He is on intermittent tube feeds via PEG tube and in between he is n.p.o.  -continue IVF's -HR improving overall; but still up -continue IV hydration.  Dysphagia -s/p PEG May 2018.  -Continue feeds thru PEG tube.  -Ok to give small amount of liquid po for pt  comfort after oral care  Pancytopenia  -Suspect due to marrow suppression.   -Required 1 unit of packed RBC transfusion on 12/05/2016 -stable H&H since then.  -continue to monitor CBC intermittently  Fall with  generalized weakness  -due to weakness and deconditioning,  -continue supportive care -patient will like to go home; but with poor social support. And will require continue IV antibiotic therapy for another 17 days or so. -patient in agreement to go to SNF at discharge. SW on board and aware   History of hypercalcemia of malignancy  -resolved for now after IVF's given -Follow electrolytes trend intermittently -continue free water six times a day through PEG  Peripheral neuropathy  -Continue Neurontin  Essential hypertension.  -Continue metoprolol 75 mg twice daily.   -BP stable -will monitor on telemetry  DVT prophylaxis: SCDs Code Status: Full Family Communication: No family at bedside today. Disposition Plan: Patient is status post PICC line placement, well-tolerated and in agreement to go to a facility after discharge for further care.  Currently still spiking fevers and with further elevation of his white blood cells.  Will pursue further workup and re-involved infectious disease into the case.  Consultants:    Oncology, [name].  ID, Dr. Johnnye Sima  IR, Dr. Anselm Pancoast  CCM, Dr. Ashok Cordia   Palliative care team  Procedures:    Echocardiogram 12/06/2016:   Left ventricle: The cavity size was normal. Wall thickness was normal. Systolic function was normal. The estimated ejection  fraction was in the range of 55% to 60%. Wall motion was normal;  there were no regional wall motion abnormalities. Doppler  parameters are consistent with abnormal left ventricular  relaxation (grade 1 diastolic dysfunction).  Mitral valve: Calcified annulus.  Impressions: - Normal LV systolic function; mild diastolic dysfunction.   Antimicrobials:   Vancomycin 10/22 > 10/23  Zosyn 10/22  Cefepime 10/22 > 10/23   Ancef 10/23 >>   Objective:  Vitals:   12/14/16 2001 12/14/16 2102 12/15/16 0450 12/15/16 1410  BP:  (!) 159/73 122/70 125/66  Pulse: (!) 129 (!) 128 (!) 117 (!) 119  Resp:  18 20 20 19   Temp:  (!) 101.1 F (38.4 C) 99.2 F (37.3 C) 100.3 F (37.9 C)  TempSrc:  Oral Oral Oral  SpO2: 91% 93% 95% 95%  Weight:   54.4 kg (119 lb 14.9 oz)   Height:        Intake/Output Summary (Last 24 hours) at 12/15/16 1828 Last data filed at 12/15/16 1646  Gross per 24 hour  Intake          3442.25 ml  Output             1230 ml  Net          2212.25 ml   Filed Weights   12/13/16 0517 12/14/16 0512 12/15/16 0450  Weight: 55.1 kg (121 lb 7.6 oz) 54.4 kg (119 lb 14.9 oz) 54.4 kg (119 lb 14.9 oz)    Exam General: Alert, awake and oriented x3; following commands and in no acute distress.  Patient denies chest pain and reports that his breathing is stable.  He spike fever again overnight and his white blood cells continue going up.   Resp: Good air movement bilaterally, normal respiratory effort, no wheezing, no crackles.   Heart: Tachycardic, no rubs, no gallops, no JVD; telemetry evaluation sinus rhythm.     Abd: Soft, nontender, nondistended, positive PEG tube in place, no drainage appreciated.   Skin: Skin exam unchanged  from 11/1: positive dressing on his right chest from recent removed port-a-cath; no drainage or erythema appreciated. Patient with PEG tube in place, site clean; no drainage. Extremities: No edema, no cyanosis, no clubbing.  Data Reviewed: I have personally reviewed following labs and imaging studies  CBC:  Recent Labs Lab 12/09/16 0316 12/10/16 0328 12/11/16 0339 12/13/16 0430 12/15/16 0449  WBC 7.3 11.0* 14.0* 17.0* 18.3*  HGB 8.1* 8.4* 8.8* 8.5* 8.1*  HCT 23.6* 24.9* 26.7* 26.4* 24.6*  MCV 79.2 79.6 81.4 81.5 80.4  PLT 197 259 279 335 825   Basic Metabolic Panel:  Recent Labs Lab 12/09/16 0316 12/10/16 0328 12/11/16 0339 12/13/16 0430 12/15/16 0449  NA 138 137 138 136 134*  K 3.3* 3.5 4.5 4.1 4.0  CL 97* 92* 95* 93* 94*  CO2 31 35* 35* 32 31  GLUCOSE 121* 105* 100* 93 102*  BUN 11 11 11 11 13   CREATININE 0.60* 0.63 0.61  0.55* 0.55*  CALCIUM 8.9 9.0 9.2 9.5 9.6  MG  --  1.7 1.9  --   --    GFR: Estimated Creatinine Clearance: 75.6 mL/min (A) (by C-G formula based on SCr of 0.55 mg/dL (L)).   Liver Function Tests:  Recent Labs Lab 12/13/16 0430  AST 58*  ALT 40  ALKPHOS 141*  BILITOT 0.7  PROT 6.0*  ALBUMIN 1.8*   CBG:  Recent Labs Lab 12/14/16 1711 12/14/16 1745 12/15/16 0749 12/15/16 1209 12/15/16 1607  GLUCAP 133* 172* 97 129* 140*   Urine analysis:    Component Value Date/Time   COLORURINE YELLOW 12/09/2016 1847   APPEARANCEUR CLEAR 12/12/2016 1847   LABSPEC 1.013 12/03/2016 1847   PHURINE 5.0 11/14/2016 1847   GLUCOSEU NEGATIVE 11/15/2016 1847   HGBUR NEGATIVE 11/28/2016 1847   BILIRUBINUR NEGATIVE 11/21/2016 1847   KETONESUR NEGATIVE 11/19/2016 1847   PROTEINUR NEGATIVE 11/23/2016 1847   NITRITE NEGATIVE 11/30/2016 1847   LEUKOCYTESUR NEGATIVE 12/01/2016 1847   Recent Results (from the past 240 hour(s))  MRSA PCR Screening     Status: None   Collection Time: 12/05/16  8:20 PM  Result Value Ref Range Status   MRSA by PCR NEGATIVE NEGATIVE Final    Comment:        The GeneXpert MRSA Assay (FDA approved for NASAL specimens only), is one component of a comprehensive MRSA colonization surveillance program. It is not intended to diagnose MRSA infection nor to guide or monitor treatment for MRSA infections.   Culture, blood (Routine X 2) w Reflex to ID Panel     Status: None   Collection Time: 12/06/16  4:05 AM  Result Value Ref Range Status   Specimen Description BLOOD LEFT HAND  Final   Special Requests   Final    BOTTLES DRAWN AEROBIC AND ANAEROBIC Blood Culture adequate volume   Culture   Final    NO GROWTH 5 DAYS Performed at [facility], [address], [place], [place] [address]    Report Status 12/11/2016 FINAL  Final  Aerobic/Anaerobic Culture (surgical/deep wound)     Status: None   Collection Time: 12/06/16 12:41 PM  Result Value Ref Range  Status   Specimen Description WOUND RIGHT CHEST  Final   Special Requests Normal  Final   Gram Stain   Final    ABUNDANT WBC PRESENT, PREDOMINANTLY PMN ABUNDANT GRAM POSITIVE COCCI IN PAIRS    Culture   Final    ABUNDANT STAPHYLOCOCCUS AUREUS NO ANAEROBES ISOLATED Performed at [facility], [place] [address] [place], [place] [address]    Report Status 12/11/2016 FINAL  Final   Organism ID, Bacteria STAPHYLOCOCCUS AUREUS  Final      Susceptibility   Staphylococcus aureus - MIC*    CIPROFLOXACIN <=0.5 SENSITIVE Sensitive     ERYTHROMYCIN <=0.25 SENSITIVE Sensitive     GENTAMICIN <=0.5 SENSITIVE Sensitive     OXACILLIN 0.5 SENSITIVE Sensitive     TETRACYCLINE <=1 SENSITIVE Sensitive     VANCOMYCIN <=0.5 SENSITIVE Sensitive     TRIMETH/SULFA <=10 SENSITIVE Sensitive     CLINDAMYCIN <=0.25 SENSITIVE Sensitive     RIFAMPIN <=0.5 SENSITIVE Sensitive     Inducible Clindamycin NEGATIVE Sensitive     * ABUNDANT STAPHYLOCOCCUS AUREUS  Body fluid culture     Status: None   Collection Time: 12/07/16 10:31 AM  Result Value Ref Range Status   Specimen Description PLEURAL RIGHT  Final   Special Requests Immunocompromised  Final   Gram Stain   Final    ABUNDANT WBC PRESENT,BOTH PMN AND MONONUCLEAR NO ORGANISMS SEEN    Culture   Final    NO GROWTH 3 DAYS Performed at [facility], [place] [address], [place], [place] [address]    Report Status 12/10/2016 FINAL  Final      Radiology Studies: Ct Chest W Contrast  Result Date: 12/15/2016 CLINICAL DATA:  61 year old male with persistent shortness of breath, pleural effusions, fevers and bacteremia. 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 EXAM: CT CHEST WITH CONTRAST TECHNIQUE: Multidetector CT imaging of the chest was performed during intravenous contrast administration. CONTRAST:  22mL ISOVUE-300 IOPAMIDOL (ISOVUE-300) INJECTION 61% COMPARISON:  12/06/2016 and 11/13/2016 chest CTs. FINDINGS:  Cardiovascular: Normal heart size. Coronary artery calcifications again identified. Thoracic aortic atherosclerotic calcifications noted without aneurysm. No pericardial effusion. A left PICC line is present with tip at the superior cavoatrial junction. Mediastinum/Nodes: No significant change in enlarged bilateral mediastinal and hilar lymph nodes. No other changes identified. Lungs/Pleura: A large right pleural effusion is again noted but now appears loculated. An enlarging moderate to large left pleural effusion is noted. Compressive atelectasis of both lower lungs noted, right greater than left. Multiple pulmonary nodules are again identified and unchanged. Mild interlobular septal thickening is nonspecific but may represent mild edema. There is no evidence of pneumothorax Upper Abdomen: Two adjacent 2.7 cm hypodense lesions within the hepatic dome are unchanged from 12/06/2016 but new since 11/13/2016. A gastrostomy tube is present. Musculoskeletal: The superior aspect of a soft tissue mass involving the posterolateral left eleventh rib is noted. A lytic lesion within the T9 vertebral body new since 11/13/2016. IMPRESSION: 1. Large right pleural effusion again noted but now appears loculated. Compressive atelectasis of the right lower lung. 2. Enlarging moderate to large left pleural effusion with left lower lung atelectasis. 3. 2 adjacent 2.7 cm hypodense lesions within the hepatic dome, unchanged from 12/06/2016 but new since 11/13/2016. Although this could represent rapidly progressive metastatic disease, infection is not excluded given patient history. 4. Pulmonary, mediastinal/hilar lymphatic and left eleventh rib metastases as described. 5. T9 vertebral body lesion, new since 11/13/2016. This probably represents rapid development of a metastasis as this would be an unusual appearance of infection. Electronically Signed   By: Margarette Canada M.D.   On: 12/15/2016 16:35    Scheduled Meds: . chlorhexidine   15 mL Mouth Rinse BID  . enoxaparin (LOVENOX) injection  40 mg Subcutaneous Q24H  . feeding supplement (OSMOLITE 1.5 CAL)  474 mL Per Tube TID WC  . free water  125 mL Per Tube Q4H  . gabapentin  300 mg Per Tube TID  . mouth rinse  15 mL Mouth Rinse q12n4p  . metoCLOPramide  5 mg Per Tube TID AC  . metoprolol tartrate  75 mg Oral BID  . pantoprazole (PROTONIX) IV  40 mg Intravenous Q12H  . senna-docusate  1 tablet Oral BID   Continuous Infusions: . sodium chloride 75 mL/hr at 12/15/16 1734  .  ceFAZolin (ANCEF) IV Stopped (12/15/16 1742)     LOS: 11 days   Signature  Barton Dubois M.D  [phone]

## 2016-12-15 NOTE — [facility] Notes (Signed)
CSW contacted by patient's sister Antonio [name]). CSW provided update and bed offers, patient's requested that CSW follow up with her regarding SNF selection. CSW will continue to follow and assist with discharge planning.  Abundio Miu, [place] Social Worker [facility] Cell#: [phone]

## 2016-12-15 NOTE — [facility] Notes (Signed)
Pt plan discharge to SNF, CSW following.

## 2016-12-15 NOTE — Consult Note (Signed)
   [facility] CM Inpatient Consult   12/15/2016  Branston Halsted [birth year] [id number]   Patient is currently active with [facility] for chronic disease management services.  Patient has been engaged by a [employer].  Our community based plan of care has focused on disease management and community resource support.  Patient will receive post hospital discharge visits at SNF from [facility] to assist patient with discharge planning to home.  Of note, [facility] Care Management services does not replace or interfere with any services that are needed or arranged by inpatient case management or social work.  For additional questions or referrals please contact:  Marysa Wessner RN, [facility] Liaison  [phone]) [place] [phone]) Toll free office

## 2016-12-15 NOTE — [facility] Notes (Signed)
Antonio [name]   DOB:1955/08/15   UT#:[id number]    Subjective: He stated that his pain control is stable.  He still ruminates about the possibility of going home for a few days first before coming back to complete antibiotic treatment  Objective:  Vitals:   12/14/16 2102 12/15/16 0450  BP: (!) 159/73 122/70  Pulse: (!) 128 (!) 117  Resp: 20 20  Temp: (!) 101.1 F (38.4 C) 99.2 F (37.3 C)  SpO2: 93% 95%     Intake/Output Summary (Last 24 hours) at 12/15/16 0818 Last data filed at 12/15/16 0813  Gross per 24 hour  Intake          4194.67 ml  Output             1250 ml  Net          2944.67 ml    GENERAL:alert, no distress and comfortable SKIN: skin color, texture, turgor are normal, no rashes or significant lesions EYES: normal, Conjunctiva are pink and non-injected, sclera clear OROPHARYNX:no exudate, no erythema and lips, buccal mucosa, and tongue normal  NECK: supple, thyroid normal size, non-tender, without nodularity LYMPH:  no palpable lymphadenopathy in the cervical, axillary or inguinal LUNGS: clear to auscultation and percussion with normal breathing effort HEART: regular rate & rhythm and no murmurs and no lower extremity edema ABDOMEN:abdomen soft, non-tender and normal bowel sounds Musculoskeletal:no cyanosis of digits and no clubbing  NEURO: alert & oriented x 3 with dysarthria, no focal motor/sensory deficits   Labs:  Lab Results  Component Value Date   WBC 18.3 (H) 12/15/2016   HGB 8.1 (L) 12/15/2016   HCT 24.6 (L) 12/15/2016   MCV 80.4 12/15/2016   PLT 315 12/15/2016   NEUTROABS 2.1 12/06/2016    Lab Results  Component Value Date   NA 134 (L) 12/15/2016   K 4.0 12/15/2016   CL 94 (L) 12/15/2016   CO2 31 12/15/2016    Assessment & Plan:  Metastatic supraglottic cancer with lung metastasis The patient presented with sepsis secondary to bacteremia and/or pneumonia We will hold chemotherapy and provide supportive care for now It is not clear to me  that he will be strong enough to undergo chemotherapy in the future. Per infectious disease team, the patient would need minimum 4 weeks of antibiotic treatment and I would not resume treatment until antibiotic therapy is completed  Anemia chronic illness The patient is asymptomatic He had received 1 unit of blood transfusion on December 05, 2016  MSSA bacteremia, progressive leukocytosis and febrile episode Consider repeat cultures plus minus imaging study Continue IV antibiotic therapy It appears he needs about 4 weeks of antibiotic treatment  Recent falls He is frail according to physical therapy.  Not safe to go home.  He would benefit from skilled nursing facility/rehab  Severe protein calorie malnutrition The patient complained of postprandial vomiting, improved on scheduled Reglan  Cancer associated pain He will continue to take morphine sulfate as needed  Discharge planning He is not ready to be discharged The patient is tachycardic and weak I felt that it is prudent to support him with ongoing IV antibiotics and to consider skilled nursing facility placement afterwards for rehab If he does not improve with aggressive supportive care, he may be transitioned to palliative care/hospice I will continue to follow   [name], MD 12/15/2016  8:18 AM

## 2016-12-15 NOTE — Consult Note (Signed)
Date of Admission:  12/05/2016          Reason for Consult: persistent fevers    Referring Provider: Dr. Jose Persia   Assessment: 1. MSSA bacteremia due to infected port-a-cath 2. Pleural effusions 3. Metastatic squamous cell cancer of throat 4. Fevers   Plan: 1. Agree with CT chest 2. Also consider US of the chest wall where port was removed and ? Area here needs formal I and D by Surgery 3. Continue IV ancef 4. Repeat blood cultures 5. May need to pull new PICC if he has fevers without explanation  Dr. Baxter Flattery will check on the patient this weekend.   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 chlorhexidine  15 mL Mouth Rinse BID  . enoxaparin (LOVENOX) injection  40 mg Subcutaneous Q24H  . feeding supplement (OSMOLITE 1.5 CAL)  474 mL Per Tube TID WC  . free water  125 mL Per Tube Q4H  . gabapentin  300 mg Per Tube TID  . mouth rinse  15 mL Mouth Rinse q12n4p  . metoCLOPramide  5 mg Per Tube TID AC  . metoprolol tartrate  75 mg Oral BID  . pantoprazole (PROTONIX) IV  40 mg Intravenous Q12H  . senna-docusate  1 tablet Oral BID   Continuous Infusions: . sodium chloride 75 mL/hr at 12/14/16 2336  .  ceFAZolin (ANCEF) IV Stopped (12/15/16 0947)   PRN Meds:.acetaminophen **OR** [DISCONTINUED] acetaminophen, ipratropium-albuterol, lidocaine, lidocaine-prilocaine, morphine, MUSCLE RUB, ondansetron **OR** ondansetron (ZOFRAN) IV, sodium chloride flush  HPI: Antonio [name] is a 61 y.o. 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 Port was removed on 12/06/16. I can see blood cultures taken that same day that were NO GROWTH. But it does appear that it appears to have been collected PRIOR to removal of the port-a-cath. He had a TTE but no TEE. NO vegetations were seen on TTE. He has remained on ancef and has had low grade fevers which have gone up in last 48 hours.  He underwent thoracocentesis of pleural effusion though this effusion was not an empyema.   On exam he has some tenderness near the port-a cath site      Review of Systems: Review of Systems  Constitutional: Negative for chills, fever, malaise/fatigue and weight loss.  HENT: Negative for congestion and sore throat.   Eyes: Negative for blurred vision and photophobia.  Respiratory: Positive for cough. Negative for shortness of breath and wheezing.   Cardiovascular: Negative for chest pain, palpitations and leg swelling.  Gastrointestinal: Negative for abdominal pain, blood in stool, constipation, diarrhea, heartburn, melena, nausea and vomiting.  Genitourinary: Negative for dysuria, flank pain and hematuria.  Musculoskeletal: Positive for myalgias. Negative for back pain, falls and joint pain.  Skin: Negative for itching and rash.  Neurological: Negative for dizziness, focal weakness, loss of consciousness, weakness and  headaches.  Endo/Heme/Allergies: Does not bruise/bleed easily.  Psychiatric/Behavioral: Positive for depression. Negative for suicidal ideas. The patient does not have insomnia.     Past Medical History:  Diagnosis Date  . Allergy    allergic rhinitis  . Arthritis    "qwhere" (05/17/2016)  . Chronic cough   . Chronic sinus infection   . Colonic polyp   . COPD (chronic obstructive pulmonary disease) ([place])   . 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  Dysphagia    normal EGD except for mild gastritis in 01/2006  . GERD (gastroesophageal reflux disease)   . Head and neck malignancy ([place]) 05/18/2016  . High cholesterol   . History of radiation therapy 06/28/16- 08/17/16   Larynx 70 Gy in 35 fractions to gross disease, 63 Gy in 35 fractions to high risk nodal echelons, and 56 Gy in 35 fractions to intermediate risk nodal echelons  . History of radiation therapy 06/28/16- 08/17/16   Right Lung 60 Gy in 5 fractions--- given at same time as Larynx radiation  . Hypertension   . Rectal bleeding    colonoscopy in 01/2006  . Rectal polyp    01/2006 with path showing it to be benign, though with a question that it was incomplete biopsy, next scheduled coloscopy 2010  . Sickle cell trait ([place])   . Tobacco abuse   . Varus deformity of knee    bilateral, congenital    Social History  Substance Use Topics  . Smoking status: Former Smoker    Packs/day: 0.12    Years: 43.00    Types: Cigarettes  . Smokeless tobacco: Never Used     Comment: cuting back  . Alcohol use 14.4 oz/week    24 Cans of beer per week     Comment: he tells me he has decreased. He is drinking 6-8 cans of beer a week.     Family History  Problem Relation Age of Onset  . Cancer Mother        gastric ca  . Hypertension Father   . Diabetes Father   . Diabetes Sister   . CAD Sister    No Known Allergies  OBJECTIVE: Blood pressure 125/66, pulse (!) 119, temperature 100.3 F (37.9 C), temperature source Oral, resp. rate 19, height 5\' 3"  (1.6 m), weight 119 lb 14.9 oz (54.4 kg), SpO2 95 %.  Physical Exam  Constitutional: He is oriented to person, place, and time. No distress.  HENT:  Head: Normocephalic and atraumatic.  Nose: Nose normal.  Mouth/Throat: Oropharynx is clear and moist. No oropharyngeal exudate.  Eyes: Pupils are equal, round, and reactive to light. Conjunctivae and EOM are normal.  Neck: Normal range of motion. Neck supple.  Cardiovascular: Normal rate,  regular rhythm and normal heart sounds.  Exam reveals no gallop and no friction rub.   No murmur heard. Pulmonary/Chest: Effort normal. No respiratory distress. He has decreased breath sounds in the right lower field and the left lower field. He has no wheezes. He has no rales.    Abdominal: Soft. Bowel sounds are normal. He exhibits no distension. There is no tenderness. There is no rebound.  Musculoskeletal: Normal range of motion. He exhibits no edema or tenderness.  Lymphadenopathy:    He has no cervical adenopathy.  Neurological: He is alert and oriented to person, place, and time. Gait normal. Coordination normal.  Skin: Skin is warm and dry. No rash noted. He is not diaphoretic. No erythema.  No pallor.  Psychiatric: Mood, memory, affect and judgment normal.    Lab Results Lab Results  Component Value Date   WBC 18.3 (H) 12/15/2016   HGB 8.1 (L) 12/15/2016   HCT 24.6 (L) 12/15/2016   MCV 80.4 12/15/2016   PLT 315 12/15/2016    Lab Results  Component Value Date   CREATININE 0.55 (L) 12/15/2016   BUN 13 12/15/2016   NA 134 (L) 12/15/2016   K 4.0 12/15/2016   CL 94 (L) 12/15/2016   CO2 31 12/15/2016    Lab Results  Component Value Date   ALT 40 12/13/2016   AST 58 (H) 12/13/2016   ALKPHOS 141 (H) 12/13/2016   BILITOT 0.7 12/13/2016     Microbiology: Recent Results (from the past 240 hour(s))  Rapid strep screen     Status: None   Collection Time: 12/05/16  4:04 PM  Result Value Ref Range Status   Streptococcus, Group A Screen (Direct) NEGATIVE NEGATIVE Final    Comment: (NOTE) A Rapid Antigen test may result negative if the antigen level in the sample is below the detection level of this test. The FDA has not cleared this test as a stand-alone test therefore the rapid antigen negative result has reflexed to a Group A Strep culture.   Culture, group A strep     Status: None   Collection Time: 12/05/16  4:04 PM  Result Value Ref Range Status   Specimen  Description THROAT  Final   Special Requests NONE Reflexed from [id number]  Final   Culture   Final    NO GROUP A STREP (S.PYOGENES) ISOLATED Performed at [facility], [address], [place], [place] [address]    Report Status 12/08/2016 FINAL  Final  MRSA PCR Screening     Status: None   Collection Time: 12/05/16  8:20 PM  Result Value Ref Range Status   MRSA by PCR NEGATIVE NEGATIVE Final    Comment:        The GeneXpert MRSA Assay (FDA approved for NASAL specimens only), is one component of a comprehensive MRSA colonization surveillance program. It is not intended to diagnose MRSA infection nor to guide or monitor treatment for MRSA infections.   Culture, blood (Routine X 2) w Reflex to ID Panel     Status: None   Collection Time: 12/06/16  4:05 AM  Result Value Ref Range Status   Specimen Description BLOOD LEFT HAND  Final   Special Requests   Final    BOTTLES DRAWN AEROBIC AND ANAEROBIC Blood Culture adequate volume   Culture   Final    NO GROWTH 5 DAYS Performed at [facility], [address] [place], [place] [address]    Report Status 12/11/2016 FINAL  Final  Aerobic/Anaerobic Culture (surgical/deep wound)     Status: None   Collection Time: 12/06/16 12:41 PM  Result Value Ref Range Status   Specimen Description WOUND RIGHT CHEST  Final   Special Requests Normal  Final   Gram Stain   Final    ABUNDANT WBC PRESENT, PREDOMINANTLY PMN ABUNDANT GRAM POSITIVE COCCI IN PAIRS    Culture   Final    ABUNDANT STAPHYLOCOCCUS AUREUS NO ANAEROBES ISOLATED Performed at [facility], [place] [address], [place], [place] [address]    Report Status 12/11/2016 FINAL  Final   Organism ID, Bacteria STAPHYLOCOCCUS AUREUS  Final      Susceptibility   Staphylococcus aureus - MIC*    CIPROFLOXACIN <=  0.5 SENSITIVE Sensitive     ERYTHROMYCIN <=0.25 SENSITIVE Sensitive     GENTAMICIN <=0.5 SENSITIVE Sensitive     OXACILLIN 0.5 SENSITIVE Sensitive     TETRACYCLINE <=1  SENSITIVE Sensitive     VANCOMYCIN <=0.5 SENSITIVE Sensitive     TRIMETH/SULFA <=10 SENSITIVE Sensitive     CLINDAMYCIN <=0.25 SENSITIVE Sensitive     RIFAMPIN <=0.5 SENSITIVE Sensitive     Inducible Clindamycin NEGATIVE Sensitive     * ABUNDANT STAPHYLOCOCCUS AUREUS  Body fluid culture     Status: None   Collection Time: 12/07/16 10:31 AM  Result Value Ref Range Status   Specimen Description PLEURAL RIGHT  Final   Special Requests Immunocompromised  Final   Gram Stain   Final    ABUNDANT WBC PRESENT,BOTH PMN AND MONONUCLEAR NO ORGANISMS SEEN    Culture   Final    NO GROWTH 3 DAYS Performed at [facility], [place] [address], [place], [place] [address]    Report Status 12/10/2016 FINAL  Final    Alcide Evener, [place] [facility] [phone] pager   [phone] cell 12/15/2016, 3:32 PM

## 2016-12-15 NOTE — Plan of Care (Signed)
Problem: Safety: Goal: Ability to remain free from injury will improve Outcome: Progressing Encouraged patient to call and wait for help before getting up to prevent fall.

## 2016-12-15 NOTE — Progress Notes (Signed)
Nutrition Follow-up  DOCUMENTATION CODES:   Severe malnutrition in context of chronic illness  INTERVENTION:    Continue current TF regimen  Continue PO intakes as tolerated for comfort   NUTRITION DIAGNOSIS:   Malnutrition (Severe) related to chronic illness, cancer and cancer related treatments as evidenced by percent weight loss, severe depletion of muscle mass, severe depletion of body fat.  Ongoing  GOAL:   Patient will meet greater than or equal to 90% of their needs  Meeting with TF  MONITOR:   TF tolerance, Diet advancement, Weight trends, Labs, Skin  REASON FOR ASSESSMENT:   Consult    ASSESSMENT:   Pt with PMH significant for supraglottic cancer s/p radiation/chemotherapy/resection, dysphagia s/p PEG, COPD, tobacco abuse, and HTN. Recently sent over from [facility] 10/2 for admission with concerning hypercalcemia. Presents this admission with sepsis PNA and anemia associated with chemotherapy.   Pt tolerating  6 cartons Osmolite 1.5 per day with 100 mL free water every 4 hours (x6/day) via PEG. This provides 2130 kcal, 89 grams of protein, and 1686 mL free water.  Diet marked as NPO, but pt consuming liquids for comfort.   Weight noted to be down 10 lb since last RD visit (10/26). Suspect this is fluid loss verus dry weight loss.   Pt denies any abdominal pain or nausea.   Will continue with current nutrition interventions.   Medications reviewed and include: reglan, senokot, NS @ 75 ml/hr, IV abx Labs reviewed: Na 134 (L)  Diet Order:  Diet NPO time specified  EDUCATION NEEDS:   No education needs identified at this time  Skin:  Skin Assessment: Skin Integrity Issues: Skin Integrity Issues:: Other (Comment) (R chest puncture 10/24 from port removal)  Last BM:  12/13/16  Height:   Ht Readings from Last 1 Encounters:  12/05/16 5\' 3"  (1.6 m)    Weight:   Wt Readings from Last 1 Encounters:  12/15/16 119 lb 14.9 oz (54.4 kg)     Ideal Body Weight:  56.4 kg  BMI:  Body mass index is 21.24 kg/m.  Estimated Nutritional Needs:   Kcal:  1910-2182 kcal (35-39 kcal/kg)  Protein:  80-90 grams (1.4-1.6 g/kg)  Fluid:  2.2 L fluid    Mariana Single RD, LDN Clinical Nutrition Pager # [phone]

## 2016-12-15 NOTE — Progress Notes (Signed)
CSW contacted patient's sister Dolores Lory "Bunchy" Candis Schatz) to discuss discharge planning, no answer. CSW left voicemail requesting return phone call.   Abundio Miu, [place] Social Worker [facility] Cell#: [phone]

## 2016-12-16 ENCOUNTER — Inpatient Hospital Stay (HOSPITAL_COMMUNITY): Payer: Medicare Other

## 2016-12-16 LAB — PROTEIN, PLEURAL OR PERITONEAL FLUID

## 2016-12-16 LAB — LACTATE DEHYDROGENASE, PLEURAL OR PERITONEAL FLUID: LD FL: 601 U/L — AB (ref 3–23)

## 2016-12-16 LAB — BODY FLUID CELL COUNT WITH DIFFERENTIAL
Lymphs, Fluid: 30 %
Monocyte-Macrophage-Serous Fluid: 3 % — ABNORMAL LOW (ref 50–90)
NEUTROPHIL FLUID: 66 % — AB (ref 0–25)
Total Nucleated Cell Count, Fluid: 2053 cu mm — ABNORMAL HIGH (ref 0–1000)

## 2016-12-16 LAB — GLUCOSE, CAPILLARY
GLUCOSE-CAPILLARY: 92 mg/dL (ref 65–99)
GLUCOSE-CAPILLARY: 166 mg/dL — AB (ref 65–99)
Glucose-Capillary: 144 mg/dL — ABNORMAL HIGH (ref 65–99)

## 2016-12-16 LAB — CBC
HCT: 23.7 % — ABNORMAL LOW (ref 39.0–52.0)
HEMOGLOBIN: 7.8 g/dL — AB (ref 13.0–17.0)
MCH: 26.4 pg (ref 26.0–34.0)
MCHC: 32.9 g/dL (ref 30.0–36.0)
MCV: 80.3 fL (ref 78.0–100.0)
Platelets: 337 10*3/uL (ref 150–400)
RBC: 2.95 MIL/uL — AB (ref 4.22–5.81)
RDW: 16.6 % — ABNORMAL HIGH (ref 11.5–15.5)
WBC: 17.5 10*3/uL — AB (ref 4.0–10.5)

## 2016-12-16 LAB — GRAM STAIN

## 2016-12-16 LAB — BASIC METABOLIC PANEL
ANION GAP: 7 (ref 5–15)
BUN: 12 mg/dL (ref 6–20)
CALCIUM: 9.6 mg/dL (ref 8.9–10.3)
CO2: 30 mmol/L (ref 22–32)
Chloride: 98 mmol/L — ABNORMAL LOW (ref 101–111)
Creatinine, Ser: 0.63 mg/dL (ref 0.61–1.24)
Glucose, Bld: 119 mg/dL — ABNORMAL HIGH (ref 65–99)
POTASSIUM: 3.9 mmol/L (ref 3.5–5.1)
Sodium: 135 mmol/L (ref 135–145)

## 2016-12-16 MED ORDER — LIDOCAINE HCL 1 % IJ SOLN
INTRAMUSCULAR | Status: AC
  Filled 2016-12-16: qty 20

## 2016-12-16 NOTE — Progress Notes (Signed)
[place] for Infectious Disease    Date of Admission:  11/13/2016     ID: Antonio [name] is a 61 y.o. male with supraglottic laryngeal ca s/p resection, chemo, on pallaitive chemo admitted for sepsis found to have SMSA bactremia likely from port infection/ surround pocket involvement s/p removal on 10/24 on cefazolin. Being treated with cefazolin for complicated bacteremia but has had persistent fever since admit and leukocytosis since 10/27. 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 Has undergoing Chest CT and U/S of chest wall. He underwent L -thoracentesis by IR today with removal of 1.3L. Right loculated effusion still remains Medications:  . chlorhexidine  15 mL Mouth Rinse BID  . enoxaparin (LOVENOX) injection  40 mg Subcutaneous Q24H  . feeding supplement (OSMOLITE 1.5 CAL)  474 mL Per Tube TID WC  . free water  125 mL Per Tube Q4H  . gabapentin  300 mg Per Tube TID  . lidocaine      . mouth rinse  15 mL Mouth Rinse q12n4p  . metoCLOPramide  5 mg Per Tube TID AC  . metoprolol tartrate  75 mg Oral BID  . pantoprazole (PROTONIX) IV  40 mg Intravenous Q12H  . senna-docusate  1 tablet Oral BID    Objective: Vital signs in last 24 hours: Temp:  [98.1 F (36.7 C)-100 F (37.8 C)] 98.6 F (37 C) (11/03 1500) Pulse Rate:  [99-136] 108 (11/03 1500) Resp:  [18-20] 18 (11/03 1500) BP: (103-155)/(63-77) 132/77  (11/03 1500) SpO2:  [73 %-100 %] 97 % (11/03 1500) Weight:  [126 lb 8.7 oz (57.4 kg)] 126 lb 8.7 oz (57.4 kg) (11/03 0601)    Lab Results  Recent Labs  12/15/16 0449 12/16/16 0358  WBC 18.3* 17.5*  HGB 8.1* 7.8*  HCT 24.6* 23.7*  NA 134* 135  K 4.0 3.9  CL 94* 98*  CO2 31 30  BUN 13 12  CREATININE 0.55* 0.63    Microbiology: 11/2 blood cx ngtd at 24hr 11/3 pleural fluid pending Studies/Results: Dg Chest 1 View  Result Date: 12/16/2016 CLINICAL DATA:  Status post left thoracentesis.  Ex-smoker. EXAM: CHEST 1 VIEW COMPARISON:  Chest ultrasound obtained earlier today. Chest CT dated 12/15/2016 and portable chest dated 12/11/2016. FINDINGS: Significant decrease in left pleural fluid with only minimal residual fluid remaining. No pneumothorax. Right basilar pleural fluid and atelectasis with a less loculated appearance. Stable enlarged cardiac silhouette. Interval left PICC with its tip at the superior cavoatrial junction. Lower thoracic spine degenerative changes. IMPRESSION: 1. No pneumothorax following left thoracentesis. 2. Right basilar pleural fluid and atelectasis with less loculation. 3. Stable cardiomegaly. Electronically Signed   By: Claudie Revering M.D.   On: 12/16/2016 13:51   Ct Chest W Contrast  Result Date: 12/15/2016 CLINICAL DATA:  61 year old male with persistent shortness of breath, pleural effusions, fevers and bacteremia. Patient with metastatic  supraglottic laryngeal cancer and lung metastases. EXAM: CT CHEST WITH CONTRAST TECHNIQUE: Multidetector CT imaging of the chest was performed during intravenous contrast administration. CONTRAST:  52mL ISOVUE-300 IOPAMIDOL (ISOVUE-300) INJECTION 61% COMPARISON:  12/06/2016 and 11/13/2016 chest CTs. FINDINGS: Cardiovascular: Normal heart size. Coronary artery calcifications again identified. Thoracic aortic atherosclerotic calcifications noted without aneurysm. No pericardial effusion. A left PICC line is present with tip at the  superior cavoatrial junction. Mediastinum/Nodes: No significant change in enlarged bilateral mediastinal and hilar lymph nodes. No other changes identified. Lungs/Pleura: A large right pleural effusion is again noted but now appears loculated. An enlarging moderate to large left pleural effusion is noted. Compressive atelectasis of both lower lungs noted, right greater than left. Multiple pulmonary nodules are again identified and unchanged. Mild interlobular septal thickening is nonspecific but may represent mild edema. There is no evidence of pneumothorax Upper Abdomen: Two adjacent 2.7 cm hypodense lesions within the hepatic dome are unchanged from 12/06/2016 but new since 11/13/2016. A gastrostomy tube is present. Musculoskeletal: The superior aspect of a soft tissue mass involving the posterolateral left eleventh rib is noted. A lytic lesion within the T9 vertebral body new since 11/13/2016. IMPRESSION: 1. Large right pleural effusion again noted but now appears loculated. Compressive atelectasis of the right lower lung. 2. Enlarging moderate to large left pleural effusion with left lower lung atelectasis. 3. 2 adjacent 2.7 cm hypodense lesions within the hepatic dome, unchanged from 12/06/2016 but new since 11/13/2016. Although this could represent rapidly progressive metastatic disease, infection is not excluded given patient history. 4. Pulmonary, mediastinal/hilar lymphatic and left eleventh rib metastases as described. 5. T9 vertebral body lesion, new since 11/13/2016. This probably represents rapid development of a metastasis as this would be an unusual appearance of infection. Electronically Signed   By: Margarette Canada M.D.   On: 12/15/2016 16:35   [place] Chest Soft Tissue  Result Date: 12/16/2016 CLINICAL DATA:  62 year old male with recent right anterior chest wall port removal. Evaluation for abscess. EXAM: ULTRASOUND OF HEAD/NECK SOFT TISSUES TECHNIQUE: Ultrasound examination of the head and neck soft  tissues was performed in the area of clinical concern. COMPARISON:  None. FINDINGS: A draining skin wound is identified in the anterior right chest wall in the region of the patient's clinical symptoms. The is a small associated complex fluid collection measuring approximately 2.5 x 1.2 x 0.3 cm. This likely reflects the pocket in which the port was contain. There is no significant internal or peripheral inflammatory enhancement. No additional large abscess or drainable fluid collection is identified. IMPRESSION: 1. No definite abscess or drainable fluid collection with anterior chest wall. 2. Draining skin wound with a small associated complex fluid collection likely representing the pocket in which the recently removed port was contained. Electronically Signed   By: Kristopher Oppenheim M.D.   On: 12/16/2016 09:31     Assessment/Plan: 61yo M immunocompromised host due to metastatic cancer on palliative chemo admitted for sepsis 2/2 MSSA bacteremia associated with port s/p removal still having fevers plus leukocytosis  - chest CT shows bilateral plearal effusion. Agree with plan to do thoracentesis of both, in theory we worry this is source of infection as possible empyema  - ultrasound of chest wall does not appear to have new abscess and suspect that small fluid collection is where the pocket of previous port is. No surrounding stranding/inflammation. Continue to monitor  Leukocytosis = plan to continue with cefazolin. Await on pleural fluid culture  Fevers= maybe related to malignancy possibly  or at least multifactorial. Repeat imaging suggests new lesions in the last month appearing in liver and in thoracic lumbar region, lytic lesion  Will follow tomorrow to provide further Arloa Koh [facility] for Infectious Diseases Cell: [phone] Pager: [phone]  12/16/2016, 3:22 PM

## 2016-12-16 NOTE — Progress Notes (Signed)
PROGRESS NOTE  Antonio [name]  FXT:[id number] DOB: 1955-03-13 DOA: 12/03/2016 PCP: Maryellen Pile, MD  Outpatient Specialists: Oncology, [name]   Brief Narrative:  Antonio [name] is a 61 y.o. male with a history of supraglottic laryngeal CA s/p resection, chemo/radiation currently on palliative chemotherapy who was brought from home by EMS due to weakness, a fall, and confusion which was reported by his sister. He reports he slipped and fell on urine and denies any confusion. He reported chills and a productive cough to admitting MD. He denied fevers but had a temperature of 103.59F, heart rates 134-141, respirations 18-32, blood pressures 117/57-122/66, O2 saturations 91-96% on room air. Labs revealed WBC 3.7, hemoglobin 8.2, MCV 76.8, RDW 16.4, platelets 184, BUN 23, creatinine 1, albumin 2.5, lactic acid trending upwards to 1.92. Chest x-ray showed right base collapse with small pleural effusion. Urinalysis was negative for any signs of infection. He was given 3L of normal saline, empiric antibiotics and admitted. Blood cultures became positive for MSSA, antibiotics narrowed to ancef per ID recommendations. Pt became hypotensive and was transferred to the stepdown unit with CCM consult, though vitals have stabilized with fluids alone. Port was removed with purulent pocket noted on 10/24 and thoracentesis was performed 10/25 fluid consistent with exudate, pulmonary has seen and signed off.   Continue IV antibiotics as indicated by ID. Will place PICC line and will concretize discharge plan.  Subjective:   Patient with low grade temp. No nausea, no vomiting, no CP. Continue requiring oxygen supplementation.  Assessment & Plan:  Sepsis due to MSSA bacteremia due to port infection  - in a immunocompromised patient, port which was infected removed on 12/06/2016, he was negative for hepatitis or HIV.   -ID following and on board.  Currently on Ancef since 12/05/2016.  Negative transthoracic  echogram for any vegetations, TEE cannot be done due to laryngeal cancer.   -plan is for 17 more days of IV ancef (plan is for a total of 28 days) -repeat blood cx's so far neg -planning to place PICC line later today -continue supportive care. -Given continued fevers and further elevation of his WBCs, ct chest repeat and demonstrated bilateral pleural effusion, with loculation on the right -will performed thoracentesis and follow fluid analysis results. -ID re-involved in the case, will follow rec's. -chest [place] r/o abscess.  RLL opacity on imaging, along with right-sided pleural effusion.  CT chest done and thoracentesis obtained by pulmonary on 12/07/2016,could have compressive atelectasis versus pneumonia as per CT findings, fluid appears to be consistent with exudate. -continue current abx's. -patient with fever and elevation of WBC's -will perform thoracentesis -continue oxygen supplementation.  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 [name] oncology and palliative care following, for now wishes to pursue full code and palliative chemo if needed.   -If declines may transition to palliative care/hospice but not now.  Persistent tachycardia.  In no pain or distress, has been hydrated, stable TSH and echogram, He is on intermittent tube feeds via PEG tube and in between he is n.p.o.  -continue IVF's -HR improving overall; but still up -continue IV hydration.  Dysphagia -s/p PEG May 2018.  -Continue feeds thru PEG tube.  -Ok to give small amount of liquid po for pt comfort after oral care  Pancytopenia  -Suspect due to marrow suppression.   -Required 1 unit of packed RBC transfusion on 12/05/2016 -stable H&H since then.  -continue to monitor CBC intermittently  Fall with generalized weakness  -  due to weakness and deconditioning,  -continue supportive care -patient will like to go home; but with poor social  support. And will require continue IV antibiotic therapy for another 17 days or so. -patient in agreement to go to SNF at discharge. SW on board and aware   History of hypercalcemia of malignancy  -resolved for now after IVF's given -Follow electrolytes trend intermittently -continue free water six times a day through PEG  Peripheral neuropathy  -Continue Neurontin  Essential hypertension.  -Continue metoprolol 75 mg twice daily.   -BP stable -will monitor on telemetry  DVT prophylaxis: SCDs Code Status: Full Family Communication: No family at bedside today. Disposition Plan: Patient will have thoracentesis L and right; continue current abx's  Consultants:    Oncology, [name].  ID, Dr. Johnnye Sima  IR, Dr. Anselm Pancoast  CCM, Dr. Ashok Cordia   Palliative care team  Procedures:    Echocardiogram 12/06/2016:   Left ventricle: The cavity size was normal. Wall thickness was normal. Systolic function was normal. The estimated ejection  fraction was in the range of 55% to 60%. Wall motion was normal;  there were no regional wall motion abnormalities. Doppler  parameters are consistent with abnormal left ventricular  relaxation (grade 1 diastolic dysfunction).  Mitral valve: Calcified annulus.  Impressions: - Normal LV systolic function; mild diastolic dysfunction.   Antimicrobials:   Vancomycin 10/22 > 10/23  Zosyn 10/22  Cefepime 10/22 > 10/23   Ancef 10/23 >>   Objective:  Vitals:   12/16/16 1338 12/16/16 1443 12/16/16 1500 12/16/16 2129  BP: 103/63  132/77 126/71  Pulse: (!) 105  (!) 108 (!) 115  Resp: 18  18 18   Temp:   98.6 F (37 C) 99.3 F (37.4 C)  TempSrc:   Oral Oral  SpO2: 100% 94% 97% 94%  Weight:      Height:        Intake/Output Summary (Last 24 hours) at 12/16/16 2217 Last data filed at 12/16/16 2130  Gross per 24 hour  Intake          3593.25 ml  Output             1900 ml  Net          1693.25 ml   Filed Weights   12/14/16 0512 12/15/16 0450  12/16/16 0601  Weight: 54.4 kg (119 lb 14.9 oz) 54.4 kg (119 lb 14.9 oz) 57.4 kg (126 lb 8.7 oz)    Exam General: AAOX3, no CP, still requiring oxygen supplementation. Patient with low grade temp overnight Resp: some decrease bs at bases, no wheezing, positive rhonchi.  Heart: sinus tachycardia, no rubs, no gallops   Abd: soft, NT, ND, positive BS, peg tube in place  Skin: Skin exam unchanged from 11/2: positive dressing on his right chest from recent removed port-a-cath; no drainage or erythema appreciated. Patient with PEG tube in place, site clean; no significant drainage. Extremities: No edema, no cyanosis, no clubbing.  Data Reviewed: I have personally reviewed following labs and imaging studies  CBC:  Recent Labs Lab 12/10/16 0328 12/11/16 0339 12/13/16 0430 12/15/16 0449 12/16/16 0358  WBC 11.0* 14.0* 17.0* 18.3* 17.5*  HGB 8.4* 8.8* 8.5* 8.1* 7.8*  HCT 24.9* 26.7* 26.4* 24.6* 23.7*  MCV 79.6 81.4 81.5 80.4 80.3  PLT 259 279 335 315 774   Basic Metabolic Panel:  Recent Labs Lab 12/10/16 0328 12/11/16 0339 12/13/16 0430 12/15/16 0449 12/16/16 0358  NA 137 138 136 134* 135  K 3.5  4.5 4.1 4.0 3.9  CL 92* 95* 93* 94* 98*  CO2 35* 35* 32 31 30  GLUCOSE 105* 100* 93 102* 119*  BUN 11 11 11 13 12   CREATININE 0.63 0.61 0.55* 0.55* 0.63  CALCIUM 9.0 9.2 9.5 9.6 9.6  MG 1.7 1.9  --   --   --    GFR: Estimated Creatinine Clearance: 79 mL/min (by C-G formula based on SCr of 0.63 mg/dL).   Liver Function Tests:  Recent Labs Lab 12/13/16 0430  AST 58*  ALT 40  ALKPHOS 141*  BILITOT 0.7  PROT 6.0*  ALBUMIN 1.8*   CBG:  Recent Labs Lab 12/15/16 1607 12/15/16 2119 12/16/16 0748 12/16/16 1226 12/16/16 1721  GLUCAP 140* 160* 92 166* 144*   Urine analysis:    Component Value Date/Time   COLORURINE YELLOW 11/28/2016 1847   APPEARANCEUR CLEAR 12/06/2016 1847   LABSPEC 1.013 12/10/2016 1847   PHURINE 5.0 11/19/2016 1847   GLUCOSEU NEGATIVE 11/14/2016  1847   HGBUR NEGATIVE 12/13/2016 1847   BILIRUBINUR NEGATIVE 11/23/2016 1847   KETONESUR NEGATIVE 12/03/2016 1847   PROTEINUR NEGATIVE 12/01/2016 1847   NITRITE NEGATIVE 11/16/2016 1847   LEUKOCYTESUR NEGATIVE 11/13/2016 1847   Recent Results (from the past 240 hour(s))  Body fluid culture     Status: None   Collection Time: 12/07/16 10:31 AM  Result Value Ref Range Status   Specimen Description PLEURAL RIGHT  Final   Special Requests Immunocompromised  Final   Gram Stain   Final    ABUNDANT WBC PRESENT,BOTH PMN AND MONONUCLEAR NO ORGANISMS SEEN    Culture   Final    NO GROWTH 3 DAYS Performed at [facility], [place] [address] [place], [place] [address]    Report Status 12/10/2016 FINAL  Final  Culture, blood (Routine X 2) w Reflex to ID Panel     Status: None (Preliminary result)   Collection Time: 12/15/16  4:15 PM  Result Value Ref Range Status   Specimen Description BLOOD RIGHT ANTECUBITAL  Final   Special Requests IN PEDIATRIC BOTTLE Blood Culture adequate volume  Final   Culture   Final    NO GROWTH < 24 HOURS Performed at [facility], [place] [address], [place], [place] [address]    Report Status PENDING  Incomplete  Culture, blood (Routine X 2) w Reflex to ID Panel     Status: None (Preliminary result)   Collection Time: 12/15/16  4:22 PM  Result Value Ref Range Status   Specimen Description BLOOD RIGHT HAND  Final   Special Requests IN PEDIATRIC BOTTLE Blood Culture adequate volume  Final   Culture   Final    NO GROWTH < 24 HOURS Performed at [facility], [place] [address] [place], [place] [address]    Report Status PENDING  Incomplete  Gram stain     Status: None   Collection Time: 12/16/16  1:00 PM  Result Value Ref Range Status   Specimen Description PLEURAL LEFT  Final   Special Requests NONE  Final   Gram Stain   Final    FEW WBC PRESENT,BOTH PMN AND MONONUCLEAR NO ORGANISMS SEEN Performed at [facility], [address],  [place], [place] [address]    Report Status 12/16/2016 FINAL  Final      Radiology Studies: Dg Chest 1 View  Result Date: 12/16/2016 CLINICAL DATA:  Status post left thoracentesis.  Ex-smoker. EXAM: CHEST 1 VIEW COMPARISON:  Chest ultrasound  obtained earlier today. Chest CT dated 12/15/2016 and portable chest dated 12/11/2016. FINDINGS: Significant decrease in left pleural fluid with only minimal residual fluid remaining. No pneumothorax. Right basilar pleural fluid and atelectasis with a less loculated appearance. Stable enlarged cardiac silhouette. Interval left PICC with its tip at the superior cavoatrial junction. Lower thoracic spine degenerative changes. IMPRESSION: 1. No pneumothorax following left thoracentesis. 2. Right basilar pleural fluid and atelectasis with less loculation. 3. Stable cardiomegaly. Electronically Signed   By: Claudie Revering M.D.   On: 12/16/2016 13:51   Ct Chest W Contrast  Result Date: 12/15/2016 CLINICAL DATA:  61 year old male with persistent shortness of breath, pleural effusions, fevers and bacteremia. 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 EXAM: CT CHEST WITH CONTRAST TECHNIQUE: Multidetector CT imaging of the chest was performed during intravenous contrast administration. CONTRAST:  37mL ISOVUE-300 IOPAMIDOL (ISOVUE-300) INJECTION 61% COMPARISON:  12/06/2016 and 11/13/2016 chest CTs. FINDINGS: Cardiovascular: Normal heart size. Coronary artery calcifications again identified. Thoracic aortic atherosclerotic calcifications noted without aneurysm. No pericardial effusion. A left PICC line is present with tip at the superior cavoatrial junction. Mediastinum/Nodes: No significant change in enlarged bilateral mediastinal and hilar lymph nodes. No other changes identified. Lungs/Pleura: A large right pleural effusion is again noted but now appears loculated. An enlarging moderate to large left pleural effusion is noted. Compressive atelectasis of  both lower lungs noted, right greater than left. Multiple pulmonary nodules are again identified and unchanged. Mild interlobular septal thickening is nonspecific but may represent mild edema. There is no evidence of pneumothorax Upper Abdomen: Two adjacent 2.7 cm hypodense lesions within the hepatic dome are unchanged from 12/06/2016 but new since 11/13/2016. A gastrostomy tube is present. Musculoskeletal: The superior aspect of a soft tissue mass involving the posterolateral left eleventh rib is noted. A lytic lesion within the T9 vertebral body new since 11/13/2016. IMPRESSION: 1. Large right pleural effusion again noted but now appears loculated. Compressive atelectasis of the right lower lung. 2. Enlarging moderate to large left pleural effusion with left lower lung atelectasis. 3. 2 adjacent 2.7 cm hypodense lesions within the hepatic dome, unchanged from 12/06/2016 but new since 11/13/2016. Although this could represent rapidly progressive metastatic disease, infection is not excluded given patient history. 4. Pulmonary, mediastinal/hilar lymphatic and left eleventh rib metastases as described. 5. T9 vertebral body lesion, new since 11/13/2016. This probably represents rapid development of a metastasis as this would be an unusual appearance of infection. Electronically Signed   By: Margarette Canada M.D.   On: 12/15/2016 16:35   [place] Chest Soft Tissue  Result Date: 12/16/2016 CLINICAL DATA:  61 year old male with recent right anterior chest wall port removal. Evaluation for abscess. EXAM: ULTRASOUND OF HEAD/NECK SOFT TISSUES TECHNIQUE: Ultrasound examination of the head and neck soft tissues was performed in the area of clinical concern. COMPARISON:  None. FINDINGS: A draining skin wound is identified in the anterior right chest wall in the region of the patient's clinical symptoms. The is a small associated complex fluid collection measuring approximately 2.5 x 1.2 x 0.3 cm. This likely reflects the pocket in  which the port was contain. There is no significant internal or peripheral inflammatory enhancement. No additional large abscess or drainable fluid collection is identified. IMPRESSION: 1. No definite abscess or drainable fluid collection with anterior chest wall. 2. Draining skin wound with a small associated complex fluid collection likely representing the pocket in which the recently removed port was contained. Electronically Signed   By: Roanna Epley  Jeanmarie Plant M.D.   On: 12/16/2016 09:31    Scheduled Meds: . chlorhexidine  15 mL Mouth Rinse BID  . enoxaparin (LOVENOX) injection  40 mg Subcutaneous Q24H  . feeding supplement (OSMOLITE 1.5 CAL)  474 mL Per Tube TID WC  . free water  125 mL Per Tube Q4H  . gabapentin  300 mg Per Tube TID  . mouth rinse  15 mL Mouth Rinse q12n4p  . metoCLOPramide  5 mg Per Tube TID AC  . metoprolol tartrate  75 mg Oral BID  . pantoprazole (PROTONIX) IV  40 mg Intravenous Q12H  . senna-docusate  1 tablet Oral BID   Continuous Infusions: . sodium chloride 75 mL/hr at 12/16/16 2055  .  ceFAZolin (ANCEF) IV Stopped (12/16/16 1818)     LOS: 12 days   Signature  Barton Dubois M.D  [phone]

## 2016-12-16 NOTE — Procedures (Signed)
PROCEDURE SUMMARY:  Patient's O2 sat was 77% on room air upon arrival to [place].  Patient placed on 2 liters O2 nasal cannula. Saturation improved to 86%  Ultrasound done of bilateral chest.  Left chest with large effusion and large window to allow for safe thoracentesis.  Right chest with effusion that appears smaller on [place] and appears to be more loculated with atelectatic lung tissue in the fluid window.  Discussed findings with Dr. Dyann Kief.  He Ok'd to proceed with left thoracentesis today and return tomorrow for right thoracentesis.  Successful US guided left thoracentesis. Yielded 1.3 liters of clear red fluid. Pt tolerated procedure well. No immediate complications.  Specimen was sent for labs.  Post procedure chest X-ray reveals no pneumothorax  Dawana Asper S Anvi Mangal PA-C 12/16/2016 1:51 PM

## 2016-12-17 ENCOUNTER — Inpatient Hospital Stay (HOSPITAL_COMMUNITY): Payer: Medicare Other

## 2016-12-17 ENCOUNTER — Other Ambulatory Visit: Payer: Self-pay

## 2016-12-17 LAB — GLUCOSE, CAPILLARY
GLUCOSE-CAPILLARY: 141 mg/dL — AB (ref 65–99)
Glucose-Capillary: 159 mg/dL — ABNORMAL HIGH (ref 65–99)
Glucose-Capillary: 90 mg/dL (ref 65–99)
Glucose-Capillary: 90 mg/dL (ref 65–99)
Glucose-Capillary: 169 mg/dL — ABNORMAL HIGH (ref 65–99)

## 2016-12-17 LAB — BODY FLUID CELL COUNT WITH DIFFERENTIAL
LYMPHS FL: 2 %
Monocyte-Macrophage-Serous Fluid: 22 % — ABNORMAL LOW (ref 50–90)
NEUTROPHIL FLUID: 76 % — AB (ref 0–25)
Total Nucleated Cell Count, Fluid: 67 cu mm (ref 0–1000)

## 2016-12-17 NOTE — Progress Notes (Signed)
PROGRESS NOTE  Antonio [name]  ION:[id number] DOB: [birth year] DOA: 11/17/2016 PCP: Maryellen Pile, MD  Outpatient Specialists: Oncology, [name]  Brief Narrative:  Antonio [name] is a 61 y.o. male with a history of supraglottic laryngeal CA s/p resection, chemo/radiation currently on palliative chemotherapy who was brought from home by EMS due to weakness, a fall, and confusion which was reported by his sister. He reports he slipped and fell on urine and denies any confusion. He reported chills and a productive cough to admitting MD. He denied fevers but had a temperature of 103.80F, heart rates 134-141, respirations 18-32, blood pressures 117/57-122/66, O2 saturations 91-96% on room air. Labs revealed WBC 3.7, hemoglobin 8.2, MCV 76.8, RDW 16.4, platelets 184, BUN 23, creatinine 1, albumin 2.5, lactic acid trending upwards to 1.92. Chest x-ray showed right base collapse with small pleural effusion. Urinalysis was negative for any signs of infection. He was given 3L of normal saline, empiric antibiotics and admitted. Blood cultures became positive for MSSA, antibiotics narrowed to ancef per ID recommendations. Pt became hypotensive and was transferred to the stepdown unit with CCM consult, though vitals have stabilized with fluids alone. Port was removed with purulent pocket noted on 10/24 and thoracentesis was performed 10/25 fluid consistent with exudate, pulmonary has seen and signed off.   Continue IV antibiotics as indicated by ID. Will place PICC line and will concretize discharge plan.  Subjective:   Afebrile overnight, denies chest pain, no nausea, no vomiting, breathing is stable.  No abdominal pain.  Assessment & Plan:  Sepsis due to MSSA bacteremia due to port infection  - in a immunocompromised patient, port which was infected removed on 12/06/2016, he was negative for hepatitis or HIV.   -ID following and on board.  Currently on Ancef since 12/05/2016.  Negative transthoracic  echogram for any vegetations, TEE cannot be done due to laryngeal cancer.   -plan is for 16 more days of IV ancef (plan is for a total of 28 days) -repeat blood cx's so far remains no growth -planning to place PICC line later today -continue supportive care. -Given continued fevers and further elevation of his WBCs, ct chest repeat and demonstrated bilateral pleural effusion, with loculation on the right -will performed thoracentesis and follow fluid analysis results. -ID re-involved in the case, will follow rec's. -chest [place] has r/o abscess.  RLL opacity on imaging, along with right-sided pleural effusion.  CT chest done and thoracentesis obtained by pulmonary on 12/07/2016,could have compressive atelectasis versus pneumonia as per CT findings, fluid appears to be consistent with exudate. -continue current abx's. -patient with fever and elevation of WBC's -Status post left thoracentesis and will perform right thoracentesis today; follow fluid analysis results. -continue oxygen supplementation and wean as tolerated.  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 [name] oncology and palliative care following, for now wishes to pursue full code and palliative chemo if needed.   -If declines may transition to palliative care/hospice but not now.  Persistent tachycardia.  In no pain or distress, has been hydrated, stable TSH and echogram, He is on intermittent tube feeds via PEG tube and in between he is n.p.o.  -continue IVF's -HR improving overall; but still up -continue IV hydration.  Dysphagia -s/p PEG May 2018.  -Continue feeds thru PEG tube.  -Ok to give small amount of liquid po for pt comfort after oral care  Pancytopenia  -Suspect due to marrow suppression.   -Required 1 unit of packed RBC  transfusion on 12/05/2016 -stable H&H since then.  -continue to monitor CBC intermittently  Fall with generalized weakness  -due  to weakness and deconditioning,  -continue supportive care -patient will like to go home; but with poor social support. And will require continue IV antibiotic therapy for another 16 days. -patient in agreement to go to SNF at discharge. SW on board and aware   History of hypercalcemia of malignancy  -resolved for now after IVF's given -Follow electrolytes trend intermittently -continue free water six times a day through PEG  Peripheral neuropathy  -Continue Neurontin  Essential hypertension.  -Continue metoprolol 75 mg twice daily.   -BP stable -will monitor on telemetry  DVT prophylaxis: SCDs Code Status: Full Family Communication: No family at bedside today. Disposition Plan: Patient will have thoracentesis of right loculated effusion today; continue current abx's and follow fluid results from thoracentesis. Continue supportive care and repeat CBC in am.  Consultants:    Oncology, [name].  ID, Dr. Johnnye Sima  IR, Dr. Anselm Pancoast  CCM, Dr. Ashok Cordia   Palliative care team  Procedures:    Echocardiogram 12/06/2016:   Left ventricle: The cavity size was normal. Wall thickness was normal. Systolic function was normal. The estimated ejection  fraction was in the range of 55% to 60%. Wall motion was normal;  there were no regional wall motion abnormalities. Doppler  parameters are consistent with abnormal left ventricular  relaxation (grade 1 diastolic dysfunction).  Mitral valve: Calcified annulus.  Impressions: - Normal LV systolic function; mild diastolic dysfunction.   Antimicrobials:   Vancomycin 10/22 > 10/23  Zosyn 10/22  Cefepime 10/22 > 10/23   Ancef 10/23 >>   Objective:  Vitals:   12/16/16 2129 12/17/16 0543 12/17/16 0900 12/17/16 1446  BP: 126/71 126/72 122/78 111/60  Pulse: (!) 115 (!) 109 (!) 107 97  Resp: 18 18 18 18   Temp: 99.3 F (37.4 C) 98.7 F (37.1 C)  99 F (37.2 C)  TempSrc: Oral Oral  Oral  SpO2: 94% 95% 95% 92%  Weight:  54.3 kg (119  lb 11.4 oz)    Height:        Intake/Output Summary (Last 24 hours) at 12/17/2016 1705 Last data filed at 12/17/2016 1606 Gross per 24 hour  Intake 4183.25 ml  Output 700 ml  Net 3483.25 ml   Filed Weights   12/15/16 0450 12/16/16 0601 12/17/16 0543  Weight: 54.4 kg (119 lb 14.9 oz) 57.4 kg (126 lb 8.7 oz) 54.3 kg (119 lb 11.4 oz)    Exam General: Feeling much better overall.  No chest pain, still requiring oxygen supplementation.  No nausea, no vomiting, no abdominal pain.  Patient is alert, awake and oriented x3.  Currently afebrile. Resp: Fair air movement bilaterally, no wheezing, no crackles, positive rhonchi.   Heart: Sinus tachycardia (with improvement in his rate control), no rubs, no gallops, no JVD.   Abd: Soft, nontender, nondistended positive bowel sounds, PEG tube in place Skin: Skin exam unchanged from 11/3: positive dressing on his right chest from recent removed port-a-cath; no significant drainage or erythema appreciated. Patient with PEG tube in place, site remains clean; no drainage appreciated. Extremities: No edema, no cyanosis, no clubbing.    Data Reviewed: I have personally reviewed following labs and imaging studies  CBC: Recent Labs  Lab 12/11/16 0339 12/13/16 0430 12/15/16 0449 12/16/16 0358  WBC 14.0* 17.0* 18.3* 17.5*  HGB 8.8* 8.5* 8.1* 7.8*  HCT 26.7* 26.4* 24.6* 23.7*  MCV 81.4 81.5 80.4 80.3  PLT 279 335 315 846   Basic Metabolic Panel: Recent Labs  Lab 12/11/16 0339 12/13/16 0430 12/15/16 0449 12/16/16 0358  NA 138 136 134* 135  K 4.5 4.1 4.0 3.9  CL 95* 93* 94* 98*  CO2 35* 32 31 30  GLUCOSE 100* 93 102* 119*  BUN 11 11 13 12   CREATININE 0.61 0.55* 0.55* 0.63  CALCIUM 9.2 9.5 9.6 9.6  MG 1.9  --   --   --    GFR: Estimated Creatinine Clearance: 75.4 mL/min (by C-G formula based on SCr of 0.63 mg/dL).   Liver Function Tests: Recent Labs  Lab 12/13/16 0430  AST 58*  ALT 40  ALKPHOS 141*  BILITOT 0.7  PROT 6.0*  ALBUMIN  1.8*   CBG: Recent Labs  Lab 12/16/16 1226 12/16/16 1721 12/16/16 2127 12/17/16 0741 12/17/16 1151  GLUCAP 166* 144* 159* 90 141*   Urine analysis:    Component Value Date/Time   COLORURINE YELLOW 11/22/2016 1847   APPEARANCEUR CLEAR 12/07/2016 1847   LABSPEC 1.013 11/15/2016 1847   PHURINE 5.0 12/11/2016 1847   GLUCOSEU NEGATIVE 11/28/2016 1847   HGBUR NEGATIVE 12/03/2016 1847   BILIRUBINUR NEGATIVE 11/18/2016 1847   KETONESUR NEGATIVE 11/19/2016 1847   PROTEINUR NEGATIVE 12/12/2016 1847   NITRITE NEGATIVE 11/15/2016 1847   LEUKOCYTESUR NEGATIVE 12/07/2016 1847   Recent Results (from the past 240 hour(s))  Culture, blood (Routine X 2) w Reflex to ID Panel     Status: None (Preliminary result)   Collection Time: 12/15/16  4:15 PM  Result Value Ref Range Status   Specimen Description BLOOD RIGHT ANTECUBITAL  Final   Special Requests IN PEDIATRIC BOTTLE Blood Culture adequate volume  Final   Culture   Final    NO GROWTH 2 DAYS Performed at [facility], [place] [address], [place], [place] [address]    Report Status PENDING  Incomplete  Culture, blood (Routine X 2) w Reflex to ID Panel     Status: None (Preliminary result)   Collection Time: 12/15/16  4:22 PM  Result Value Ref Range Status   Specimen Description BLOOD RIGHT HAND  Final   Special Requests IN PEDIATRIC BOTTLE Blood Culture adequate volume  Final   Culture   Final    NO GROWTH 2 DAYS Performed at [facility], [place] [address] [place], [place] [address]    Report Status PENDING  Incomplete  Culture, body fluid-bottle     Status: None (Preliminary result)   Collection Time: 12/16/16  1:00 PM  Result Value Ref Range Status   Specimen Description PLEURAL LEFT  Final   Special Requests NONE  Final   Culture   Final    NO GROWTH < 24 HOURS Performed at [facility], [place] [address], [place], [place] [address]    Report Status PENDING  Incomplete  Gram stain     Status: None   Collection  Time: 12/16/16  1:00 PM  Result Value Ref Range Status   Specimen Description PLEURAL LEFT  Final   Special Requests NONE  Final   Gram Stain   Final    FEW WBC PRESENT,BOTH PMN AND MONONUCLEAR NO ORGANISMS SEEN Performed at [facility], [address] [place], [place] [address]    Report Status 12/16/2016 FINAL  Final  Body fluid culture     Status: None (Preliminary result)   Collection Time: 12/17/16  9:00 AM  Result Value Ref Range Status  Specimen Description PLEURAL RIGHT  Final   Special Requests NONE  Final   Gram Stain   Final    NO WBC SEEN NO ORGANISMS SEEN Performed at [facility], [address] [place], [place] [address]    Culture PENDING  Incomplete   Report Status PENDING  Incomplete      Radiology Studies: Dg Chest 1 View  Result Date: 12/16/2016 CLINICAL DATA:  Status post left thoracentesis.  Ex-smoker. EXAM: CHEST 1 VIEW COMPARISON:  Chest ultrasound obtained earlier today. Chest CT dated 12/15/2016 and portable chest dated 12/11/2016. FINDINGS: Significant decrease in left pleural fluid with only minimal residual fluid remaining. No pneumothorax. Right basilar pleural fluid and atelectasis with a less loculated appearance. Stable enlarged cardiac silhouette. Interval left PICC with its tip at the superior cavoatrial junction. Lower thoracic spine degenerative changes. IMPRESSION: 1. No pneumothorax following left thoracentesis. 2. Right basilar pleural fluid and atelectasis with less loculation. 3. Stable cardiomegaly. Electronically Signed   By: Claudie Revering M.D.   On: 12/16/2016 13:51   Dg Chest Port 1 View  Result Date: 12/17/2016 CLINICAL DATA:  Status post thoracentesis EXAM: PORTABLE CHEST 1 VIEW COMPARISON:  12/16/2016 FINDINGS: Stable left PICC. Stable right pleural effusion. No evidence of pneumothorax. Increasing airspace disease at the left lung base. Left pleural effusion is increased and is small. IMPRESSION: No pneumothorax post  right thoracentesis. Increasing left effusion and basilar airspace disease. Electronically Signed   By: Marybelle Killings M.D.   On: 12/17/2016 11:35   [place] Chest Soft Tissue  Result Date: 12/16/2016 CLINICAL DATA:  61 year old male with recent right anterior chest wall port removal. Evaluation for abscess. EXAM: ULTRASOUND OF HEAD/NECK SOFT TISSUES TECHNIQUE: Ultrasound examination of the head and neck soft tissues was performed in the area of clinical concern. COMPARISON:  None. FINDINGS: A draining skin wound is identified in the anterior right chest wall in the region of the patient's clinical symptoms. The is a small associated complex fluid collection measuring approximately 2.5 x 1.2 x 0.3 cm. This likely reflects the pocket in which the port was contain. There is no significant internal or peripheral inflammatory enhancement. No additional large abscess or drainable fluid collection is identified. IMPRESSION: 1. No definite abscess or drainable fluid collection with anterior chest wall. 2. Draining skin wound with a small associated complex fluid collection likely representing the pocket in which the recently removed port was contained. Electronically Signed   By: Kristopher Oppenheim M.D.   On: 12/16/2016 09:31   US Thoracentesis Asp Pleural Space W/img Guide  Result Date: 12/17/2016 INDICATION: Right pleural effusion EXAM: ULTRASOUND GUIDED RIGHT THORACENTESIS MEDICATIONS: None. COMPLICATIONS: None immediate. PROCEDURE: An ultrasound guided thoracentesis was thoroughly discussed with the patient and questions answered. The benefits, risks, alternatives and complications were also discussed. The patient understands and wishes to proceed with the procedure. Written consent was obtained. Ultrasound was performed to localize and mark an adequate pocket of fluid in the right chest. A loculated right pleural effusion was noted. The area was then prepped and draped in the normal sterile fashion. 1% Lidocaine was used  for local anesthesia. Under ultrasound guidance a Safe-T-Centesis catheter was introduced. Thoracentesis was performed. The catheter was removed and a dressing applied. FINDINGS: A total of approximately 60 cc of clear yellow fluid was removed. Samples were sent to the laboratory as requested by the clinical team. IMPRESSION: Successful ultrasound guided right thoracentesis yielding 60 cc of pleural fluid. Electronically Signed   By: Arnell Sieving  Hoss M.D.   On: 12/17/2016 11:36   US Thoracentesis Asp Pleural Space W/img Guide  Result Date: 12/17/2016 INDICATION: Supraglottic laryngeal cancer. Hypoxia. Bilateral pleural effusions. Request for diagnostic and therapeutic thoracentesis. EXAM: ULTRASOUND GUIDED LEFT THORACENTESIS MEDICATIONS: 1% Lidocaine = 10 mL COMPLICATIONS: None immediate. PROCEDURE: An ultrasound guided thoracentesis was thoroughly discussed with the patient and questions answered. The benefits, risks, alternatives and complications were also discussed. The patient understands and wishes to proceed with the procedure. Written consent was obtained. Ultrasound was performed to localize and mark an adequate pocket of fluid in the left chest. The area was then prepped and draped in the normal sterile fashion. 1% Lidocaine was used for local anesthesia. Under ultrasound guidance a 6 Fr Safe-T-Centesis catheter was introduced. Thoracentesis was performed. The catheter was removed and a dressing applied. FINDINGS: A total of approximately 1.3 liters of clear red fluid was removed. Samples were sent to the laboratory as requested by the clinical team. IMPRESSION: Successful ultrasound guided left thoracentesis yielding 1.3 liters of pleural fluid. Post procedure chest X-ray revealed no pneumothorax. Read by:  Gareth Eagle, PA-C Electronically Signed   By: Marybelle Killings M.D.   On: 12/16/2016 14:02    Scheduled Meds: . chlorhexidine  15 mL Mouth Rinse BID  . enoxaparin (LOVENOX) injection  40 mg  Subcutaneous Q24H  . feeding supplement (OSMOLITE 1.5 CAL)  474 mL Per Tube TID WC  . free water  125 mL Per Tube Q4H  . gabapentin  300 mg Per Tube TID  . mouth rinse  15 mL Mouth Rinse q12n4p  . metoCLOPramide  5 mg Per Tube TID AC  . metoprolol tartrate  75 mg Oral BID  . pantoprazole (PROTONIX) IV  40 mg Intravenous Q12H  . senna-docusate  1 tablet Oral BID   Continuous Infusions: . sodium chloride 75 mL/hr at 12/17/16 0448  .  ceFAZolin (ANCEF) IV Stopped (12/17/16 1143)     LOS: 13 days   Signature  Barton Dubois M.D  [phone]

## 2016-12-17 NOTE — Procedures (Signed)
Right thoracentesis 60 cc CXR pending EBL 0 Comp 0

## 2016-12-17 NOTE — Progress Notes (Addendum)
Attempted to wean patient to room air and sat's were maintaining at 92%. Then sats dropped to 75% with brief ambulation to BSC, O2 sat increased to 95% on 2L of O2.  Barbee Shropshire. Brigitte Pulse, RN

## 2016-12-18 ENCOUNTER — Other Ambulatory Visit: Payer: Self-pay

## 2016-12-18 ENCOUNTER — Ambulatory Visit: Payer: Medicare Other

## 2016-12-18 ENCOUNTER — Ambulatory Visit: Payer: Self-pay | Admitting: Hematology and Oncology

## 2016-12-18 ENCOUNTER — Other Ambulatory Visit: Payer: Self-pay | Admitting: *Deleted

## 2016-12-18 LAB — CBC
HCT: 24.6 % — ABNORMAL LOW (ref 39.0–52.0)
Hemoglobin: 7.9 g/dL — ABNORMAL LOW (ref 13.0–17.0)
MCH: 26.2 pg (ref 26.0–34.0)
MCHC: 32.1 g/dL (ref 30.0–36.0)
MCV: 81.5 fL (ref 78.0–100.0)
PLATELETS: 365 10*3/uL (ref 150–400)
RBC: 3.02 MIL/uL — AB (ref 4.22–5.81)
RDW: 16.9 % — ABNORMAL HIGH (ref 11.5–15.5)
WBC: 15.4 10*3/uL — ABNORMAL HIGH (ref 4.0–10.5)

## 2016-12-18 LAB — GLUCOSE, CAPILLARY
GLUCOSE-CAPILLARY: 90 mg/dL (ref 65–99)
Glucose-Capillary: 119 mg/dL — ABNORMAL HIGH (ref 65–99)
Glucose-Capillary: 173 mg/dL — ABNORMAL HIGH (ref 65–99)
Glucose-Capillary: 189 mg/dL — ABNORMAL HIGH (ref 65–99)

## 2016-12-18 MED ORDER — IPRATROPIUM-ALBUTEROL 0.5-2.5 (3) MG/3ML IN SOLN
3.0000 mL | Freq: Two times a day (BID) | RESPIRATORY_TRACT | Status: DC
  Administered 2016-12-18: 3 mL via RESPIRATORY_TRACT
  Filled 2016-12-18: qty 3

## 2016-12-18 MED ORDER — METRONIDAZOLE IN NACL 5-0.79 MG/ML-% IV SOLN
500.0000 mg | Freq: Three times a day (TID) | INTRAVENOUS | Status: DC
  Administered 2016-12-18 – 2016-12-19 (×3): 500 mg via INTRAVENOUS
  Filled 2016-12-18 (×6): qty 100

## 2016-12-18 MED ORDER — PANTOPRAZOLE SODIUM 40 MG PO PACK
40.0000 mg | PACK | Freq: Two times a day (BID) | ORAL | Status: DC
  Administered 2016-12-18 (×2): 40 mg
  Filled 2016-12-18 (×2): qty 20

## 2016-12-18 NOTE — Progress Notes (Signed)
Physical Therapy Treatment Patient Details Name: Antonio [name] MRN: [id number] DOB: 15-Mar-1955 Today's Date: 12/18/2016    History of Present Illness  61 y.o. male with medical history significant of supraglottic cancer(s/p resection, chemotherapy, radiation,and PEG followed by [name]), COPD, sickle cell trait; who presents with complaints of generalized body aches, fall. Dx of sepsis due to PNA. Recent admission 10/1-10/7/18 with hypercalcemia.     PT Comments    Required min assist with HOB elevated for supine to sit, requiring 25% VC's for hand placement. Required min guard for sit to stand with 25% VC's for hand placement. O2 and HR monitored during treatment, patient on 2L of oxygen. O2 dropped to 70% on room air with ambulation.Noted increased narrow/dry pitched breathing.   Ambulated 50 feet with rolling walker, requiring min guard and 25% VC's for safety with turns and completion. Positioned in sitting in recliner on 2L of oxygen.    Follow Up Recommendations  SNF     Equipment Recommendations  Rolling walker with 5" wheels    Recommendations for Other Services       Precautions / Restrictions Precautions Precautions: Fall Precaution Comments: pt reports having B knee braces at home Restrictions Weight Bearing Restrictions: No    Mobility  Bed Mobility Overal bed mobility: Needs Assistance Bed Mobility: Supine to Sit     Supine to sit: HOB elevated;Min assist        Transfers Overall transfer level: Needs assistance Equipment used: Rolling walker (2 wheeled) Transfers: Sit to/from Stand Sit to Stand: Min assist Stand pivot transfers: Min assist       General transfer comment: 25% VC's on safety with turns prior to sit  Ambulation/Gait Ambulation/Gait assistance: Min assist Ambulation Distance (Feet): 50 Feet Assistive device: Rolling walker (2 wheeled) Gait Pattern/deviations: Step-through pattern;Decreased stride length Gait velocity:  decreased Gait velocity interpretation: Below normal speed for age/gender General Gait Details: displayed dyspnea and fatigue. Unsteady gait, required +2 assist for safety and equipment. Monitored 02, 70% room air, and monitored HR 111   Stairs            Wheelchair Mobility    Modified Rankin (Stroke Patients Only)       Balance                                            Cognition Arousal/Alertness: Awake/alert Behavior During Therapy: WFL for tasks assessed/performed Overall Cognitive Status: Within Functional Limits for tasks assessed                                        Exercises      General Comments        Pertinent Vitals/Pain Pain Assessment: 0-10 Pain Score: 9  Pain Location: throat Pain Descriptors / Indicators: Grimacing Pain Intervention(s): Monitored during session;Repositioned    Home Living                      Prior Function            PT Goals (current goals can now be found in the care plan section)      Frequency    Min 3X/week      PT Plan Current plan remains appropriate    Co-evaluation  AM-PAC PT "6 Clicks" Daily Activity  Outcome Measure  Difficulty turning over in bed (including adjusting bedclothes, sheets and blankets)?: A Lot Difficulty moving from lying on back to sitting on the side of the bed? : A Lot Difficulty sitting down on and standing up from a chair with arms (e.g., wheelchair, bedside commode, etc,.)?: A Lot Help needed moving to and from a bed to chair (including a wheelchair)?: A Lot Help needed walking in hospital room?: A Lot Help needed climbing 3-5 steps with a railing? : Total 6 Click Score: 11    End of Session Equipment Utilized During Treatment: Gait belt Activity Tolerance: Patient limited by fatigue Patient left: in chair;with call bell/phone within reach;with chair alarm set Nurse Communication: Mobility status PT Visit  Diagnosis: Difficulty in walking, not elsewhere classified (R26.2);Muscle weakness (generalized) (M62.81)     Time: 9147-8295 PT Time Calculation (min) (ACUTE ONLY): 28 min  Charges:  $Gait Training: 8-22 mins $Therapeutic Activity: 8-22 mins                    G Codes:       Almond Lint, SPTA [facility] [place]  PTA [facility]  Acute  Rehab Pager      [phone]

## 2016-12-18 NOTE — Progress Notes (Signed)
PHARMACY CONSULT NOTE FOR:  OUTPATIENT  PARENTERAL ANTIBIOTIC THERAPY (OPAT)  Indication: bacteremia Regimen: ancef 2gm IV q8h End date: 01/02/17  Indication: cellulitis/wound Regimen: metronidazole End date: 12/28/16  IV antibiotic discharge orders are pended. To discharging provider:  please sign these orders via discharge navigator,  Select New Orders & click on the button choice - Manage This Unsigned Work.     Thank you for allowing pharmacy to be a part of this patient's care.  Lynelle Doctor 12/18/2016, 2:47 PM

## 2016-12-18 NOTE — Progress Notes (Signed)
Subjective:  He is requesting me to get his walker so he can walk around   Antibiotics:  Anti-infectives (From admission, onward)   Start     Dose/Rate Route Frequency Ordered Stop   12/18/16 1500  metroNIDAZOLE (FLAGYL) IVPB 500 mg     500 mg 100 mL/hr over 60 Minutes Intravenous Every 8 hours 12/18/16 1454 12/28/16 1459   12/06/16 1000  vancomycin (VANCOCIN) IVPB 750 mg/150 ml premix  Status:  Discontinued     750 mg 150 mL/hr over 60 Minutes Intravenous Every 12 hours 12/05/16 0008 12/05/16 0009   12/05/16 1800  ceFAZolin (ANCEF) IVPB 2g/100 mL premix     2 g 200 mL/hr over 30 Minutes Intravenous Every 8 hours 12/05/16 1122     12/05/16 1000  vancomycin (VANCOCIN) IVPB 750 mg/150 ml premix  Status:  Discontinued     750 mg 150 mL/hr over 60 Minutes Intravenous Every 12 hours 12/05/16 0009 12/05/16 1118   12/05/16 0200  ceFEPIme (MAXIPIME) 1 g in dextrose 5 % 50 mL IVPB  Status:  Discontinued     1 g 100 mL/hr over 30 Minutes Intravenous Every 8 hours 12/05/16 0009 12/05/16 1118   11/28/2016 1930  vancomycin (VANCOCIN) IVPB 1000 mg/200 mL premix     1,000 mg 200 mL/hr over 60 Minutes Intravenous  Once 11/28/2016 1915 11/25/2016 2238   12/11/2016 1930  piperacillin-tazobactam (ZOSYN) IVPB 3.375 g     3.375 g 100 mL/hr over 30 Minutes Intravenous  Once 12/03/2016 1915 12/03/2016 2001      Medications: Scheduled Meds: . chlorhexidine  15 mL Mouth Rinse BID  . enoxaparin (LOVENOX) injection  40 mg Subcutaneous Q24H  . feeding supplement (OSMOLITE 1.5 CAL)  474 mL Per Tube TID WC  . free water  125 mL Per Tube Q4H  . gabapentin  300 mg Per Tube TID  . ipratropium-albuterol  3 mL Nebulization BID  . mouth rinse  15 mL Mouth Rinse q12n4p  . metoCLOPramide  5 mg Per Tube TID AC  . metoprolol tartrate  75 mg Oral BID  . pantoprazole sodium  40 mg Per Tube BID  . senna-docusate  1 tablet Oral BID   Continuous Infusions: . sodium chloride 75 mL/hr at 12/18/16 0105  .   ceFAZolin (ANCEF) IV Stopped (12/18/16 1830)  . metronidazole Stopped (12/18/16 1830)   PRN Meds:.acetaminophen **OR** [DISCONTINUED] acetaminophen, ipratropium-albuterol, lidocaine, lidocaine-prilocaine, morphine, MUSCLE RUB, ondansetron **OR** ondansetron (ZOFRAN) IV, sodium chloride flush    Objective: Weight change: 2 lb 10.3 oz (1.2 kg)  Intake/Output Summary (Last 24 hours) at 12/18/2016 1852 Last data filed at 12/18/2016 1508 Gross per 24 hour  Intake 2216.5 ml  Output 850 ml  Net 1366.5 ml   Blood pressure 120/72, pulse 100, temperature 98.5 F (36.9 C), temperature source Oral, resp. rate 20, height 5\' 3"  (1.6 m), weight 122 lb 5.7 oz (55.5 kg), SpO2 92 %. Temp:  [98.3 F (36.8 C)-98.5 F (36.9 C)] 98.5 F (36.9 C) (11/05 1507) Pulse Rate:  [100-114] 100 (11/05 1507) Resp:  [20] 20 (11/05 1507) BP: (113-138)/(64-72) 120/72 (11/05 1507) SpO2:  [70 %-97 %] 92 % (11/05 1624) Weight:  [122 lb 5.7 oz (55.5 kg)] 122 lb 5.7 oz (55.5 kg) (11/05 0542)  Physical Exam: General: Alert and awake, seems disoriented HEENT: anicteric sclera,, EOMI oP without exudate CVS tachy rate, normal r,  no murmur rubs or gallops Chest: no resp distress, prolonged exp phase Abdomen: soft  nontender, nondistended, normal bowel sounds, Extremities: no  clubbing or edema noted bilaterally Skin: no rashes Neuro: nonfocal  CBC:  CBC Latest Ref Rng & Units 12/18/2016 12/16/2016 12/15/2016  WBC 4.0 - 10.5 K/uL 15.4(H) 17.5(H) 18.3(H)  Hemoglobin 13.0 - 17.0 g/dL 7.9(L) 7.8(L) 8.1(L)  Hematocrit 39.0 - 52.0 % 24.6(L) 23.7(L) 24.6(L)  Platelets 150 - 400 K/uL 365 337 315     BMET Recent Labs    12/16/16 0358  NA 135  K 3.9  CL 98*  CO2 30  GLUCOSE 119*  BUN 12  CREATININE 0.63  CALCIUM 9.6     Liver Panel  No results for input(s): PROT, ALBUMIN, AST, ALT, ALKPHOS, BILITOT, BILIDIR, IBILI in the last 72 hours.     Sedimentation Rate No results for input(s): ESRSEDRATE in the last  72 hours. C-Reactive Protein No results for input(s): CRP in the last 72 hours.  Micro Results: Recent Results (from the past 720 hour(s))  Culture, blood (Routine x 2)     Status: Abnormal   Collection Time: 11/13/2016  6:37 PM  Result Value Ref Range Status   Specimen Description BLOOD RIGHT ANTECUBITAL  Final   Special Requests   Final    BOTTLES DRAWN AEROBIC AND ANAEROBIC Blood Culture adequate volume   Culture  Setup Time   Final    GRAM POSITIVE COCCI IN BOTH AEROBIC AND ANAEROBIC BOTTLES CRITICAL RESULT CALLED TO, READ BACK BY AND VERIFIED WITH: M. Chanetta Marshall.D. 10:55 12/05/16 ([email or web address]) Performed at [facility], [place] [address], [place], [place] [address]    Culture STAPHYLOCOCCUS AUREUS (A)  Final   Report Status 12/07/2016 FINAL  Final   Organism ID, Bacteria STAPHYLOCOCCUS AUREUS  Final      Susceptibility   Staphylococcus aureus - MIC*    CIPROFLOXACIN <=0.5 SENSITIVE Sensitive     ERYTHROMYCIN <=0.25 SENSITIVE Sensitive     GENTAMICIN <=0.5 SENSITIVE Sensitive     OXACILLIN 0.5 SENSITIVE Sensitive     TETRACYCLINE <=1 SENSITIVE Sensitive     VANCOMYCIN 1 SENSITIVE Sensitive     TRIMETH/SULFA <=10 SENSITIVE Sensitive     CLINDAMYCIN <=0.25 SENSITIVE Sensitive     RIFAMPIN <=0.5 SENSITIVE Sensitive     Inducible Clindamycin NEGATIVE Sensitive     * STAPHYLOCOCCUS AUREUS  Blood Culture ID Panel (Reflexed)     Status: Abnormal   Collection Time: 11/26/2016  6:37 PM  Result Value Ref Range Status   Enterococcus species NOT DETECTED NOT DETECTED Final   Listeria monocytogenes NOT DETECTED NOT DETECTED Final   Staphylococcus species DETECTED (A) NOT DETECTED Final    Comment: CRITICAL RESULT CALLED TO, READ BACK BY AND VERIFIED WITH: M. Chanetta Marshall.D. 10:55 12/05/16 ([email or web address])    Staphylococcus aureus DETECTED (A) NOT DETECTED Final    Comment: Methicillin (oxacillin) susceptible Staphylococcus aureus (MSSA). Preferred therapy is anti staphylococcal beta lactam  antibiotic (Cefazolin or Nafcillin), unless clinically contraindicated. CRITICAL RESULT CALLED TO, READ BACK BY AND VERIFIED WITH: M. Lear Ng Pharm.D. 10:55 12/05/16 ([email or web address])    Methicillin resistance NOT DETECTED NOT DETECTED Final   Streptococcus species NOT DETECTED NOT DETECTED Final   Streptococcus agalactiae NOT DETECTED NOT DETECTED Final   Streptococcus pneumoniae NOT DETECTED NOT DETECTED Final   Streptococcus pyogenes NOT DETECTED NOT DETECTED Final   Acinetobacter baumannii NOT DETECTED NOT DETECTED Final   Enterobacteriaceae species NOT DETECTED NOT DETECTED Final   Enterobacter cloacae complex NOT DETECTED NOT DETECTED Final   Escherichia coli NOT DETECTED NOT DETECTED Final  Klebsiella oxytoca NOT DETECTED NOT DETECTED Final   Klebsiella pneumoniae NOT DETECTED NOT DETECTED Final   Proteus species NOT DETECTED NOT DETECTED Final   Serratia marcescens NOT DETECTED NOT DETECTED Final   Haemophilus influenzae NOT DETECTED NOT DETECTED Final   Neisseria meningitidis NOT DETECTED NOT DETECTED Final   Pseudomonas aeruginosa NOT DETECTED NOT DETECTED Final   Candida albicans NOT DETECTED NOT DETECTED Final   Candida glabrata NOT DETECTED NOT DETECTED Final   Candida krusei NOT DETECTED NOT DETECTED Final   Candida parapsilosis NOT DETECTED NOT DETECTED Final   Candida tropicalis NOT DETECTED NOT DETECTED Final    Comment: Performed at [facility], [place] [address], [place], [place] [address]  Culture, blood (Routine x 2)     Status: Abnormal   Collection Time: 11/28/2016  6:40 PM  Result Value Ref Range Status   Specimen Description BLOOD RIGHT FOREARM  Final   Special Requests   Final    BOTTLES DRAWN AEROBIC AND ANAEROBIC Blood Culture adequate volume   Culture  Setup Time   Final    GRAM POSITIVE COCCI IN BOTH AEROBIC AND ANAEROBIC BOTTLES CRITICAL VALUE NOTED.  VALUE IS CONSISTENT WITH PREVIOUSLY REPORTED AND CALLED VALUE.    Culture (A)  Final    STAPHYLOCOCCUS  AUREUS SUSCEPTIBILITIES PERFORMED ON PREVIOUS CULTURE WITHIN THE LAST 5 DAYS. Performed at [facility], [place] [address] [place], [place] [address]    Report Status 12/07/2016 FINAL  Final  Culture, sputum-assessment     Status: None   Collection Time: 11/17/2016 11:40 PM  Result Value Ref Range Status   Specimen Description SPUTUM  Final   Special Requests Immunocompromised  Final   Sputum evaluation THIS SPECIMEN IS ACCEPTABLE FOR SPUTUM CULTURE  Final   Report Status 12/05/2016 FINAL  Final  Culture, respiratory (NON-Expectorated)     Status: None   Collection Time: 11/22/2016 11:40 PM  Result Value Ref Range Status   Specimen Description SPUTUM  Final   Special Requests Immunocompromised Reflexed from [id number]  Final   Gram Stain   Final    RARE WBC PRESENT,BOTH PMN AND MONONUCLEAR ABUNDANT SQUAMOUS EPITHELIAL CELLS PRESENT MODERATE GRAM POSITIVE COCCI MODERATE GRAM POSITIVE RODS MODERATE GRAM NEGATIVE RODS    Culture   Final    MODERATE Consistent with normal respiratory flora. Performed at [facility], [place] [address], [place], [place] [address]    Report Status 12/08/2016 FINAL  Final  Rapid strep screen     Status: None   Collection Time: 12/05/16  4:04 PM  Result Value Ref Range Status   Streptococcus, Group A Screen (Direct) NEGATIVE NEGATIVE Final    Comment: (NOTE) A Rapid Antigen test may result negative if the antigen level in the sample is below the detection level of this test. The FDA has not cleared this test as a stand-alone test therefore the rapid antigen negative result has reflexed to a Group A Strep culture.   Culture, group A strep     Status: None   Collection Time: 12/05/16  4:04 PM  Result Value Ref Range Status   Specimen Description THROAT  Final   Special Requests NONE Reflexed from [id number]  Final   Culture   Final    NO GROUP A STREP (S.PYOGENES) ISOLATED Performed at [facility], [address], [place], [place] [address]     Report Status 12/08/2016 FINAL  Final  MRSA PCR Screening     Status:  None   Collection Time: 12/05/16  8:20 PM  Result Value Ref Range Status   MRSA by PCR NEGATIVE NEGATIVE Final    Comment:        The GeneXpert MRSA Assay (FDA approved for NASAL specimens only), is one component of a comprehensive MRSA colonization surveillance program. It is not intended to diagnose MRSA infection nor to guide or monitor treatment for MRSA infections.   Culture, blood (Routine X 2) w Reflex to ID Panel     Status: None   Collection Time: 12/06/16  4:05 AM  Result Value Ref Range Status   Specimen Description BLOOD LEFT HAND  Final   Special Requests   Final    BOTTLES DRAWN AEROBIC AND ANAEROBIC Blood Culture adequate volume   Culture   Final    NO GROWTH 5 DAYS Performed at [facility], [address], [place], [place] [address]    Report Status 12/11/2016 FINAL  Final  Aerobic/Anaerobic Culture (surgical/deep wound)     Status: None   Collection Time: 12/06/16 12:41 PM  Result Value Ref Range Status   Specimen Description WOUND RIGHT CHEST  Final   Special Requests Normal  Final   Gram Stain   Final    ABUNDANT WBC PRESENT, PREDOMINANTLY PMN ABUNDANT GRAM POSITIVE COCCI IN PAIRS    Culture   Final    ABUNDANT STAPHYLOCOCCUS AUREUS NO ANAEROBES ISOLATED Performed at [facility], [place] [address], [place], [place] [address]    Report Status 12/11/2016 FINAL  Final   Organism ID, Bacteria STAPHYLOCOCCUS AUREUS  Final      Susceptibility   Staphylococcus aureus - MIC*    CIPROFLOXACIN <=0.5 SENSITIVE Sensitive     ERYTHROMYCIN <=0.25 SENSITIVE Sensitive     GENTAMICIN <=0.5 SENSITIVE Sensitive     OXACILLIN 0.5 SENSITIVE Sensitive     TETRACYCLINE <=1 SENSITIVE Sensitive     VANCOMYCIN <=0.5 SENSITIVE Sensitive     TRIMETH/SULFA <=10 SENSITIVE Sensitive     CLINDAMYCIN <=0.25 SENSITIVE Sensitive     RIFAMPIN <=0.5 SENSITIVE Sensitive     Inducible Clindamycin  NEGATIVE Sensitive     * ABUNDANT STAPHYLOCOCCUS AUREUS  Body fluid culture     Status: None   Collection Time: 12/07/16 10:31 AM  Result Value Ref Range Status   Specimen Description PLEURAL RIGHT  Final   Special Requests Immunocompromised  Final   Gram Stain   Final    ABUNDANT WBC PRESENT,BOTH PMN AND MONONUCLEAR NO ORGANISMS SEEN    Culture   Final    NO GROWTH 3 DAYS Performed at [facility], [place] [address] [place], [place] [address]    Report Status 12/10/2016 FINAL  Final  Culture, blood (Routine X 2) w Reflex to ID Panel     Status: None (Preliminary result)   Collection Time: 12/15/16  4:15 PM  Result Value Ref Range Status   Specimen Description BLOOD RIGHT ANTECUBITAL  Final   Special Requests IN PEDIATRIC BOTTLE Blood Culture adequate volume  Final   Culture   Final    NO GROWTH 3 DAYS Performed at [facility], [place] [address] [place], [place] [address]    Report Status PENDING  Incomplete  Culture, blood (Routine X 2) w Reflex to ID Panel     Status: None (Preliminary result)   Collection Time: 12/15/16  4:22 PM  Result Value Ref Range Status   Specimen Description BLOOD RIGHT HAND  Final  Special Requests IN PEDIATRIC BOTTLE Blood Culture adequate volume  Final   Culture   Final    NO GROWTH 3 DAYS Performed at [facility], [place] [address], [place], [place] [address]    Report Status PENDING  Incomplete  Culture, body fluid-bottle     Status: None (Preliminary result)   Collection Time: 12/16/16  1:00 PM  Result Value Ref Range Status   Specimen Description PLEURAL LEFT  Final   Special Requests NONE  Final   Culture   Final    NO GROWTH 2 DAYS Performed at [facility], [place] [address], [place], [place] [address]    Report Status PENDING  Incomplete  Gram stain     Status: None   Collection Time: 12/16/16  1:00 PM  Result Value Ref Range Status   Specimen Description PLEURAL LEFT  Final   Special Requests NONE  Final   Gram  Stain   Final    FEW WBC PRESENT,BOTH PMN AND MONONUCLEAR NO ORGANISMS SEEN Performed at [facility], [address], [place], [place] [address]    Report Status 12/16/2016 FINAL  Final  Body fluid culture     Status: None (Preliminary result)   Collection Time: 12/17/16  9:00 AM  Result Value Ref Range Status   Specimen Description PLEURAL RIGHT  Final   Special Requests NONE  Final   Gram Stain NO WBC SEEN NO ORGANISMS SEEN   Final   Culture   Final    NO GROWTH < 24 HOURS Performed at [facility], [place] [address], [place], [place] [address]    Report Status PENDING  Incomplete    Studies/Results: Dg Chest Port 1 View  Result Date: 12/17/2016 CLINICAL DATA:  Status post thoracentesis EXAM: PORTABLE CHEST 1 VIEW COMPARISON:  12/16/2016 FINDINGS: Stable left PICC. Stable right pleural effusion. No evidence of pneumothorax. Increasing airspace disease at the left lung base. Left pleural effusion is increased and is small. IMPRESSION: No pneumothorax post right thoracentesis. Increasing left effusion and basilar airspace disease. Electronically Signed   By: Marybelle Killings M.D.   On: 12/17/2016 11:35   US Thoracentesis Asp Pleural Space W/img Guide  Result Date: 12/17/2016 INDICATION: Right pleural effusion EXAM: ULTRASOUND GUIDED RIGHT THORACENTESIS MEDICATIONS: None. COMPLICATIONS: None immediate. PROCEDURE: An ultrasound guided thoracentesis was thoroughly discussed with the patient and questions answered. The benefits, risks, alternatives and complications were also discussed. The patient understands and wishes to proceed with the procedure. Written consent was obtained. Ultrasound was performed to localize and mark an adequate pocket of fluid in the right chest. A loculated right pleural effusion was noted. The area was then prepped and draped in the normal sterile fashion. 1% Lidocaine was used for local anesthesia. Under ultrasound guidance a Safe-T-Centesis catheter was  introduced. Thoracentesis was performed. The catheter was removed and a dressing applied. FINDINGS: A total of approximately 60 cc of clear yellow fluid was removed. Samples were sent to the laboratory as requested by the clinical team. IMPRESSION: Successful ultrasound guided right thoracentesis yielding 60 cc of pleural fluid. Electronically Signed   By: Marybelle Killings M.D.   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 male with  Metastatic cancer on palliative chemotherapy with MSSA bacteremia due to infected porta-cath then with FUO sp thoracocentesis for pleural effusions that had grown now afebrile  #1 MSSA bacteremia, porta-cath infection  --finish 4 weeks of IV abx as originally planned  #2 Parapneumonic effusions: actually exudative but not frankly purulent. No clear cut need for CT surgical intervention  I am OK with adding metronidazole for anerobic coverage in pleural space and can give orally if he can take po  #3 FUO: resolved after thoracocentesis. Atelectasis could have also played a role with pleural effusions being in place   IV abx plan is as follows  Diagnosis: Staphylococcus Aureus bacteremia due to porta-cath  Culture Result: MSSA  No Known Allergies  OPAT Orders Discharge antibiotics: IV  cefazolin  Duration:  4 weeks  End Date:  01/02/17  [facility] Care Per Protocol:  Labs weekly while on IV antibiotics: _x_ CBC with differential x__ BMP    _x_ Please pull PIC at completion of IV antibiotics __ Please leave PIC in place until doctor has seen patient or been notified  Fax weekly labs to [phone]  Clinic Follow Up Appt:   Please call [phone] if you would like for [place] to see the patient in followup.        LOS: 14 days   Alcide Evener 12/18/2016, 6:52 PM

## 2016-12-18 NOTE — Progress Notes (Signed)
CSW following for assistance with disposition. Followed up with pt and his sister re: SNF bed offers. Sister at pt's bedside states "our brother is out [facility] right now." Other bed offer is [facility]- sister states "we had family there before and weren't that happy." CSW encouraged sister/pt to make facility choice as DC is anticipated. Sister states, "We won't have him go just anywhere, can he not just stay here till we work it out?" CSW explained when pt stable medically for DC, pt will need to choose from SNFs available. Sister states, "And if not?" CSW explained if pt refuses SNFs available CSW will not be able to assist with facility placement at that point and pt would need to make arrangements to return home or to the residence of his choosing. Sister states she will call CSW back as soon as brother finished with SNF tour.  Sharren Bridge, MSW, LCSW Clinical Social Work 12/18/2016 Coverage for [phone]

## 2016-12-18 NOTE — Patient Outreach (Signed)
[facility]) Care Management  12/18/2016  Antonio [name] [birth year] [id number]   CSW was scheduled to contact patient today (Monday, December 18, 2016) at 8:30AM to perform the initial phone assessment, as well as assess and assist with social work needs and services; however, CSW noted that patient remains hospitalized.  CSW will continue to follow patient while he is in the hospital and then provide community case management services to patient once he is discharged. Nat Christen, BSW, MSW, LCSW  Licensed Education officer, [facility]  [address] [place] [address], [place], [place] [address] Physical Address-[address], [place], [place] [address] Toll Free Main # [phone] Fax # [phone] Cell # [phone]  Office # [phone] [email or web address]

## 2016-12-18 NOTE — Progress Notes (Signed)
PROGRESS NOTE  Antonio [name]  GLO:[id number] DOB: Dec 24, 1955 DOA: 12/01/2016 PCP: Maryellen Pile, MD  Outpatient Specialists: Oncology, [name]  Brief Narrative:  Antonio [name] is a 61 y.o. male with a history of supraglottic laryngeal CA s/p resection, chemo/radiation currently on palliative chemotherapy who was brought from home by EMS due to weakness, a fall, and confusion which was reported by his sister. He reports he slipped and fell on urine and denies any confusion. He reported chills and a productive cough to admitting MD. He denied fevers but had a temperature of 103.63F, heart rates 134-141, respirations 18-32, blood pressures 117/57-122/66, O2 saturations 91-96% on room air. Labs revealed WBC 3.7, hemoglobin 8.2, MCV 76.8, RDW 16.4, platelets 184, BUN 23, creatinine 1, albumin 2.5, lactic acid trending upwards to 1.92. Chest x-ray showed right base collapse with small pleural effusion. Urinalysis was negative for any signs of infection. He was given 3L of normal saline, empiric antibiotics and admitted. Blood cultures became positive for MSSA, antibiotics narrowed to ancef per ID recommendations. Pt became hypotensive and was transferred to the stepdown unit with CCM consult, though vitals have stabilized with fluids alone. Port was removed with purulent pocket noted on 10/24 and thoracentesis was performed 10/25 fluid consistent with exudate, pulmonary has seen and signed off.   Continue IV antibiotics as indicated by ID. Will place PICC line and will concretize discharge plan.  Subjective:   Afebrile overnight, denies chest pain, no nausea, no vomiting, breathing is stable.  No abdominal pain.  Assessment & Plan:  Sepsis due to MSSA bacteremia due to port infection  - in a immunocompromised patient, port which was infected removed on 12/06/2016, he was negative for hepatitis or HIV.   -ID following and on board.  Currently on Ancef since 12/05/2016.  Negative transthoracic  echogram for any vegetations, TEE cannot be done due to laryngeal cancer.   -plan is for 1 more days of IV ancef (plan is for a total of 28 days) -repeat blood cx's so far remains no growth -continue supportive care. -Given continued fevers and further elevation of his WBCs, ct chest repeat and demonstrated bilateral pleural effusion, with loculation on the right -Patient has had thoracentesis of the left and the right side with some improvement in his air movement and overall respiratory status.  Fluid analysis showed to be an exudate but without frank purulence or growth at this moment.   -Following ID recommendations we will add Flagyl to his regimen to protect for anaerobes microorganisms in the lung field.  -Appreciate ID assistance and recommendations -chest [place] has r/o abscess.  RLL opacity on imaging, along with right-sided pleural effusion.  CT chest done and thoracentesis obtained by pulmonary on 12/07/2016,could have compressive atelectasis versus pneumonia as per CT findings, fluid appears to be consistent with exudate. -continue current abx's. -patient without any further fevers now over 36 hours and WBCs trending down. -Status post left and right thoracentesis; fluid analysis demonstrated to the exudate -As mentioned above we will add Flagyl for anaerobes coverage -continue oxygen supplementation and wean as tolerated.  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 [name] oncology and palliative care following, for now wishes to pursue full code and palliative chemo if needed.   -If declines may transition to palliative care/hospice but not now.  Persistent tachycardia.  In no pain or distress, has been hydrated, stable TSH and echogram, He is on intermittent tube feeds via PEG tube and in between he  is n.p.o.  -continue IVF's -HR improving overall; but still up -continue IV hydration.  Dysphagia -s/p PEG May 2018.    -Continue feeds thru PEG tube.  -Ok to give small amount of liquid po for pt comfort after oral care  Pancytopenia  -Suspect due to marrow suppression.   -Required 1 unit of packed RBC transfusion on 12/05/2016 -stable H&H since then.  -continue to monitor CBC intermittently  Fall with generalized weakness  -due to weakness and deconditioning,  -continue supportive care -patient will like to go home; but with poor social support. And will require continue IV antibiotic therapy for another 15 days. -patient in agreement to go to SNF at discharge. SW on board and aware   History of hypercalcemia of malignancy  -resolved for now after IVF's given -Follow electrolytes trend intermittently -continue free water six times a day through PEG  Peripheral neuropathy  -Continue Neurontin  Essential hypertension.  -Continue metoprolol 75 mg twice daily.   -BP stable -will monitor on telemetry  DVT prophylaxis: SCDs Code Status: Full Family Communication: No family at bedside today. Disposition Plan: Patient feeling a lot better.  Thoracentesis fluid analysis demonstrating exudate (but no clear purulence and so far no growth).  Has remained afebrile and his WBCs are trending down.  After discussing with infectious disease doctor the plan is to add Flagyl for potential coverage of anaerobes in his lung feels and continue treatment with Ancef for another 15 days.    Consultants:    Oncology, [name].  ID, Dr. Johnnye Sima  IR, Dr. Anselm Pancoast  CCM, Dr. Ashok Cordia   Palliative care team  Procedures:    Echocardiogram 12/06/2016:   Left ventricle: The cavity size was normal. Wall thickness was normal. Systolic function was normal. The estimated ejection  fraction was in the range of 55% to 60%. Wall motion was normal;  there were no regional wall motion abnormalities. Doppler  parameters are consistent with abnormal left ventricular  relaxation (grade 1 diastolic dysfunction).  Mitral valve:  Calcified annulus.  Impressions: - Normal LV systolic function; mild diastolic dysfunction.   Antimicrobials:   Vancomycin 10/22 > 10/23  Zosyn 10/22  Cefepime 10/22 > 10/23   Ancef 10/23 >>   Objective:  Vitals:   12/18/16 0542 12/18/16 1507 12/18/16 1607 12/18/16 1624  BP: 113/64 120/72    Pulse: (!) 109 100    Resp: 20 20    Temp: 98.4 F (36.9 C) 98.5 F (36.9 C)    TempSrc: Oral Oral    SpO2: 93% 95% (!) 70% 92%  Weight: 55.5 kg (122 lb 5.7 oz)     Height:        Intake/Output Summary (Last 24 hours) at 12/18/2016 1926 Last data filed at 12/18/2016 1911 Gross per 24 hour  Intake 1372.5 ml  Output 775 ml  Net 597.5 ml   Filed Weights   12/16/16 0601 12/17/16 0543 12/18/16 0542  Weight: 57.4 kg (126 lb 8.7 oz) 54.3 kg (119 lb 11.4 oz) 55.5 kg (122 lb 5.7 oz)    Exam General: Patient has remained afebrile, denies chest pain, still using oxygen supplementation (2 L).  No nausea, no vomiting, no abdominal pain.  Patient is alert awake and oriented x3; and orbital is feeling much better.   Resp: Improved air movement bilaterally, no wheezing, no crackles, scattered rhonchi and appreciated on bilateral lung fields.  Normal respiratory effort and no using accessory muscles.   Heart: Sinus tachycardia, no rubs, no gallops, no JVD.  Abd: Soft, nontender, nondistended, positive bowel sounds, PEG tube in place.   Skin: Skin exam unchanged from 11/4: positive dressing on his right chest from recent removed port-a-cath; no significant drainage or erythema appreciated. Patient with PEG tube in place, site remains clean; no drainage appreciated. Extremities: No edema, no cyanosis, no clubbing  Data Reviewed: I have personally reviewed following labs and imaging studies  CBC: Recent Labs  Lab 12/13/16 0430 12/15/16 0449 12/16/16 0358 12/18/16 0321  WBC 17.0* 18.3* 17.5* 15.4*  HGB 8.5* 8.1* 7.8* 7.9*  HCT 26.4* 24.6* 23.7* 24.6*  MCV 81.5 80.4 80.3 81.5  PLT 335 315  337 720   Basic Metabolic Panel: Recent Labs  Lab 12/13/16 0430 12/15/16 0449 12/16/16 0358  NA 136 134* 135  K 4.1 4.0 3.9  CL 93* 94* 98*  CO2 32 31 30  GLUCOSE 93 102* 119*  BUN 11 13 12   CREATININE 0.55* 0.55* 0.63  CALCIUM 9.5 9.6 9.6   GFR: Estimated Creatinine Clearance: 77.1 mL/min (by C-G formula based on SCr of 0.63 mg/dL).   Liver Function Tests: Recent Labs  Lab 12/13/16 0430  AST 58*  ALT 40  ALKPHOS 141*  BILITOT 0.7  PROT 6.0*  ALBUMIN 1.8*   CBG: Recent Labs  Lab 12/17/16 1716 12/17/16 2143 12/18/16 0741 12/18/16 1116 12/18/16 1815  GLUCAP 90 169* 90 119* 173*   Urine analysis:    Component Value Date/Time   COLORURINE YELLOW 11/29/2016 1847   APPEARANCEUR CLEAR 11/23/2016 1847   LABSPEC 1.013 12/09/2016 1847   PHURINE 5.0 11/28/2016 1847   GLUCOSEU NEGATIVE 12/13/2016 1847   HGBUR NEGATIVE 11/27/2016 1847   BILIRUBINUR NEGATIVE 11/23/2016 1847   KETONESUR NEGATIVE 11/30/2016 1847   PROTEINUR NEGATIVE 11/18/2016 1847   NITRITE NEGATIVE 11/17/2016 1847   LEUKOCYTESUR NEGATIVE 11/26/2016 1847   Recent Results (from the past 240 hour(s))  Culture, blood (Routine X 2) w Reflex to ID Panel     Status: None (Preliminary result)   Collection Time: 12/15/16  4:15 PM  Result Value Ref Range Status   Specimen Description BLOOD RIGHT ANTECUBITAL  Final   Special Requests IN PEDIATRIC BOTTLE Blood Culture adequate volume  Final   Culture   Final    NO GROWTH 3 DAYS Performed at [facility], [place] [address], [place], [place] [address]    Report Status PENDING  Incomplete  Culture, blood (Routine X 2) w Reflex to ID Panel     Status: None (Preliminary result)   Collection Time: 12/15/16  4:22 PM  Result Value Ref Range Status   Specimen Description BLOOD RIGHT HAND  Final   Special Requests IN PEDIATRIC BOTTLE Blood Culture adequate volume  Final   Culture   Final    NO GROWTH 3 DAYS Performed at [facility], [place] [address], [place], [place] [address]    Report Status PENDING  Incomplete  Culture, body fluid-bottle     Status: None (Preliminary result)   Collection Time: 12/16/16  1:00 PM  Result Value Ref Range Status   Specimen Description PLEURAL LEFT  Final   Special Requests NONE  Final   Culture   Final    NO GROWTH 2 DAYS Performed at [facility], [place] [address], [place], [place] [address]    Report Status PENDING  Incomplete  Gram stain     Status: None   Collection Time: 12/16/16  1:00 PM  Result Value Ref Range Status  Specimen Description PLEURAL LEFT  Final   Special Requests NONE  Final   Gram Stain   Final    FEW WBC PRESENT,BOTH PMN AND MONONUCLEAR NO ORGANISMS SEEN Performed at [facility], [address], [place], [place] [address]    Report Status 12/16/2016 FINAL  Final  Body fluid culture     Status: None (Preliminary result)   Collection Time: 12/17/16  9:00 AM  Result Value Ref Range Status   Specimen Description PLEURAL RIGHT  Final   Special Requests NONE  Final   Gram Stain NO WBC SEEN NO ORGANISMS SEEN   Final   Culture   Final    NO GROWTH < 24 HOURS Performed at [facility], [place] [address] [place], [place] [address]    Report Status PENDING  Incomplete      Radiology Studies: Dg Chest Port 1 View  Result Date: 12/17/2016 CLINICAL DATA:  Status post thoracentesis EXAM: PORTABLE CHEST 1 VIEW COMPARISON:  12/16/2016 FINDINGS: Stable left PICC. Stable right pleural effusion. No evidence of pneumothorax. Increasing airspace disease at the left lung base. Left pleural effusion is increased and is small. IMPRESSION: No pneumothorax post right thoracentesis. Increasing left effusion and basilar airspace disease. Electronically Signed   By: Marybelle Killings M.D.   On: 12/17/2016 11:35   US Thoracentesis Asp Pleural Space W/img Guide  Result Date: 12/17/2016 INDICATION: Right pleural effusion EXAM: ULTRASOUND GUIDED RIGHT THORACENTESIS MEDICATIONS: None.  COMPLICATIONS: None immediate. PROCEDURE: An ultrasound guided thoracentesis was thoroughly discussed with the patient and questions answered. The benefits, risks, alternatives and complications were also discussed. The patient understands and wishes to proceed with the procedure. Written consent was obtained. Ultrasound was performed to localize and mark an adequate pocket of fluid in the right chest. A loculated right pleural effusion was noted. The area was then prepped and draped in the normal sterile fashion. 1% Lidocaine was used for local anesthesia. Under ultrasound guidance a Safe-T-Centesis catheter was introduced. Thoracentesis was performed. The catheter was removed and a dressing applied. FINDINGS: A total of approximately 60 cc of clear yellow fluid was removed. Samples were sent to the laboratory as requested by the clinical team. IMPRESSION: Successful ultrasound guided right thoracentesis yielding 60 cc of pleural fluid. Electronically Signed   By: Marybelle Killings M.D.   On: 12/17/2016 11:36    Scheduled Meds: . chlorhexidine  15 mL Mouth Rinse BID  . enoxaparin (LOVENOX) injection  40 mg Subcutaneous Q24H  . feeding supplement (OSMOLITE 1.5 CAL)  474 mL Per Tube TID WC  . free water  125 mL Per Tube Q4H  . gabapentin  300 mg Per Tube TID  . ipratropium-albuterol  3 mL Nebulization BID  . mouth rinse  15 mL Mouth Rinse q12n4p  . metoCLOPramide  5 mg Per Tube TID AC  . metoprolol tartrate  75 mg Oral BID  . pantoprazole sodium  40 mg Per Tube BID  . senna-docusate  1 tablet Oral BID   Continuous Infusions: . sodium chloride 75 mL/hr at 12/18/16 0105  .  ceFAZolin (ANCEF) IV Stopped (12/18/16 1830)  . metronidazole Stopped (12/18/16 1830)     LOS: 14 days   Signature  Barton Dubois M.D  [phone]

## 2016-12-19 ENCOUNTER — Ambulatory Visit: Payer: Self-pay

## 2016-12-19 ENCOUNTER — Ambulatory Visit: Payer: Self-pay | Admitting: Hematology and Oncology

## 2016-12-19 ENCOUNTER — Inpatient Hospital Stay (HOSPITAL_COMMUNITY): Payer: Medicare Other

## 2016-12-19 ENCOUNTER — Inpatient Hospital Stay (HOSPITAL_COMMUNITY)
Admit: 2016-12-19 | Discharge: 2016-12-19 | Disposition: A | Discharge status: Home / Self Care | Payer: Medicare Other | Attending: Critical Care Medicine | Admitting: Critical Care Medicine

## 2016-12-19 ENCOUNTER — Other Ambulatory Visit: Payer: Self-pay

## 2016-12-19 DIAGNOSIS — S062X9D Diffuse traumatic brain injury with loss of consciousness of unspecified duration, subsequent encounter: Secondary | ICD-10-CM

## 2016-12-19 DIAGNOSIS — T451X5A Adverse effect of antineoplastic and immunosuppressive drugs, initial encounter: Secondary | ICD-10-CM

## 2016-12-19 DIAGNOSIS — I469 Cardiac arrest, cause unspecified: Secondary | ICD-10-CM

## 2016-12-19 DIAGNOSIS — I5030 Unspecified diastolic (congestive) heart failure: Secondary | ICD-10-CM

## 2016-12-19 DIAGNOSIS — C7951 Secondary malignant neoplasm of bone: Secondary | ICD-10-CM

## 2016-12-19 DIAGNOSIS — C782 Secondary malignant neoplasm of pleura: Secondary | ICD-10-CM

## 2016-12-19 DIAGNOSIS — J9601 Acute respiratory failure with hypoxia: Secondary | ICD-10-CM

## 2016-12-19 DIAGNOSIS — R1319 Other dysphagia: Secondary | ICD-10-CM

## 2016-12-19 DIAGNOSIS — G931 Anoxic brain damage, not elsewhere classified: Secondary | ICD-10-CM

## 2016-12-19 LAB — URINALYSIS, ROUTINE W REFLEX MICROSCOPIC
Bilirubin Urine: NEGATIVE
Glucose, UA: 50 mg/dL — AB
Hgb urine dipstick: NEGATIVE
Ketones, ur: NEGATIVE mg/dL
NITRITE: NEGATIVE
PH: 5 (ref 5.0–8.0)
Protein, ur: 30 mg/dL — AB
Specific Gravity, Urine: 1.012 (ref 1.005–1.030)

## 2016-12-19 LAB — CBC WITH DIFFERENTIAL/PLATELET
BASOS PCT: 0 %
Basophils Absolute: 0 10*3/uL (ref 0.0–0.1)
Eosinophils Absolute: 0 10*3/uL (ref 0.0–0.7)
Eosinophils Relative: 0 %
HCT: 19.7 % — ABNORMAL LOW (ref 39.0–52.0)
HEMOGLOBIN: 6.7 g/dL — AB (ref 13.0–17.0)
LYMPHS ABS: 0.7 10*3/uL (ref 0.7–4.0)
Lymphocytes Relative: 4 %
MCH: 27.7 pg (ref 26.0–34.0)
MCHC: 34 g/dL (ref 30.0–36.0)
MCV: 81.4 fL (ref 78.0–100.0)
Monocytes Absolute: 0.8 10*3/uL (ref 0.1–1.0)
Monocytes Relative: 4 %
NEUTROS ABS: 17.3 10*3/uL — AB (ref 1.7–7.7)
NEUTROS PCT: 92 %
Platelets: 252 10*3/uL (ref 150–400)
RBC: 2.42 MIL/uL — AB (ref 4.22–5.81)
RDW: 16.9 % — ABNORMAL HIGH (ref 11.5–15.5)
WBC: 18.8 10*3/uL — ABNORMAL HIGH (ref 4.0–10.5)

## 2016-12-19 LAB — PROTIME-INR
INR: 1.25
Prothrombin Time: 15.6 seconds — ABNORMAL HIGH (ref 11.4–15.2)

## 2016-12-19 LAB — BLOOD GAS, ARTERIAL
Acid-Base Excess: 2.7 mmol/L — ABNORMAL HIGH (ref 0.0–2.0)
Bicarbonate: 28.5 mmol/L — ABNORMAL HIGH (ref 20.0–28.0)
DRAWN BY: 308601
FIO2: 80
MECHVT: 460 mL
O2 Saturation: 97.7 %
PATIENT TEMPERATURE: 37
PEEP: 8 cmH2O
PO2 ART: 113 mmHg — AB (ref 83.0–108.0)
RATE: 16 resp/min
pCO2 arterial: 55.8 mmHg — ABNORMAL HIGH (ref 32.0–48.0)
pH, Arterial: 7.329 — ABNORMAL LOW (ref 7.350–7.450)

## 2016-12-19 LAB — COMPREHENSIVE METABOLIC PANEL
ALBUMIN: 1.6 g/dL — AB (ref 3.5–5.0)
ALT: 25 U/L (ref 17–63)
AST: 39 U/L (ref 15–41)
Alkaline Phosphatase: 106 U/L (ref 38–126)
Anion gap: 7 (ref 5–15)
BUN: 13 mg/dL (ref 6–20)
CALCIUM: 8.5 mg/dL — AB (ref 8.9–10.3)
CHLORIDE: 101 mmol/L (ref 101–111)
CO2: 28 mmol/L (ref 22–32)
CREATININE: 0.59 mg/dL — AB (ref 0.61–1.24)
GFR calc Af Amer: >60 mL/min (ref >60)
GFR calc non Af Amer: >60 mL/min (ref >60)
GLUCOSE: 133 mg/dL — AB (ref 65–99)
Potassium: 3.5 mmol/L (ref 3.5–5.1)
SODIUM: 136 mmol/L (ref 135–145)
Total Bilirubin: 0.6 mg/dL (ref 0.3–1.2)
Total Protein: 4.9 g/dL — ABNORMAL LOW (ref 6.5–8.1)

## 2016-12-19 LAB — TROPONIN I
TROPONIN I: 0.04 ng/mL — AB (ref <0.03)
TROPONIN I: 0.04 ng/mL — AB (ref <0.03)

## 2016-12-19 LAB — GLUCOSE, CAPILLARY
GLUCOSE-CAPILLARY: 143 mg/dL — AB (ref 65–99)
GLUCOSE-CAPILLARY: 136 mg/dL — AB (ref 65–99)
Glucose-Capillary: 100 mg/dL — ABNORMAL HIGH (ref 65–99)
Glucose-Capillary: 112 mg/dL — ABNORMAL HIGH (ref 65–99)

## 2016-12-19 LAB — PROCALCITONIN: Procalcitonin: 0.38 ng/mL

## 2016-12-19 LAB — MAGNESIUM: MAGNESIUM: 1.7 mg/dL (ref 1.7–2.4)

## 2016-12-19 LAB — PREPARE RBC (CROSSMATCH)

## 2016-12-19 LAB — CORTISOL: Cortisol, Plasma: 42.5 ug/dL

## 2016-12-19 LAB — LACTIC ACID, PLASMA
LACTIC ACID, VENOUS: 2.2 mmol/L — AB (ref 0.5–1.9)
LACTIC ACID, VENOUS: 2.2 mmol/L — AB (ref 0.5–1.9)

## 2016-12-19 LAB — PHOSPHORUS: Phosphorus: 3.3 mg/dL (ref 2.5–4.6)

## 2016-12-19 MED ORDER — PIPERACILLIN-TAZOBACTAM 3.375 G IVPB 30 MIN
3.3750 g | Freq: Once | INTRAVENOUS | Status: AC
  Administered 2016-12-19: 3.375 g via INTRAVENOUS
  Filled 2016-12-19 (×2): qty 50

## 2016-12-19 MED ORDER — LORAZEPAM 2 MG/ML IJ SOLN
2.0000 mg | Freq: Once | INTRAMUSCULAR | Status: AC
  Administered 2016-12-19: 2 mg via INTRAVENOUS
  Filled 2016-12-19: qty 1

## 2016-12-19 MED ORDER — MIDAZOLAM HCL 2 MG/2ML IJ SOLN: 2.0000 mg | INTRAMUSCULAR | Status: DC | PRN

## 2016-12-19 MED ORDER — VANCOMYCIN HCL IN DEXTROSE 750-5 MG/150ML-% IV SOLN
750.0000 mg | Freq: Two times a day (BID) | INTRAVENOUS | Status: DC
  Filled 2016-12-19: qty 150

## 2016-12-19 MED ORDER — CHLORHEXIDINE GLUCONATE 0.12% ORAL RINSE (MEDLINE KIT): 15.0000 mL | Freq: Two times a day (BID) | OROMUCOSAL | Status: DC

## 2016-12-19 MED ORDER — PIPERACILLIN-TAZOBACTAM 3.375 G IVPB
3.3750 g | Freq: Three times a day (TID) | INTRAVENOUS | Status: DC
  Administered 2016-12-19: 3.375 g via INTRAVENOUS

## 2016-12-19 MED ORDER — LORAZEPAM 2 MG/ML IJ SOLN
5.0000 mg/h | INTRAVENOUS | Status: AC
  Administered 2016-12-19 – 2016-12-21 (×4): 5 mg/h via INTRAVENOUS
  Filled 2016-12-19 (×5): qty 25

## 2016-12-19 MED ORDER — ORAL CARE MOUTH RINSE
15.0000 mL | Freq: Four times a day (QID) | OROMUCOSAL | Status: DC
  Administered 2016-12-19 – 2016-12-22 (×14): 15 mL via OROMUCOSAL

## 2016-12-19 MED ORDER — SODIUM CHLORIDE 0.9 % IV BOLUS (SEPSIS): 500.0000 mL | Freq: Once | INTRAVENOUS | Status: AC

## 2016-12-19 MED ORDER — PHENYLEPHRINE HCL-NACL 10-0.9 MG/250ML-% IV SOLN
0.0000 ug/min | INTRAVENOUS | Status: DC
  Administered 2016-12-19: 40 ug/min via INTRAVENOUS
  Administered 2016-12-19: 140 ug/min via INTRAVENOUS
  Administered 2016-12-19: 40 ug/min via INTRAVENOUS
  Administered 2016-12-19: 80 ug/min via INTRAVENOUS
  Administered 2016-12-19: 60 ug/min via INTRAVENOUS
  Administered 2016-12-19 – 2016-12-20 (×5): 40 ug/min via INTRAVENOUS
  Administered 2016-12-21: 10 ug/min via INTRAVENOUS
  Administered 2016-12-21: 40 ug/min via INTRAVENOUS
  Filled 2016-12-19 (×12): qty 250

## 2016-12-19 MED ORDER — LORAZEPAM 2 MG/ML IJ SOLN
2.0000 mg | Freq: Once | INTRAMUSCULAR | Status: AC
  Administered 2016-12-19: 2 mg via INTRAVENOUS

## 2016-12-19 MED ORDER — SENNOSIDES-DOCUSATE SODIUM 8.6-50 MG PO TABS
1.0000 | ORAL_TABLET | Freq: Two times a day (BID) | ORAL | Status: DC
  Administered 2016-12-19 – 2016-12-22 (×5): 1
  Filled 2016-12-19 (×5): qty 1

## 2016-12-19 MED ORDER — CHLORHEXIDINE GLUCONATE 0.12% ORAL RINSE (MEDLINE KIT)
15.0000 mL | Freq: Two times a day (BID) | OROMUCOSAL | Status: DC
  Administered 2016-12-19 – 2016-12-22 (×7): 15 mL via OROMUCOSAL

## 2016-12-19 MED ORDER — VANCOMYCIN HCL IN DEXTROSE 1-5 GM/200ML-% IV SOLN
1000.0000 mg | Freq: Once | INTRAVENOUS | Status: AC
  Administered 2016-12-19: 1000 mg via INTRAVENOUS
  Filled 2016-12-19: qty 200

## 2016-12-19 MED ORDER — PHENYLEPHRINE HCL-NACL 10-0.9 MG/250ML-% IV SOLN
INTRAVENOUS | Status: AC
  Administered 2016-12-19: 40 mg
  Filled 2016-12-19: qty 250

## 2016-12-19 MED ORDER — DOCUSATE SODIUM 50 MG/5ML PO LIQD: 100.0000 mg | Freq: Two times a day (BID) | ORAL | Status: DC | PRN

## 2016-12-19 MED ORDER — SODIUM CHLORIDE 0.9 % IV SOLN: Freq: Once | INTRAVENOUS | Status: AC

## 2016-12-19 MED ORDER — SODIUM CHLORIDE 0.9 % IV BOLUS (SEPSIS)
1000.0000 mL | Freq: Once | INTRAVENOUS | Status: AC
  Administered 2016-12-19: 1000 mL via INTRAVENOUS

## 2016-12-19 MED ORDER — CEFAZOLIN SODIUM-DEXTROSE 1-4 GM/50ML-% IV SOLN
1.0000 g | Freq: Three times a day (TID) | INTRAVENOUS | Status: DC
  Administered 2016-12-19 – 2016-12-20 (×2): 1 g via INTRAVENOUS
  Filled 2016-12-19 (×3): qty 50

## 2016-12-19 MED ORDER — SODIUM CHLORIDE 0.9 % IV BOLUS (SEPSIS): 250.0000 mL | Freq: Once | INTRAVENOUS | Status: AC

## 2016-12-19 MED ORDER — LORAZEPAM BOLUS VIA INFUSION
2.0000 mg | INTRAVENOUS | Status: DC | PRN
  Administered 2016-12-20: 2 mg via INTRAVENOUS
  Filled 2016-12-19: qty 5

## 2016-12-19 MED ORDER — LORAZEPAM 2 MG/ML IJ SOLN
INTRAMUSCULAR | Status: AC
  Administered 2016-12-19: 11:00:00
  Filled 2016-12-19: qty 1

## 2016-12-19 MED ORDER — POTASSIUM CHLORIDE 10 MEQ/100ML IV SOLN
10.0000 meq | INTRAVENOUS | Status: AC
  Administered 2016-12-19 – 2016-12-20 (×4): 10 meq via INTRAVENOUS
  Filled 2016-12-19 (×3): qty 100

## 2016-12-19 MED ORDER — CHLORHEXIDINE GLUCONATE 0.12 % MT SOLN
OROMUCOSAL | Status: AC
  Filled 2016-12-19: qty 15

## 2016-12-19 MED ORDER — ORAL CARE MOUTH RINSE
15.0000 mL | OROMUCOSAL | Status: DC
  Administered 2016-12-19: 15 mL via OROMUCOSAL

## 2016-12-19 MED ORDER — FENTANYL CITRATE (PF) 100 MCG/2ML IJ SOLN
100.0000 ug | INTRAMUSCULAR | Status: DC | PRN
  Administered 2016-12-19: 100 ug via INTRAVENOUS

## 2016-12-19 MED ORDER — DEXTROSE 5 % IV SOLN
5.0000 ug/min | INTRAVENOUS | Status: DC
  Filled 2016-12-19 (×2): qty 4

## 2016-12-19 MED ORDER — FAMOTIDINE IN NACL 20-0.9 MG/50ML-% IV SOLN
20.0000 mg | Freq: Two times a day (BID) | INTRAVENOUS | Status: DC
  Administered 2016-12-19 – 2016-12-22 (×7): 20 mg via INTRAVENOUS
  Filled 2016-12-19 (×7): qty 50

## 2016-12-19 MED ORDER — FENTANYL CITRATE (PF) 100 MCG/2ML IJ SOLN
100.0000 ug | INTRAMUSCULAR | Status: DC | PRN
  Filled 2016-12-19: qty 2

## 2016-12-19 MED ORDER — LORAZEPAM 2 MG/ML IJ SOLN
INTRAMUSCULAR | Status: AC
  Administered 2016-12-19: 15:00:00
  Filled 2016-12-19: qty 1

## 2016-12-19 MED ORDER — MAGNESIUM SULFATE 2 GM/50ML IV SOLN
2.0000 g | Freq: Once | INTRAVENOUS | Status: AC
Start: 2016-12-19 — End: 2016-12-19
  Administered 2016-12-19: 2 g via INTRAVENOUS
  Filled 2016-12-19: qty 50

## 2016-12-19 MED FILL — Medication: Qty: 1 | Status: AC

## 2016-12-19 NOTE — Progress Notes (Signed)
Patient repositioned to right side with myoclonic movement noted. Spoke with Noe Gens NP. Order received. Awaiting EEG results and then she will speak with family.

## 2016-12-19 NOTE — Progress Notes (Signed)
Several family members have now arrived and are all at bedside. Updated all family regarding events of tonight and patient's condition. Since labwork has now returned, I am confident that his cardiac arrest was due to worsening sepsis and hypoxia. His neurological exam is still very poor with no cough, no gag now, pupils unreactive, not breathing over the vent. Given this do not think he is a good candidate for therapeutic hypothermia, which is a relative contraindication anyway in setting of sepsis.   Family discussed goals of care at length. They all agreed he should be DNR, however they are not in agreement whether he would want to continue on mechanical ventilation vs withdraw care.. One sister said he would not want mechanical ventilation, but other sister said he told her and also told the Oncologist that he WOULD want intubation. The family is continuing to discuss this.  On my exam patient is not breathing over the vent at all. He is having decerebrate posturing. I am concerned he may be progressing towards brain death. Will plan for EEG in AM and consider apnea test. Family is agreeable to this.

## 2016-12-19 NOTE — Progress Notes (Signed)
Repositioned better on left side after 2 D echo completed. Patient now with myoclonic movements again. RR increased to 30. Call placed to Noe Gens NP with order received.

## 2016-12-19 NOTE — Progress Notes (Signed)
*  PRELIMINARY RESULTS* Echocardiogram 2D Echocardiogram has been performed.  Antonio [name] 12/19/2016, 2:09 PM

## 2016-12-19 NOTE — Procedures (Signed)
Arterial Catheter Insertion Procedure Note Antonio [name] [id number] 11/26/1955  Procedure: Insertion of Arterial Catheter  Indications: Blood pressure monitoring  Procedure Details Consent: Unable to obtain consent because of emergent medical necessity. Time Out: Verified patient identification, verified procedure, site/side was marked, verified correct patient position, special equipment/implants available, medications/allergies/relevent history reviewed, required imaging and test results available.  Performed  Maximum sterile technique was used including antiseptics, cap, gloves, gown and hand hygiene. Skin prep: Chlorhexidine; local anesthetic administered 20 gauge catheter was inserted into right radial artery using the Seldinger technique.  Evaluation Blood flow good; BP tracing good. Complications: No apparent complications.   Rosann Auerbach 12/19/2016

## 2016-12-19 NOTE — Progress Notes (Signed)
   12/19/16 0105  What Happened  Was fall witnessed? No  Patient found on floor  Found by Staff-comment  Stated prior activity other (comment) (UTA - pt unconscious)  Follow Up  MD notified Baltazar Najjar  Time MD notified [id number]  Family notified Yes-comment  Time family notified ([facility] - ICU notified family after transfer)  Additional tests No  Progress note created (see row info) Yes  Adult Fall Risk Assessment  Risk Factor Category (scoring not indicated) History of more than one fall within 6 months before admission (document High fall risk)  Age 61  Fall History: Fall within 6 months prior to admission 0  Elimination; Bowel and/or Urine Incontinence 0  Elimination; Bowel and/or Urine Urgency/Frequency 0  Medications: includes PCA/Opiates, Anti-convulsants, Anti-hypertensives, Diuretics, Hypnotics, Laxatives, Sedatives, and Psychotropics 5  Patient Care Equipment 2  Mobility-Assistance 2  Mobility-Gait 2  Mobility-Sensory Deficit 0  Altered awareness of immediate physical environment 1  Impulsiveness 2  Lack of understanding of one's physical/cognitive limitations 0  Total Score 15  Patient's Fall Risk High Fall Risk (>13 points)  Adult Fall Risk Interventions  Required Bundle Interventions *See Row Information* High fall risk - low, moderate, and high requirements implemented  Additional Interventions Use of appropriate toileting equipment (bedpan, BSC, etc.)  Screening for Fall Injury Risk  Risk For Fall Injury- See Row Information  None identified  Vitals  Temp (Vitals obtained during code)  Neurological  Neuro (WDL) X  Level of Consciousness Unresponsive  Orientation Level Other (comment) (unresponsive - UTA)  Cognition Other (Comment) (unresponsive - UTA)  Speech Other (Comment) (unresponsive - UTA)  Musculoskeletal  Musculoskeletal (WDL) X (unresponsive)  Integumentary  Integumentary (WDL) WDL  Skin Condition Dry;Flaky  Skin Integrity Intact

## 2016-12-19 NOTE — Progress Notes (Signed)
Name: Antonio [name] MRN: [id number] DOB: Nov 26, 1955    ADMISSION DATE:  11/16/2016 CONSULTATION DATE:  12/05/2016  REFERRING MD :  Dr. 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  He has undergone radiation therapy, palliative chemotherapy.  Disease has progressed as of CT scan 11/13/16.  Admitted with severe sepsis and found to have an MSSA bacteremia.  His Port-A-Cath was identified as source and removed on 12/06/16.  Possible associated pneumonia, right greater than left pleural effusion. Status post thoracentesis on 10/25, then repeat bilateral thoracenteses on 11/4 (1.3L off on left, 60cc off on right), consistent with an exudate.  Pleural fluid culture negative with no malignant cells identified. He is being treated with continued ancef. Course complicated by persistent FUO, and pleural effusion. Repeat thora with no growth so far. Flagyl added for potential anaerobe coverage in pleural space.   11/6 early AM the patient suffered what is thought to be a respiratory > asystolic cardiac arrest. The floor nurse said she last checked on him at 12am at which time she was giving him his scheduled free water. She said he was asymptomatic at that time. At approx 1:10am, the patient's bed alarm went off and another nurse ran into the room to check on patient. He was found on the floor at that time but had a pulse and was moving. They helped get him back into bed. Shortly after getting him back into bed he became bradycardic then lost a pulse, with rhythm showing asystole (at time 1:15am). CPR initiated and ROSC achieved at 1:24am. Patient intubated by anesthesia at 1:30am then transferred to the ICU. At the time of my exam he is in shock with BP 60's/40's. Patient unresponsive and is having decerebrate posturing.   SUBJECTIVE/interval:  Cardiac arrest overnight. Thought to  be primary respiratory arrest. Difficult intubation by anesthesia.   VITAL SIGNS: Temp:  [98.4 F (36.9 C)-99.1 F (37.3 C)] 99.1 F (37.3 C) (11/05 2049) Pulse Rate:  [100-112] 112 (11/05 2049) Resp:  [20-22] 22 (11/05 2049) BP: (113-140)/(64-72) 140/71 (11/05 2049) SpO2:  [70 %-95 %] 90 % (11/05 2103) Weight:  [55.5 kg (122 lb 5.7 oz)] 55.5 kg (122 lb 5.7 oz) (11/05 0542)   PHYSICAL EXAMINATION: General: elderly chronically ill appearing male, intubated and unresponsive, critically ill HEENT: orally intubated; copious oral secretions on suctioning, scant ETT secretions Neuro: decerebrate posturing; pupils pinpoint and nonresponsive; no cough; minimal gag. No response to sternal rub CV: RRR, no m/r/g Pulmonary: Coarse breath sounds b/l Abdomen: soft, mildly distended, PEG tube in place, absent bowel sounds Musculoskeletal: right upper chest incision from where port had been removed; scant purulent appearing fluid present within incision. No LE edema.  Skin: no rashes    Recent Labs  Lab 12/13/16 0430 12/15/16 0449 12/16/16 0358  NA 136 134* 135  K 4.1 4.0 3.9  CL 93* 94* 98*  CO2 32 31 30  BUN 11 13 12   CREATININE 0.55* 0.55* 0.63  GLUCOSE 93 102* 119*   Recent Labs  Lab 12/15/16 0449 12/16/16 0358 12/18/16 0321  HGB 8.1* 7.8* 7.9*  HCT 24.6* 23.7* 24.6*  WBC 18.3* 17.5* 15.4*  PLT 315 337 365   Dg Chest Port 1 View  Result Date: 12/17/2016 CLINICAL DATA:  Status post thoracentesis EXAM: PORTABLE CHEST 1 VIEW COMPARISON:  12/16/2016 FINDINGS: Stable left PICC. Stable right pleural effusion. No  evidence of pneumothorax. Increasing airspace disease at the left lung base. Left pleural effusion is increased and is small. IMPRESSION: No pneumothorax post right thoracentesis. Increasing left effusion and basilar airspace disease. Electronically Signed   By: Marybelle Killings M.D.   On: 12/17/2016 11:35   US Thoracentesis Asp Pleural Space W/img Guide  Result Date:  12/17/2016 INDICATION: Right pleural effusion EXAM: ULTRASOUND GUIDED RIGHT THORACENTESIS MEDICATIONS: None. COMPLICATIONS: None immediate. PROCEDURE: An ultrasound guided thoracentesis was thoroughly discussed with the patient and questions answered. The benefits, risks, alternatives and complications were also discussed. The patient understands and wishes to proceed with the procedure. Written consent was obtained. Ultrasound was performed to localize and mark an adequate pocket of fluid in the right chest. A loculated right pleural effusion was noted. The area was then prepped and draped in the normal sterile fashion. 1% Lidocaine was used for local anesthesia. Under ultrasound guidance a Safe-T-Centesis catheter was introduced. Thoracentesis was performed. The catheter was removed and a dressing applied. FINDINGS: A total of approximately 60 cc of clear yellow fluid was removed. Samples were sent to the laboratory as requested by the clinical team. IMPRESSION: Successful ultrasound guided right thoracentesis yielding 60 cc of pleural fluid. Electronically Signed   By: Marybelle Killings M.D.   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 Hypermetabolic right lower lobe pulmonary nodule is favored to represent a synchronous primary bronchogenic neoplasm. Isolated pulmonary metastasis could look similar. No thoracic nodal hypermetabolism. Soft tissue hypermetabolism about the right iliac bone and greater trochanter of the proximal right femur are favored to be degenerative or posttraumatic. Soft tissue metastasis felt highly unlikely. Coronary artery atherosclerosis. Aortic atherosclerosis. A tiny right lower lobe pulmonary nodule is most likely a subpleural lymph node.  CT CHEST W/ CONTRAST 10/1: Interim development of multiple enlarged  mediastinal and hilar lymph nodes consistent with metastatic disease. Development of multiple bilateral pulmonary nodules, also suspicious for metastatic disease. Suspect that the previously noted right lower lobe lung mass has increased in size, now measuring about 26 mm compared with 19 mm previously. New small right-sided pleural effusion. Patchy consolidations in the right lower lobe, uncertain if this represents pneumonia, aspiration, or metastatic nodules. Subcentimeter hypodense foci in the liver, too small to further characterize. When the patient is clinically stable and able to follow directions and hold their breath (preferably as an outpatient) further evaluation with dedicated abdominal MRI may be considered.  PORT CXR 10/24:  Personally reviewed by me. Persistent right basilar opacification with silhouetting of hemidiaphragm & fullness to right hilum. Streaky opacification with some suggestion of air bronchogram and consolidation within the left lower lung as well. Unable to appreciate pleural effusion meniscus. Right-sided port in place.  Echo 10/30 > LVEF 65-70%, trivial pericardial effusion. No vegetation noted.   CT chest 11/2: Large right pleural effusion again noted but now appears loculated. Compressive atelectasis of the right lower lung. Enlarging moderate to large left pleural effusion with left lower lung atelectasis. 2 adjacent 2.7 cm hypodense lesions within the hepatic dome, unchanged from 12/06/2016 but new since 11/13/2016. Although this could represent rapidly progressive metastatic disease, infection is not excluded given patient history. Pulmonary, mediastinal/hilar lymphatic and left eleventh rib metastases as described. T9 vertebral body lesion, new since 11/13/2016. This probably represents rapid development of a metastasis as this would be an  unusual appearance of infection.  MICROBIOLOGY:  Blood Cultures x2 10/22 >> MSSA Sputum Culture 10/22 >>> consistent with normal  respiratory flora Group A Strep Culture 10/23 >>> negative MRSA PCR 10/23:  Negative Blood Culture x1 10/24 >>> R Chest Wound 10/24 >> abundant staph >> pansensitive R Pleural Fluid 10/25 >> WBC 1,234, 97% neutrophil, LD 388, total protein <3.   R Pleural Culture 10/25 >> no growth R Pleural Cytology 10/25 >> acute reactive and mesothelial cells.  No malignant cells identified Blood Cx 11/2 >>> Pleural fluid L 11/3 >>> Pleural fluid R 11/4 >>>  ANTIBIOTICS: Zosyn 10/22 (x1 dose) Vancomycin 10/22 (x1 dose) Cefepime 10/23 (x2 doses) Ancef 10/23 >>> Flagyl 11/5 >>>  SIGNIFICANT EVENTS  10/22 - Admitted 10/23 - Transferred to SDU w/ repeat episode hypotension 10/25 - Thoracentesis, R side with 1.5 L bloody/cloudy fluid removed   ASSESSMENT / PLAN:  61 y.o. male with stage IV metastatic squamous cell carcinoma. Previous imaging indicated evolving metastatic disease of the lungs. Patient admitted with sepsis secondary to MSSA bacteremia.  On ancef for 28 day course. Hospital course complicated by ongoing fevers and pleural effusions. Now complicated by asystolic cardiac arrest 84/1.  PULMONARY 1. Acute hypoxemic respiratory failure; HCAP pneumonia; Bilateral pleual effusions:  - s/p thora x3. Exudative. So far culture negative and path negative.  - continue mechanical ventilation; obtained ABG and made vent adjustments. Repeat ABG daily - CXR on my review shows ETT in good position; no change in RLL infiltrate; slightly increased size of right-sided loculated effusion; small left-sided effusion; pulmonary edema - VAP bundle.  - follow-up pleural fluid culture and path - obtain sputum culture.  - antibiotics discussed below  CARDIOVASCULAR 1. Cardiac arrest - asystole: - unclear etiology; anesthesia believed it to be a primarily respiratory event. Short down-time of 9 minutes, but despite that now has neurologic exam concerning for anoxic encephalopathy - if wish to pursue  therapeutic hypothermia, will need to transfer to [facility]. At this time however patient is too hemodynamically unstable for transfer. Also question given the extremely poor neurologic exam if cooling would be of benefit. Also if labs are consistent with overwhelming sepsis, then that would be a relative contraindication for cooling.  - patient's next of kin is 4 sisters. Already 1 sister and 1 niece have arrived and state that patient had told them just yesterday that he was "tired" of everything; they think he wouldn't want any of this aggressive care. The other 3 sisters are on their way to the hospital now. Will address goals of care again when they arrive.  - order TTE and EEG  2. Hx HTN: - hold antihypertensives in setting of shock  RENAL No active issues   GASTROINTESTINAL 1. Dysphagia s/p PEG; GERD - NPO for now - PEG to low intermittent suction - GI prophylaxis  HEMATOLOGIC/ONCOLOGIC 1. Stage IV squamous cell carcinoma of head neck. Widely metastatic. Palliative chemo currently.  - need to address goals of care with family  INFECTIOUS 1. Shock: presumed septic; MSSA bacteremia secondary to port-a-cath infection; HCAP pneumonia; Persistent fevers: - s/p port removal. TTE with no evidence of vegetations. 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  - will pan-culture (blood, urine, sputum) now; trend lactate and procalcitonin - follow-up pleural fluid  cultures  - give 30cc/kg IVF bolus and start Levophed gtt - broaden antibiotics to Vanc, Zosyn, and continue Flagyl - EKG shows NSR with no ST changes; will check troponin  ENDOCRINE No active issues  NEUROLOGIC 1. 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    FAMILY  - Updated patient's sister and niece at bedside. 3 other sisters are on their way to the hospital currently.  - Inter-disciplinary family meet or Palliative Care meeting due by: 12/25/16   60 minutes critical care time  Vernie Murders, MD Pulmonary & Critical Care Medicine Pager: [phone]

## 2016-12-19 NOTE — Progress Notes (Signed)
NUTRITION NOTE  Pt seen for initial assessment by this RD on 11/2. Code Blue was initiated this AM around 1:15 AM and pt was transferred to the ICU, intubated. Per rounds this AM, pt may be a one-way extubation later today. RD will continue to follow/monitor for plan.    Jarome Matin, MS, RD, LDN, [facility] Inpatient Clinical Dietitian Pager # [phone] After hours/weekend pager # [phone]

## 2016-12-19 NOTE — Procedures (Signed)
ELECTROENCEPHALOGRAM REPORT  Date of Study: 12/19/2016  Patient's Name: Antonio [name] MRN: [id number] Date of Birth: 06-May-1955  Referring Provider: Dr. Vernie Murders  Clinical History: This is a 61 year old man s/p cardiac arrest  Medications: acetaminophen (TYLENOL) tablet 650 mg  famotidine (PEPCID) IVPB 20 mg premix  free water 125 mL  ipratropium-albuterol (DUONEB) 0.5-2.5 (3) MG/3ML nebulizer solution 3 mL  lidocaine (XYLOCAINE) 1 % (with pres) injection  lidocaine-prilocaine (EMLA) cream  metoCLOPramide (REGLAN) 10 MG/10ML solution 5 mg  metroNIDAZOLE (FLAGYL) IVPB 500 mg  norepinephrine (LEVOPHED) 4 mg in dextrose 5 % 250 mL (0.016 mg/mL) infusion  phenylephrine (NEOSYNEPHRINE) 10-0.9 MG/250ML-% infusion  piperacillin-tazobactam (ZOSYN) IVPB 3.375 g  vancomycin (VANCOCIN) IVPB 750 mg/150 ml premix   Technical Summary: A multichannel digital EEG recording measured by the international 10-20 system with electrodes applied with paste and impedances below 5000 ohms performed as portable with EKG monitoring in an intubated and unresponsive patient.  Hyperventilation and photic stimulation were not performed.  The digital EEG was referentially recorded, reformatted, and digitally filtered in a variety of bipolar and referential montages for optimal display.   Description: The patient is intubated and unresponsive during the recording. No sedating medication listed. There is no clear posterior dominant rhythm seen. The background consists of a large amount of diffuse low voltage 4-5 Hz theta and 2-3 Hz delta slowing. Normal sleep architecture is not seen. Hyperventilation and photic stimulation were not performed. With stimulation, there is an increase in muscle artifact and eye blinks, with head tremor and upper body jerks, right shoulder jerks without epileptiform correlate seen. There were no epileptiform discharges or electrographic seizures seen.    EKG lead showed sinus  tachycardia.  Impression: This EEG is abnormal due to moderate diffuse low voltage background slowing.  Clinical Correlation of the above findings indicates diffuse cerebral dysfunction that is non-specific in etiology and can be seen with hypoxic/ischemic injury, toxic/metabolic encephalopathies, or medication effect. Body jerks noted were not associated with epileptiform correlate. There were no electrographic seizures in this study. Clinical correlation is advised.   Ellouise Newer, M.D.

## 2016-12-19 NOTE — Progress Notes (Signed)
Pt coded without NP's knowledge. Code team responded. Pt transferred to ICU. PCCM took over care.  KJKG, NP [facility]

## 2016-12-19 NOTE — Care Management Note (Signed)
Case Management Note  Patient Details  Name: Burk Hoctor MRN: [id number] Date of Birth: 12-22-1955  Subjective/Objective:                  S/p code blue status and on vent Action/Plan: Date: December 19, 2016 Creola Corn  [phone] Chart and notes review for patient progress and needs. Will follow for case management and discharge needs. Next review date: [id number]  Expected Discharge Date:  (unknown)               Expected Discharge Plan:  [place]  In-House Referral:     Discharge planning Services  CM Consult  Post Acute Care Choice:  Resumption of Svcs/PTA Provider Choice offered to:     DME Arranged:    DME Agency:     HH Arranged:    [place] Agency:     Status of Service:  In process, will continue to follow  If discussed at Long Length of Stay Meetings, dates discussed:    Additional Comments:  Leeroy Cha, RN 12/19/2016, 8:35 AM

## 2016-12-19 NOTE — Progress Notes (Signed)
   12/19/16 0600  Clinical Encounter Type  Visited With Patient and family together;Health care provider  Visit Type Initial;Psychological support;Spiritual support;Social support;Code;Critical Care  Referral From Nurse  Consult/Referral To Chaplain  Spiritual Encounters  Spiritual Needs Prayer;Emotional  Stress Factors  Patient Stress Factors None identified  Family Stress Factors Loss of control     Name: Antonio [name] (MRN: [id number]) Location: [place] [facility]  Time: 4:00am  Petra Kuba of Call: Pastoral Care and Code [employer] was called at 4:00am to attend to a code blue-several family members at bedside. Hammonds, Sharyn Blitz, MD was at bedside when Chaplain arrived. Chaplain had conversation with family about their hopes and fears relating to Pt's plan of care. Family seems to be opened to the idea of hospice which was introduce by that DR. It seems as though family is doing a great job of supporting one another. Chaplain assessment is that family is actively grieving. During visit family told stories about the "patient's will to live"; this brought smiles to the room even in the midst of many reds eyes and hands holding collections of tissues. Chaplain learned that there's no church home or community-due to this family seemed appreciative of the spiritual support. Chaplain asked for prayer request and prayed with family.   Family members are indecisive about taking Pt off the breathing machine; however they seem to all come to an agreement that making the Pt DNR maybe the best thing to do at this point. Family reports that Pt has been fighting the battle with cancer for a while now; family says "we think he's drained from fighting." Family prayed that God's will be done and strength to make upcoming decisions.   Chaplain left family alone to spent time with Pt. Family definitely finds comfort in their faith and in each other.   Dante Gang,  Chaplain

## 2016-12-19 NOTE — Progress Notes (Signed)
EEG completed, results pending. 

## 2016-12-19 NOTE — Consult Note (Signed)
   [facility] CM Inpatient Consult   12/19/2016  Antonio [name] 05-09-1955 [id number]    [facility] Care Management follow up.  [name] has been active with [facility] Care Management. Writer has been following for disposition.   Chart reviewed. Noted Antonio [name] is now in ICU on vent.   Will continue to follow along and update [place] team.    Marthenia Rolling, MSN-Ed, RN,BSN [facility] Liaison [phone]

## 2016-12-19 NOTE — Progress Notes (Signed)
Pharmacy Antibiotic Note  Antonio [name] is a 61 y.o. male admitted on 12/06/2016 with sepsis.  Pharmacy has been consulted for Vancomycin and Zosyn dosing.  Plan: Zosyn 3.375g IV q8h (4 hour infusion).  Vancomycin 750mg  iv q12hr Goal AUC = 400 - 500 for all indications, except meningitis (goal AUC > 500 and Cmin 15-20 mcg/mL)   Height: 5\' 3"  (160 cm) Weight: 122 lb 5.7 oz (55.5 kg) IBW/kg (Calculated) : 56.9  Temp (24hrs), Avg:98.7 F (37.1 C), Min:98.4 F (36.9 C), Max:99.1 F (37.3 C)  Recent Labs  Lab 12/13/16 0430 12/15/16 0449 12/16/16 0358 12/18/16 0321  WBC 17.0* 18.3* 17.5* 15.4*  CREATININE 0.55* 0.55* 0.63  --     Estimated Creatinine Clearance: 77.1 mL/min (by C-G formula based on SCr of 0.63 mg/dL).    No Known Allergies  Antimicrobials this admission: 0/22 Zosyn x 1 11/6 >> 10/22 Vanc >>10/23 11/6 >> 10/23 Cefepime >>10/23 10/23 Ancef>> 11/6 11/5 flagyl>> 11/6    Dose adjustments this admission: -  Microbiology results: 10/22 BCx: s. Aureus. (susc pending) BCID 3/4 MSSA 10/22 Urine strep: neg 10/22 Urine legionella: 10/22 Influenza panel:neg 10/22 Strep Pneumo Ua neg 10/22 MRSA PCR: neg 10/23 rapid strep: neg 10/23 strep cx: neg 10/22 Sputum: sq cells, GNR, GPcocci, GPR 10/24 BCx: NGF 10/24 "wound" (PAC removal site): abundant MSSA 10/25 Pleural: NGF 11/2 bcx x2: 11/3 R Pleural fluid:  11/4 Pleural fluid:     Thank you for allowing pharmacy to be a part of this patient's care.  Nani Skillern Crowford 12/19/2016 2:51 AM

## 2016-12-19 NOTE — Progress Notes (Addendum)
CODE BLUE note:  Patient found on the floor in his room-apneic and pulseless.  Put back on the bed and CPR started.  Initial rhythm showed asystole and he was given a dose of epinephrine.  I arrived with CPR in progress.  Patient had been admitted for sepsis secondary to pneumonia, history of supraglottic cancer.  Anesthesia was present and attempted intubation, but difficult intubation requiring a second attempt with glottiscope.  He did regain pulses following 1 dose of epinephrine.  He started to get bradycardic again and was given a second dose of epinephrine.  Once he was intubated, he maintained adequate heart rate.  I suspect that the arrest was respiratory event.  He is transferred to the ICU on ventilator.  Critical care is consulted for management at this point.  Chest x-ray and ECG have been ordered as well as metabolic panel and troponin.  Cardiopulmonary Resuscitation (CPR) Procedure Note Directed/Performed by: Delora Fuel I personally directed ancillary staff and/or performed CPR in an effort to regain return of spontaneous circulation and to maintain cardiac, neuro and systemic perfusion.   ECG shows sinus tachycardia, otherwise normal.  I have discussed the case with Dr. Janann Colonel of critical care, who has assumed care of the patient.

## 2016-12-19 NOTE — Progress Notes (Signed)
Pt found on the floor. Fall protocol initiated. Pt unresponsive and without pulse when transferred back to bed. Code Blue initiated, compressions started at [place]. Transferred to ICU/SD.

## 2016-12-19 NOTE — Progress Notes (Signed)
PULMONARY / CRITICAL CARE MEDICINE   Name: Antonio [name] MRN: [id number] DOB: 08-28-1955    ADMISSION DATE:  12/09/2016 CONSULTATION DATE:  12/19/16  REFERRING MD:  Dr. 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 He has undergone radiation therapy, palliative chemotherapy. Disease has progressed as of CT scan 11/13/16. Admitted with severe sepsis and found to have an MSSA bacteremia. His Port-A-Cath was identified as source and removed on 12/06/16. Possible associated pneumonia, right greater than left pleural effusion. Status post thoracentesis on 10/25, then repeat bilateral thoracenteses on 11/4 (1.3L off on left, 60cc off on right), consistent with an exudate. Pleural fluid culture negative with no malignant cells identified. He is being treated with continued ancef. Course complicated by persistent FUO, and pleural effusion. Repeat thora with no growth so far. Flagyl added for potential anaerobe coverage in pleural space.   11/6 early AM the patient suffered what is thought to be a respiratory > asystolic cardiac arrest. The floor nurse said she last checked on him at 12am at which time she was giving him his scheduled free water. She said he was asymptomatic at that time. At approx 1:10am, the patient's bed alarm went off and another nurse ran into the room to check on patient. He was found on the floor at that time but had a pulse and was moving. They helped get him back into bed. Shortly after getting him back into bed he became bradycardic then lost a pulse, with rhythm showing asystole (at time 1:15am). CPR initiated and ROSC achieved at 1:24am. Patient intubated by anesthesia at 1:30am then transferred to the ICU. At the time of my exam he is in shock with BP 60's/40's. Patient unresponsive and is having decerebrate posturing.    SUBJECTIVE:  RN reports pt is having intermittent seizure like activity.  Multiple family members at bedside.    VITAL SIGNS: BP (!) 150/61   Pulse 93   Temp 98.6 F (37 C) (Oral)   Resp 16   Ht 5\' 3"  (1.6 m)   Wt 132 lb 7.9 oz (60.1 kg)   SpO2 100%   BMI 23.47 kg/m   HEMODYNAMICS:    VENTILATOR SETTINGS: Vent Mode: PRVC FiO2 (%):  [60 %-100 %] 60 % Set Rate:  [16 bmp-18 bmp] 16 bmp Vt Set:  [460 mL] 460 mL PEEP:  [5 cmH20-8 cmH20] 5 cmH20 Plateau Pressure:  [20 cmH20-26 cmH20] 26 cmH20  INTAKE / OUTPUT: I/O last 3 completed shifts: In: 2466.5 [I.V.:1042.5; NG/GT:1224; IV Piggyback:200] Out: 1500 [Urine:1500]  PHYSICAL EXAMINATION: General: chronically ill appearing male with myoclonic jerking on vent  HEENT: MM pink/moist, ETT Neuro: eyes deviated to upward left, no response to verbal commands, myoclonic jerking, + cough with suction CV: s1s2 rrr, no m/r/g, chest wall port incision with scant drainage at site PULM: even/non-labored, lungs bilaterally coarse  VQ:MGQQ, non-tender, bsx4 active  Extremities: warm/dry, no edema  Skin: no rashes or lesions  LABS:  BMET Recent Labs  Lab 12/15/16 0449 12/16/16 0358 12/19/16 0238  NA 134* 135 136  K 4.0 3.9 3.5  CL 94* 98* 101  CO2 31 30 28   BUN 13 12 13   CREATININE 0.55* 0.63 0.59*  GLUCOSE 102* 119* 133*    Electrolytes Recent Labs  Lab 12/15/16 0449 12/16/16 0358 12/19/16 0238  CALCIUM 9.6 9.6 8.5*  MG  --   --  1.7  PHOS  --   --  3.3    CBC Recent Labs  Lab 12/16/16 0358 12/18/16 0321 12/19/16 0238  WBC 17.5* 15.4* 18.8*  HGB 7.8* 7.9* 6.7*  HCT 23.7* 24.6* 19.7*  PLT 337 365 252    Coag's Recent Labs  Lab 12/19/16 0238  INR 1.25    Sepsis Markers Recent Labs  Lab 12/19/16 0238 12/19/16 0305 12/19/16 0615  LATICACIDVEN  --  2.2* 2.2*  PROCALCITON 0.38  --   --     ABG Recent Labs  Lab 12/19/16 0255  PHART 7.329*  PCO2ART 55.8*  PO2ART 113*    Liver  Enzymes Recent Labs  Lab 12/13/16 0430 12/19/16 0238  AST 58* 39  ALT 40 25  ALKPHOS 141* 106  BILITOT 0.7 0.6  ALBUMIN 1.8* 1.6*    Cardiac Enzymes Recent Labs  Lab 12/19/16 0238 12/19/16 0615  TROPONINI 0.04* 0.04*    Glucose Recent Labs  Lab 12/18/16 1116 12/18/16 1815 12/18/16 2045 12/19/16 0125 12/19/16 0316 12/19/16 0815  GLUCAP 119* 173* 189* 143* 136* 100*    Imaging Portable Chest Xray  Result Date: 12/19/2016 CLINICAL DATA:  61 year old male with respiratory failure. Subsequent encounter. EXAM: PORTABLE CHEST 1 VIEW COMPARISON:  12/19/2016 20:20 a.m. FINDINGS: Endotracheal tube tip 3.4 cm above the carina. Left PICC line tip distal superior vena cava level. Moderately large possibly loculated right-sided pleural effusion. Small left-sided pleural effusion. Basal atelectasis greater on right. Asymmetric airspace disease may represent pulmonary edema. No pneumothorax. Heart appears slightly enlarged. Right shoulder joint degenerative changes. Calcified aorta. IMPRESSION: Right greater left pleural effusions. Asymmetric airspace disease may represent pulmonary edema. Basilar atelectasis greater on the right. Limited for evaluating for underlying infiltrate or mass. Aortic Atherosclerosis (ICD10-I70.0). Electronically Signed   By: Genia Del M.D.   On: 12/19/2016 10:21   Dg Chest Port 1 View  Result Date: 12/19/2016 CLINICAL DATA:  Post intubation EXAM: PORTABLE CHEST 1 VIEW COMPARISON:  12/17/2016, 12/16/2016 FINDINGS: Endotracheal tube tip is about 3.9 cm superior to the carina. Left upper extremity catheter tip overlies the cavoatrial junction. Slight increased right-sided pleural effusion which appears loculated laterally. Small left pleural effusion. Cardiomegaly with central vascular congestion and mild diffuse interstitial edema. No pneumothorax. Aortic atherosclerosis. IMPRESSION: 1. Endotracheal tube tip about 3.9 cm superior to carina 2. Slight increased  loculated appearing right pleural effusion. Small left pleural effusion 3. Cardiomegaly with vascular congestion and mild pulmonary edema. 4. No change in right basilar consolidation. Electronically Signed   By: Donavan Foil M.D.   On: 12/19/2016 02:34     CERVICAL LYMPH NODE BIOPSY 05/18/16:  Squamous cell carcinoma   PET CT 06/08/16: Supraglottic laryngeal primary with left greater than right cervical and left supraclavicular nodal metastasis. Hypermetabolic right lower lobe pulmonary nodule is favored to represent a synchronous primary bronchogenic neoplasm. Isolated pulmonary metastasis could look similar. No thoracic nodal hypermetabolism. Soft tissue hypermetabolism about the right iliac bone and greater trochanter of the proximal right femur are favored to be degenerative or posttraumatic. Soft tissue metastasis felt highly unlikely. Coronary artery atherosclerosis. Aortic atherosclerosis. A tiny right lower lobe pulmonary nodule is most likely a subpleural lymph node.  CT CHEST W/ CONTRAST 10/1: Interim development of multiple enlarged mediastinal and hilar lymph nodes consistent with metastatic disease. Development of multiple bilateral pulmonary nodules, also suspicious for metastatic disease. Suspect that the previously noted right lower lobe lung mass has increased in size, now measuring about 26 mm compared with 19  mm previously. New small right-sided pleural effusion. Patchy consolidations in the right lower lobe, uncertain if this represents pneumonia, aspiration, or metastatic nodules. Subcentimeter hypodense foci in the liver, too small to further characterize. When the patient is clinically stable and able to follow directions and hold their breath (preferably as an outpatient) further evaluation with dedicated abdominal MRI may be considered.  PORT CXR 10/24:  Personally reviewed by me. Persistent right basilar opacification with silhouetting of hemidiaphragm & fullness to right hilum.  Streaky opacification with some suggestion of air bronchogram and consolidation within the left lower lung as well. Unable to appreciate pleural effusion meniscus. Right-sided port in place.  Echo 10/30 > LVEF 65-70%, trivial pericardial effusion. No vegetation noted.   CT chest 11/2: Large right pleural effusion again noted but now appears loculated. Compressive atelectasis of the right lower lung. Enlarging moderate to large left pleural effusion with left lower lung atelectasis. 2 adjacent 2.7 cm hypodense lesions within the hepatic dome, unchanged from 12/06/2016 but new since 11/13/2016. Although this could represent rapidly progressive metastatic disease, infection is not excluded given patient history. Pulmonary, mediastinal/hilar lymphatic and left eleventh rib metastases as described. T9 vertebral body lesion, new since 11/13/2016. This probably represents rapid development of a metastasis as this would be an unusual appearance of infection.  ECHO 11/6 >>   EEG 11/6 >> diffuse cerebral dysfunction that is non-specific in etiology. Body jerks noted are not associated with epileptiform correlate, no electrographic seizures in this study.   MICROBIOLOGY:  Blood Cultures x2 10/22 >> MSSA Sputum Culture 10/22 >>> consistent with normal respiratory flora Group A Strep Culture 10/23 >>> negative MRSA PCR 10/23:  Negative Blood Culture x1 10/24 >>> R Chest Wound 10/24 >> abundant staph >> pansensitive R Pleural Fluid 10/25 >> WBC 1,234, 97% neutrophil, LD 388, total protein <3.   R Pleural Culture 10/25 >> no growth R Pleural Cytology 10/25 >> acute reactive and mesothelial cells.  No malignant cells identified Blood Cx 11/2 >>> Pleural fluid L 11/3 >>> Pleural fluid R 11/4 >>>  ANTIBIOTICS: Zosyn 10/22 (x1 dose) Vancomycin 10/22 (x1 dose) Cefepime 10/23 (x2 doses) Ancef 10/23 >>> 11/5 Flagyl 11/5 >>> Vanco 11/5 >> Zosyn 11/5 >>  SIGNIFICANT EVENTS  10/22 - Admitted 10/23 -  Transferred to SDU w/ repeat episode hypotension 10/25 - Thoracentesis, R side with 1.5 L bloody/cloudy fluid removed   DISCUSSION: 61 y.o. male with stage IV metastatic squamous cell carcinoma. Previous imaging indicated evolving metastatic disease of the lungs. Patient admitted with sepsis secondary to MSSA bacteremia.  On ancef for 28 day course. Hospital course complicated by ongoing fevers and pleural effusions. Now complicated by asystolic cardiac arrest 16/3.  ASSESSMENT / PLAN:  PULMONARY A: Acute hypoxemic respiratory failure HCAP  Bilateral Pleural Effusion - s/p thora x3, exudative P:   Continue PRVC support  Neuro exam is poor > will follow up conversation once family arrives regarding plan of care Follow intermittent CXR  Cultures / abx as above   CARDIOVASCULAR A:  Asystolic Cardiac Arrest - approximately 9 minutes of CPR, told his sister yesterday that he was "tired" HTN P:  Tele monitoring  Await TEE  Hold home antihypertensives   RENAL A:   No acute issues  P:   Trend BMP / urinary output Replace electrolytes as indicated Avoid nephrotoxic agents, ensure adequate renal perfusion  GASTROINTESTINAL A:   Dysphagia s/p PEG  GERD  P:   NPO  PEG to LIS  GI prophylaxis  HEMATOLOGIC A:   Stage IV Squamous Cell Carcinoma of Head / Neck, widely metastatic on palliative chemotherapy  P:  Trend CBC  Will address goals of care with family   INFECTIOUS A:   Shock - presumed septic with recent MSSA bacteremia  MSSA Bacteremia - s/p port removal  HCAP  Fevers  P:   Cultures as above    ENDOCRINE A:   At Risk Hyper/Hypoglycemia   P:   Monitor glucose on BMP   NEUROLOGIC A:   Acute Anoxic Encephalopathy  Myoclonus s/p Cardiac Arrest  P:   RASS goal: 0  Ativan now given recurrent myoclonus  EEG with no epileptiform activity  Exam concerning for poor prognosis    FAMILY  - Updates: Family instructed to gather.  On arrival will update as a  group.   - Inter-disciplinary family meet or Palliative Care meeting due by:  11/6  CC Time:  24 minutes   Noe Gens, NP-C [facility] Pgr: [phone] or if no answer [phone] 12/19/2016, 3:11 PM

## 2016-12-19 NOTE — Progress Notes (Signed)
Antonio [name]   DOB:October 25, 1955   DU#:[id number]    Subjective: Overnight events are noted.  The patient's is noted to have abnormal posture while on ventilator  Objective:  Vitals:   12/19/16 1527 12/19/16 1530  BP:    Pulse: (!) 112 (!) 110  Resp: (!) 22 15  Temp:    SpO2: 100% 100%     Intake/Output Summary (Last 24 hours) at 12/19/2016 1627 Last data filed at 12/19/2016 1300 Gross per 24 hour  Intake 4672.26 ml  Output 1125 ml  Net 3547.26 ml    GENERAL: Sedated and intubated LUNGS: Ventilated.  Bilateral coarse crackles  hEART: Tachycardia, no murmurs, no lower extremity edema NEURO: Noted abnormal jerking movement   Labs:  Lab Results  Component Value Date   WBC 18.8 (H) 12/19/2016   HGB 6.7 (LL) 12/19/2016   HCT 19.7 (L) 12/19/2016   MCV 81.4 12/19/2016   PLT 252 12/19/2016   NEUTROABS 17.3 (H) 12/19/2016    Lab Results  Component Value Date   NA 136 12/19/2016   K 3.5 12/19/2016   CL 101 12/19/2016   CO2 28 12/19/2016    Studies:  Portable Chest Xray  Result Date: 12/19/2016 CLINICAL DATA:  61 year old male with respiratory failure. Subsequent encounter. EXAM: PORTABLE CHEST 1 VIEW COMPARISON:  12/19/2016 20:20 a.m. FINDINGS: Endotracheal tube tip 3.4 cm above the carina. Left PICC line tip distal superior vena cava level. Moderately large possibly loculated right-sided pleural effusion. Small left-sided pleural effusion. Basal atelectasis greater on right. Asymmetric airspace disease may represent pulmonary edema. No pneumothorax. Heart appears slightly enlarged. Right shoulder joint degenerative changes. Calcified aorta. IMPRESSION: Right greater left pleural effusions. Asymmetric airspace disease may represent pulmonary edema. Basilar atelectasis greater on the right. Limited for evaluating for underlying infiltrate or mass. Aortic Atherosclerosis (ICD10-I70.0). Electronically Signed   By: Genia Del M.D.   On: 12/19/2016 10:21   Dg Chest Port 1  View  Result Date: 12/19/2016 CLINICAL DATA:  Post intubation EXAM: PORTABLE CHEST 1 VIEW COMPARISON:  12/17/2016, 12/16/2016 FINDINGS: Endotracheal tube tip is about 3.9 cm superior to the carina. Left upper extremity catheter tip overlies the cavoatrial junction. Slight increased right-sided pleural effusion which appears loculated laterally. Small left pleural effusion. Cardiomegaly with central vascular congestion and mild diffuse interstitial edema. No pneumothorax. Aortic atherosclerosis. IMPRESSION: 1. Endotracheal tube tip about 3.9 cm superior to carina 2. Slight increased loculated appearing right pleural effusion. Small left pleural effusion 3. Cardiomegaly with vascular congestion and mild pulmonary edema. 4. No change in right basilar consolidation. Electronically Signed   By: Donavan Foil M.D.   On: 12/19/2016 02:34    Assessment & Plan:   Metastatic supraglottic cancer with lung, bone, liver and pleural metastases The patient presented with sepsis secondary to bacteremia and/or pneumonia Chemotherapy has been placed on hold and the family is informed that he would not be able to resume systemic chemotherapy Recent imaging studies showed disease progression  Anemia chronic illness The patient is asymptomatic He had received 1 unit of blood transfusion on December 05, 2016  MSSA bacteremia He was on antibiotic treatment  Acute brain injury Cause is unknown The traumatic brain injury could be secondary to falls, anoxic brain injury or intracranial metastasis cannot be excluded I recommend transitioning of care to comfort measures  Severeprotein calorie malnutrition Cancer associated pain He is on pain medicine  Discharge planning I have spent extensive time with  family today. We discussed prognosis being poor due to brain  injury, in addition to recent bacteremia and widespread cancer progression. I recommend transition of care to comfort measures and terminal  extubation I have addressed all questions and concerns  Spent approximately 1 hour with the family members today on review and discussion of goals of care  [name], MD 12/19/2016  4:27 PM

## 2016-12-20 ENCOUNTER — Other Ambulatory Visit: Payer: Self-pay | Admitting: *Deleted

## 2016-12-20 LAB — TYPE AND SCREEN
ABO/RH(D): O POS
ANTIBODY SCREEN: NEGATIVE
Unit division: 0

## 2016-12-20 LAB — GLUCOSE, CAPILLARY
GLUCOSE-CAPILLARY: 85 mg/dL (ref 65–99)
GLUCOSE-CAPILLARY: 81 mg/dL (ref 65–99)
Glucose-Capillary: 107 mg/dL — ABNORMAL HIGH (ref 65–99)
Glucose-Capillary: 90 mg/dL (ref 65–99)

## 2016-12-20 LAB — PROCALCITONIN: PROCALCITONIN: 0.64 ng/mL

## 2016-12-20 LAB — ECHOCARDIOGRAM COMPLETE
HEIGHTINCHES: 63 in
Weight: 2119.94 oz

## 2016-12-20 LAB — BPAM RBC
Blood Product Expiration Date: 201811302359
ISSUE DATE / TIME: 201811060936
Unit Type and Rh: 5100

## 2016-12-20 LAB — BODY FLUID CULTURE
Culture: NO GROWTH
GRAM STAIN: NONE SEEN

## 2016-12-20 LAB — CULTURE, BLOOD (ROUTINE X 2)
CULTURE: NO GROWTH
CULTURE: NO GROWTH
SPECIAL REQUESTS: ADEQUATE
Special Requests: ADEQUATE

## 2016-12-20 LAB — CREATININE, SERUM
CREATININE: 0.58 mg/dL — AB (ref 0.61–1.24)
GFR calc Af Amer: >60 mL/min (ref >60)
GFR calc non Af Amer: >60 mL/min (ref >60)

## 2016-12-20 MED ORDER — METRONIDAZOLE IN NACL 5-0.79 MG/ML-% IV SOLN
500.0000 mg | Freq: Three times a day (TID) | INTRAVENOUS | Status: DC
  Administered 2016-12-20 – 2016-12-21 (×3): 500 mg via INTRAVENOUS
  Filled 2016-12-20 (×4): qty 100

## 2016-12-20 MED ORDER — SODIUM CHLORIDE 0.9% FLUSH
10.0000 mL | Freq: Two times a day (BID) | INTRAVENOUS | Status: DC
  Administered 2016-12-20: 10 mL
  Administered 2016-12-20: 30 mL
  Administered 2016-12-21 – 2016-12-22 (×3): 10 mL

## 2016-12-20 MED ORDER — CEFAZOLIN SODIUM-DEXTROSE 2-4 GM/100ML-% IV SOLN
2.0000 g | Freq: Three times a day (TID) | INTRAVENOUS | Status: DC
  Administered 2016-12-20 – 2016-12-21 (×3): 2 g via INTRAVENOUS
  Filled 2016-12-20 (×4): qty 100

## 2016-12-20 MED ORDER — SODIUM CHLORIDE 0.9% FLUSH: 10.0000 mL | INTRAVENOUS | Status: DC | PRN

## 2016-12-20 MED ORDER — CHLORHEXIDINE GLUCONATE CLOTH 2 % EX PADS
6.0000 | MEDICATED_PAD | Freq: Every day | CUTANEOUS | Status: DC
  Administered 2016-12-20 – 2016-12-22 (×3): 6 via TOPICAL

## 2016-12-20 NOTE — Progress Notes (Signed)
Nutrition Follow-up  DOCUMENTATION CODES:   Severe malnutrition in context of chronic illness  INTERVENTION:  - Will monitor for plan.  NUTRITION DIAGNOSIS:   Malnutrition(Severe) related to chronic illness, cancer and cancer related treatments as evidenced by percent weight loss, severe depletion of muscle mass, severe depletion of body fat. -ongoing  GOAL:   Patient will meet greater than or equal to 90% of their needs -unmet/unable to meet.   MONITOR:   Vent status, Weight trends, Labs  REASON FOR ASSESSMENT:   Ventilator  ASSESSMENT:   Pt with PMH significant for supraglottic cancer s/p radiation/chemotherapy/resection, dysphagia s/p PEG, COPD, tobacco abuse, and HTN. Recently sent over from [facility] 10/2 for admission with concerning hypercalcemia. Presents this admission with sepsis PNA and anemia associated with chemotherapy.   11/7 Pt remains intubated. PEG was connected to LIS yesterday and TF order d/c'ed. No family/visitors present at this time. Reviewed Dr. Calton Dach note from yesterday and today; plan to continue supportive care measures until family is ready to move to comfort care measures. Plan is for one-way extubation when family is ready.  Patient is currently intubated on ventilator support MV: 8 L/min Temp (24hrs), Avg:99.1 F (37.3 C), Min:98.4 F (36.9 C), Max:100 F (37.8 C)   Medications reviewed; 20 mg IV Pepcid BID, 5 mg Reglan per PEG TID, 1 tablet Senokot per PEG BID.  Labs reviewed; CBGs: 107 and 90 mg/dL this AM, creatinine: 0.58 mg/dL.   IVF: NS @ 75 mL/hr.  Drip: Neo @ 40 mcg/min.    11/6 - Code Blue was initiated this AM around 1:15 AM and pt was transferred to the ICU, intubated.  - Per rounds this AM, pt may be a one-way extubation later today.     10/26 - Pt remains NPO at this time but Dr. Arman Filter note from this AM states "ok to give liquid PO for pt comfort."  - Pt with PEG and continues with 6 cartons Osmolite 1.5  per day with 100 mL free water every 4 hours (x6/day).  - This regimen provides 2130 kcal, 89 grams of protein, and 1686 mL free water.  - Weight yesterday was +9 lbs/4 kg from 10/23.  - He had thoracentesis yesterday with 1.5L removed.  - In rounds this AM, RN reported that pt had some issues with reflux yesterday but that pt has done well with TF this AM, no issues with TF via PEG.    Diet Order:  Diet NPO time specified  EDUCATION NEEDS:   No education needs identified at this time  Skin:  Skin Assessment: Skin Integrity Issues: Skin Integrity Issues:: Other (Comment)(R chest puncture 10/24 from port removal)  Last BM:  11/2  Height:   Ht Readings from Last 1 Encounters:  12/05/16 5\' 3"  (1.6 m)    Weight:   Wt Readings from Last 1 Encounters:  12/19/16 132 lb 7.9 oz (60.1 kg)    Ideal Body Weight:  56.4 kg  BMI:  Body mass index is 23.47 kg/m.  Estimated Nutritional Needs:   Kcal:  4496  Protein:  87-99 grams(1.5-1.7 grams/kg)  Fluid:  2.2 L fluid     Jarome Matin, MS, RD, LDN, [facility] Clinical Dietitian Pager # [phone] After hours/weekend pager # [phone]

## 2016-12-20 NOTE — Progress Notes (Signed)
Pharmacy - Brief Note (Cefazolin dosing)  Cefazolin for MSSA bacteremia d/t infected port-a-cath.  Plan: - Increase cefazolin from 1gm q8h to 2gm q8h for bacteremia  Doreene Eland, PharmD, BCPS.   Pager: [phone] 12/20/2016 7:39 AM

## 2016-12-20 NOTE — Patient Outreach (Signed)
[facility]) Care Management  12/20/2016  Antonio [name] 03-05-1955 [id number]  CSW noted that patient remains hospitalized for possible palliative care services.  Patient currently resides in the ICU (Intensive Care Unit), intubated, sedated and ventilated.  Comfort measures are being discussed with patient's family members.  Patient has been diagnosed with the following illnesses: Metastatic Supraglottic Cancer with Lung, Bone, Liver and PleuralMetastases Chronic Anemia  MSSA Bacteremia Acute Brain Injury SevereProtein Calorie Malnutrition Patient's prognosis is extremely poor, due to the severity of his brain injury.  CSW will continue to follow patient while hospitalized, then provide community case management services if patient is discharged from the hospital. Nat Christen, BSW, MSW, [place]  Licensed Clinical Social Worker  [facility]  [address] [place] [address], [place], [place] [address] Physical Address-[address], [place], [place] [address] Toll Free Main # [phone] Fax # [phone] Cell # [phone]  Office # [phone] [email or web address]

## 2016-12-20 NOTE — Progress Notes (Signed)
Consult :Comfort Care.  CSW following for needs, physician note indicates possible interdisciplinary family meet or Palliative care meeting.  CSW is available if needed.   Kathrin Greathouse, Latanya Presser, MSW Clinical Social Worker  [phone] 12/20/2016  2:19 PM

## 2016-12-20 NOTE — Progress Notes (Signed)
PULMONARY / CRITICAL CARE MEDICINE   Name: Antonio [name] MRN: [id number] DOB: 1955/03/27    ADMISSION DATE:  11/16/2016 CONSULTATION DATE:  12/19/16  REFERRING MD:  Dr. 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 He has undergone radiation therapy, palliative chemotherapy. Disease has progressed as of CT scan 11/13/16. Admitted with severe sepsis and found to have an MSSA bacteremia. His Port-A-Cath was identified as source and removed on 12/06/16. Possible associated pneumonia, right greater than left pleural effusion. Status post thoracentesis on 10/25, then repeat bilateral thoracenteses on 11/4 (1.3L off on left, 60cc off on right), consistent with an exudate. Pleural fluid culture negative with no malignant cells identified. He is being treated with continued ancef. Course complicated by persistent FUO, and pleural effusion. Repeat thora with no growth so far. Flagyl added for potential anaerobe coverage in pleural space.   11/6 early AM the patient suffered what is thought to be a respiratory > asystolic cardiac arrest. The floor nurse said she last checked on him at 12am at which time she was giving him his scheduled free water. She said he was asymptomatic at that time. At approx 1:10am, the patient's bed alarm went off and another nurse ran into the room to check on patient. He was found on the floor at that time but had a pulse and was moving. They helped get him back into bed. Shortly after getting him back into bed he became bradycardic then lost a pulse, with rhythm showing asystole (at time 1:15am). CPR initiated and ROSC achieved at 1:24am. Patient intubated by anesthesia at 1:30am then transferred to the ICU. At the time of my exam he is in shock with BP 60's/40's. Patient unresponsive and is having decerebrate posturing.    SUBJECTIVE:   No current evidence of myoclonic activity on Ativan 5 mg/h Discussed status, prognosis, evidence for anoxic brain injury with his family 11/6   VITAL SIGNS: Vitals:   12/20/16 0338 12/20/16 0400 12/20/16 0800 12/20/16 1017  BP: (!) 154/70     Pulse: 92   (!) 105  Resp: (!) 26  19 18   Temp:  99.8 F (37.7 C) 98.8 F (37.1 C)   TempSrc:  Axillary Oral   SpO2: 100%   100%  Weight:      Height:        HEMODYNAMICS:    VENTILATOR SETTINGS: Vent Mode: PRVC FiO2 (%):  [40 %-60 %] 40 % Set Rate:  [16 bmp] 16 bmp Vt Set:  [460 mL] 460 mL PEEP:  [8 cmH20] 8 cmH20 Plateau Pressure:  [23 cmH20-27 cmH20] 23 cmH20  INTAKE / OUTPUT: I/O last 3 completed shifts: In: 7062.4 [I.V.:6084.9; Blood:297.5; Other:30; NG/GT:250; IV Piggyback:400] Out: 1670 [Urine:1670]  PHYSICAL EXAMINATION: General: chronically ill appearing male with myoclonic jerking on vent  HEENT: MM pink/moist, ETT Neuro: eyes deviated to upward left, no response to verbal commands, myoclonic jerking, + cough with suction CV: s1s2 rrr, no m/r/g, chest wall port incision with scant drainage at site PULM: even/non-labored, lungs bilaterally coarse  ON:GEXB, non-tender, bsx4 active  Extremities: warm/dry, no edema  Skin: no rashes or lesions  LABS:  BMET Recent Labs  Lab 12/15/16 0449 12/16/16 0358 12/19/16 0238 12/20/16 0411  NA 134* 135 136  --   K 4.0 3.9 3.5  --   CL 94* 98* 101  --  CO2 31 30 28   --   BUN 13 12 13   --   CREATININE 0.55* 0.63 0.59* 0.58*  GLUCOSE 102* 119* 133*  --     Electrolytes Recent Labs  Lab 12/15/16 0449 12/16/16 0358 12/19/16 0238  CALCIUM 9.6 9.6 8.5*  MG  --   --  1.7  PHOS  --   --  3.3    CBC Recent Labs  Lab 12/16/16 0358 12/18/16 0321 12/19/16 0238  WBC 17.5* 15.4* 18.8*  HGB 7.8* 7.9* 6.7*  HCT 23.7* 24.6* 19.7*  PLT 337 365 252    Coag's Recent Labs  Lab 12/19/16 0238  INR 1.25    Sepsis Markers Recent Labs  Lab  12/19/16 0238 12/19/16 0305 12/19/16 0615 12/20/16 0411  LATICACIDVEN  --  2.2* 2.2*  --   PROCALCITON 0.38  --   --  0.64    ABG Recent Labs  Lab 12/19/16 0255  PHART 7.329*  PCO2ART 55.8*  PO2ART 113*    Liver Enzymes Recent Labs  Lab 12/19/16 0238  AST 39  ALT 25  ALKPHOS 106  BILITOT 0.6  ALBUMIN 1.6*    Cardiac Enzymes Recent Labs  Lab 12/19/16 0238 12/19/16 0615  TROPONINI 0.04* 0.04*    Glucose Recent Labs  Lab 12/19/16 0125 12/19/16 0316 12/19/16 0815 12/19/16 1211 12/20/16 0042 12/20/16 0732  GLUCAP 143* 136* 100* 112* 107* 90    Imaging No results found.   CERVICAL LYMPH NODE BIOPSY 05/18/16:  Squamous cell carcinoma   PET CT 06/08/16: Supraglottic laryngeal primary with left greater than right cervical and left supraclavicular nodal metastasis. Hypermetabolic right lower lobe pulmonary nodule is favored to represent a synchronous primary bronchogenic neoplasm. Isolated pulmonary metastasis could look similar. No thoracic nodal hypermetabolism. Soft tissue hypermetabolism about the right iliac bone and greater trochanter of the proximal right femur are favored to be degenerative or posttraumatic. Soft tissue metastasis felt highly unlikely. Coronary artery atherosclerosis. Aortic atherosclerosis. A tiny right lower lobe pulmonary nodule is most likely a subpleural lymph node.  CT CHEST W/ CONTRAST 10/1: Interim development of multiple enlarged mediastinal and hilar lymph nodes consistent with metastatic disease. Development of multiple bilateral pulmonary nodules, also suspicious for metastatic disease. Suspect that the previously noted right lower lobe lung mass has increased in size, now measuring about 26 mm compared with 19 mm previously. New small right-sided pleural effusion. Patchy consolidations in the right lower lobe, uncertain if this represents pneumonia, aspiration, or metastatic nodules. Subcentimeter hypodense foci in the liver, too  small to further characterize. When the patient is clinically stable and able to follow directions and hold their breath (preferably as an outpatient) further evaluation with dedicated abdominal MRI may be considered.  PORT CXR 10/24:  Personally reviewed by me. Persistent right basilar opacification with silhouetting of hemidiaphragm & fullness to right hilum. Streaky opacification with some suggestion of air bronchogram and consolidation within the left lower lung as well. Unable to appreciate pleural effusion meniscus. Right-sided port in place.  Echo 10/30 > LVEF 65-70%, trivial pericardial effusion. No vegetation noted.   CT chest 11/2: Large right pleural effusion again noted but now appears loculated. Compressive atelectasis of the right lower lung. Enlarging moderate to large left pleural effusion with left lower lung atelectasis. 2 adjacent 2.7 cm hypodense lesions within the hepatic dome, unchanged from 12/06/2016 but new since 11/13/2016. Although this could represent rapidly progressive metastatic disease, infection is not excluded given patient history. Pulmonary, mediastinal/hilar lymphatic and left eleventh  rib metastases as described. T9 vertebral body lesion, new since 11/13/2016. This probably represents rapid development of a metastasis as this would be an unusual appearance of infection.  ECHO 11/6 >>   EEG 11/6 >> diffuse cerebral dysfunction that is non-specific in etiology. Body jerks noted are not associated with epileptiform correlate, no electrographic seizures in this study.   MICROBIOLOGY:  Blood Cultures x2 10/22 >> MSSA Sputum Culture 10/22 >>> consistent with normal respiratory flora Group A Strep Culture 10/23 >>> negative MRSA PCR 10/23:  Negative Blood Culture x1 10/24 >>> R Chest Wound 10/24 >> abundant staph >> pansensitive R Pleural Fluid 10/25 >> WBC 1,234, 97% neutrophil, LD 388, total protein <3.   R Pleural Culture 10/25 >> no growth R Pleural Cytology  10/25 >> acute reactive and mesothelial cells.  No malignant cells identified Blood Cx 11/2 >>> Pleural fluid L 11/3 >>> Pleural fluid R 11/4 >>>  ANTIBIOTICS: Zosyn 10/22 (x1 dose) Vancomycin 10/22 (x1 dose) Cefepime 10/23 (x2 doses) Ancef 10/23 >>> 11/5; 11/6 >>  Flagyl 11/5 >>> Vanco 11/5 >> 11/6 Zosyn 11/5 >> 11/6  SIGNIFICANT EVENTS  10/22 - Admitted 10/23 - Transferred to SDU w/ repeat episode hypotension 10/25 - Thoracentesis, R side with 1.5 L bloody/cloudy fluid removed   DISCUSSION: 61 y.o. male with stage IV metastatic squamous cell carcinoma. Previous imaging indicated evolving metastatic disease of the lungs. Patient admitted with sepsis secondary to MSSA bacteremia.  On ancef for 28 day course, Port-A-Cath removed. Hospital course complicated by ongoing fevers and pleural effusions. Now complicated by asystolic cardiac arrest 81/1.  ASSESSMENT / PLAN:  PULMONARY A: Acute hypoxemic respiratory failure HCAP  Bilateral Pleural Effusion - s/p thora x3, exudative P:   Poor prognosis for meaningful recovery, ventilator liberation.  Family aware and processing this information.  Continue current ventilator support and conversations with family regarding possible withdrawal of care in the coming days Pending any decision regarding withdrawal of care continue antibiotics as below  CARDIOVASCULAR A:  Asystolic Cardiac Arrest - approximately 9 minutes of CPR, told his sister yesterday that he was "tired" HTN Shock, multifactorial due to sedation, probably cardiogenic and septic components P:  Home antihypertensive medications on hold TTE performed, results pending Max dose phenylephrine at current dose of 40, no plans to escalate  RENAL A:   No acute issues  P:   Follow BMP intermittently, urine output.  Attempt to avoid labs if possible Defer electrolyte replacement   GASTROINTESTINAL A:   Dysphagia s/p PEG  GERD  P:   NPO  No plans to initiate tube  feeding at this time GI prophylaxis   HEMATOLOGIC / ONC A:   Stage IV Squamous Cell Carcinoma of Head / Neck, widely metastatic on palliative chemotherapy  P:  Follow CBC intermittently.  Avoid labs if able Greatly appreciate assistance and input from Dr [name]   INFECTIOUS A:   Shock - presumed septic with recent MSSA bacteremia  MSSA Bacteremia - s/p port removal  HCAP  Fevers  P:   Follow culture data Continue current antibiotics pending family decisions regarding withdrawal of care   ENDOCRINE A:   At Risk Hyper/Hypoglycemia   P:   Discontinue CBG checks  NEUROLOGIC A:   Acute Anoxic Encephalopathy  Myoclonus s/p Cardiac Arrest  P:   RASS goal: 0  Ativan infusion, myoclonus has improved Very poor prognosis.  Continue to discuss with the patient's family regarding withdrawal of care.  They are still processing and are not ready  yet.  If we need to involve neurology for formal testing, prognostication then we will do so depending on their needs.   FAMILY  - Updates: Updated family is a group 11/6 regarding patient's brain injury, neurological status, neurological prognosis.  I have recommended no escalation of care and they agree.  I have also recommended withdrawal of life-sustaining care.  They are processing this, understand the issues.  They do have some remaining understandable questions regarding what happened at the time of his acute decompensation.  We will work to try and get these answers for them.  - Inter-disciplinary family meet or Palliative Care meeting due by:  Ongoing  Independent CC time 33 minutes  Baltazar Apo, MD, PhD 12/20/2016, 10:36 AM [facility] [phone] or if no answer [phone]

## 2016-12-20 NOTE — Progress Notes (Signed)
Antonio [name]   DOB:February 28, 1955   ZO#:[id number]    Subjective: Patient appeared sedated, ventilated and peaceful. Multiple family members are present  Objective:  Vitals:   12/20/16 0338 12/20/16 0400  BP: (!) 154/70   Pulse: 92   Resp: (!) 26   Temp:  99.8 F (37.7 C)  SpO2: 100%      Intake/Output Summary (Last 24 hours) at 12/20/2016 0820 Last data filed at 12/20/2016 0800 Gross per 24 hour  Intake 4195.68 ml  Output 1020 ml  Net 3175.68 ml    NEURO: intubated, sedated and ventilated. I did not see any jerking movement today  Labs:  Lab Results  Component Value Date   WBC 18.8 (H) 12/19/2016   HGB 6.7 (LL) 12/19/2016   HCT 19.7 (L) 12/19/2016   MCV 81.4 12/19/2016   PLT 252 12/19/2016   NEUTROABS 17.3 (H) 12/19/2016    Lab Results  Component Value Date   NA 136 12/19/2016   K 3.5 12/19/2016   CL 101 12/19/2016   CO2 28 12/19/2016    Studies:  Portable Chest Xray  Result Date: 12/19/2016 CLINICAL DATA:  61 year old male with respiratory failure. Subsequent encounter. EXAM: PORTABLE CHEST 1 VIEW COMPARISON:  12/19/2016 20:20 a.m. FINDINGS: Endotracheal tube tip 3.4 cm above the carina. Left PICC line tip distal superior vena cava level. Moderately large possibly loculated right-sided pleural effusion. Small left-sided pleural effusion. Basal atelectasis greater on right. Asymmetric airspace disease may represent pulmonary edema. No pneumothorax. Heart appears slightly enlarged. Right shoulder joint degenerative changes. Calcified aorta. IMPRESSION: Right greater left pleural effusions. Asymmetric airspace disease may represent pulmonary edema. Basilar atelectasis greater on the right. Limited for evaluating for underlying infiltrate or mass. Aortic Atherosclerosis (ICD10-I70.0). Electronically Signed   By: Genia Del M.D.   On: 12/19/2016 10:21   Dg Chest Port 1 View  Result Date: 12/19/2016 CLINICAL DATA:  Post intubation EXAM: PORTABLE CHEST 1 VIEW COMPARISON:   12/17/2016, 12/16/2016 FINDINGS: Endotracheal tube tip is about 3.9 cm superior to the carina. Left upper extremity catheter tip overlies the cavoatrial junction. Slight increased right-sided pleural effusion which appears loculated laterally. Small left pleural effusion. Cardiomegaly with central vascular congestion and mild diffuse interstitial edema. No pneumothorax. Aortic atherosclerosis. IMPRESSION: 1. Endotracheal tube tip about 3.9 cm superior to carina 2. Slight increased loculated appearing right pleural effusion. Small left pleural effusion 3. Cardiomegaly with vascular congestion and mild pulmonary edema. 4. No change in right basilar consolidation. Electronically Signed   By: Donavan Foil M.D.   On: 12/19/2016 02:34    Assessment & Plan:  Metastatic supraglottic cancer with lung, bone, liver and pleural metastases Anemia chronic illness MSSA bacteremia Acute brain injury Severeprotein calorie malnutrition Cancer associated pain  I interacted with multiple family members.  His sister had expressed concern that we are "moving too fast" with plans to transitioning his care. I explained to her, due to the severity of his brain injury, his prognosis is extremely poor and the rationale of discussion related to transition of care is to minimize suffering and preserving dignity.  If family members are not comfortable to transition his care, we will continue current supportive care measures until they are ready.  A few other family members had asked many questions related to the events leading to his brain injury. They are demanding answers to questions related to "whether the patient bed rail was up", "how long was he on the floor before he was resuscitated", "whether seizure activities were noted, etc".  To address these questions, I have relayed them and spoken with the charge nurse on the fourth floor, Baxter Flattery.  Apparently, safety zone event has been filed. She will try to address these  questions with the family later.  The patient's family concerns are related to primary service as well.  [name], MD 12/20/2016  8:20 AM

## 2016-12-21 DIAGNOSIS — G931 Anoxic brain damage, not elsewhere classified: Secondary | ICD-10-CM

## 2016-12-21 LAB — CULTURE, RESPIRATORY W GRAM STAIN

## 2016-12-21 LAB — PROCALCITONIN: PROCALCITONIN: 0.46 ng/mL

## 2016-12-21 LAB — GLUCOSE, CAPILLARY: GLUCOSE-CAPILLARY: 91 mg/dL (ref 65–99)

## 2016-12-21 LAB — CULTURE, BODY FLUID-BOTTLE: CULTURE: NO GROWTH

## 2016-12-21 LAB — CULTURE, BODY FLUID W GRAM STAIN -BOTTLE

## 2016-12-21 LAB — TRIGLYCERIDES: Triglycerides: 69 mg/dL (ref <150)

## 2016-12-21 MED ORDER — CHLORHEXIDINE GLUCONATE 0.12 % MT SOLN
OROMUCOSAL | Status: AC
  Filled 2016-12-21: qty 15

## 2016-12-21 MED ORDER — PROPOFOL 1000 MG/100ML IV EMUL
5.0000 ug/kg/min | INTRAVENOUS | Status: DC
Start: 2016-12-21 — End: 2016-12-22
  Administered 2016-12-21 – 2016-12-22 (×2): 5 ug/kg/min via INTRAVENOUS
  Filled 2016-12-21 (×3): qty 100

## 2016-12-21 MED ORDER — DEXTROSE 5 % IV SOLN
2.0000 g | Freq: Three times a day (TID) | INTRAVENOUS | Status: DC
  Administered 2016-12-21 – 2016-12-22 (×4): 2 g via INTRAVENOUS
  Filled 2016-12-21 (×5): qty 2

## 2016-12-21 NOTE — Progress Notes (Signed)
RT attempted to wean the pt. The Pt's HR and RR increased. The Pt has a lot of anxiety when you touch him. RT will continue to monitor

## 2016-12-21 NOTE — Progress Notes (Signed)
PULMONARY / CRITICAL CARE MEDICINE   Name: Antonio [name] MRN: [id number] DOB: Jun 30, 1955    ADMISSION DATE:  12/07/2016 CONSULTATION DATE:  12/19/16  REFERRING MD:  Dr. 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 He has undergone radiation therapy, palliative chemotherapy. Disease has progressed as of CT scan 11/13/16. Admitted with severe sepsis and found to have an MSSA bacteremia. His Port-A-Cath was identified as source and removed on 12/06/16. Possible associated pneumonia, right greater than left pleural effusion. Status post thoracentesis on 10/25, then repeat bilateral thoracenteses on 11/4 (1.3L off on left, 60cc off on right), consistent with an exudate. Pleural fluid culture negative with no malignant cells identified. He is being treated with continued ancef. Course complicated by persistent FUO, and pleural effusion. Repeat thora with no growth so far. Flagyl added for potential anaerobe coverage in pleural space.   11/6 early AM the patient suffered what is thought to be a respiratory > asystolic cardiac arrest. The floor nurse said she last checked on him at 12am at which time she was giving him his scheduled free water. She said he was asymptomatic at that time. At approx 1:10am, the patient's bed alarm went off and another nurse ran into the room to check on patient. He was found on the floor at that time but had a pulse and was moving. They helped get him back into bed. Shortly after getting him back into bed he became bradycardic then lost a pulse, with rhythm showing asystole (at time 1:15am). CPR initiated and ROSC achieved at 1:24am. Patient intubated by anesthesia at 1:30am then transferred to the ICU. At the time of my exam he is in shock with BP 60's/40's. Patient unresponsive and is having decerebrate posturing.    SUBJECTIVE:  RN reports pt spontaneously opening eyes with occasional fluttering but patient does not make eye contact.  No evidence of myoclonus.  Neo-Synephrine weaned to 5 mcg.  Ativan at 5 mg/h.   VITAL SIGNS: Vitals:   12/21/16 0500 12/21/16 0600 12/21/16 0800 12/21/16 0900  BP:    131/62  Pulse: 97 (!) 103  (!) 104  Resp: 18 18  18   Temp: 99.9 F (37.7 C)  99.1 F (37.3 C)   TempSrc:   Axillary   SpO2: 100% 100%  100%  Weight:      Height:        HEMODYNAMICS:    VENTILATOR SETTINGS: Vent Mode: PRVC FiO2 (%):  [40 %] 40 % Set Rate:  [16 bmp-18 bmp] 18 bmp Vt Set:  [460 mL] 460 mL PEEP:  [8 cmH20] 8 cmH20 Plateau Pressure:  [22 cmH20-28 cmH20] 23 cmH20  INTAKE / OUTPUT: I/O last 3 completed shifts: In: 7721 [I.V.:6496; Other:175; IV Piggyback:1050] Out: 2000 [Urine:2000]  PHYSICAL EXAMINATION: General: Chronically ill-appearing adult male on mechanical ventilation HEENT: MM pink/moist, ETT Neuro: Downward gaze, pupils 2-3 mm, positive corneals, positive gag, tachypnea with low volumes on PSV CV: s1s2 rrr, no m/r/g. PULM: even/non-labored, lungs bilaterally coarse GI: soft, non-tender, bsx4 active  Extremities: warm/dry, no edema  Skin: no rashes or lesions.  Right chest wall port site c/d/i  LABS:  BMET Recent Labs  Lab 12/15/16 0449 12/16/16 0358 12/19/16 0238 12/20/16 0411  NA 134* 135 136  --   K 4.0 3.9 3.5  --   CL 94* 98* 101  --  CO2 31 30 28   --   BUN 13 12 13   --   CREATININE 0.55* 0.63 0.59* 0.58*  GLUCOSE 102* 119* 133*  --     Electrolytes Recent Labs  Lab 12/15/16 0449 12/16/16 0358 12/19/16 0238  CALCIUM 9.6 9.6 8.5*  MG  --   --  1.7  PHOS  --   --  3.3    CBC Recent Labs  Lab 12/16/16 0358 12/18/16 0321 12/19/16 0238  WBC 17.5* 15.4* 18.8*  HGB 7.8* 7.9* 6.7*  HCT 23.7* 24.6* 19.7*  PLT 337 365 252    Coag's Recent Labs  Lab 12/19/16 0238  INR 1.25    Sepsis Markers Recent Labs  Lab 12/19/16 0238  12/19/16 0305 12/19/16 0615 12/20/16 0411 12/21/16 0337  LATICACIDVEN  --  2.2* 2.2*  --   --   PROCALCITON 0.38  --   --  0.64 0.46    ABG Recent Labs  Lab 12/19/16 0255  PHART 7.329*  PCO2ART 55.8*  PO2ART 113*    Liver Enzymes Recent Labs  Lab 12/19/16 0238  AST 39  ALT 25  ALKPHOS 106  BILITOT 0.6  ALBUMIN 1.6*    Cardiac Enzymes Recent Labs  Lab 12/19/16 0238 12/19/16 0615  TROPONINI 0.04* 0.04*    Glucose Recent Labs  Lab 12/19/16 1211 12/20/16 0042 12/20/16 0732 12/20/16 1127 12/20/16 1602 12/21/16 0317  GLUCAP 112* 107* 90 85 81 91    Imaging No results found.   CERVICAL LYMPH NODE BIOPSY 05/18/16:  Squamous cell carcinoma   PET CT 06/08/16: Supraglottic laryngeal primary with left greater than right cervical and left supraclavicular nodal metastasis. Hypermetabolic right lower lobe pulmonary nodule is favored to represent a synchronous primary bronchogenic neoplasm. Isolated pulmonary metastasis could look similar. No thoracic nodal hypermetabolism. Soft tissue hypermetabolism about the right iliac bone and greater trochanter of the proximal right femur are favored to be degenerative or posttraumatic. Soft tissue metastasis felt highly unlikely. Coronary artery atherosclerosis. Aortic atherosclerosis. A tiny right lower lobe pulmonary nodule is most likely a subpleural lymph node.  CT CHEST W/ CONTRAST 10/1: Interim development of multiple enlarged mediastinal and hilar lymph nodes consistent with metastatic disease. Development of multiple bilateral pulmonary nodules, also suspicious for metastatic disease. Suspect that the previously noted right lower lobe lung mass has increased in size, now measuring about 26 mm compared with 19 mm previously. New small right-sided pleural effusion. Patchy consolidations in the right lower lobe, uncertain if this represents pneumonia, aspiration, or metastatic nodules. Subcentimeter hypodense foci in the liver, too  small to further characterize. When the patient is clinically stable and able to follow directions and hold their breath (preferably as an outpatient) further evaluation with dedicated abdominal MRI may be considered.  PORT CXR 10/24:  Personally reviewed by me. Persistent right basilar opacification with silhouetting of hemidiaphragm & fullness to right hilum. Streaky opacification with some suggestion of air bronchogram and consolidation within the left lower lung as well. Unable to appreciate pleural effusion meniscus. Right-sided port in place.  Echo 10/30 > LVEF 65-70%, trivial pericardial effusion. No vegetation noted.   CT chest 11/2: Large right pleural effusion again noted but now appears loculated. Compressive atelectasis of the right lower lung. Enlarging moderate to large left pleural effusion with left lower lung atelectasis. 2 adjacent 2.7 cm hypodense lesions within the hepatic dome, unchanged from 12/06/2016 but new since 11/13/2016. Although this could represent rapidly progressive metastatic disease, infection is not excluded given patient history.  Pulmonary, mediastinal/hilar lymphatic and left eleventh rib metastases as described. T9 vertebral body lesion, new since 11/13/2016. This probably represents rapid development of a metastasis as this would be an unusual appearance of infection.  ECHO 11/6 >> LVEF 65-70%, no wall motion abnormalities, grade 2 diastolic dysfunction, calcified aortic valve but cannot rule out small vegetation on the valve   EEG 11/6 >> diffuse cerebral dysfunction that is non-specific in etiology. Body jerks noted are not associated with epileptiform correlate, no electrographic seizures in this study.   MICROBIOLOGY:  Blood Cultures x2 10/22 >> MSSA Sputum Culture 10/22 >>> consistent with normal respiratory flora Group A Strep Culture 10/23 >>> negative MRSA PCR 10/23:  Negative Blood Culture x1 10/24 >>> R Chest Wound 10/24 >> abundant staph >>  pansensitive R Pleural Fluid 10/25 >> WBC 1,234, 97% neutrophil, LD 388, total protein <3.   R Pleural Culture 10/25 >> no growth R Pleural Cytology 10/25 >> acute reactive and mesothelial cells.  No malignant cells identified Blood Cx 11/2 >> active Pleural fluid L 11/3 >> Pleural fluid R 11/4 >> negative  ANTIBIOTICS: Zosyn 10/22 (x1 dose) Vancomycin 10/22 (x1 dose) Cefepime 10/23 (x2 doses) Ancef 10/23 >>> 11/5; 11/6 >>  Flagyl 11/5 >>> Vanco 11/5 >> 11/6 Zosyn 11/5 >> 11/6  SIGNIFICANT EVENTS  10/22 - Admitted 10/23 - Transferred to SDU w/ repeat episode hypotension 10/25 - Thoracentesis, R side with 1.5 L bloody/cloudy fluid removed  43/15 - asystolic arrest with concern for myoclonus.  EEG with diffuse cerebral dysfunction, no seizure  DISCUSSION: 61 y.o. male with stage IV metastatic squamous cell carcinoma. Previous imaging indicated evolving metastatic disease of the lungs. Patient admitted with sepsis secondary to MSSA bacteremia.  On ancef for 28 day course, Port-A-Cath removed. Hospital course complicated by ongoing fevers and pleural effusions. Now complicated by asystolic cardiac arrest 40/0.  ASSESSMENT / PLAN:  PULMONARY A: Acute hypoxemic respiratory failure HCAP  Bilateral Pleural Effusion - s/p thora x3, exudative P:   Continue ventilator support.  Family aware that this is likely a poor prognosis.   We will continue support until decisions made per family  CARDIOVASCULAR A:  Asystolic Cardiac Arrest - approximately 9 minutes of CPR, told his sister yesterday that he was "tired" HTN Shock, multifactorial due to sedation, probably cardiogenic and septic components Questionable vegetation on aortic valve on TTE P:  Hold home antihypertensives TTE as above Phenylephrine weaned off, would not restart given concern for anoxic injury  RENAL A:   No acute issues  P:   Follow BMP intermittently Monitor urinary output  GASTROINTESTINAL A:    Dysphagia s/p PEG  GERD  P:   NPO Continue Pepcid for GI prophylaxis Hold tube feeding  HEMATOLOGIC / ONC A:   Stage IV Squamous Cell Carcinoma of Head / Neck, widely metastatic on palliative chemotherapy  P:  Follow CBC intermittently [name] following, appreciate input  INFECTIOUS A:   Shock - presumed septic with recent MSSA bacteremia  MSSA Bacteremia - s/p port removal  HCAP  Fevers  P:   Follow cultures Continue antibiotics pending family's decision regarding plan of care  ENDOCRINE A:   At Risk Hyper/Hypoglycemia   P:   Monitor glucose on BMP  NEUROLOGIC A:   Acute Anoxic Encephalopathy  Myoclonus s/p Cardiac Arrest  P:   RASS goal: 0 Continue Ativan infusion for myoclonus Concern for poor prognosis.  This has been previously discussed with the family.  Given responses based on prior discussion and  interactions since that discussion I am not sure that they will be able to make a decision to withdraw care.  However, his chances for meaningful recovery are very poor.  Not to mention his cancer has progressed from previous imaging per oncology.   FAMILY  - Updates: No family available a.m. 11/8 on NP rounding.  Prior discussion from 11/6 >> updated family as a group regarding patient's brain injury, neurological status, neurological prognosis.  Recommended no escalation of care and they agree.  Also recommended withdrawal of life-sustaining care.  They are processing this, understand the issues.  They do have some remaining understandable questions regarding what happened at the time of his acute decompensation.  We will work to try and get these answers for them.  - Inter-disciplinary family meet or Palliative Care meeting due by:  Ongoing  CC Time:  67 minutes   Noe Gens, NP-C [facility] Pgr: [phone] or if no answer [phone] 12/21/2016, 10:24 AM

## 2016-12-21 NOTE — Progress Notes (Addendum)
Family updated (sister June and nephew's) at bedside per NP.  Reviewed his current neurological exam.  Discussed plan of care with Dr. Lamonte Sakai.  Will transition to propofol for sedation to allow for more rapid neuro exam / wake up from sedation.  Offered to meet with the family again to further discuss plan of care and patients status.  Will follow up.    Noe Gens, NP-C [facility] Pgr: [phone] or if no answer [phone] 12/21/2016, 1:19 PM

## 2016-12-21 NOTE — Progress Notes (Signed)
Pharmacy Antibiotic Note  Antonio [name] is a 61 y.o. male admitted on 11/14/2016 with pneumonia and bacteremia.  Pharmacy has been consulted for cefepime dosing.  Patient known to pharmacy for previously dosing cefazolin for MSSA bacteremia related to Portacath.  Trach aspirate from 11/6 growing pan-sensitive pseudomonas  Today, 12/21/2016 Day #16 of 28 antibiotics for treating MSSA bacteremia - renal fx - SCr WNL - WBC inc on last check - Tm = 100.7  Plan:  After discussion with PCCM, change cefazolin to cefepime for pseudomonas and MSSA.  Cefepime 2gm IV q8h   Pharmacy to follow at distance as do not anticipate need for renal dose adjustment at this time  Height: 5\' 3"  (160 cm) Weight: 132 lb 7.9 oz (60.1 kg) IBW/kg (Calculated) : 56.9  Temp (24hrs), Avg:99.6 F (37.6 C), Min:98.9 F (37.2 C), Max:100.7 F (38.2 C)  Recent Labs  Lab 12/15/16 0449 12/16/16 0358 12/18/16 0321 12/19/16 0238 12/19/16 0305 12/19/16 0615 12/20/16 0411  WBC 18.3* 17.5* 15.4* 18.8*  --   --   --   CREATININE 0.55* 0.63  --  0.59*  --   --  0.58*  LATICACIDVEN  --   --   --   --  2.2* 2.2*  --     Estimated Creatinine Clearance: 79 mL/min (A) (by C-G formula based on SCr of 0.58 mg/dL (L)).    No Known Allergies  Antimicrobials this admission:  10/22 Zosyn x 1; resume 11/6 >> 11/6 10/22 Vanc >>10/23; resume 11/6 >> 11/6 10/23 Cefepime >> 10/23, 11/8 >> 10/23 Ancef>> 11/8 11/5 flagyl>> (to stop 11/15)  Microbiology results:  10/22 BCx: 4/4 MSSA 10/22 Sputum: normal flora 10/22 S Pneumo and Legionella UAg: neg/neg 10/22 Flu panel, rapid strep, Strep Cx: all neg 10/22 MRSA PCR: neg 10/24 BCx: NGF 10/24 "wound" (PAC removal site): abundant MSSA 10/25 Pleural: NGF 11/2 BCx: ngtd 11/3 R Pleural fluid: ngtd 11/4 Pleural fluid: ngtd 11/6 trach aspirate: pseudomonas aeruginosa (pans-susc) 11/6 BCx: NGTD   Thank you for allowing pharmacy to be a part of this patient's care.  Doreene Eland, PharmD, BCPS.   Pager: [phone] 12/21/2016 11:17 AM

## 2016-12-21 NOTE — Progress Notes (Signed)
Palliative care progress note Reason for consult: Goals of care  I saw and examined Antonio [name] today.  His sister, Antonio [name], and 1 of his brother-in-law's was at bedside today.  I reviewed with family that was present that Antonio [name] has always been a very independent man.  He was living alone prior to this hospitalization, and during my prior encounter with him a couple of weeks ago he stated this is ultimately what is most important to him.    His sister reports that the physicians have been doing a good job explaining things to them.  We reviewed his clinical course and the severity of his situation.  His family is still grappling with the fact that he has had an acute decline while reporting that they understand that he has had incurable malignancy for some time.  We discussed plan for CT scan tomorrow and his sister reports that his family has been talking about having another family meeting with critical care team.  On examination Antonio [name] is unresponsive.  He did seem to have slight movement when I called his name, but this was not something that repeated when I attempted a second time.  I do think the family would benefit from having another family meeting.  I will reach out to critical care if it would be helpful for me to meet with them in conjunction with family.  Total time 50 minutes Greater than 50%  of this time was spent counseling and coordinating care related to the above assessment and plan.  Micheline Rough, MD [place] Team [phone]

## 2016-12-21 NOTE — Consult Note (Signed)
   [facility] CM Inpatient Consult   12/21/2016  Antonio [name] 1955-08-30 [id number]     [facility] Management follow up.  Chart reviewed. Antonio [name] remains in ICU on ventilator.   Updated [facility] team about Antonio [name] poor prognosis.  Will continue to follow.   Marthenia Rolling, MSN-Ed, RN,BSN [facility] Liaison [phone]

## 2016-12-22 ENCOUNTER — Inpatient Hospital Stay (HOSPITAL_COMMUNITY): Payer: Medicare Other

## 2016-12-22 LAB — BASIC METABOLIC PANEL
Anion gap: 8 (ref 5–15)
BUN: 15 mg/dL (ref 6–20)
CHLORIDE: 113 mmol/L — AB (ref 101–111)
CO2: 25 mmol/L (ref 22–32)
CREATININE: 0.55 mg/dL — AB (ref 0.61–1.24)
Calcium: 9.4 mg/dL (ref 8.9–10.3)
GFR calc non Af Amer: >60 mL/min (ref >60)
Glucose, Bld: 104 mg/dL — ABNORMAL HIGH (ref 65–99)
POTASSIUM: 2.7 mmol/L — AB (ref 3.5–5.1)
SODIUM: 146 mmol/L — AB (ref 135–145)

## 2016-12-22 LAB — CBC
HEMATOCRIT: 23.1 % — AB (ref 39.0–52.0)
Hemoglobin: 7.7 g/dL — ABNORMAL LOW (ref 13.0–17.0)
MCH: 26.7 pg (ref 26.0–34.0)
MCHC: 33.3 g/dL (ref 30.0–36.0)
MCV: 80.2 fL (ref 78.0–100.0)
PLATELETS: 298 10*3/uL (ref 150–400)
RBC: 2.88 MIL/uL — AB (ref 4.22–5.81)
RDW: 18.2 % — ABNORMAL HIGH (ref 11.5–15.5)
WBC: 14.2 10*3/uL — ABNORMAL HIGH (ref 4.0–10.5)

## 2016-12-22 MED ORDER — SODIUM CHLORIDE 0.9 % IV SOLN
10.0000 mg/h | INTRAVENOUS | Status: DC
  Filled 2016-12-22: qty 10

## 2016-12-22 MED ORDER — POTASSIUM CHLORIDE 20 MEQ/15ML (10%) PO SOLN
40.0000 meq | Freq: Two times a day (BID) | ORAL | Status: DC
  Administered 2016-12-22: 40 meq
  Filled 2016-12-22: qty 30

## 2016-12-22 MED ORDER — SODIUM CHLORIDE 0.9 % IV SOLN
10.0000 mg/h | INTRAVENOUS | Status: DC
  Administered 2016-12-22: 10 mg/h via INTRAVENOUS
  Filled 2016-12-22: qty 10

## 2016-12-22 MED ORDER — MORPHINE SULFATE (PF) 4 MG/ML IV SOLN: 2.0000 mg | INTRAVENOUS | Status: DC | PRN

## 2016-12-22 MED ORDER — MORPHINE BOLUS VIA INFUSION
5.0000 mg | INTRAVENOUS | Status: DC | PRN
  Filled 2016-12-22: qty 20

## 2016-12-24 LAB — CULTURE, BLOOD (ROUTINE X 2)
CULTURE: NO GROWTH
CULTURE: NO GROWTH
SPECIAL REQUESTS: ADEQUATE
SPECIAL REQUESTS: ADEQUATE

## 2016-12-25 ENCOUNTER — Ambulatory Visit: Payer: Self-pay | Admitting: *Deleted

## 2016-12-25 ENCOUNTER — Other Ambulatory Visit: Payer: Self-pay | Admitting: *Deleted

## 2016-12-25 NOTE — Patient Outreach (Signed)
[facility]) Care Management  12/25/2016  Antonio [name] May 22, 1955 [id number]   CSW will perform a case closure on patient, as patient is now deceased. Nat Christen, BSW, MSW, LCSW  Licensed Education officer, [facility]  Mailing [name] [place], [place] [address] Physical Address-[address], [place], [place] [address] Toll Free Main # [phone] Fax # [phone] Cell # [phone]  Office # [phone] [email or web address]

## 2016-12-26 ENCOUNTER — Telehealth: Payer: Self-pay

## 2016-12-26 ENCOUNTER — Ambulatory Visit: Payer: Self-pay

## 2016-12-26 ENCOUNTER — Other Ambulatory Visit: Payer: Self-pay

## 2016-12-26 NOTE — Telephone Encounter (Signed)
On 12/26/16 I received a d/c from [facility] (original). The d/c is for burial. The patient is a patient of Doctor Ramaswamy. The d/c will be taken to Pulmonary Unit @ [facility] for signature.  On 12/27/16 I received the d/c back from Doctor Ramaswamy. I got the d/c ready and called the funeral home to let them know the d/c is ready for pickup.

## 2017-01-02 ENCOUNTER — Other Ambulatory Visit: Payer: Self-pay

## 2017-01-02 ENCOUNTER — Ambulatory Visit: Payer: Self-pay

## 2017-01-13 NOTE — Progress Notes (Signed)
Patient's family has agreed to withdraw care. Per family request , patient started on Morphine gtt. Patient extubated at [place]. VS= HR 125, SpO2 52

## 2017-01-13 NOTE — Plan of Care (Signed)
  Progressing Physical Regulation: Will remain free from infection Jan 11, 2017 1130 - Progressing by Staci Righter, RN Safety: Ability to remain free from injury will improve January 11, 2017 1130 - Progressing by Staci Righter, RN

## 2017-01-13 NOTE — Consult Note (Signed)
   [facility] CM Inpatient Consult   12/25/2016  Ein Rijo 01-21-1956 [id number]    [place] Management follow up.  Chart reviewed. Noted Mr. Bisig expired.  Will update [facility] Care Management team.   Marthenia Rolling, [place], RN,BSN [facility] Liaison [phone]

## 2017-01-13 NOTE — Progress Notes (Signed)
160 mL of morphine infusion wasted, witnessed by Worthy Keeler, RN.

## 2017-01-13 NOTE — H&P (Signed)
Potassium 2.7- Hospitalist notified via text message.

## 2017-01-13 NOTE — Progress Notes (Signed)
PULMONARY / CRITICAL CARE MEDICINE   Name: Antonio [name] MRN: [id number] DOB: August 11, 1955    ADMISSION DATE:  11/24/2016 CONSULTATION DATE:  12/19/16  REFERRING MD:  Dr. 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 He has undergone radiation therapy, palliative chemotherapy. Disease has progressed as of CT scan 11/13/16. Admitted with severe sepsis and found to have an MSSA bacteremia. His Port-A-Cath was identified as source and removed on 12/06/16. Possible associated pneumonia, right greater than left pleural effusion. Status post thoracentesis on 10/25, then repeat bilateral thoracenteses on 11/4 (1.3L off on left, 60cc off on right), consistent with an exudate. Pleural fluid culture negative with no malignant cells identified. He is being treated with continued ancef. Course complicated by persistent FUO, and pleural effusion. Repeat thora with no growth so far. Flagyl added for potential anaerobe coverage in pleural space.   11/6 early AM the patient suffered what is thought to be a respiratory > asystolic cardiac arrest. The floor nurse said she last checked on him at 12am at which time she was giving him his scheduled free water. She said he was asymptomatic at that time. At approx 1:10am, the patient's bed alarm went off and another nurse ran into the room to check on patient. He was found on the floor at that time but had a pulse and was moving. They helped get him back into bed. Shortly after getting him back into bed he became bradycardic then lost a pulse, with rhythm showing asystole (at time 1:15am). CPR initiated and ROSC achieved at 1:24am. Patient intubated by anesthesia at 1:30am then transferred to the ICU. At the time of my exam he is in shock with BP 60's/40's. Patient unresponsive and is having decerebrate posturing.    SUBJECTIVE:  RN reports propofol has been off since 0800.  No purposeful movement from patient, reports no response to pain.  K low on am labs.     VITAL SIGNS: Vitals:   01/11/2017 0600 2017/01/11 0700 11-Jan-2017 0745 11-Jan-2017 0800  BP:   (!) 120/54   Pulse:  (!) 111 (!) 106 (!) 103  Resp:  18 18 18   Temp:    (!) 97.3 F (36.3 C)  TempSrc:    Oral  SpO2:  99% 100% 100%  Weight: 137 lb 2 oz (62.2 kg)     Height:        HEMODYNAMICS:    VENTILATOR SETTINGS: Vent Mode: PRVC FiO2 (%):  [40 %] 40 % Set Rate:  [18 bmp] 18 bmp Vt Set:  [460 mL-463 mL] 463 mL PEEP:  [8 cmH20] 8 cmH20 Plateau Pressure:  [16 cmH20-24 cmH20] 22 cmH20  INTAKE / OUTPUT: I/O last 3 completed shifts: In: 5187 [I.W.:9798; Other:300; IV Piggyback:750] Out: 2085 [Urine:2085]  PHYSICAL EXAMINATION: General: critically ill appearing male in NAD on vent  HEENT: MM pink/moist, ETT Neuro: occasionally will open eyes, not to command, rightward gaze, no response to painful stimuli, +gag, +corneals, does have respiratory effort on PSV (low Vt - 19ml and increased rate) CV: s1s2 rrr, no m/r/g PULM: even/non-labored, lungs bilaterally coarse  XQ:JJHE, non-tender, bsx4 active  Extremities: warm/dry, trace generalized edema  Skin: no rashes or lesions.  R upper chest wall dressing c/d/i   LABS:  BMET Recent Labs  Lab 12/16/16 0358 12/19/16 0238 12/20/16 0411 11-Jan-2017 0500  NA 135 136  --  146*  K 3.9 3.5  --  2.7*  CL 98* 101  --  113*  CO2 30 28  --  25  BUN 12 13  --  15  CREATININE 0.63 0.59* 0.58* 0.55*  GLUCOSE 119* 133*  --  104*    Electrolytes Recent Labs  Lab 12/16/16 0358 12/19/16 0238 23-Dec-2016 0500  CALCIUM 9.6 8.5* 9.4  MG  --  1.7  --   PHOS  --  3.3  --     CBC Recent Labs  Lab 12/18/16 0321 12/19/16 0238 December 23, 2016 0500  WBC 15.4* 18.8* 14.2*  HGB 7.9* 6.7* 7.7*  HCT 24.6* 19.7* 23.1*  PLT 365 252 298    Coag's Recent Labs  Lab 12/19/16 0238  INR 1.25     Sepsis Markers Recent Labs  Lab 12/19/16 0238 12/19/16 0305 12/19/16 0615 12/20/16 0411 12/21/16 0337  LATICACIDVEN  --  2.2* 2.2*  --   --   PROCALCITON 0.38  --   --  0.64 0.46    ABG Recent Labs  Lab 12/19/16 0255  PHART 7.329*  PCO2ART 55.8*  PO2ART 113*    Liver Enzymes Recent Labs  Lab 12/19/16 0238  AST 39  ALT 25  ALKPHOS 106  BILITOT 0.6  ALBUMIN 1.6*    Cardiac Enzymes Recent Labs  Lab 12/19/16 0238 12/19/16 0615  TROPONINI 0.04* 0.04*    Glucose Recent Labs  Lab 12/19/16 1211 12/20/16 0042 12/20/16 0732 12/20/16 1127 12/20/16 1602 12/21/16 0317  GLUCAP 112* 107* 90 85 81 91    Imaging No results found.   CERVICAL LYMPH NODE BIOPSY 05/18/16:  Squamous cell carcinoma   PET CT 06/08/16: Supraglottic laryngeal primary with left greater than right cervical and left supraclavicular nodal metastasis. Hypermetabolic right lower lobe pulmonary nodule is favored to represent a synchronous primary bronchogenic neoplasm. Isolated pulmonary metastasis could look similar. No thoracic nodal hypermetabolism. Soft tissue hypermetabolism about the right iliac bone and greater trochanter of the proximal right femur are favored to be degenerative or posttraumatic. Soft tissue metastasis felt highly unlikely. Coronary artery atherosclerosis. Aortic atherosclerosis. A tiny right lower lobe pulmonary nodule is most likely a subpleural lymph node.  CT CHEST W/ CONTRAST 10/1: Interim development of multiple enlarged mediastinal and hilar lymph nodes consistent with metastatic disease. Development of multiple bilateral pulmonary nodules, also suspicious for metastatic disease. Suspect that the previously noted right lower lobe lung mass has increased in size, now measuring about 26 mm compared with 19 mm previously. New small right-sided pleural effusion. Patchy consolidations in the right lower lobe, uncertain if this represents pneumonia, aspiration, or metastatic  nodules. Subcentimeter hypodense foci in the liver, too small to further characterize. When the patient is clinically stable and able to follow directions and hold their breath (preferably as an outpatient) further evaluation with dedicated abdominal MRI may be considered.  PORT CXR 10/24:  Personally reviewed by me. Persistent right basilar opacification with silhouetting of hemidiaphragm & fullness to right hilum. Streaky opacification with some suggestion of air bronchogram and consolidation within the left lower lung as well. Unable to appreciate pleural effusion meniscus. Right-sided port in place.  Echo 10/30 > LVEF 65-70%, trivial pericardial effusion. No vegetation noted.   CT chest 11/2: Large right pleural effusion again noted but now appears loculated. Compressive atelectasis of the right lower lung. Enlarging moderate to large left pleural effusion with left lower lung atelectasis. 2 adjacent 2.7 cm hypodense lesions within the hepatic dome, unchanged from 12/06/2016 but  new since 11/13/2016. Although this could represent rapidly progressive metastatic disease, infection is not excluded given patient history. Pulmonary, mediastinal/hilar lymphatic and left eleventh rib metastases as described. T9 vertebral body lesion, new since 11/13/2016. This probably represents rapid development of a metastasis as this would be an unusual appearance of infection.  ECHO 11/6 >> LVEF 65-70%, no wall motion abnormalities, grade 2 diastolic dysfunction, calcified aortic valve but cannot rule out small vegetation on the valve   EEG 11/6 >> diffuse cerebral dysfunction that is non-specific in etiology. Body jerks noted are not associated with epileptiform correlate, no electrographic seizures in this study.   MICROBIOLOGY:  Blood Cultures x2 10/22 >> MSSA Sputum Culture 10/22 >>> consistent with normal respiratory flora Group A Strep Culture 10/23 >>> negative MRSA PCR 10/23:  Negative Blood Culture x1  10/24 >> negative  R Chest Wound 10/24 >> abundant staph >> pansensitive R Pleural Fluid 10/25 >> WBC 1,234, 97% neutrophil, LD 388, total protein <3.   R Pleural Culture 10/25 >> no growth R Pleural Cytology 10/25 >> acute reactive and mesothelial cells.  No malignant cells identified Blood Cx 11/2 >> negative Pleural fluid L 11/3 >> negative  Pleural fluid R 11/4 >> negative Sputum 11/6 >> moderate pseudomonas aeruginosa >> S- cefepime   ANTIBIOTICS: Zosyn 10/22 (x1 dose) Vancomycin 10/22 (x1 dose) Cefepime 10/23 (x2 doses) Ancef 10/23 >>> 11/5; 11/6 >> 11/8 Flagyl 11/5 >> 11/8 Vanco 11/5 >> 11/6 Zosyn 11/5 >> 11/6 Cefepime 11/8 (MSSA bacteremia, Pseudomonas sputum) >>   SIGNIFICANT EVENTS  10/22 - Admitted 10/23 - Transferred to SDU w/ repeat episode hypotension 10/25 - Thoracentesis, R side with 1.5 L bloody/cloudy fluid removed  16/10 - asystolic arrest with concern for myoclonus.  EEG with diffuse cerebral dysfunction, no seizure 11/08 - neuro exam unchanged, switched to propofol to allow for neuro exam 11/09 - repeat CT head  DISCUSSION: 61 y.o. male with stage IV metastatic squamous cell carcinoma. Previous imaging indicated evolving metastatic disease of the lungs. Patient admitted with sepsis secondary to MSSA bacteremia.  On ancef for 28 day course, Port-A-Cath removed. Hospital course complicated by ongoing fevers and pleural effusions. Now complicated by asystolic cardiac arrest 96/0.  ASSESSMENT / PLAN:  PULMONARY A: Acute hypoxemic respiratory failure HCAP  Bilateral Pleural Effusion - s/p thora x3, exudative P:   PRVC 8 cc/kg  Mental status barrier to SBT   CARDIOVASCULAR A:  Asystolic Cardiac Arrest - approximately 9 minutes of CPR, told his sister yesterday that he was "tired" HTN Shock, multifactorial due to sedation, probably cardiogenic and septic components Questionable vegetation on aortic valve on TTE P:  Hold home antihypertensives  TTE as  above   RENAL A:   Hypokalemia  P:   Intermittent BMP  Replace K   GASTROINTESTINAL A:   Dysphagia s/p PEG  GERD  P:   NPO  Pepcid for SUP  Hold TF   HEMATOLOGIC / ONC A:   Stage IV Squamous Cell Carcinoma of Head / Neck, widely metastatic on palliative chemotherapy  P:  Trend CBC  [name] following, appreciate input  INFECTIOUS A:   Shock - presumed septic with recent MSSA bacteremia  MSSA Bacteremia - s/p port removal  HCAP  Fevers  ? RLL HCAP - pseudomonas grew from sputum 11/6   P:   Follow cultures  ABX as above, appreciate pharmacy assistance  Follow fever curve / WBC trend   ENDOCRINE A:   At Risk Hyper/Hypoglycemia   P:  Monitor glucose on BMP   NEUROLOGIC A:   Acute Anoxic Encephalopathy  Myoclonus s/p Cardiac Arrest  P:   RASS goal: 0 Propofol for myoclonus if needed  Hold sedation am 11/9 to allow for neuro exam, CT imaging    FAMILY  - Updates:  No family available am 11/9.  Pending CT head and afternoon meeting with family once completed.  Very concerned for poor prognosis in setting of asystolic arrest in the setting of widely metastatic stage IV squamous cell carcinoma of the head / neck involving the mediastinum, lung & liver.  Cancer had progressed on most recent imaging per Dr [name].  - Inter-disciplinary family meet or Palliative Care meeting due by:  Ongoing  CC Time:  49 minutes   Noe Gens, NP-C [facility] Pgr: [phone] or if no answer [phone] January 20, 2017, 9:53 AM

## 2017-01-13 NOTE — Progress Notes (Signed)
Called back by RN who states family is ready to withdrawal care.  I called June on her phone and spoke with her and the family via speaker phone.  They are all in agreement to proceed with removal of mechanical ventilation.  We reviewed the process for ETT removal and medications to help keep Jasn comfortable if needed during the process.  Called the RN (Sapna) back and spoke with her regarding the plan of care.  I also called the RT to review plan / answer any questions.    Plan: Proceed with extubation  Full DNR / DNI  Comfort focused care  PRN morphine as needed for pain / shortness of breath Morphine gtt if needed / evidence of discomfort. Family updated, supportive care provided    Noe Gens, NP-C [facility] Pgr: [phone] or if no answer [phone] 01-13-17, 5:34 PM

## 2017-01-13 NOTE — Progress Notes (Signed)
Order to terminally wean pt. Initiated wean with vent settings of PS of 20 and Peep of 5. Process started at 1810. Slowly weaned to PS of 5 and Fi02 to 30%. The pt's RR remained under 30 BPM and pt appeared comfortable. The ETT was removed at 1835 to room air. RT and RN will continue to monitor.

## 2017-01-13 NOTE — Progress Notes (Signed)
Pt expired at 2058. Kennis Carina, RN and Jennye Moccasin, RN auscultated for two minutes and noted no heart sounds. Family members at bedside at pt time of death. RN notified E-Link MD, Dr. Vaughan Browner.

## 2017-01-13 NOTE — Plan of Care (Signed)
  Interdisciplinary Goals of Care Family Meeting   Date carried out:: 2017-01-01  Location of the meeting: Conference room  Member's involved: Nurse Practitioner and Family Member or next of kin  [place] or acting medical decision maker: Multiple Sisters (Antonio [name], Antonio [name], Antonio [name]), nieces and nephews involved in discussion (approximately 10-12 people present for discussion).    Discussion: We discussed goals of care for Antonio [name] .  We reviewed Antonio [name]'s hospitalization since admission, his underlying metastatic cancer & cardiac arrest.  We discussed the work up since his arrest to include ECHO, EEG, CT of the head and daily exams.  Unfortunately, Antonio [name]'s exam has not changed since his arrest.  He is obtunded, no response to painful stimuli and has a low minute ventilation on PSV (rate in high 30's with volumes of ~100).  He does have a positive gag/corneals.  I have not seen, nor has the bedside RN, any spontaneous purposeful movements.  He has been off sedation for the day. We reviewed that the mechanical ventilator at this point would be considered "life support" and that without it he would likely pass within hours to day.  They seemed to understand but also state "even though I hear it, I just cant believe it".  I have recommended to the family that they consider stopping use of mechanical ventilation as it is likely contributing to his discomfort and does not offer a chance for meaningful recovery.  Several family members spoke and stated that they felt he is "no longer here and would not want this kind of support".  I let the family have time to further discuss as I could sense they wanted to talk.  After discussion, I am not sure they will be able to make the decision to remove him from mechanical ventilation.  PCCM will continue to follow.     Code status: Full DNR  Disposition: Continue current acute care  Time spent for the meeting: 57 minutes     Antonio Gens,  NP-C [place] Pgr: [phone] or if no answer [phone] January 01, 2017, 3:59 PM

## 2017-01-13 NOTE — Progress Notes (Signed)
   12/31/16 1449  Clinical Encounter Type  Visited With Patient and family together  Visit Type Follow-up  Referral From Nurse  Consult/Referral To Chaplain  Stress Factors  Family Stress Factors Other (Comment) (Family meeting with Physicians to decide what is next.)   Family was with patient at bedside.  Offered hospitality for them and they did not need any other support at this time.  Nurse indicated that they will have a meeting with the Physician to discuss what may be next.  Will follow as needed. Chaplain Katherene Ponto

## 2017-01-13 DEATH — deceased

## 2017-01-18 ENCOUNTER — Other Ambulatory Visit: Payer: Self-pay | Admitting: Nurse Practitioner

## 2017-02-13 NOTE — Discharge Summary (Signed)
DISCHARGE SUMMARY    Date of admit: 12/05/2016  6:02 PM Date of discharge: 01-18-2017  8:58 PM Length of Stay: 18 days  PCP is No primary care provider on file.   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 patient with    has a past medical history of Allergy, Arthritis, Chronic cough, Chronic sinus infection, Colonic polyp, COPD (chronic obstructive pulmonary disease) ([place]), DJD (degenerative joint disease) of knee, Dysphagia, GERD (gastroesophageal reflux disease), Head and neck malignancy ([place]) (05/18/2016), High cholesterol, History of radiation therapy (06/28/16- 08/17/16), History of radiation therapy (06/28/16- 08/17/16), Hypertension, Rectal bleeding, Rectal polyp, Sickle cell trait ([place]), Tobacco abuse, and Varus deformity of knee.   has a past surgical history that includes Colonoscopy w/ biopsies and polypectomy (X 2); Multiple tooth extractions (~ 2008); IR GASTROSTOMY TUBE MOD SED (06/23/2016); IR FLUORO GUIDE PORT INSERTION RIGHT (06/23/2016); IR US Guide Vasc Access Right (06/23/2016); and IR REMOVAL TUN ACCESS W/ PORT W/O FL MOD SED (12/06/2016).   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 He has undergone radiation therapy,  palliative chemotherapy. Disease has progressed as of CT scan 11/13/16. Admitted with severe sepsis and found to have an MSSA bacteremia. His Port-A-Cath wasidentified as source andremoved on 12/06/16. Possible associated pneumonia, right greater than left pleural effusion. Status postthoracentesis on 10/25, then repeat bilateral thoracenteses on 11/4 (1.3L off on left, 60cc off on right), consistent with an exudate. Pleural fluid culture negativewith no malignant cells identified. He is being treated with continued ancef. Course complicated by persistent FUO, and pleural effusion. Repeat thora with no growth so far. Flagyl added for potential anaerobe coverage in pleural space.   11/6 early AM the patient suffered what is thought to be a respiratory > asystolic cardiac arrest.The floor nurse said she last checked on him at 12am at which time she was giving him his scheduled free water. She said he was asymptomatic at that time. At approx 1:10am, the patient's bed alarm went off and another nurse ran into the room to check on patient. He was found on the floor at that time but had a pulse and was moving. They helped get him back into bed. Shortly after getting him back into bed he became bradycardic then lost a pulse, with rhythm showing asystole (at time 1:15am). CPR initiated and ROSC achieved at 1:24am. Patient intubated by anesthesia at 1:30am then transferred to the ICU. At the time of my exam he is in shock with BP 60's/40's. Patient unresponsive and is having decerebrate posturing.   EVENTS  SIGNIFICANT EVENTS  10/22 - Admitted. Blood Cultures x2 10/22 >> MSSA 10/23 - Transferred to SDU w/ repeat episode hypotension Chest Wound 10/24 >> abundant staph >>  pansensitive  10/25 - Thoracentesis, R side with 1.5 L bloody/cloudy fluid removed  88/64 - asystolic arrest with concern for myoclonus.  EEG with diffuse cerebral dysfunction, no seizure Sputum 11/6 >> moderate pseudomonas aeruginosa  >> S- cefepime   11/08 - neuro exam unchanged, switched to propofol to allow for neuro exam 11/09 - repeat CT head  Family discussion held 01/01/2017 and decision made to do terminal wean    Patient expired 01-Jan-2017     SIGNED Dr. Brand Males, M.D., F.C.C.P Pulmonary and Critical Care Medicine Staff Physician [facility] Pager: [phone], If no answer or between  15:00h - 7:00h: call [phone]  01/16/2017 3:57 PM

## 2018-06-30 IMAGING — DX DG CHEST 1V PORT
1 series · 1 of 1 positions shown · non-contrast
Comparison: 12/09/2016

CLINICAL DATA: Pleural effusion, shortness of Breath

EXAM:
PORTABLE CHEST 1 VIEW

[chest ap]
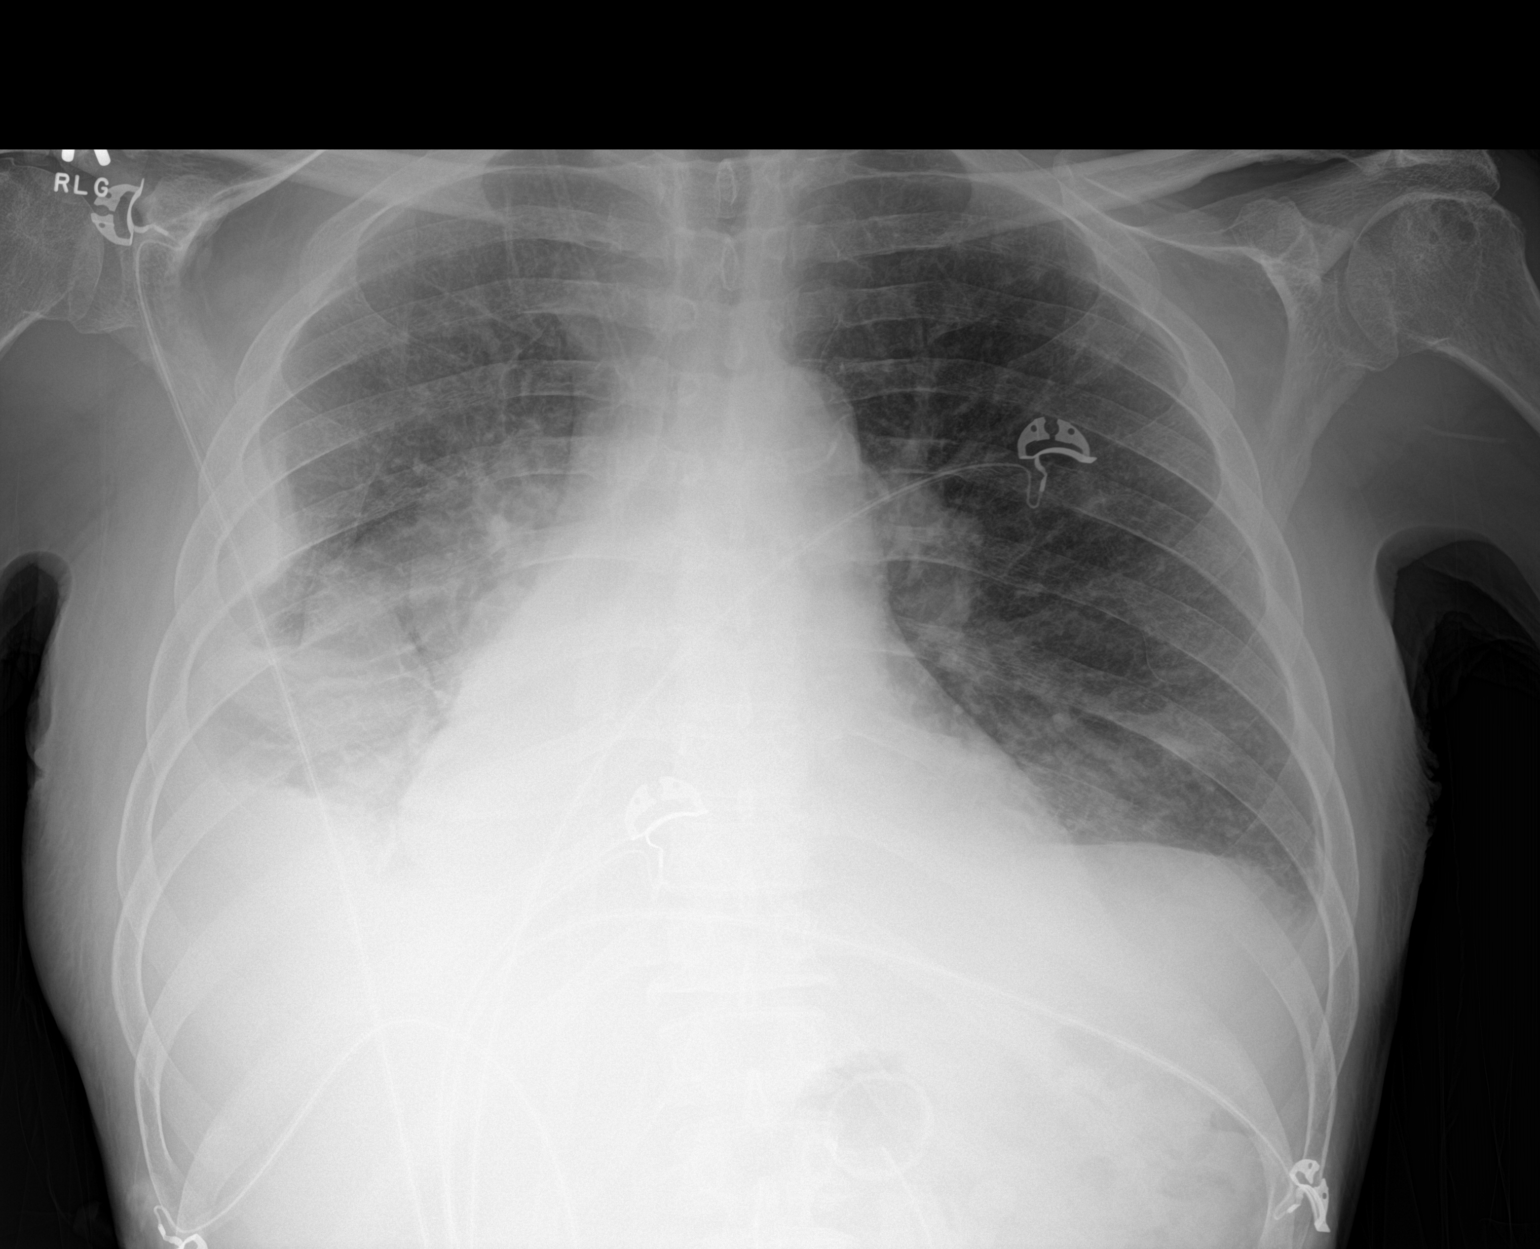

[1 of 1 positions shown; findings below may reference images not displayed]

FINDINGS: Cardiomegaly. Small right pleural effusion with vascular congestion.
Continued right lower lobe atelectasis or infiltrate and minimal
left base atelectasis. No acute bony abnormality.
IMPRESSION: Stable small right pleural effusion. Continued right lower lobe
atelectasis or infiltrate which appears slightly worsened since
prior study. Minimal left base atelectasis.

## 2018-07-04 IMAGING — CT CT CHEST W/ CM
2 of 3 series · 15 of 36 positions shown, 18 images · IV contrast (iopamidol)
Comparison: 12/06/2016 and 11/13/2016 chest CTs.

CLINICAL DATA: 61-year-old male with persistent shortness of
breath, pleural effusions, fevers and bacteremia. Patient with
metastatic supraglottic laryngeal cancer and lung metastases.

EXAM:
CT CHEST WITH CONTRAST
TECHNIQUE: Multidetector CT imaging of the chest was performed during
intravenous contrast administration.
CONTRAST:  75mL KAHROE-HUU IOPAMIDOL (KAHROE-HUU) INJECTION 61%

[Series 2: axial st · axial · 0.86mm/px · z∈[-283,-25]mm · 12 of 153 slices shown, 15 images]
[im 12/153  mediastinal]
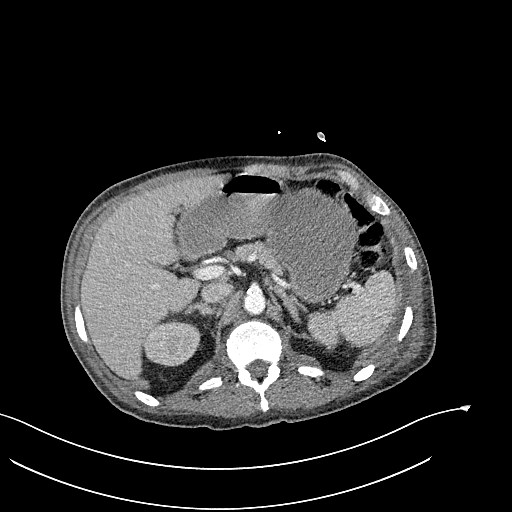
[im 12/153  lung]
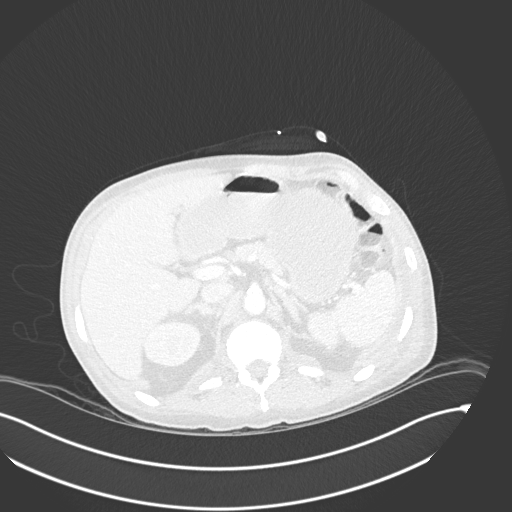
[im 23/153  lung]
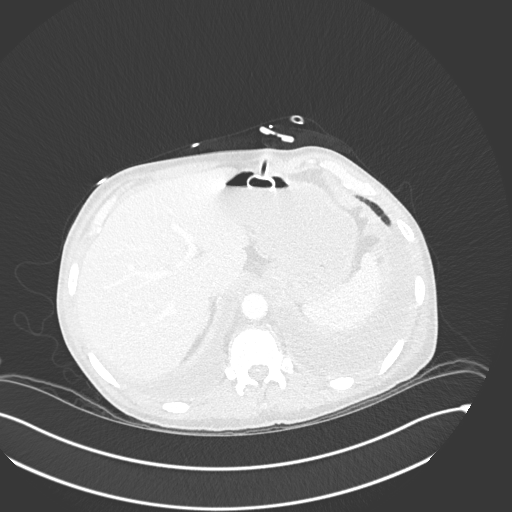
[im 34/153  lung]
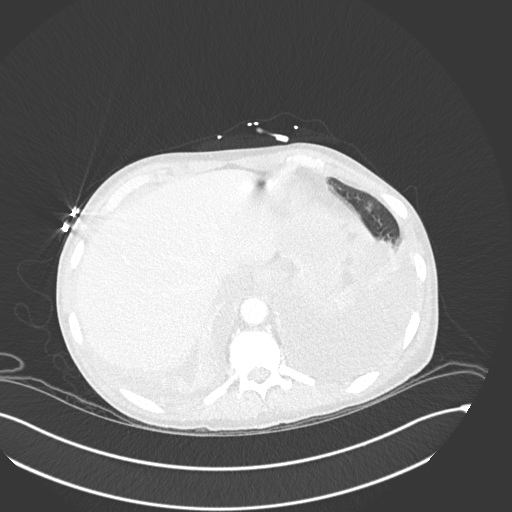
[im 46/153  lung]
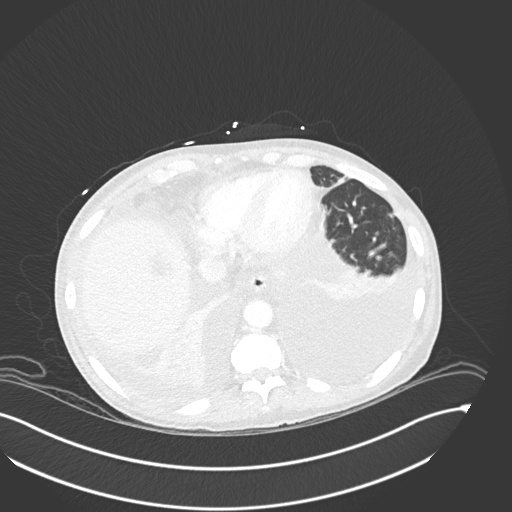
[im 57/153  mediastinal]
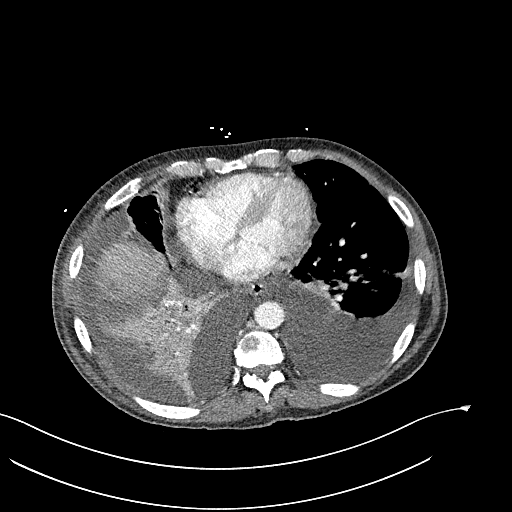
[im 57/153  lung]
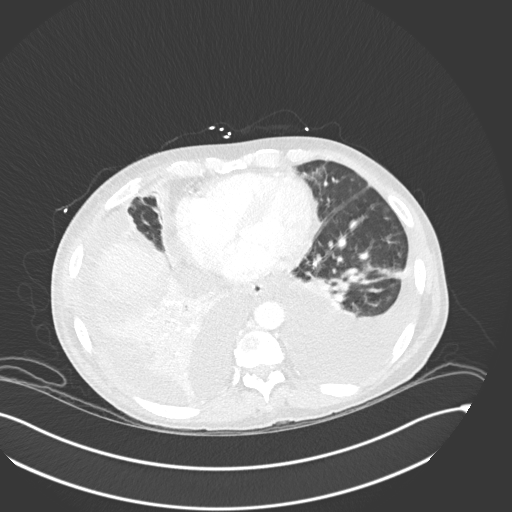
[im 68/153  lung]
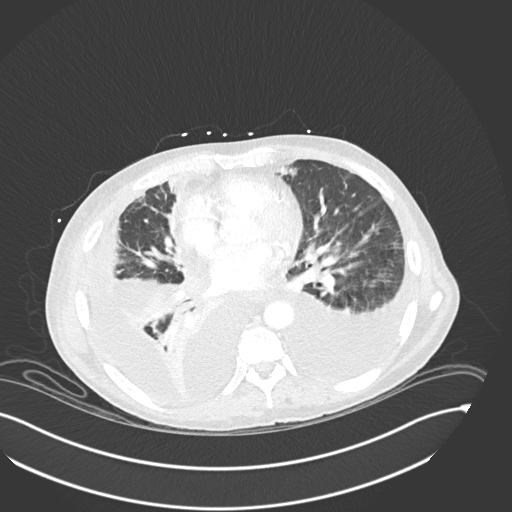
[im 85/153  lung]
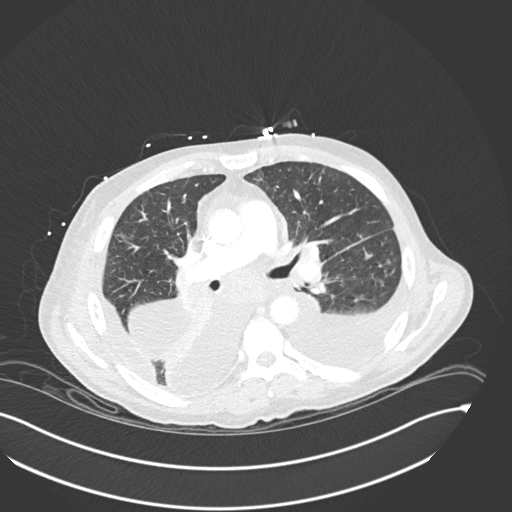
[im 96/153  lung]
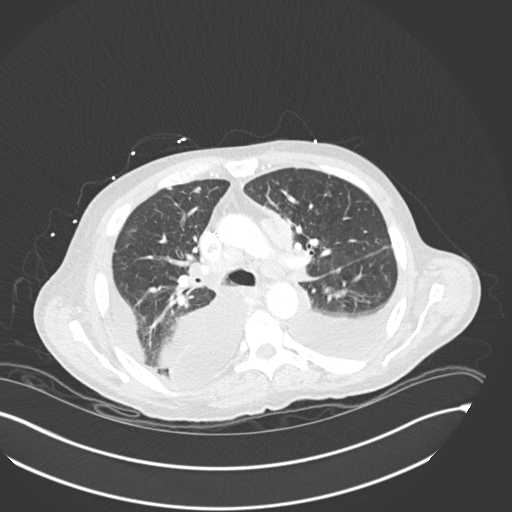
[im 107/153  mediastinal]
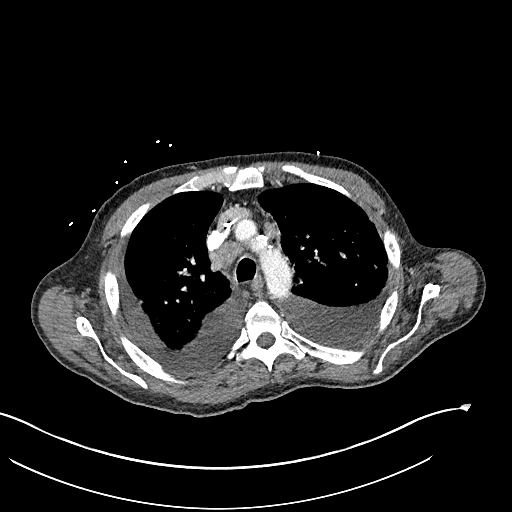
[im 107/153  lung]
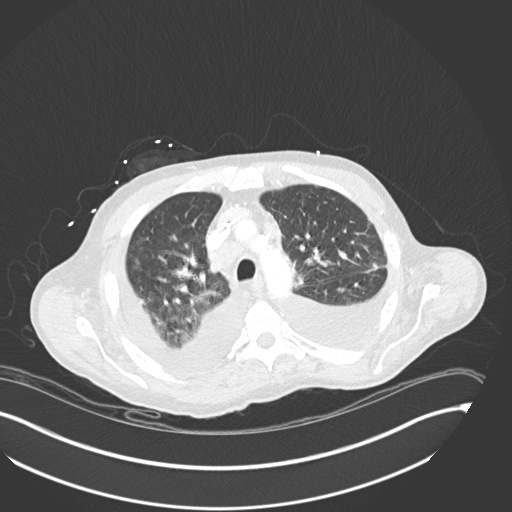
[im 119/153  lung]
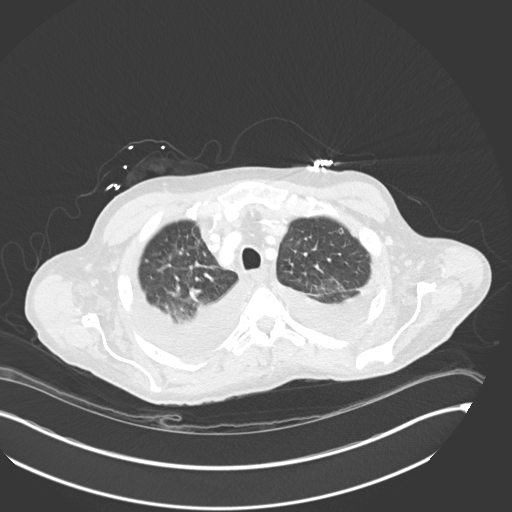
[im 130/153  lung]
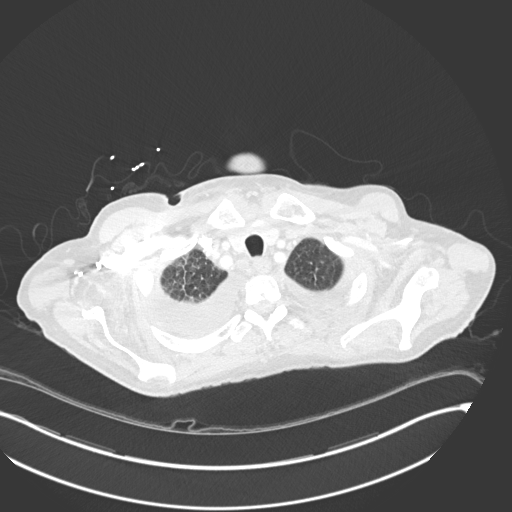
[im 141/153  lung]
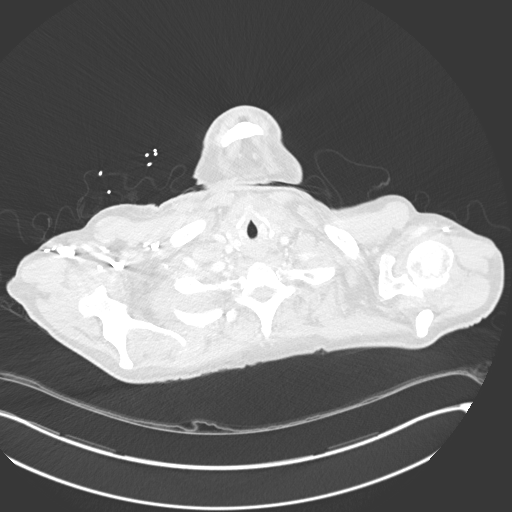

[Series 6: coronal · coronal · 0.62mm/px · 3 of 134 slices shown]
[im 27/134  lung]
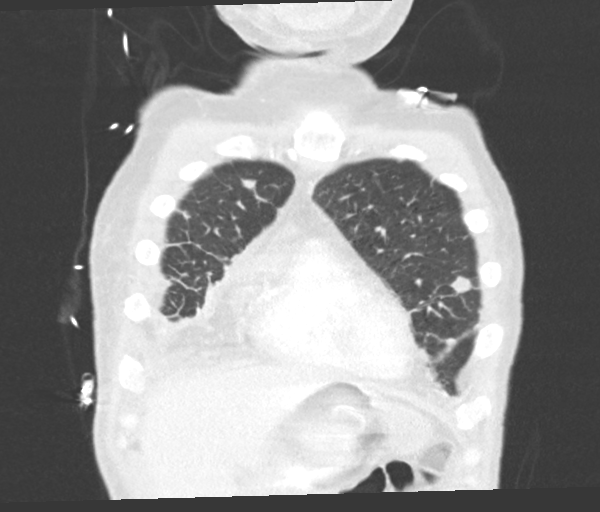
[im 54/134  lung]
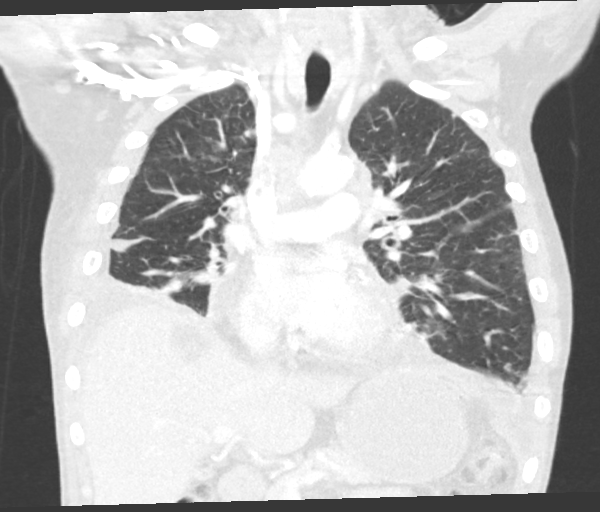
[im 80/134  lung]
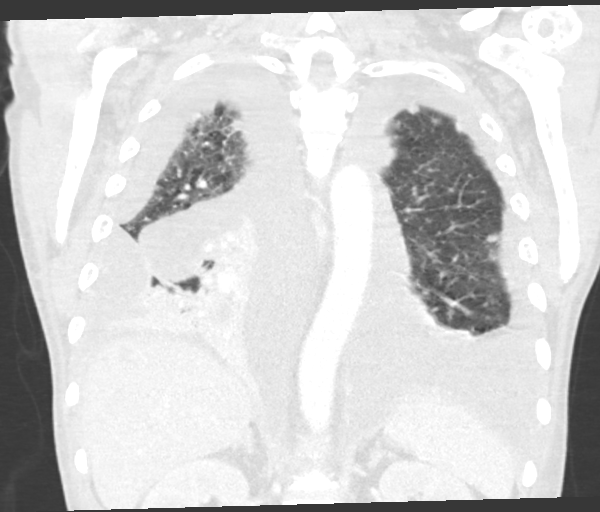

[15 of 36 positions shown; findings below may reference images not displayed]

FINDINGS: Cardiovascular: Normal heart size. Coronary artery calcifications
again identified. Thoracic aortic atherosclerotic calcifications
noted without aneurysm. No pericardial effusion. A left PICC line is
present with tip at the superior cavoatrial junction.

Mediastinum/Nodes: No significant change in enlarged bilateral
mediastinal and hilar lymph nodes. No other changes identified.

Lungs/Pleura: A large right pleural effusion is again noted but now
appears loculated. An enlarging moderate to large left pleural
effusion is noted. Compressive atelectasis of both lower lungs
noted, right greater than left. Multiple pulmonary nodules are again
identified and unchanged. Mild interlobular septal thickening is
nonspecific but may represent mild edema. There is no evidence of
pneumothorax

Upper Abdomen: Two adjacent 2.7 cm hypodense lesions within the
hepatic dome are unchanged from 12/06/2016 but new since 11/13/2016.
A gastrostomy tube is present.

Musculoskeletal: The superior aspect of a soft tissue mass involving
the posterolateral left eleventh rib is noted. A lytic lesion within
the T9 vertebral body new since 11/13/2016.
IMPRESSION: 1. Large right pleural effusion again noted but now appears
loculated. Compressive atelectasis of the right lower lung.
2. Enlarging moderate to large left pleural effusion with left lower
lung atelectasis.
3. 2 adjacent 2.7 cm hypodense lesions within the hepatic dome,
unchanged from 12/06/2016 but new since 11/13/2016. Although this
could represent rapidly progressive metastatic disease, infection is
not excluded given patient history.
4. Pulmonary, mediastinal/hilar lymphatic and left eleventh rib
metastases as described.
5. T9 vertebral body lesion, new since 11/13/2016. This probably
represents rapid development of a metastasis as this would be an
unusual appearance of infection.

## 2018-07-06 IMAGING — DX DG CHEST 1V PORT
1 series · 1 of 1 positions shown · non-contrast
Comparison: 12/16/2016

CLINICAL DATA: Status post thoracentesis

EXAM:
PORTABLE CHEST 1 VIEW

[chest ap]
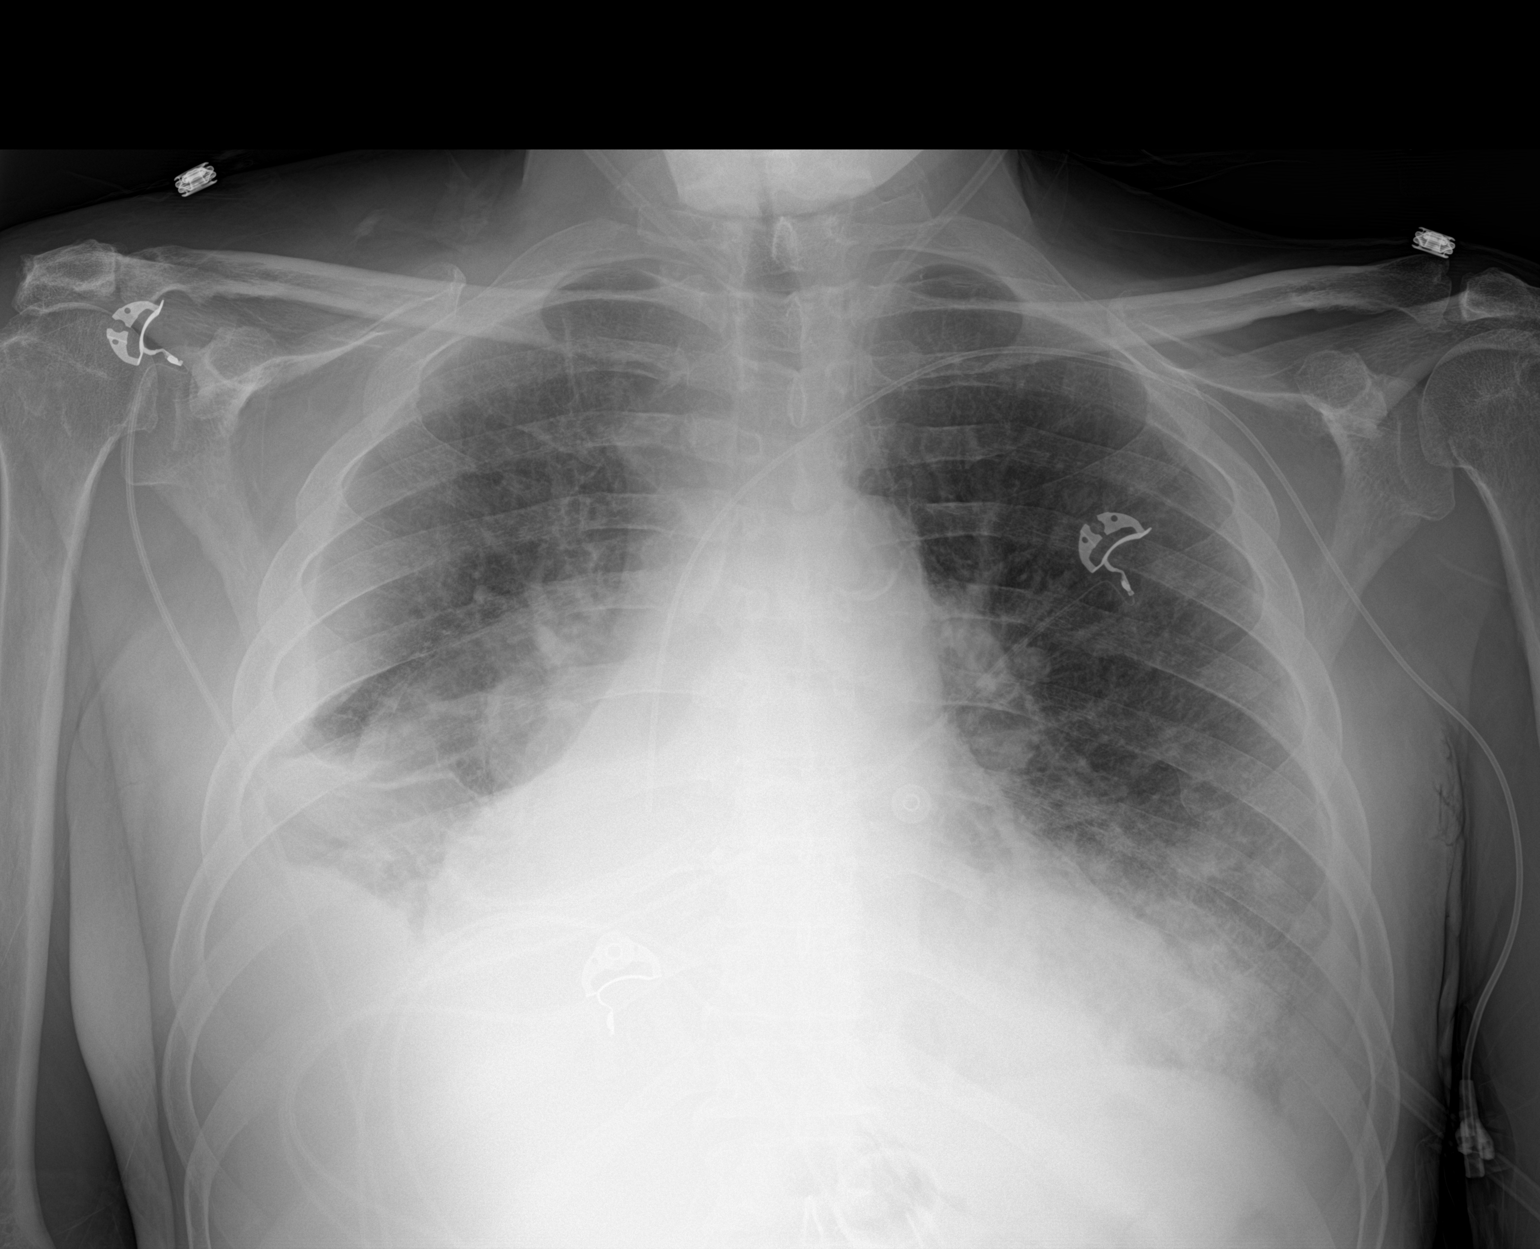

[1 of 1 positions shown; findings below may reference images not displayed]

FINDINGS: Stable left PICC. Stable right pleural effusion. No evidence of
pneumothorax. Increasing airspace disease at the left lung base.
Left pleural effusion is increased and is small.
IMPRESSION: No pneumothorax post right thoracentesis.

Increasing left effusion and basilar airspace disease.

## 2018-07-08 IMAGING — DX DG CHEST 1V PORT
1 series · 1 of 1 positions shown · non-contrast
Comparison: 12/17/2016, 12/16/2016

CLINICAL DATA: Post intubation

EXAM:
PORTABLE CHEST 1 VIEW

[chest ap]
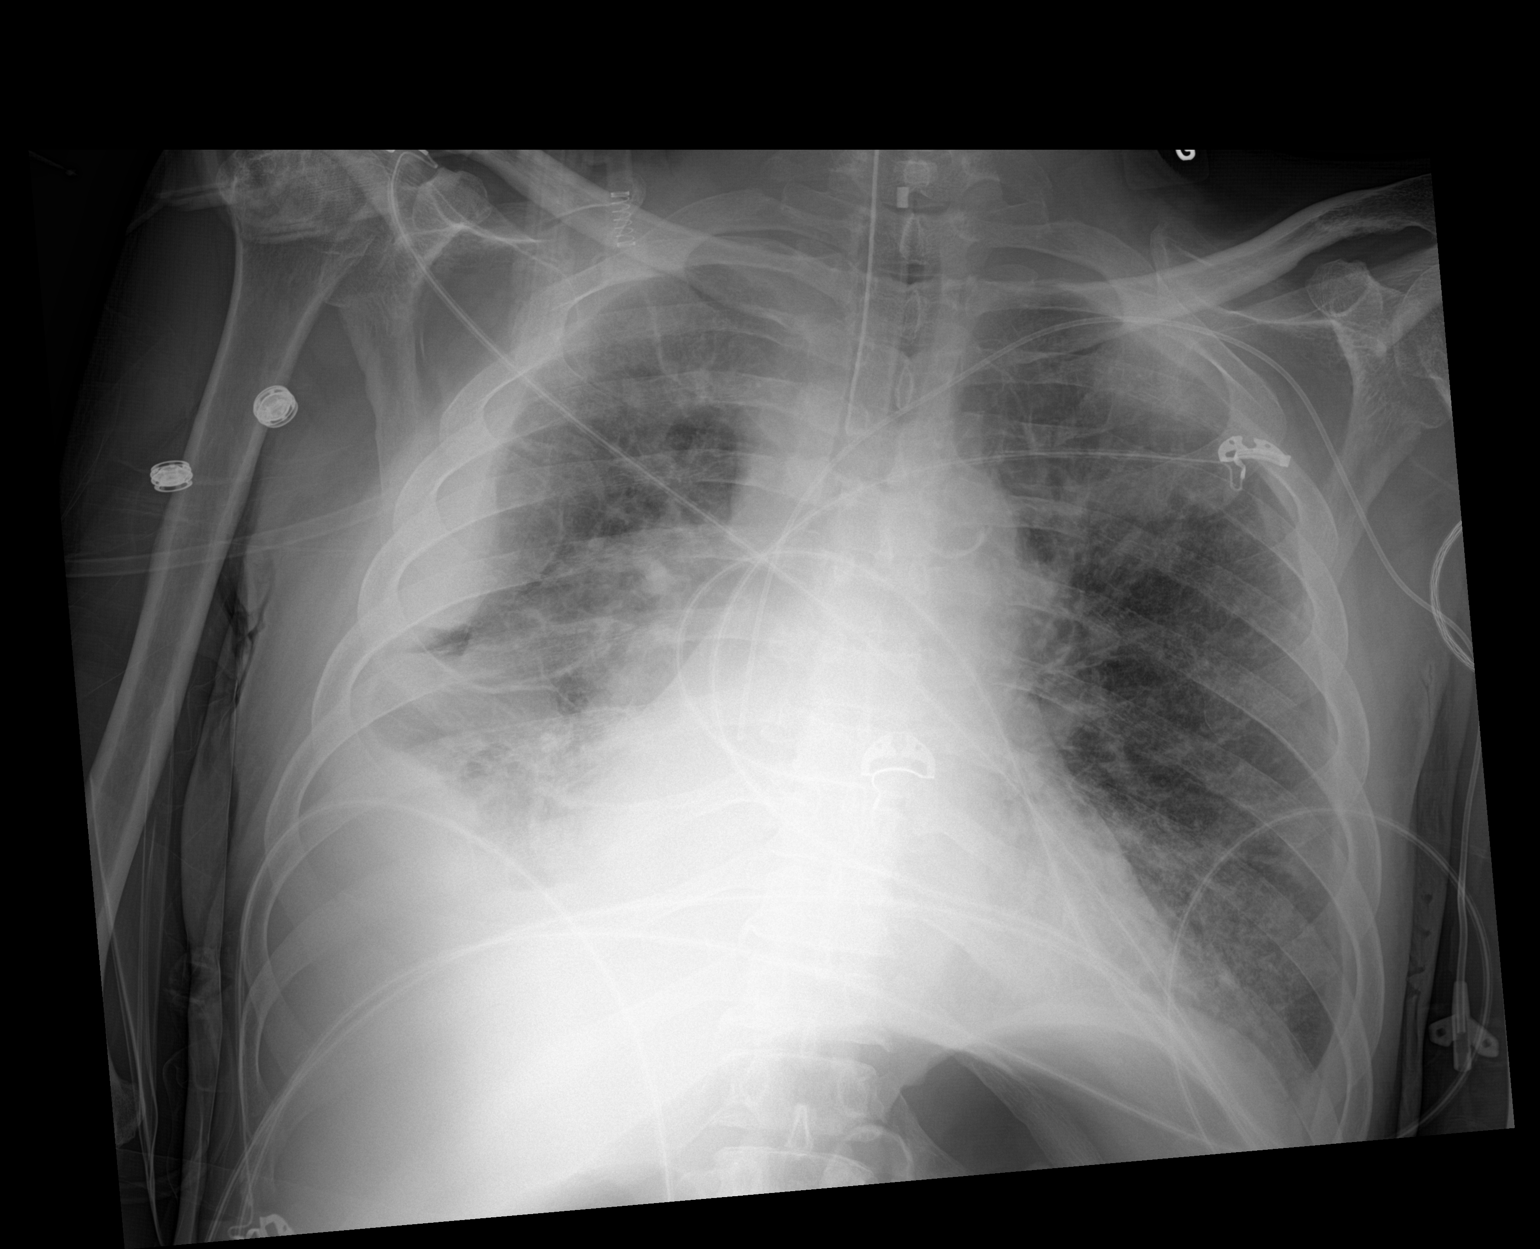

[1 of 1 positions shown; findings below may reference images not displayed]

FINDINGS: Endotracheal tube tip is about 3.9 cm superior to the carina. Left
upper extremity catheter tip overlies the cavoatrial junction.
Slight increased right-sided pleural effusion which appears
loculated laterally. Small left pleural effusion. Cardiomegaly with
central vascular congestion and mild diffuse interstitial edema. No
pneumothorax. Aortic atherosclerosis.
IMPRESSION: 1. Endotracheal tube tip about 3.9 cm superior to carina
2. Slight increased loculated appearing right pleural effusion.
Small left pleural effusion
3. Cardiomegaly with vascular congestion and mild pulmonary edema.
4. No change in right basilar consolidation.

## 2018-09-17 ENCOUNTER — Encounter: Payer: Self-pay | Admitting: *Deleted

## 2018-09-17 NOTE — Progress Notes (Signed)
Oncology Nurse Navigator Documentation  Flowsheet/spreadsheet update.  Rick Esdras Delair, RN, BSN Head & Neck Oncology Nurse Navigator [facility] [phone]   

## 2019-07-09 IMAGING — US US SOFT TISSUE EXCLUDE HEAD/NECK
1 series · 14 of 15 positions shown · non-contrast
Comparison: None.

CLINICAL DATA: 61-year-old male with recent right anterior chest
wall port removal. Evaluation for abscess.

EXAM:
ULTRASOUND OF HEAD/NECK SOFT TISSUES
TECHNIQUE: Ultrasound examination of the head and neck soft tissues was
performed in the area of clinical concern.

[Series 1: us soft tissue exclude head/neck · 0.07mm/px · 14 of 15 slices shown]
[im 1/15]
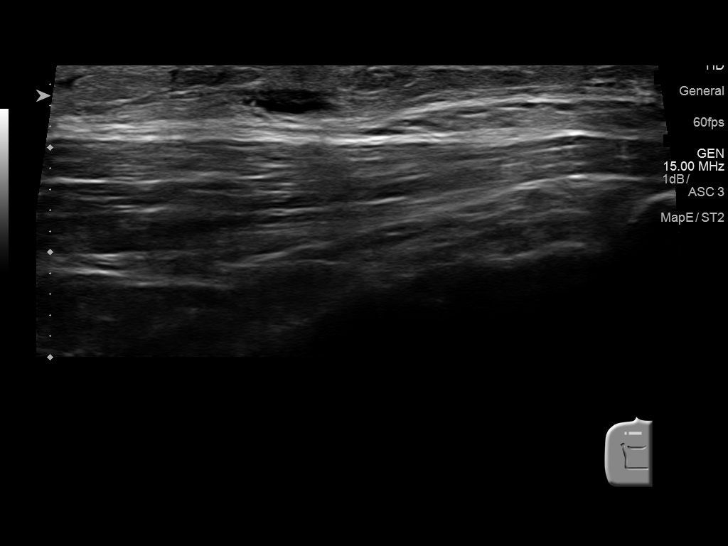
[im 2/15]
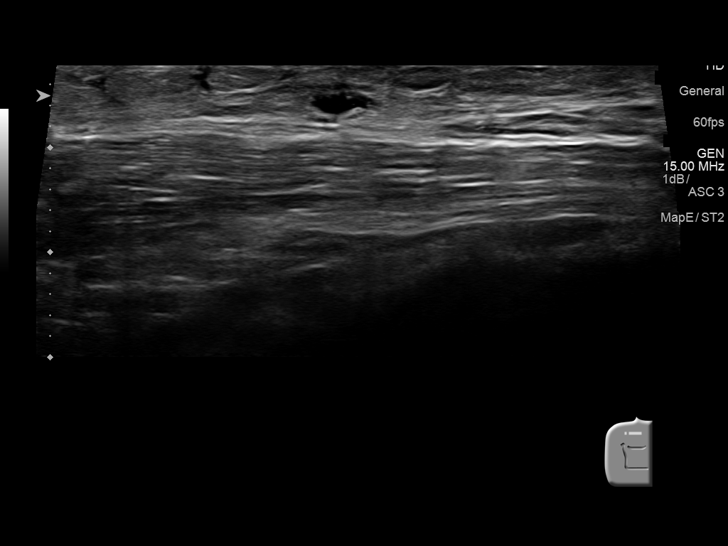
[im 3/15]
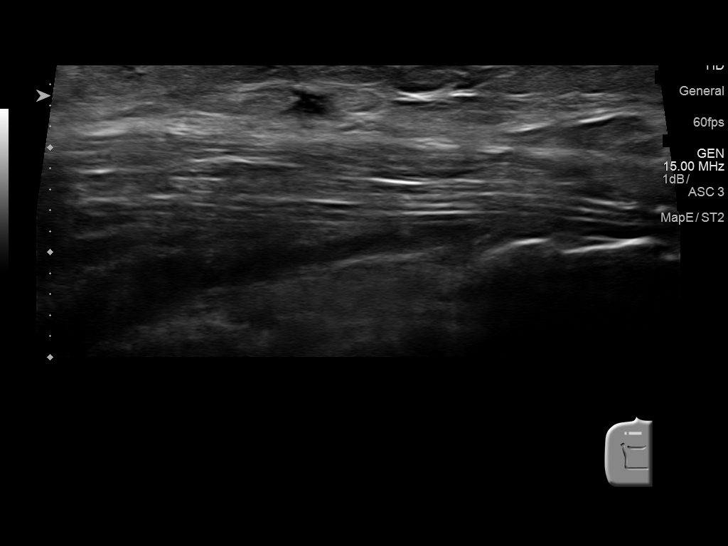
[im 4/15]
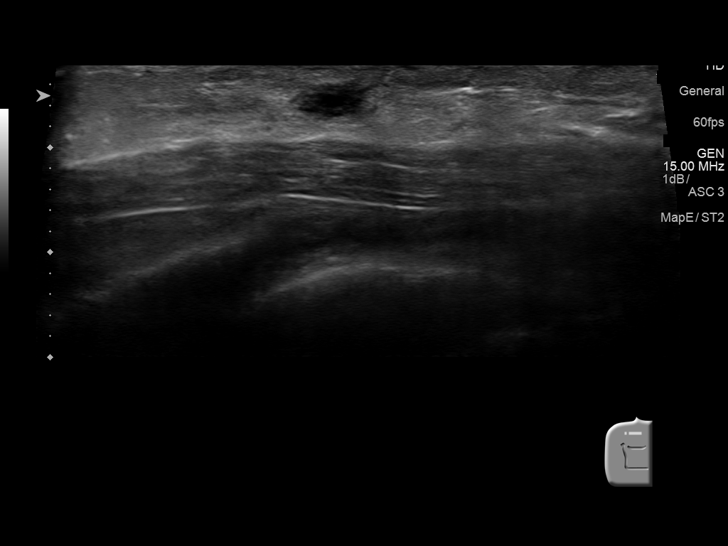
[im 5/15]
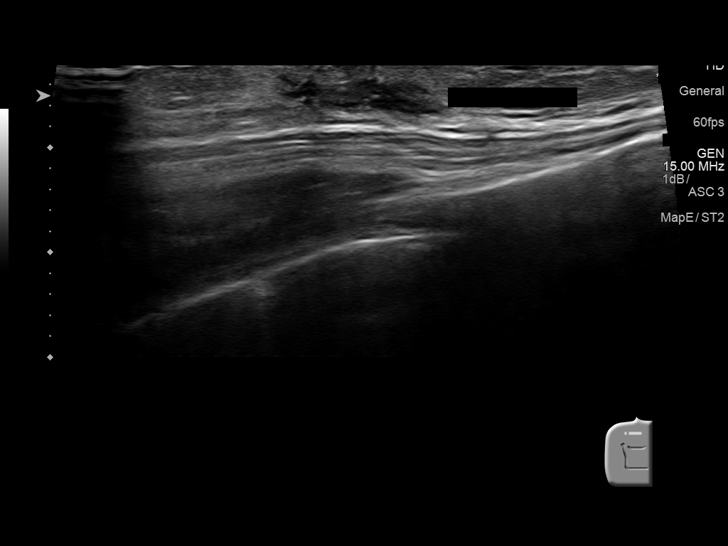
[im 6/15]
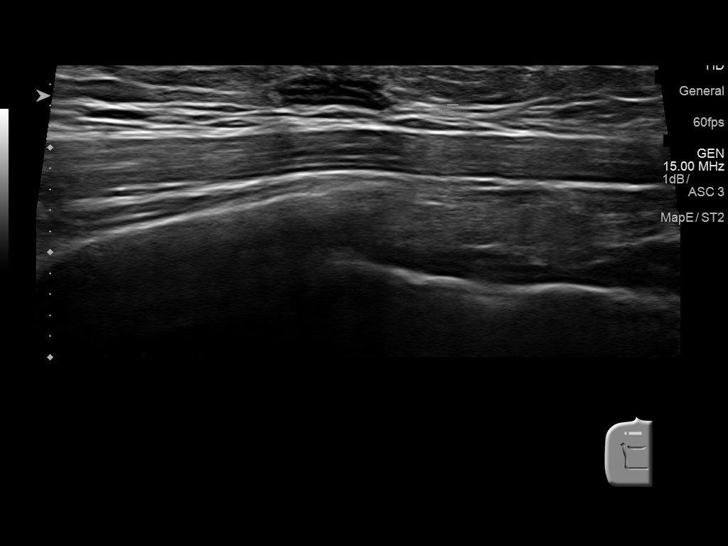
[im 7/15]
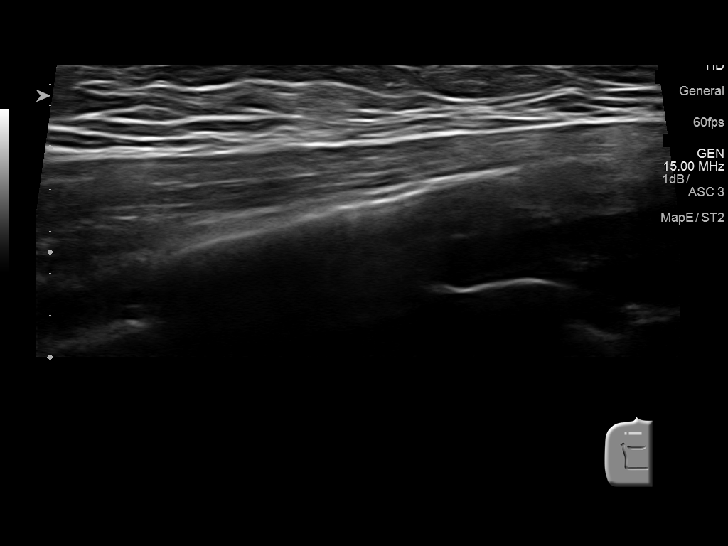
[im 9/15]
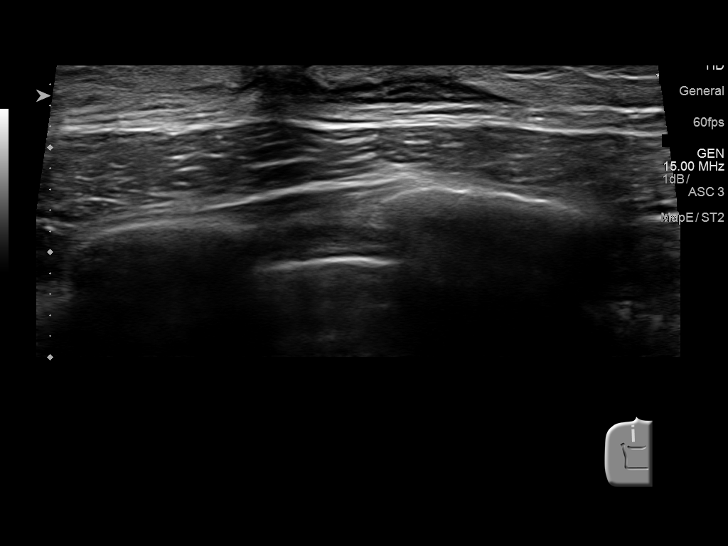
[im 10/15]
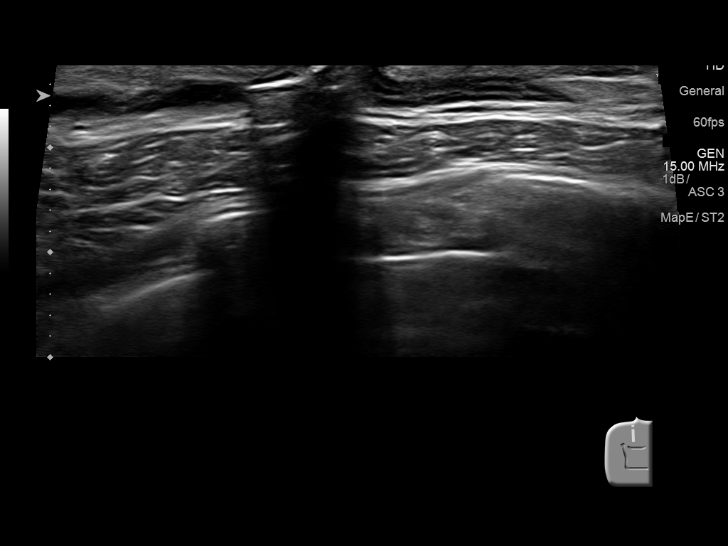
[im 11/15]
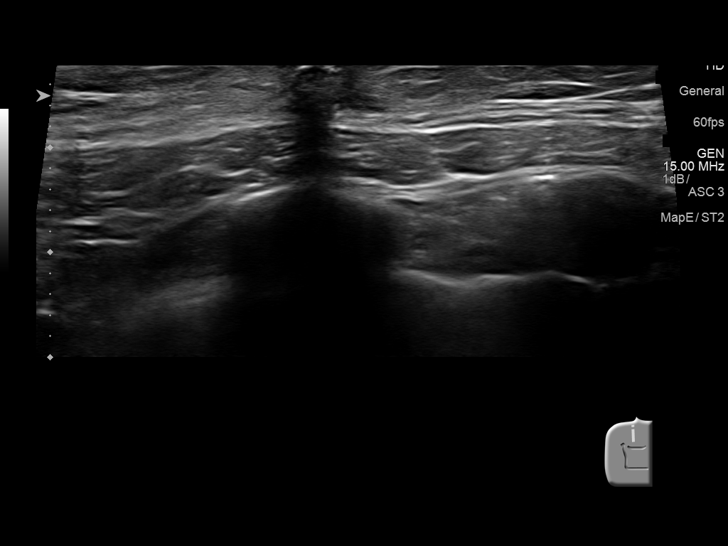
[im 12/15]
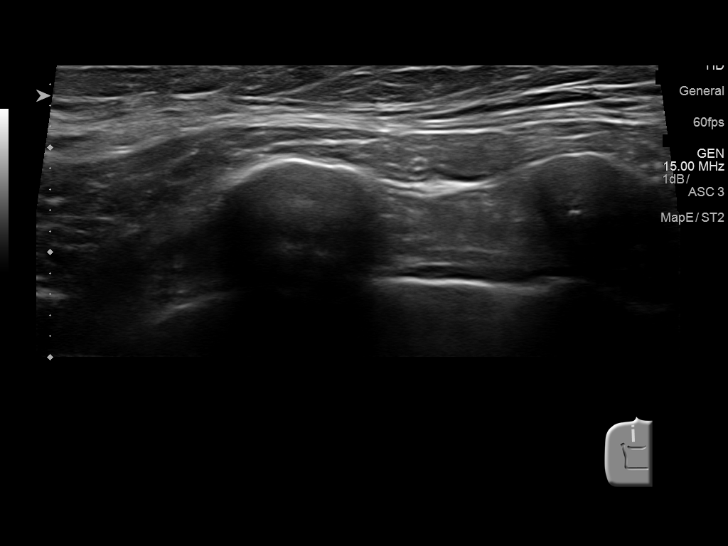
[im 13/15]
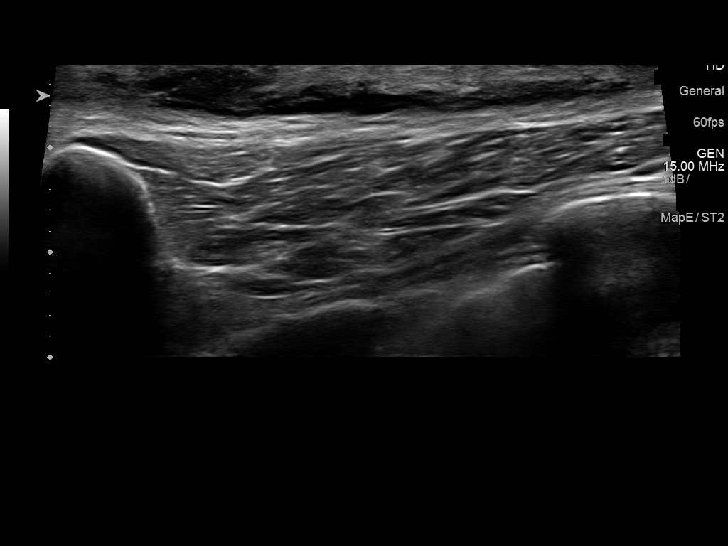
[im 14/15]
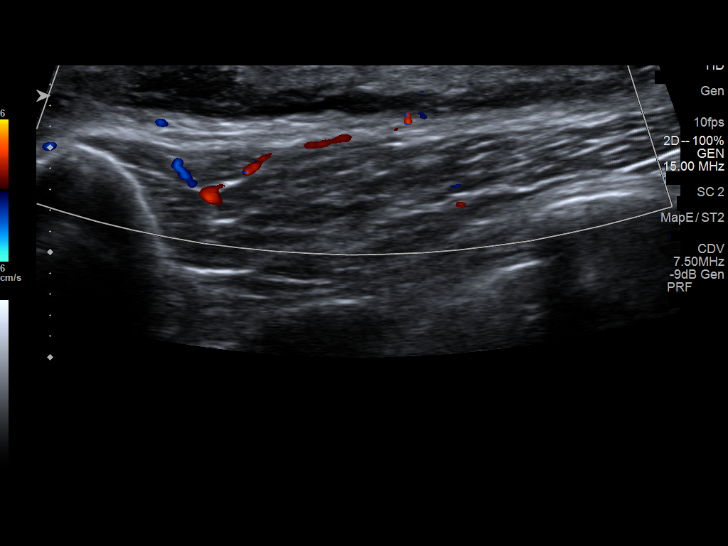
[im 15/15]
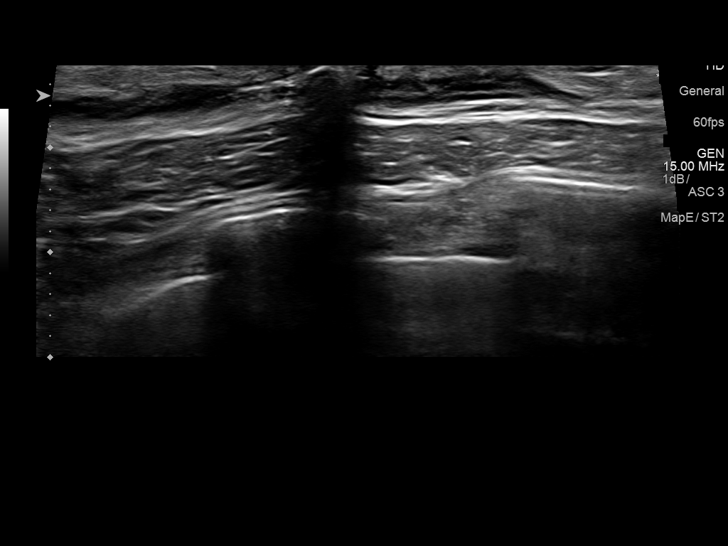

[14 of 15 positions shown; findings below may reference images not displayed]

FINDINGS: A draining skin wound is identified in the anterior right chest wall
in the region of the patient's clinical symptoms. The is a small
associated complex fluid collection measuring approximately 2.5 x
1.2 x 0.3 cm. This likely reflects the pocket in which the port was
contain. There is no significant internal or peripheral inflammatory
enhancement. No additional large abscess or drainable fluid
collection is identified.
IMPRESSION: 1. No definite abscess or drainable fluid collection with anterior
chest wall.
2. Draining skin wound with a small associated complex fluid
collection likely representing the pocket in which the recently
removed port was contained.
